# Patient Record
Sex: Male | Born: 1945 | Race: Black or African American | Hispanic: No | Marital: Married | Smoking: Former smoker
Health system: Southern US, Community
[De-identification: ages and names within clinical notes are randomized; demographics above are authoritative.]

## PROBLEM LIST (undated history)

## (undated) DIAGNOSIS — M2550 Pain in unspecified joint: Secondary | ICD-10-CM

## (undated) DIAGNOSIS — M549 Dorsalgia, unspecified: Secondary | ICD-10-CM

## (undated) DIAGNOSIS — E119 Type 2 diabetes mellitus without complications: Secondary | ICD-10-CM

## (undated) DIAGNOSIS — E785 Hyperlipidemia, unspecified: Secondary | ICD-10-CM

## (undated) DIAGNOSIS — J45909 Unspecified asthma, uncomplicated: Secondary | ICD-10-CM

## (undated) DIAGNOSIS — T7840XA Allergy, unspecified, initial encounter: Secondary | ICD-10-CM

## (undated) DIAGNOSIS — Z91018 Allergy to other foods: Secondary | ICD-10-CM

## (undated) DIAGNOSIS — R7303 Prediabetes: Secondary | ICD-10-CM

## (undated) DIAGNOSIS — Z87442 Personal history of urinary calculi: Secondary | ICD-10-CM

## (undated) DIAGNOSIS — M1990 Unspecified osteoarthritis, unspecified site: Secondary | ICD-10-CM

## (undated) DIAGNOSIS — G4730 Sleep apnea, unspecified: Secondary | ICD-10-CM

## (undated) DIAGNOSIS — I739 Peripheral vascular disease, unspecified: Secondary | ICD-10-CM

## (undated) DIAGNOSIS — I10 Essential (primary) hypertension: Secondary | ICD-10-CM

## (undated) DIAGNOSIS — R9439 Abnormal result of other cardiovascular function study: Secondary | ICD-10-CM

## (undated) HISTORY — DX: Unspecified asthma, uncomplicated: J45.909

## (undated) HISTORY — DX: Hyperlipidemia, unspecified: E78.5

## (undated) HISTORY — DX: Unspecified osteoarthritis, unspecified site: M19.90

## (undated) HISTORY — DX: Essential (primary) hypertension: I10

## (undated) HISTORY — DX: Allergy to other foods: Z91.018

## (undated) HISTORY — DX: Sleep apnea, unspecified: G47.30

## (undated) HISTORY — DX: Dorsalgia, unspecified: M54.9

## (undated) HISTORY — DX: Peripheral vascular disease, unspecified: I73.9

## (undated) HISTORY — DX: Pain in unspecified joint: M25.50

## (undated) HISTORY — DX: Prediabetes: R73.03

## (undated) HISTORY — DX: Allergy, unspecified, initial encounter: T78.40XA

---
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
## 2004-06-12 ENCOUNTER — Ambulatory Visit (HOSPITAL_COMMUNITY): Admission: RE | Admit: 2004-06-12 | Discharge: 2004-06-12 | Payer: Self-pay | Admitting: Internal Medicine

## 2004-11-30 ENCOUNTER — Ambulatory Visit: Payer: Self-pay | Admitting: Internal Medicine

## 2004-12-14 ENCOUNTER — Encounter (INDEPENDENT_AMBULATORY_CARE_PROVIDER_SITE_OTHER): Payer: Self-pay | Admitting: *Deleted

## 2004-12-14 ENCOUNTER — Ambulatory Visit: Payer: Self-pay | Admitting: Internal Medicine

## 2009-05-13 ENCOUNTER — Ambulatory Visit (HOSPITAL_COMMUNITY): Admission: RE | Admit: 2009-05-13 | Discharge: 2009-05-13 | Payer: Self-pay | Admitting: Internal Medicine

## 2009-05-18 ENCOUNTER — Ambulatory Visit (HOSPITAL_COMMUNITY): Admission: RE | Admit: 2009-05-18 | Discharge: 2009-05-18 | Payer: Self-pay | Admitting: Internal Medicine

## 2009-12-01 ENCOUNTER — Encounter (INDEPENDENT_AMBULATORY_CARE_PROVIDER_SITE_OTHER): Payer: Self-pay | Admitting: *Deleted

## 2010-03-23 ENCOUNTER — Ambulatory Visit: Payer: Self-pay | Admitting: Internal Medicine

## 2010-03-23 ENCOUNTER — Observation Stay (HOSPITAL_COMMUNITY): Admission: EM | Admit: 2010-03-23 | Discharge: 2010-03-25 | Payer: Self-pay | Admitting: Emergency Medicine

## 2010-03-24 ENCOUNTER — Encounter (INDEPENDENT_AMBULATORY_CARE_PROVIDER_SITE_OTHER): Payer: Self-pay | Admitting: Internal Medicine

## 2010-03-24 ENCOUNTER — Encounter (INDEPENDENT_AMBULATORY_CARE_PROVIDER_SITE_OTHER): Payer: Self-pay | Admitting: Emergency Medicine

## 2010-07-25 NOTE — Letter (Signed)
Summary: Colonoscopy Letter  [facility]  [address] [place], [place] [address]   Phone: [phone]  Fax: [phone]      December 01, 2009 MRN: [id number]   David Brandt [address] [place], [place]  [address]   Dear [name],   According to your medical record, it is time for you to schedule a Colonoscopy. The American Cancer Society recommends this procedure as a method to detect early colon cancer. Patients with a family history of colon cancer, or a personal history of colon polyps or inflammatory bowel disease are at increased risk.  This letter has beeen generated based on the recommendations made at the time of your procedure. If you feel that in your particular situation this may no longer apply, please contact our office.  Please call our office at [phone] to schedule this appointment or to update your records at your earliest convenience.  Thank you for cooperating with [name] to provide you with the very best care possible.   Sincerely,   Iva Boop, M.D.  [facility] Gastroenterology Division [phone]

## 2010-09-07 LAB — COMPREHENSIVE METABOLIC PANEL
ALT: 20 U/L (ref 0–53)
AST: 22 U/L (ref 0–37)
Albumin: 3.4 g/dL — ABNORMAL LOW (ref 3.5–5.2)
Alkaline Phosphatase: 82 U/L (ref 39–117)
BUN: 11 mg/dL (ref 6–23)
CO2: 23 mEq/L (ref 19–32)
Calcium: 8.7 mg/dL (ref 8.4–10.5)
Chloride: 105 mEq/L (ref 96–112)
Creatinine, Ser: 1.17 mg/dL (ref 0.4–1.5)
Glucose, Bld: 92 mg/dL (ref 70–99)
Potassium: 3.2 mEq/L — ABNORMAL LOW (ref 3.5–5.1)
Sodium: 135 mEq/L (ref 135–145)
Total Bilirubin: 0.5 mg/dL (ref 0.3–1.2)
Total Protein: 6.3 g/dL (ref 6.0–8.3)

## 2010-09-07 LAB — LIPASE, BLOOD: Lipase: 36 U/L (ref 11–59)

## 2010-09-07 LAB — URINALYSIS, ROUTINE W REFLEX MICROSCOPIC
Bilirubin Urine: NEGATIVE
Glucose, UA: NEGATIVE mg/dL
Ketones, ur: NEGATIVE mg/dL
Nitrite: NEGATIVE
Protein, ur: NEGATIVE mg/dL
Specific Gravity, Urine: 1.008 (ref 1.005–1.030)
Urobilinogen, UA: 1 mg/dL (ref 0.0–1.0)
pH: 6.5 (ref 5.0–8.0)

## 2010-09-07 LAB — DIFFERENTIAL
Basophils Absolute: 0 10*3/uL (ref 0.0–0.1)
Basophils Relative: 0 % (ref 0–1)
Eosinophils Absolute: 0.3 10*3/uL (ref 0.0–0.7)
Eosinophils Relative: 2 % (ref 0–5)
Lymphocytes Relative: 20 % (ref 12–46)
Lymphs Abs: 2.5 10*3/uL (ref 0.7–4.0)
Monocytes Absolute: 0.9 10*3/uL (ref 0.1–1.0)
Monocytes Relative: 7 % (ref 3–12)
Neutro Abs: 9.2 10*3/uL — ABNORMAL HIGH (ref 1.7–7.7)
Neutrophils Relative %: 71 % (ref 43–77)

## 2010-09-07 LAB — CBC
HCT: 47.9 % (ref 39.0–52.0)
HCT: 48.9 % (ref 39.0–52.0)
Hemoglobin: 16.1 g/dL (ref 13.0–17.0)
Hemoglobin: 16.4 g/dL (ref 13.0–17.0)
MCH: 30.1 pg (ref 26.0–34.0)
MCH: 30.3 pg (ref 26.0–34.0)
MCHC: 33.5 g/dL (ref 30.0–36.0)
MCHC: 33.6 g/dL (ref 30.0–36.0)
MCV: 89.5 fL (ref 78.0–100.0)
MCV: 90.2 fL (ref 78.0–100.0)
Platelets: 201 10*3/uL (ref 150–400)
Platelets: 207 10*3/uL (ref 150–400)
RBC: 5.35 MIL/uL (ref 4.22–5.81)
RBC: 5.42 MIL/uL (ref 4.22–5.81)
RDW: 14.9 % (ref 11.5–15.5)
RDW: 15.2 % (ref 11.5–15.5)
WBC: 12.9 10*3/uL — ABNORMAL HIGH (ref 4.0–10.5)
WBC: 13.8 10*3/uL — ABNORMAL HIGH (ref 4.0–10.5)

## 2010-09-07 LAB — URINE CULTURE
Colony Count: NO GROWTH
Culture  Setup Time: 201109290010
Culture: NO GROWTH

## 2010-09-07 LAB — TSH: TSH: 0.928 u[IU]/mL (ref 0.350–4.500)

## 2010-09-07 LAB — CARDIAC PANEL(CRET KIN+CKTOT+MB+TROPI)
CK, MB: 2.9 ng/mL (ref 0.3–4.0)
CK, MB: 3.8 ng/mL (ref 0.3–4.0)
Relative Index: 2.9 — ABNORMAL HIGH (ref 0.0–2.5)
Relative Index: 3.1 — ABNORMAL HIGH (ref 0.0–2.5)
Total CK: 100 U/L (ref 7–232)
Total CK: 122 U/L (ref 7–232)
Troponin I: 0.02 ng/mL (ref 0.00–0.06)
Troponin I: 0.03 ng/mL (ref 0.00–0.06)

## 2010-09-07 LAB — BASIC METABOLIC PANEL
BUN: 8 mg/dL (ref 6–23)
CO2: 24 mEq/L (ref 19–32)
Calcium: 8.5 mg/dL (ref 8.4–10.5)
Chloride: 109 mEq/L (ref 96–112)
Creatinine, Ser: 0.97 mg/dL (ref 0.4–1.5)
GFR calc Af Amer: >60 mL/min (ref >60)
GFR calc non Af Amer: >60 mL/min (ref >60)
Glucose, Bld: 90 mg/dL (ref 70–99)
Potassium: 3.9 mEq/L (ref 3.5–5.1)
Sodium: 139 mEq/L (ref 135–145)

## 2010-09-07 LAB — URINE MICROSCOPIC-ADD ON

## 2010-09-07 LAB — CK TOTAL AND CKMB (NOT AT ARMC)
CK, MB: 3.7 ng/mL (ref 0.3–4.0)
Relative Index: 2.5 (ref 0.0–2.5)
Total CK: 147 U/L (ref 7–232)

## 2010-09-07 LAB — POCT CARDIAC MARKERS
CKMB, poc: <1 ng/mL — ABNORMAL LOW (ref 1.0–8.0)
Myoglobin, poc: 98.1 ng/mL (ref 12–200)
Troponin i, poc: <0.05 ng/mL (ref 0.00–0.09)

## 2010-09-07 LAB — TROPONIN I: Troponin I: 0.01 ng/mL (ref 0.00–0.06)

## 2010-09-07 LAB — ETHANOL: Alcohol, Ethyl (B): 52 mg/dL — ABNORMAL HIGH (ref 0–10)

## 2010-09-07 LAB — MAGNESIUM: Magnesium: 2.1 mg/dL (ref 1.5–2.5)

## 2010-09-07 LAB — GLUCOSE, CAPILLARY: Glucose-Capillary: 96 mg/dL (ref 70–99)

## 2010-09-07 LAB — T4, FREE: Free T4: 0.95 ng/dL (ref 0.80–1.80)

## 2010-09-08 ENCOUNTER — Ambulatory Visit (HOSPITAL_COMMUNITY): Payer: 59

## 2010-09-08 ENCOUNTER — Ambulatory Visit (HOSPITAL_COMMUNITY)
Admission: RE | Admit: 2010-09-08 | Discharge: 2010-09-08 | Disposition: A | Discharge status: Home / Self Care | Payer: 59 | Source: Ambulatory Visit | Attending: Family Medicine | Admitting: Family Medicine

## 2010-09-08 ENCOUNTER — Other Ambulatory Visit: Payer: Self-pay | Admitting: Family Medicine

## 2010-09-08 ENCOUNTER — Inpatient Hospital Stay (INDEPENDENT_AMBULATORY_CARE_PROVIDER_SITE_OTHER)
Admission: RE | Admit: 2010-09-08 | Discharge: 2010-09-08 | Disposition: A | Discharge status: Home / Self Care | Payer: 59 | Source: Ambulatory Visit | Attending: Family Medicine | Admitting: Family Medicine

## 2010-09-08 DIAGNOSIS — R05 Cough: Secondary | ICD-10-CM | POA: Insufficient documentation

## 2010-09-08 DIAGNOSIS — R059 Cough, unspecified: Secondary | ICD-10-CM | POA: Insufficient documentation

## 2010-09-08 DIAGNOSIS — R0602 Shortness of breath: Secondary | ICD-10-CM | POA: Insufficient documentation

## 2010-09-08 DIAGNOSIS — J40 Bronchitis, not specified as acute or chronic: Secondary | ICD-10-CM

## 2010-09-10 ENCOUNTER — Inpatient Hospital Stay (INDEPENDENT_AMBULATORY_CARE_PROVIDER_SITE_OTHER)
Admission: RE | Admit: 2010-09-10 | Discharge: 2010-09-10 | Disposition: A | Discharge status: Home / Self Care | Payer: 59 | Source: Ambulatory Visit | Attending: Family Medicine | Admitting: Family Medicine

## 2010-09-10 ENCOUNTER — Ambulatory Visit (INDEPENDENT_AMBULATORY_CARE_PROVIDER_SITE_OTHER): Payer: 59

## 2010-09-10 DIAGNOSIS — J45909 Unspecified asthma, uncomplicated: Secondary | ICD-10-CM

## 2010-10-09 ENCOUNTER — Other Ambulatory Visit: Payer: Self-pay | Admitting: Internal Medicine

## 2010-10-09 ENCOUNTER — Ambulatory Visit (HOSPITAL_COMMUNITY)
Admission: RE | Admit: 2010-10-09 | Discharge: 2010-10-09 | Disposition: A | Discharge status: Home / Self Care | Payer: 59 | Source: Ambulatory Visit | Attending: Internal Medicine | Admitting: Internal Medicine

## 2010-10-09 DIAGNOSIS — J189 Pneumonia, unspecified organism: Secondary | ICD-10-CM | POA: Insufficient documentation

## 2010-10-09 DIAGNOSIS — J438 Other emphysema: Secondary | ICD-10-CM | POA: Insufficient documentation

## 2010-10-09 DIAGNOSIS — F17200 Nicotine dependence, unspecified, uncomplicated: Secondary | ICD-10-CM | POA: Insufficient documentation

## 2010-10-09 DIAGNOSIS — I10 Essential (primary) hypertension: Secondary | ICD-10-CM | POA: Insufficient documentation

## 2010-10-11 ENCOUNTER — Ambulatory Visit (HOSPITAL_BASED_OUTPATIENT_CLINIC_OR_DEPARTMENT_OTHER): Payer: 59 | Attending: Internal Medicine

## 2010-10-11 DIAGNOSIS — G4733 Obstructive sleep apnea (adult) (pediatric): Secondary | ICD-10-CM | POA: Insufficient documentation

## 2010-10-14 DIAGNOSIS — G4733 Obstructive sleep apnea (adult) (pediatric): Secondary | ICD-10-CM

## 2010-10-14 NOTE — Procedures (Signed)
NAME:  David Brandt, David Brandt NO.:  [id number]  MEDICAL RECORD NO.:  [id number]          PATIENT TYPE:  OUT  LOCATION:  SLEEP CENTER                 FACILITY:  [facility]  PHYSICIAN:  Isom Kochan D. Maple Hudson, MD, FCCP, FACPDATE OF BIRTH:  07-01-1947  DATE OF STUDY:  10/11/2010                           NOCTURNAL POLYSOMNOGRAM  REFERRING PHYSICIAN:  PRESTON S CLARK  INDICATION FOR STUDY:  Hypersomnia with sleep apnea.  EPWORTH SLEEPINESS SCORE:  3/24, BMI 34.6.  Weight 241 pounds, height 70 inches.  Neck 17 inches.  MEDICATIONS:  Home medications are charted and reviewed.  SLEEP ARCHITECTURE:  Split study protocol.  During the diagnostic phase, total sleep time 127 minutes with sleep efficiency 85.5%.  Stage I was 8.7%, stage II 82.7%, stage III absent, REM 8.7% of total sleep time. Sleep latency 5 minutes, REM latency 74 minutes, awake after sleep onset 16.5 minutes, arousal index 68.5.  Bedtime medication:  None.  RESPIRATORY DATA:  Split study protocol.  Apnea/hypopnea index (AHI) 115.7 per hour.  A total of 245 events was scored including 232 obstructive apneas, 12 mixed apneas, one hypopnea.  Events were not positional.  REM AHI 76.4.  CPAP was then titrated to 15 mL CWP, AHI 0.6 per hour.  He wore a medium ResMed Mirage Quattro full-face mask with heated humidifier.  OXYGEN DATA:  Before CPAP, snoring was moderate with oxygen desaturation to a nadir of 72% on room air.  With CPAP titration snoring was prevented and mean oxygen saturation held 92.5% on room air.  CARDIAC DATA:  Sinus rhythm.  MOVEMENT/PARASOMNIA:  No limb movement noted during the diagnostic phase.  During the titration phase, there were frequent limb jerks but very few associated with sleep disturbance.  Bathroom times one.  IMPRESSION/RECOMMENDATIONS: 1. Severe obstructive sleep apnea/hypopnea syndrome, AHI of 115.7 per     hour.  Non positional events.  Moderate snoring with oxygen     desaturation  to a nadir of 72% on room air. 2. Successful CPAP titration to 15 mL CWP, AHI 0.6 per hour.  He wore     a medium ResMed Mirage Quattro full-face mask with heated     humidifier.  Snoring was prevented and oxygenation normalized.     Hadley Soileau D. Maple Hudson, MD, [facility], FACP Diplomate, Biomedical engineer of Sleep Medicine Electronically Signed    CDY/MEDQ  D:  10/14/2010 09:21:45  T:  10/14/2010 19:21:33  Job:  [id number]

## 2010-11-16 ENCOUNTER — Encounter: Payer: Self-pay | Admitting: Pulmonary Disease

## 2010-11-28 ENCOUNTER — Encounter: Payer: Self-pay | Admitting: Pulmonary Disease

## 2010-11-28 ENCOUNTER — Ambulatory Visit (INDEPENDENT_AMBULATORY_CARE_PROVIDER_SITE_OTHER): Payer: 59 | Admitting: Pulmonary Disease

## 2010-11-28 VITALS — BP 140/80 | HR 83 | Temp 98.1°F | Ht 70.0 in | Wt 247.6 lb

## 2010-11-28 DIAGNOSIS — G4733 Obstructive sleep apnea (adult) (pediatric): Secondary | ICD-10-CM

## 2010-11-28 NOTE — Patient Instructions (Signed)
Will start on cpap and increase pressure over next 5 weeks. Please call me if having tolerance issues. Work on weight loss followup with me in 5 weeks

## 2010-11-28 NOTE — Progress Notes (Signed)
  Subjective:    Patient ID: David Brandt, male    DOB: 08-13-1945, 65 y.o.   MRN: [id number]  HPI The pt is a 65y/o male who I have been asked to see for management of osa.  He recently underwent npsg which revealed severe osa, with AHI 116/hr and optimal cpap pressure of 15cm.  His history is significant for: -noted to have loud snoring and abnormal breathing pattern during sleep -frequent awakenings and nonrestorative sleep -significant daytime sleepiness with periods of inactivity (falls asleep drinking coffee, with tv/movies) -50 pound weight gain last 2 yrs.   Sleep Questionnaire: What time do you typically go to bed?( Between what hours) 11 to 11:30pm How long does it take you to fall asleep? 20 mins How many times during the night do you wake up? What time do you get out of bed to start your day? 0800 Do you drive or operate heavy machinery in your occupation? No How much has your weight changed (up or down) over the past two years? (In pounds) 50 lb (22.68 kg) Have you ever had a sleep study before? Yes If yes, location of study? [facility] If yes, date of study? 09/2010 Do you currently use CPAP? No Do you wear oxygen at any time? No    Review of Systems  Constitutional: Negative for fever and unexpected weight change.  HENT: Positive for congestion. Negative for ear pain, nosebleeds, sore throat, rhinorrhea, sneezing, trouble swallowing, dental problem, postnasal drip and sinus pressure.   Eyes: Negative for redness and itching.  Respiratory: Positive for shortness of breath. Negative for cough, chest tightness and wheezing.   Cardiovascular: Negative for palpitations and leg swelling.  Gastrointestinal: Negative for nausea and vomiting.  Genitourinary: Negative for dysuria.  Musculoskeletal: Negative for joint swelling.  Skin: Negative for rash.  Neurological: Negative for headaches.  Hematological: Does not bruise/bleed easily.  Psychiatric/Behavioral: Negative for dysphoric mood.  The patient is not nervous/anxious.        Objective:   Physical Exam Constitutional: obese male, no acute distress  HENT:  Nares with deviated septum to left with near obstruction, enlarged turbinates.   Oropharynx without exudate, palate and uvula are very large posteriorly  Eyes:  Perrla, eomi, no scleral icterus  Neck:  No JVD, no TMG  Cardiovascular:  Normal rate, regular rhythm, no rubs or gallops.  No murmurs        Intact distal pulses  Pulmonary :  Normal breath sounds, no stridor or respiratory distress   No rales, rhonchi, or wheezing  Abdominal:  Soft, nondistended, bowel sounds present.  No tenderness noted.   Musculoskeletal:  No lower extremity edema noted.  Lymph Nodes:  No cervical lymphadenopathy noted  Skin:  No cyanosis noted  Neurologic:  Alert, but does appear mildly sleepy, moves all 4 extremities without obvious deficit.         Assessment & Plan:

## 2010-11-28 NOTE — Assessment & Plan Note (Addendum)
The pt has very severe osa by his recent sleep study, but had good control on appropriate cpap.  He is clearly symptomatic at night and during the day, with significant impact on QOL.  I have had a long discussion with the pt about sleep apnea, including its impact on QOL and CV health.  I have recommended treatment with cpap while working on weight loss, and he is agreeable.

## 2010-12-04 ENCOUNTER — Encounter: Payer: Self-pay | Admitting: Internal Medicine

## 2010-12-08 ENCOUNTER — Encounter: Payer: Self-pay | Admitting: Pulmonary Disease

## 2011-01-02 ENCOUNTER — Encounter: Payer: Self-pay | Admitting: Pulmonary Disease

## 2011-01-02 ENCOUNTER — Ambulatory Visit (INDEPENDENT_AMBULATORY_CARE_PROVIDER_SITE_OTHER): Payer: 59 | Admitting: Pulmonary Disease

## 2011-01-02 VITALS — BP 144/86 | HR 93 | Temp 98.2°F | Ht 70.0 in | Wt 249.4 lb

## 2011-01-02 DIAGNOSIS — G4733 Obstructive sleep apnea (adult) (pediatric): Secondary | ICD-10-CM

## 2011-01-02 NOTE — Assessment & Plan Note (Signed)
The pt is doing well on cpap, and we now need to get him to his therapeutic pressure.  He is having no issue with mask fit or pressure tolerance.  Will send an order to his dme to increase his cpap to 15cm.  I have encouraged him to work aggressively on weight loss, and will see him back in 6mos.

## 2011-01-02 NOTE — Patient Instructions (Signed)
Will have your pressure increased to 15. Work on weight loss followup with me in 6mos, but call if having issues.

## 2011-01-02 NOTE — Progress Notes (Signed)
  Subjective:    Patient ID: David Brandt, male    DOB: 05-20-1946, 65 y.o.   MRN: [id number]  HPI The pt comes in today for f/u of his known very severe osa.  He has been started on cpap, and is wearing compliantly.  He denies any issues with mask fit or pressure.  He is sleeping much better, and feels refreshed the next am.  He is not dozing during the day with inactivity.  He is pleased with progress so far.     Review of Systems  Constitutional: Negative for fever and unexpected weight change.  HENT: Positive for congestion. Negative for ear pain, nosebleeds, sore throat, rhinorrhea, sneezing, trouble swallowing, dental problem, postnasal drip and sinus pressure.   Eyes: Negative for redness and itching.  Respiratory: Positive for cough, chest tightness, shortness of breath and wheezing.   Cardiovascular: Negative for palpitations and leg swelling.  Gastrointestinal: Negative for nausea and vomiting.  Genitourinary: Negative for dysuria.  Musculoskeletal: Negative for joint swelling.  Skin: Negative for rash.  Neurological: Negative for headaches.  Hematological: Does not bruise/bleed easily.  Psychiatric/Behavioral: Negative for dysphoric mood. The patient is not nervous/anxious.        Objective:   Physical Exam Obese male in nad No skin breakdown or pressure necrosis from cpap mask LE with mild edema, no cyanosis  Alert, oriented, moves all 4        Assessment & Plan:

## 2011-07-05 ENCOUNTER — Encounter: Payer: Self-pay | Admitting: Pulmonary Disease

## 2011-07-05 ENCOUNTER — Ambulatory Visit (INDEPENDENT_AMBULATORY_CARE_PROVIDER_SITE_OTHER): Payer: 59 | Admitting: Pulmonary Disease

## 2011-07-05 VITALS — BP 122/76 | HR 87 | Temp 98.1°F | Ht 70.0 in | Wt 261.0 lb

## 2011-07-05 DIAGNOSIS — G4733 Obstructive sleep apnea (adult) (pediatric): Secondary | ICD-10-CM

## 2011-07-05 NOTE — Assessment & Plan Note (Signed)
The patient is doing very well on CPAP at his optimal pressure.  He is sleeping well and has excellent daytime alertness.  I have encouraged him to work aggressively on weight loss, and to keep up with his mask and supplies over the next one year.

## 2011-07-05 NOTE — Progress Notes (Signed)
  Subjective:    Patient ID: David Brandt, male    DOB: Jul 30, 1945, 66 y.o.   MRN: [id number]  HPI Patient comes in today for followup of his obstructive sleep apnea.  He is wearing CPAP compliantly, and feels that he sleeps well with excellent daytime alertness.  His CPAP machine has been set to its optimal pressure, and he is having no issues with his mask.  He is also working on weight loss with a daily exercise program.   Review of Systems  Constitutional: Negative for fever and unexpected weight change.  HENT: Positive for congestion and rhinorrhea. Negative for ear pain, nosebleeds, sore throat, sneezing, trouble swallowing, dental problem, postnasal drip and sinus pressure.   Eyes: Negative for redness and itching.  Respiratory: Positive for chest tightness and wheezing. Negative for cough and shortness of breath.   Cardiovascular: Negative for palpitations and leg swelling.  Gastrointestinal: Negative for nausea and vomiting.  Genitourinary: Negative for dysuria.  Musculoskeletal: Negative for joint swelling.  Skin: Negative for rash.  Neurological: Negative for headaches.  Hematological: Does not bruise/bleed easily.  Psychiatric/Behavioral: Negative for dysphoric mood. The patient is not nervous/anxious.        Objective:   Physical Exam Overweight male in no acute distress No skin breakdown or pressure necrosis from the CPAP mask Lower extremities with mild edema, no cyanosis noted Alert and oriented, moves all 4 extremities.       Assessment & Plan:

## 2011-07-05 NOTE — Patient Instructions (Signed)
Continue on cpap and your exercise program. Keep up with mask changes and supplies followup with me in 1 year if doing well.

## 2011-07-16 ENCOUNTER — Encounter: Payer: Self-pay | Admitting: Internal Medicine

## 2011-07-16 ENCOUNTER — Telehealth: Payer: Self-pay | Admitting: Gastroenterology

## 2011-07-16 NOTE — Telephone Encounter (Signed)
Called the patient and left a message on voicemail for the patient to return my call.

## 2011-07-16 NOTE — Telephone Encounter (Signed)
Patient called back and scheduled pre-visit and colonoscopy for 08/14/11 and 08/28/11.

## 2011-08-14 ENCOUNTER — Ambulatory Visit (AMBULATORY_SURGERY_CENTER): Payer: 59 | Admitting: *Deleted

## 2011-08-14 VITALS — Ht 70.0 in | Wt 254.9 lb

## 2011-08-14 DIAGNOSIS — Z1211 Encounter for screening for malignant neoplasm of colon: Secondary | ICD-10-CM

## 2011-08-14 MED ORDER — PEG-KCL-NACL-NASULF-NA ASC-C 100 G PO SOLR: ORAL | Status: DC

## 2011-08-28 ENCOUNTER — Ambulatory Visit (AMBULATORY_SURGERY_CENTER): Payer: 59 | Admitting: Internal Medicine

## 2011-08-28 ENCOUNTER — Encounter: Payer: Self-pay | Admitting: Internal Medicine

## 2011-08-28 VITALS — BP 145/87 | HR 81 | Temp 97.1°F | Resp 16 | Ht 70.0 in | Wt 254.0 lb

## 2011-08-28 DIAGNOSIS — D128 Benign neoplasm of rectum: Secondary | ICD-10-CM

## 2011-08-28 DIAGNOSIS — D129 Benign neoplasm of anus and anal canal: Secondary | ICD-10-CM

## 2011-08-28 DIAGNOSIS — D126 Benign neoplasm of colon, unspecified: Secondary | ICD-10-CM

## 2011-08-28 DIAGNOSIS — Z1211 Encounter for screening for malignant neoplasm of colon: Secondary | ICD-10-CM

## 2011-08-28 DIAGNOSIS — K5730 Diverticulosis of large intestine without perforation or abscess without bleeding: Secondary | ICD-10-CM

## 2011-08-28 MED ORDER — SODIUM CHLORIDE 0.9 % IV SOLN: 500.0000 mL | INTRAVENOUS | Status: DC

## 2011-08-28 NOTE — Patient Instructions (Addendum)

## 2011-08-28 NOTE — Progress Notes (Signed)
Patient did not experience any of the following events: a burn prior to discharge; a fall within the facility; wrong site/side/patient/procedure/implant event; or a hospital transfer or hospital admission upon discharge from the facility. (G8907) Patient did not have preoperative order for IV antibiotic SSI prophylaxis. (G8918)  

## 2011-08-28 NOTE — Op Note (Signed)
[facility] [address] [place], [place]  [address]  COLONOSCOPY PROCEDURE REPORT  PATIENT:  David Brandt, David Brandt  MR#:  [id number] BIRTHDATE:  22-Nov-1945, 65 yrs. old  GENDER:  male ENDOSCOPIST:  Iva Boop, MD, [facility]  PROCEDURE DATE:  08/28/2011 PROCEDURE:  Colonoscopy with biopsy and snare polypectomy ASA CLASS:  Class III INDICATIONS:  Routine Risk Screening elderly father did have colon cancer (age72) MEDICATIONS:   These medications were titrated to patient response per physician's verbal order, Fentanyl 75 mcg IV, Versed 7 mg IV  DESCRIPTION OF PROCEDURE:   After the risks benefits and alternatives of the procedure were thoroughly explained, informed consent was obtained.  Digital rectal exam was performed and revealed no abnormalities and normal prostate.   The LB CF-[id number] endoscope was introduced through the anus and advanced to the cecum, which was identified by both the appendix and ileocecal valve, without limitations.  The quality of the prep was excellent, using MoviPrep.  The instrument was then slowly withdrawn as the colon was fully examined. <<PROCEDUREIMAGES>>  FINDINGS:  There were multiple polyps identified and removed. Nine polyps removed. Cecum - 4 maximum size 6 mm removed with cold snare and biopsy. Ascending 1 diminutive removed cold snare. Transverse two diminutive removed cold snare. Sigmoid 1 diminutive removed cold snare. Rectum 8-10 mm removed hot snare. All sent to pathology.  Moderate diverticulosis was found in the sigmoid colon.  This was otherwise a normal examination of the colon. Retroflexed views in the rectum revealed no abnormalities.    The time to cecum = 6:20 minutes. The scope was then withdrawn in 20:36 minutes from the cecum and the procedure completed. COMPLICATIONS:  None ENDOSCOPIC IMPRESSION: 1) Polyps, multiple removed - Nine total, largest 8-10 mm 2) Moderate diverticulosis in the sigmoid colon 3)  Otherwise normal , excellent prep  RECOMMENDATIONS: No aspirin or NSAID's for 1 week  REPEAT EXAM:  In for Colonoscopy, pending biopsy results. likely 3 years  Iva Boop, MD, [facility]  CC:  Margaretmary Bayley, MD and The Patient  n. eSIGNED:   Iva Boop at 08/28/2011 12:28 PM  [name], [phone]

## 2011-08-29 ENCOUNTER — Telehealth: Payer: Self-pay | Admitting: *Deleted

## 2011-08-29 NOTE — Telephone Encounter (Signed)
  Follow up Call-  Call back number 08/28/2011  Post procedure Call Back phone  # [phone]  Permission to leave phone message Yes     Patient questions:  Do you have a fever, pain , or abdominal swelling? no Pain Score  0 *  Have you tolerated food without any problems? yes  Have you been able to return to your normal activities? yes  Do you have any questions about your discharge instructions: Diet   no Medications  no Follow up visit  no  Do you have questions or concerns about your Care? yes  Actions: * If pain score is 4 or above: No action needed, pain <4.  Pt states that he sees a very scant amount of blood when he wipes. Pt states that he is having no fever, pain, or abdominal swelling. Instructed pt to monitor this closely and if anything changes, especially is he sees a large amount of blood, to let us know immediately. Pt verbalized understanding.

## 2011-09-04 ENCOUNTER — Encounter: Payer: Self-pay | Admitting: Internal Medicine

## 2011-09-04 NOTE — Progress Notes (Signed)
Quick Note:  Multiple adenomas (5+) largest 8-10 mm Routine repeat colonoscopy about 08/2014 - 3 years ______

## 2011-11-13 ENCOUNTER — Other Ambulatory Visit: Payer: Self-pay | Admitting: Dermatology

## 2012-07-04 ENCOUNTER — Encounter: Payer: Self-pay | Admitting: Pulmonary Disease

## 2012-07-04 ENCOUNTER — Ambulatory Visit (INDEPENDENT_AMBULATORY_CARE_PROVIDER_SITE_OTHER): Payer: 59 | Admitting: Pulmonary Disease

## 2012-07-04 VITALS — BP 160/102 | HR 89 | Temp 98.3°F | Ht 70.0 in | Wt 273.4 lb

## 2012-07-04 DIAGNOSIS — G4733 Obstructive sleep apnea (adult) (pediatric): Secondary | ICD-10-CM

## 2012-07-04 NOTE — Assessment & Plan Note (Signed)
The patient seems to be doing well from a sleep apnea standpoint.  He is having no issues with his equipment, and feels that he sleeps well.  He has gained 12 pounds since the last visit, and I have encouraged him to work aggressively on weight loss.  I have also asked him to keep up with his mask changes and supplies.

## 2012-07-04 NOTE — Progress Notes (Signed)
  Subjective:    Patient ID: David Brandt, male    DOB: June 02, 1946, 67 y.o.   MRN: [id number]  HPI The patient comes in today for followup of his known obstructive sleep apnea.  He is wearing CPAP compliantly, and is having no issues with his mask fit or pressure.  He feels that he sleeps well, and is satisfied with his daytime alertness.  Of note, his weight has increased 12 pounds since the last visit.   Review of Systems  Constitutional: Negative for fever and unexpected weight change.  HENT: Positive for congestion and sinus pressure. Negative for ear pain, nosebleeds, sore throat, rhinorrhea, sneezing, trouble swallowing, dental problem and postnasal drip.   Eyes: Negative for redness and itching.  Respiratory: Negative for cough, chest tightness, shortness of breath and wheezing.   Cardiovascular: Negative for palpitations and leg swelling.  Gastrointestinal: Negative for nausea and vomiting.  Genitourinary: Negative for dysuria.  Musculoskeletal: Negative for joint swelling.  Skin: Negative for rash.  Neurological: Negative for headaches.  Hematological: Does not bruise/bleed easily.  Psychiatric/Behavioral: Negative for dysphoric mood. The patient is not nervous/anxious.        Objective:   Physical Exam Obese male in no acute distress Nose with purulent discharge noted No skin breakdown or pressure necrosis from the CPAP mask Lower extremities mild edema, no cyanosis Alert and oriented, moves all 4 extremities.       Assessment & Plan:

## 2012-07-04 NOTE — Patient Instructions (Addendum)
Continue with cpap, and work on weight loss. Keep up with mask changes and supplies. followup with me in one year.

## 2013-07-06 ENCOUNTER — Ambulatory Visit (INDEPENDENT_AMBULATORY_CARE_PROVIDER_SITE_OTHER): Payer: Medicare HMO | Admitting: Pulmonary Disease

## 2013-07-06 ENCOUNTER — Encounter: Payer: Self-pay | Admitting: Pulmonary Disease

## 2013-07-06 VITALS — BP 148/84 | HR 79 | Temp 97.8°F | Ht 70.0 in | Wt 280.6 lb

## 2013-07-06 DIAGNOSIS — G4733 Obstructive sleep apnea (adult) (pediatric): Secondary | ICD-10-CM

## 2013-07-06 NOTE — Progress Notes (Signed)
   Subjective:    Patient ID: David Brandt, male    DOB: 1945/10/27, 68 y.o.   MRN: [id number]  HPI Patient comes in today for followup of his obstructive sleep apnea. He is wearing CPAP compliantly, and has been keeping up with his supplies. He feels that he is sleeping well with the device, and is satisfied with his daytime alertness. Of note, his weight is up 7 pounds since last visit.   Review of Systems  Constitutional: Negative for fever and unexpected weight change.  HENT: Negative for congestion, dental problem, ear pain, nosebleeds, postnasal drip, rhinorrhea, sinus pressure, sneezing, sore throat and trouble swallowing.   Eyes: Negative for redness and itching.  Respiratory: Negative for cough, chest tightness, shortness of breath and wheezing.   Cardiovascular: Negative for palpitations and leg swelling.  Gastrointestinal: Negative for nausea and vomiting.  Genitourinary: Negative for dysuria.  Musculoskeletal: Negative for joint swelling.  Skin: Negative for rash.  Neurological: Negative for headaches.  Hematological: Does not bruise/bleed easily.  Psychiatric/Behavioral: Negative for dysphoric mood. The patient is not nervous/anxious.        Objective:   Physical Exam Morbidly obese male in no acute distress Nose without purulence or discharge noted Neck without lymphadenopathy or thyromegaly No skin breakdown or pressure necrosis from the CPAP mask Lower extremities with edema noted, no cyanosis Alert, does not appear to be sleepy, moves all 4 extremities.       Assessment & Plan:

## 2013-07-06 NOTE — Patient Instructions (Signed)
Continue with cpap, and keep up with mask changes and supplies. Work on weight loss followup with me in one year if doing well.  

## 2013-07-06 NOTE — Assessment & Plan Note (Signed)
The patient is doing well from a CPAP standpoint, and is satisfied with his sleep and daytime alertness. I've asked him to continue with his device, and to keep up with his supplies. I've also encouraged him to work aggressively on weight loss

## 2014-02-08 ENCOUNTER — Encounter: Payer: Self-pay | Admitting: Internal Medicine

## 2014-07-06 ENCOUNTER — Ambulatory Visit (INDEPENDENT_AMBULATORY_CARE_PROVIDER_SITE_OTHER): Payer: Medicare HMO | Admitting: Pulmonary Disease

## 2014-07-06 ENCOUNTER — Encounter: Payer: Self-pay | Admitting: Internal Medicine

## 2014-07-06 ENCOUNTER — Encounter: Payer: Self-pay | Admitting: Pulmonary Disease

## 2014-07-06 VITALS — BP 134/76 | HR 78 | Temp 97.6°F | Ht 70.0 in | Wt 291.6 lb

## 2014-07-06 DIAGNOSIS — G4733 Obstructive sleep apnea (adult) (pediatric): Secondary | ICD-10-CM

## 2014-07-06 NOTE — Assessment & Plan Note (Signed)
The patient is wearing C Pap compliantly, and feels that it helps his sleep and his daytime alertness. His only complaint is that he has some issues first thing in the morning when he arises where he feels short of breath and has cough with mucus production. I have told him this is related to his ongoing smoking, and that he needs to quit. He also has fairly high pressure requirements, and its possible that he may have some underlying obstructive lung disease that may result in some air trapping toward the end of the night. I will change his pressure over to the automatic setting so that we lower his overall mean pressure.

## 2014-07-06 NOTE — Progress Notes (Signed)
   Subjective:    Patient ID: David Brandt, male    DOB: [birth year], 69 y.o.   MRN: [id number]  HPI The patient comes in today for follow-up of his very severe obstructive sleep apnea. He is wearing C Pap compliantly, and is having no issues with his mask fit. He is having some pressure issues first thing in the morning when he arises, and this is related to congestion and cough. The patient is a long-time smoker, and I've told him this is the cause of his early morning mucus production. He is also on a higher pressure level, and he may be having some air trapping related to underlying obstructive disease.   Review of Systems  Constitutional: Negative for fever and unexpected weight change.  HENT: Negative for congestion, dental problem, ear pain, nosebleeds, postnasal drip, rhinorrhea, sinus pressure, sneezing, sore throat and trouble swallowing.   Eyes: Negative for redness and itching.  Respiratory: Positive for shortness of breath. Negative for cough, chest tightness and wheezing.   Cardiovascular: Negative for palpitations and leg swelling.  Gastrointestinal: Negative for nausea and vomiting.  Genitourinary: Negative for dysuria.  Musculoskeletal: Negative for joint swelling.  Skin: Negative for rash.  Neurological: Negative for headaches.  Hematological: Does not bruise/bleed easily.  Psychiatric/Behavioral: Negative for dysphoric mood. The patient is not nervous/anxious.        Objective:   Physical Exam Morbidly obese male in no acute distress Nose without purulence or discharge noted No skin breakdown or pressure necrosis from the C Pap mask Neck without lymphadenopathy or thyromegaly. Lower extremities with edema noted, no cyanosis Alert and oriented, does not appear to be sleepy, moves all 4 extremities.       Assessment & Plan:

## 2014-07-06 NOTE — Patient Instructions (Signed)
Continue on cpap, and will have your machine put on the automatic setting.   Work on [employer] loss You need to quit smoking.  This will decrease your mucus production.  followup with me again in one year.

## 2014-08-11 DIAGNOSIS — M150 Primary generalized (osteo)arthritis: Secondary | ICD-10-CM | POA: Diagnosis not present

## 2014-08-11 DIAGNOSIS — E780 Pure hypercholesterolemia: Secondary | ICD-10-CM | POA: Diagnosis not present

## 2014-08-11 DIAGNOSIS — E291 Testicular hypofunction: Secondary | ICD-10-CM | POA: Diagnosis not present

## 2014-08-11 DIAGNOSIS — I10 Essential (primary) hypertension: Secondary | ICD-10-CM | POA: Diagnosis not present

## 2014-08-30 DIAGNOSIS — J40 Bronchitis, not specified as acute or chronic: Secondary | ICD-10-CM | POA: Diagnosis not present

## 2014-08-30 DIAGNOSIS — M150 Primary generalized (osteo)arthritis: Secondary | ICD-10-CM | POA: Diagnosis not present

## 2014-08-30 DIAGNOSIS — I10 Essential (primary) hypertension: Secondary | ICD-10-CM | POA: Diagnosis not present

## 2014-08-30 DIAGNOSIS — E780 Pure hypercholesterolemia: Secondary | ICD-10-CM | POA: Diagnosis not present

## 2014-08-31 ENCOUNTER — Ambulatory Visit (AMBULATORY_SURGERY_CENTER): Payer: Self-pay | Admitting: *Deleted

## 2014-08-31 VITALS — Ht 70.0 in | Wt 283.2 lb

## 2014-08-31 DIAGNOSIS — Z86010 Personal history of colonic polyps: Secondary | ICD-10-CM

## 2014-08-31 NOTE — Progress Notes (Signed)
Denies allergies to eggs or soy products. Denies complications with sedation or anesthesia. Denies O2 use. Denies use of diet or weight loss medications.  Emmi instructions given for colonoscopy, patient does not have access to a computer.

## 2014-09-14 ENCOUNTER — Ambulatory Visit (AMBULATORY_SURGERY_CENTER): Payer: Medicare PPO | Admitting: Internal Medicine

## 2014-09-14 ENCOUNTER — Encounter: Payer: Self-pay | Admitting: Internal Medicine

## 2014-09-14 VITALS — BP 125/72 | HR 78 | Temp 98.1°F | Resp 17 | Ht 70.0 in | Wt 283.0 lb

## 2014-09-14 DIAGNOSIS — D124 Benign neoplasm of descending colon: Secondary | ICD-10-CM

## 2014-09-14 DIAGNOSIS — K635 Polyp of colon: Secondary | ICD-10-CM

## 2014-09-14 DIAGNOSIS — D120 Benign neoplasm of cecum: Secondary | ICD-10-CM

## 2014-09-14 DIAGNOSIS — Z860101 Personal history of adenomatous and serrated colon polyps: Secondary | ICD-10-CM | POA: Insufficient documentation

## 2014-09-14 DIAGNOSIS — D122 Benign neoplasm of ascending colon: Secondary | ICD-10-CM | POA: Diagnosis not present

## 2014-09-14 DIAGNOSIS — Z86010 Personal history of colonic polyps: Secondary | ICD-10-CM | POA: Diagnosis not present

## 2014-09-14 DIAGNOSIS — D125 Benign neoplasm of sigmoid colon: Secondary | ICD-10-CM

## 2014-09-14 MED ORDER — SODIUM CHLORIDE 0.9 % IV SOLN: 500.0000 mL | INTRAVENOUS | Status: DC

## 2014-09-14 NOTE — Patient Instructions (Addendum)
I found and removed 6 small polyps - all look benign (not cancer). I will let you know pathology results and when to have another routine colonoscopy by mail.  You also have a condition called diverticulosis - common and not usually a problem. Please read the handout provided.  I appreciate the opportunity to care for you. Gatha Mayer, MD, FACG  YOU HAD AN ENDOSCOPIC PROCEDURE TODAY AT [facility]:   Refer to the procedure report that was given to you for any specific questions about what was found during the examination.  If the procedure report does not answer your questions, please call your gastroenterologist to clarify.  If you requested that your care partner not be given the details of your procedure findings, then the procedure report has been included in a sealed envelope for you to review at your convenience later.  YOU SHOULD EXPECT: Some feelings of bloating in the abdomen. Passage of more gas than usual.  Walking can help get rid of the air that was put into your GI tract during the procedure and reduce the bloating. If you had a lower endoscopy (such as a colonoscopy or flexible sigmoidoscopy) you may notice spotting of blood in your stool or on the toilet paper. If you underwent a bowel prep for your procedure, you may not have a normal bowel movement for a few days.  Please Note:  You might notice some irritation and congestion in your nose or some drainage.  This is from the oxygen used during your procedure.  There is no need for concern and it should clear up in a day or so.  SYMPTOMS TO REPORT IMMEDIATELY:   Following lower endoscopy (colonoscopy or flexible sigmoidoscopy):  Excessive amounts of blood in the stool  Significant tenderness or worsening of abdominal pains  Swelling of the abdomen that is new, acute  Fever of 100F or higher  For urgent or emergent issues, a gastroenterologist can be reached at any hour by calling [phone]  [phone].   DIET: Your first meal following the procedure should be a small meal and then it is ok to progress to your normal diet. Heavy or fried foods are harder to digest and may make you feel nauseous or bloated.  Likewise, meals heavy in dairy and vegetables can increase bloating.  Drink plenty of fluids but you should avoid alcoholic beverages for 24 hours.  ACTIVITY:  You should plan to take it easy for the rest of today and you should NOT DRIVE or use heavy machinery until tomorrow (because of the sedation medicines used during the test).    FOLLOW UP: Our staff will call the number listed on your records the next business day following your procedure to check on you and address any questions or concerns that you may have regarding the information given to you following your procedure. If we do not reach you, we will leave a message.  However, if you are feeling well and you are not experiencing any problems, there is no need to return our call.  We will assume that you have returned to your regular daily activities without incident.  If any biopsies were taken you will be contacted by phone or by letter within the next 1-3 weeks.  Please call us at [phone] if you have not heard about the biopsies in 3 weeks.    SIGNATURES/CONFIDENTIALITY: You and/or your care partner have signed paperwork which will be entered into your electronic medical record.  These signatures attest to the fact that that the information above on your After Visit Summary has been reviewed and is understood.  Full responsibility of the confidentiality of this discharge information lies with you and/or your care-partner.  Polyps, diverticulosis-handouts given

## 2014-09-14 NOTE — Progress Notes (Signed)
Called to room to assist during endoscopic procedure.  Patient ID and intended procedure confirmed with present staff. Received instructions for my participation in the procedure from the performing physician.  

## 2014-09-14 NOTE — Op Note (Signed)
[facility]  [address] [place], [id number]   COLONOSCOPY PROCEDURE REPORT  PATIENT: David Brandt, David Brandt  MR#: [id number] BIRTHDATE: 08-30-1945 , 82  yrs. old GENDER: male ENDOSCOPIST: Gatha Mayer, MD, [facility] PROCEDURE DATE:  09/14/2014 PROCEDURE:   Colonoscopy, surveillance , Colonoscopy with biopsy, and Colonoscopy with snare polypectomy First Screening Colonoscopy - Avg.  risk and is 50 yrs.  old or older - No.  Prior Negative Screening - Now for repeat screening. N/A  History of Adenoma - Now for follow-up colonoscopy & has been > or = to 3 yrs.  Yes hx of adenoma.  Has been 3 or more years since last colonoscopy. ASA CLASS:   Class III INDICATIONS:Surveillance due to prior colonic neoplasia and PH Colon Adenoma. MEDICATIONS: Propofol 600 mg IV and Monitored anesthesia care  DESCRIPTION OF PROCEDURE:   After the risks benefits and alternatives of the procedure were thoroughly explained, informed consent was obtained.  The digital rectal exam revealed no abnormalities of the rectum, revealed no prostatic nodules, and revealed the prostate was not enlarged.   The LB [id number] endoscope was introduced through the anus and advanced to the cecum, which was identified by both the appendix and ileocecal valve. No adverse events experienced.   The quality of the prep was good.  (MiraLax was used)  The instrument was then slowly withdrawn as the colon was fully examined.   COLON FINDINGS: Six sessile polyps ranging from 2 to 35mm in size were found in the ascending colon, at the cecum, in the sigmoid colon, and descending colon.  Polypectomies were performed with a cold snare and with cold forceps.  The resection was complete, the polyp tissue was completely retrieved and sent to histology. There was moderate diverticulosis noted in the sigmoid colon.   The examination was otherwise normal.   Right colon retroflexion included.  Retroflexed views revealed  no abnormalities. The time to cecum = 8.4 Withdrawal time = 30.0   The scope was withdrawn and the procedure completed. COMPLICATIONS: There were no immediate complications.  ENDOSCOPIC IMPRESSION: 1.   Six sessile polyps ranging from 2 to 42mm in size were found in the ascending colon, at the cecum, in the sigmoid colon, and descending colon; polypectomies were performed with a cold snare and with cold forceps 2.   Moderate diverticulosis was noted in the sigmoid colon 3.   The examination was otherwise normal in man with multiple adenomas 2013  RECOMMENDATIONS: Timing of repeat colonoscopy will be determined by pathology findings.  eSigned:  Gatha Mayer, MD, [facility] 09/14/2014 8:54 AM   cc: The Patient and Jeanann Lewandowsky, MD   PATIENT NAME:  David Brandt, David Brandt MR#: [id number]

## 2014-09-14 NOTE — Progress Notes (Signed)
Patient awakening,vss,report to rn 

## 2014-09-15 ENCOUNTER — Telehealth: Payer: Self-pay

## 2014-09-15 DIAGNOSIS — M150 Primary generalized (osteo)arthritis: Secondary | ICD-10-CM | POA: Diagnosis not present

## 2014-09-15 DIAGNOSIS — E291 Testicular hypofunction: Secondary | ICD-10-CM | POA: Diagnosis not present

## 2014-09-15 DIAGNOSIS — E780 Pure hypercholesterolemia: Secondary | ICD-10-CM | POA: Diagnosis not present

## 2014-09-15 DIAGNOSIS — I10 Essential (primary) hypertension: Secondary | ICD-10-CM | POA: Diagnosis not present

## 2014-09-15 NOTE — Telephone Encounter (Signed)
  Follow up Call-  Call back number 09/14/2014  Post procedure Call Back phone  # [phone]  Permission to leave phone message Yes     Patient questions:  Do you have a fever, pain , or abdominal swelling? No. Pain Score  0 *  Have you tolerated food without any problems? Yes.    Have you been able to return to your normal activities? Yes.    Do you have any questions about your discharge instructions: Diet   No. Medications  No. Follow up visit  No.  Do you have questions or concerns about your Care? No.  Actions: * If pain score is 4 or above: No action needed, pain <4.

## 2014-09-21 ENCOUNTER — Encounter: Payer: Self-pay | Admitting: Internal Medicine

## 2014-09-21 NOTE — Progress Notes (Signed)
Quick Note:  1 adenoma and 2-3 hyperplastic polyps max polyp size 6 mm (not the adenoma)  Repeat colonoscopy 3 years - hx adenomas 2013 and now ______

## 2014-12-09 DIAGNOSIS — I10 Essential (primary) hypertension: Secondary | ICD-10-CM | POA: Diagnosis not present

## 2014-12-09 DIAGNOSIS — M150 Primary generalized (osteo)arthritis: Secondary | ICD-10-CM | POA: Diagnosis not present

## 2014-12-09 DIAGNOSIS — E780 Pure hypercholesterolemia: Secondary | ICD-10-CM | POA: Diagnosis not present

## 2014-12-09 DIAGNOSIS — E291 Testicular hypofunction: Secondary | ICD-10-CM | POA: Diagnosis not present

## 2015-01-12 DIAGNOSIS — M5136 Other intervertebral disc degeneration, lumbar region: Secondary | ICD-10-CM | POA: Diagnosis not present

## 2015-03-11 DIAGNOSIS — G4733 Obstructive sleep apnea (adult) (pediatric): Secondary | ICD-10-CM | POA: Diagnosis not present

## 2015-03-18 DIAGNOSIS — G4733 Obstructive sleep apnea (adult) (pediatric): Secondary | ICD-10-CM | POA: Diagnosis not present

## 2015-03-30 ENCOUNTER — Other Ambulatory Visit: Payer: Self-pay | Admitting: Internal Medicine

## 2015-03-30 DIAGNOSIS — Z125 Encounter for screening for malignant neoplasm of prostate: Secondary | ICD-10-CM | POA: Diagnosis not present

## 2015-03-30 DIAGNOSIS — E559 Vitamin D deficiency, unspecified: Secondary | ICD-10-CM | POA: Diagnosis not present

## 2015-03-30 DIAGNOSIS — I10 Essential (primary) hypertension: Secondary | ICD-10-CM | POA: Diagnosis not present

## 2015-03-30 DIAGNOSIS — M109 Gout, unspecified: Secondary | ICD-10-CM | POA: Diagnosis not present

## 2015-03-30 DIAGNOSIS — F17200 Nicotine dependence, unspecified, uncomplicated: Secondary | ICD-10-CM

## 2015-03-30 DIAGNOSIS — E291 Testicular hypofunction: Secondary | ICD-10-CM | POA: Diagnosis not present

## 2015-03-30 DIAGNOSIS — Z23 Encounter for immunization: Secondary | ICD-10-CM | POA: Diagnosis not present

## 2015-03-30 DIAGNOSIS — E780 Pure hypercholesterolemia: Secondary | ICD-10-CM | POA: Diagnosis not present

## 2015-04-05 ENCOUNTER — Encounter (HOSPITAL_COMMUNITY): Payer: Self-pay

## 2015-04-05 ENCOUNTER — Ambulatory Visit (HOSPITAL_COMMUNITY)
Admission: RE | Admit: 2015-04-05 | Discharge: 2015-04-05 | Disposition: A | Discharge status: Home / Self Care | Payer: Medicare PPO | Source: Ambulatory Visit | Attending: Internal Medicine | Admitting: Internal Medicine

## 2015-04-05 DIAGNOSIS — F17200 Nicotine dependence, unspecified, uncomplicated: Secondary | ICD-10-CM

## 2015-04-05 DIAGNOSIS — F17210 Nicotine dependence, cigarettes, uncomplicated: Secondary | ICD-10-CM | POA: Insufficient documentation

## 2015-04-05 DIAGNOSIS — J432 Centrilobular emphysema: Secondary | ICD-10-CM | POA: Diagnosis not present

## 2015-04-05 DIAGNOSIS — Z87890 Personal history of sex reassignment: Secondary | ICD-10-CM | POA: Diagnosis not present

## 2015-04-05 DIAGNOSIS — I2510 Atherosclerotic heart disease of native coronary artery without angina pectoris: Secondary | ICD-10-CM | POA: Insufficient documentation

## 2015-04-05 DIAGNOSIS — Z122 Encounter for screening for malignant neoplasm of respiratory organs: Secondary | ICD-10-CM | POA: Insufficient documentation

## 2015-04-11 DIAGNOSIS — M1711 Unilateral primary osteoarthritis, right knee: Secondary | ICD-10-CM | POA: Diagnosis not present

## 2015-04-13 DIAGNOSIS — E291 Testicular hypofunction: Secondary | ICD-10-CM | POA: Diagnosis not present

## 2015-04-13 DIAGNOSIS — I10 Essential (primary) hypertension: Secondary | ICD-10-CM | POA: Diagnosis not present

## 2015-04-13 DIAGNOSIS — Z23 Encounter for immunization: Secondary | ICD-10-CM | POA: Diagnosis not present

## 2015-04-13 DIAGNOSIS — E781 Pure hyperglyceridemia: Secondary | ICD-10-CM | POA: Diagnosis not present

## 2015-05-31 DIAGNOSIS — M1711 Unilateral primary osteoarthritis, right knee: Secondary | ICD-10-CM | POA: Diagnosis not present

## 2015-06-09 DIAGNOSIS — M1711 Unilateral primary osteoarthritis, right knee: Secondary | ICD-10-CM | POA: Diagnosis not present

## 2015-06-16 DIAGNOSIS — M1711 Unilateral primary osteoarthritis, right knee: Secondary | ICD-10-CM | POA: Diagnosis not present

## 2015-07-07 ENCOUNTER — Ambulatory Visit: Payer: Medicare HMO | Admitting: Pulmonary Disease

## 2015-07-15 ENCOUNTER — Encounter: Payer: Self-pay | Admitting: Pulmonary Disease

## 2015-07-15 ENCOUNTER — Ambulatory Visit (INDEPENDENT_AMBULATORY_CARE_PROVIDER_SITE_OTHER): Payer: Medicare PPO | Admitting: Pulmonary Disease

## 2015-07-15 VITALS — BP 126/78 | HR 83 | Ht 70.0 in | Wt 292.2 lb

## 2015-07-15 DIAGNOSIS — G4733 Obstructive sleep apnea (adult) (pediatric): Secondary | ICD-10-CM | POA: Diagnosis not present

## 2015-07-15 DIAGNOSIS — I10 Essential (primary) hypertension: Secondary | ICD-10-CM | POA: Insufficient documentation

## 2015-07-15 NOTE — Assessment & Plan Note (Signed)
Controlled, FU with PCP

## 2015-07-15 NOTE — Patient Instructions (Signed)
CPAP is working well Work on [employer] loss CPPA supplies will be renewed x  68yr

## 2015-07-15 NOTE — Addendum Note (Signed)
Addended by: Wynn Banker H on: 07/15/2015 11:38 AM   Modules accepted: Orders

## 2015-07-15 NOTE — Progress Notes (Addendum)
   Subjective:    Patient ID: David Brandt, male    DOB: 08/17/1945, 70 y.o.   MRN: [id number]  HPI  Chief Complaint  Patient presents with  . Follow-up    Former [place] pt. Pt states that he wears his CPAP nightly for about 6 hours nightly. Pt denies daytime somnolence.    He is very compliant with his CPAP and cannot sleep without it His weight is unchanged-he has been unable to lose weight Mask is okay, he is tolerating the pressure fine, denies dryness He is on auto CPAP settings  Download shows good usage greater than 6 hours  Significant tests/ events  NPSG 2012:  AHI 116/hr, cpap titrated to 15cm  Review of Systems neg for any significant sore throat, dysphagia, itching, sneezing, nasal congestion or excess/ purulent secretions, fever, chills, sweats, unintended wt loss, pleuritic or exertional cp, hempoptysis, orthopnea pnd or change in chronic leg swelling. Also denies presyncope, palpitations, heartburn, abdominal pain, nausea, vomiting, diarrhea or change in bowel or urinary habits, dysuria,hematuria, rash, arthralgias, visual complaints, headache, numbness weakness or ataxia.     Objective:   Physical Exam   Gen. Pleasant, obese, in no distress ENT - no lesions, no post nasal drip Neck: No JVD, no thyromegaly, no carotid bruits Lungs: no use of accessory muscles, no dullness to percussion, decreased without rales or rhonchi  Cardiovascular: Rhythm regular, heart sounds  normal, no murmurs or gallops, no peripheral edema Musculoskeletal: No deformities, no cyanosis or clubbing , no tremors       Assessment & Plan:

## 2015-07-15 NOTE — Assessment & Plan Note (Signed)
CPAP is working well Work on [employer] loss CPPA supplies will be renewed x  59yr  Weight loss encouraged, compliance with goal of at least 4-6 hrs every night is the expectation. Advised against medications with sedative side effects Cautioned against driving when sleepy - understanding that sleepiness will vary on a day to day basis

## 2015-07-21 ENCOUNTER — Encounter: Payer: Self-pay | Admitting: Pulmonary Disease

## 2015-08-10 DIAGNOSIS — E781 Pure hyperglyceridemia: Secondary | ICD-10-CM | POA: Diagnosis not present

## 2015-08-10 DIAGNOSIS — Z125 Encounter for screening for malignant neoplasm of prostate: Secondary | ICD-10-CM | POA: Diagnosis not present

## 2015-08-10 DIAGNOSIS — R739 Hyperglycemia, unspecified: Secondary | ICD-10-CM | POA: Diagnosis not present

## 2015-08-10 DIAGNOSIS — E7800 Pure hypercholesterolemia, unspecified: Secondary | ICD-10-CM | POA: Diagnosis not present

## 2015-08-10 DIAGNOSIS — M2550 Pain in unspecified joint: Secondary | ICD-10-CM | POA: Diagnosis not present

## 2015-08-10 DIAGNOSIS — I10 Essential (primary) hypertension: Secondary | ICD-10-CM | POA: Diagnosis not present

## 2015-08-11 DIAGNOSIS — M1711 Unilateral primary osteoarthritis, right knee: Secondary | ICD-10-CM | POA: Diagnosis not present

## 2015-09-11 ENCOUNTER — Encounter (HOSPITAL_COMMUNITY): Payer: Self-pay | Admitting: Emergency Medicine

## 2015-09-11 ENCOUNTER — Emergency Department (INDEPENDENT_AMBULATORY_CARE_PROVIDER_SITE_OTHER): Payer: Medicare PPO

## 2015-09-11 ENCOUNTER — Emergency Department (HOSPITAL_COMMUNITY)
Admission: EM | Admit: 2015-09-11 | Discharge: 2015-09-11 | Disposition: A | Discharge status: Home / Self Care | Payer: Medicare PPO | Source: Home / Self Care | Attending: Emergency Medicine | Admitting: Emergency Medicine

## 2015-09-11 DIAGNOSIS — M25561 Pain in right knee: Secondary | ICD-10-CM | POA: Diagnosis not present

## 2015-09-11 DIAGNOSIS — S8991XA Unspecified injury of right lower leg, initial encounter: Secondary | ICD-10-CM | POA: Diagnosis not present

## 2015-09-11 MED ORDER — METHYLPREDNISOLONE ACETATE 40 MG/ML IJ SUSP
INTRAMUSCULAR | Status: AC
  Filled 2015-09-11: qty 1

## 2015-09-11 MED ORDER — LIDOCAINE HCL 2 % IJ SOLN
INTRAMUSCULAR | Status: AC
  Filled 2015-09-11: qty 20

## 2015-09-11 MED ORDER — IBUPROFEN 600 MG PO TABS: 600.0000 mg | ORAL_TABLET | Freq: Four times a day (QID) | ORAL | Status: DC | PRN

## 2015-09-11 NOTE — Discharge Instructions (Signed)
I suspect you tore the meniscus in your knee. We did a cortisone injection today. Please wear the brace when you are up moving around for the next several days. Apply ice to the knee at least 3 times a day. Take ibuprofen every 6 hours as needed for pain. If things are not improving in the next week, please follow-up with [facility].

## 2015-09-11 NOTE — ED Notes (Signed)
C/o right knee pain onset yest... Reports he twisted knee while getting out of the shower Pain increases w/activity... Slow gait... Daughter at bedside A&O x4... No acute distress.

## 2015-09-11 NOTE — ED Provider Notes (Signed)
CSN: [id number]     Arrival date & time 09/11/15  1306 History   First MD Initiated Contact with Patient 09/11/15 1319     Chief Complaint  Patient presents with  . Knee Pain   (Consider location/radiation/quality/duration/timing/severity/associated sxs/prior Treatment) HPI He is a 70 year old man here with his daughter for evaluation of right knee pain. He states he twisted it getting out of the shower yesterday and has had pain since then. Pain is mostly in the medial aspect of his knee. He is not think there has been much swelling. He tried taking 800 mg of ibuprofen last night with minimal improvement. He did get some relief this morning after icing his knee. He is able to walk on the leg, but with a limp and pain. 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 He has seen a physician at [facility] in the past.  Past Medical History  Diagnosis Date  . Hypertension   . Hyperlipidemia   . Allergic rhinitis   . Sleep apnea     cpap  . Gout    Past Surgical History  Procedure Laterality Date  . Mass abdomen  1955  . Colonoscopy     Family History  Problem Relation Age of Onset  . Heart disease Mother   . Stroke Mother   . Colon cancer Father 6  . Stomach cancer Neg Hx   . Esophageal cancer Neg Hx   . Rectal cancer Neg Hx    Social History  Substance Use Topics  . Smoking status: Former Smoker -- 1.00 packs/day for 47 years    Types: Cigarettes    Quit date: 08/23/2014  . Smokeless tobacco: Never Used  . Alcohol Use: 2.4 oz/week    4 Cans of beer per week    Review of Systems As in history of present illness Allergies  Sulfa antibiotics  Home Medications   Prior to Admission medications   Medication Sig Start Date End Date Taking? Authorizing Provider  amLODipine (NORVASC) 10 MG tablet Take 1 tablet by mouth daily. 07/06/15  Yes Historical Provider, MD  aspirin 81 MG tablet Take 81 mg by mouth daily.     Yes Historical Provider, MD  atorvastatin  (LIPITOR) 40 MG tablet Take 40 mg by mouth every evening. 06/11/15  Yes Historical Provider, MD  allopurinol (ZYLOPRIM) 300 MG tablet Take 150 mg by mouth daily. 05/21/15   Historical Provider, MD  felodipine (PLENDIL) 10 MG 24 hr tablet Take 10 mg by mouth daily.      Historical Provider, MD  fluticasone (FLOVENT HFA) 220 MCG/ACT inhaler Inhale 1 puff into the lungs 2 (two) times daily.      Historical Provider, MD  ibuprofen (ADVIL,MOTRIN) 600 MG tablet Take 1 tablet (600 mg total) by mouth every 6 (six) hours as needed for moderate pain. 09/11/15   Melony Overly, MD  Omega-3 Fatty Acids (FISH OIL) 1200 MG CAPS Take 3 capsules by mouth daily.    Historical Provider, MD  pravastatin (PRAVACHOL) 40 MG tablet Take 40 mg by mouth daily.      Historical Provider, MD  Testosterone (ANDROGEL TD) as directed.      Historical Provider, MD  traMADol (ULTRAM) 50 MG tablet Take 50 mg by mouth every 8 (eight) hours as needed. 06/16/15   Historical Provider, MD  valsartan-hydrochlorothiazide (DIOVAN-HCT) 320-12.5 MG per tablet Take 1 tablet by mouth daily.      Historical Provider, MD   Meds Ordered and Administered  this Visit  Medications - No data to display  BP 158/88 mmHg  Pulse 84  Temp(Src) 98.6 F (37 C) (Oral)  Resp 22  SpO2 96% No data found.   Physical Exam  Constitutional: He is oriented to person, place, and time. He appears well-developed and well-nourished. No distress.  Cardiovascular: Normal rate.   Pulmonary/Chest: Effort normal.  Musculoskeletal:  Right knee: No erythema. Minimal swelling along the medial aspect of the knee. No appreciable joint effusion. No patellar or patellar tendon tenderness. No medial or lateral joint line tenderness. No joint laxity. He does have pain with McMurray's.  Neurological: He is alert and oriented to person, place, and time.    ED Course  Injection of joint Date/Time: 09/11/2015 3:04 PM Performed by: Melony Overly Authorized by: Melony Overly Consent: Verbal consent obtained. Risks and benefits: risks, benefits and alternatives were discussed Consent given by: patient Patient understanding: patient states understanding of the procedure being performed Patient identity confirmed: verbally with patient Time out: Immediately prior to procedure a "time out" was called to verify the correct patient, procedure, equipment, support staff and site/side marked as required. Comments: Right knee was cleaned with alcohol. Cold spray was used for anesthetic. 40 mg Depo-Medrol with 4 mL of 2% lidocaine was injected into the right knee. Patient tolerated procedure well with no immediate complication.   (including critical care time)  Labs Review Labs Reviewed - No data to display  Imaging Review Dg Knee Complete 4 Views Right  09/11/2015  CLINICAL DATA:  Recent twisting injury to the knee with pain, initial encounter EXAM: RIGHT KNEE - COMPLETE 4+ VIEW COMPARISON:  06/12/2004 FINDINGS: Mild degenerative changes are noted in all 3 joint compartments but most marked in the medial joint space. No acute fracture or dislocation is seen. No definitive joint effusion is noted. IMPRESSION: Degenerative change without acute abnormality. Electronically Signed   By: Inez Catalina M.D.   On: 09/11/2015 14:48     MDM   1. Right knee pain    I suspect a meniscal tear given mechanism of injury and pain with McMurray's. Cortisone injection done. Knee sleeve, ice, ibuprofen. If not improving, follow up with [facility].    Melony Overly, MD 09/11/15 [phone]

## 2015-10-14 DIAGNOSIS — G4733 Obstructive sleep apnea (adult) (pediatric): Secondary | ICD-10-CM | POA: Diagnosis not present

## 2015-11-02 DIAGNOSIS — I10 Essential (primary) hypertension: Secondary | ICD-10-CM | POA: Diagnosis not present

## 2015-11-02 DIAGNOSIS — E7800 Pure hypercholesterolemia, unspecified: Secondary | ICD-10-CM | POA: Diagnosis not present

## 2015-11-02 DIAGNOSIS — Q245 Malformation of coronary vessels: Secondary | ICD-10-CM | POA: Diagnosis not present

## 2015-11-02 DIAGNOSIS — N529 Male erectile dysfunction, unspecified: Secondary | ICD-10-CM | POA: Diagnosis not present

## 2015-11-16 DIAGNOSIS — M170 Bilateral primary osteoarthritis of knee: Secondary | ICD-10-CM | POA: Diagnosis not present

## 2015-11-16 DIAGNOSIS — M25562 Pain in left knee: Secondary | ICD-10-CM | POA: Diagnosis not present

## 2015-11-16 DIAGNOSIS — R262 Difficulty in walking, not elsewhere classified: Secondary | ICD-10-CM | POA: Diagnosis not present

## 2015-11-16 DIAGNOSIS — M25561 Pain in right knee: Secondary | ICD-10-CM | POA: Diagnosis not present

## 2015-11-23 DIAGNOSIS — M1712 Unilateral primary osteoarthritis, left knee: Secondary | ICD-10-CM | POA: Diagnosis not present

## 2015-11-23 DIAGNOSIS — R062 Wheezing: Secondary | ICD-10-CM | POA: Diagnosis not present

## 2015-11-23 DIAGNOSIS — J209 Acute bronchitis, unspecified: Secondary | ICD-10-CM | POA: Diagnosis not present

## 2015-11-23 DIAGNOSIS — M25562 Pain in left knee: Secondary | ICD-10-CM | POA: Diagnosis not present

## 2015-11-30 DIAGNOSIS — M25561 Pain in right knee: Secondary | ICD-10-CM | POA: Diagnosis not present

## 2015-11-30 DIAGNOSIS — M1711 Unilateral primary osteoarthritis, right knee: Secondary | ICD-10-CM | POA: Diagnosis not present

## 2015-12-07 DIAGNOSIS — M25561 Pain in right knee: Secondary | ICD-10-CM | POA: Diagnosis not present

## 2015-12-07 DIAGNOSIS — R2689 Other abnormalities of gait and mobility: Secondary | ICD-10-CM | POA: Diagnosis not present

## 2015-12-07 DIAGNOSIS — M170 Bilateral primary osteoarthritis of knee: Secondary | ICD-10-CM | POA: Diagnosis not present

## 2015-12-07 DIAGNOSIS — M25562 Pain in left knee: Secondary | ICD-10-CM | POA: Diagnosis not present

## 2015-12-26 DIAGNOSIS — N509 Disorder of male genital organs, unspecified: Secondary | ICD-10-CM | POA: Diagnosis not present

## 2015-12-28 ENCOUNTER — Other Ambulatory Visit: Payer: Self-pay | Admitting: Internal Medicine

## 2015-12-28 DIAGNOSIS — E291 Testicular hypofunction: Secondary | ICD-10-CM | POA: Diagnosis not present

## 2015-12-28 DIAGNOSIS — L03115 Cellulitis of right lower limb: Secondary | ICD-10-CM | POA: Diagnosis not present

## 2015-12-28 DIAGNOSIS — I10 Essential (primary) hypertension: Secondary | ICD-10-CM | POA: Diagnosis not present

## 2015-12-28 DIAGNOSIS — E7800 Pure hypercholesterolemia, unspecified: Secondary | ICD-10-CM | POA: Diagnosis not present

## 2015-12-28 DIAGNOSIS — L97519 Non-pressure chronic ulcer of other part of right foot with unspecified severity: Secondary | ICD-10-CM

## 2016-01-02 ENCOUNTER — Ambulatory Visit (HOSPITAL_COMMUNITY)
Admission: RE | Admit: 2016-01-02 | Discharge: 2016-01-02 | Disposition: A | Discharge status: Home / Self Care | Payer: Medicare PPO | Source: Ambulatory Visit | Attending: Internal Medicine | Admitting: Internal Medicine

## 2016-01-02 DIAGNOSIS — L03031 Cellulitis of right toe: Secondary | ICD-10-CM | POA: Diagnosis not present

## 2016-01-02 DIAGNOSIS — L97511 Non-pressure chronic ulcer of other part of right foot limited to breakdown of skin: Secondary | ICD-10-CM | POA: Diagnosis not present

## 2016-01-02 DIAGNOSIS — L97519 Non-pressure chronic ulcer of other part of right foot with unspecified severity: Secondary | ICD-10-CM

## 2016-01-02 DIAGNOSIS — L03115 Cellulitis of right lower limb: Secondary | ICD-10-CM | POA: Diagnosis not present

## 2016-01-02 DIAGNOSIS — M7989 Other specified soft tissue disorders: Secondary | ICD-10-CM | POA: Diagnosis not present

## 2016-01-09 DIAGNOSIS — E291 Testicular hypofunction: Secondary | ICD-10-CM | POA: Diagnosis not present

## 2016-01-09 DIAGNOSIS — L03115 Cellulitis of right lower limb: Secondary | ICD-10-CM | POA: Diagnosis not present

## 2016-01-09 DIAGNOSIS — I10 Essential (primary) hypertension: Secondary | ICD-10-CM | POA: Diagnosis not present

## 2016-01-09 DIAGNOSIS — E7800 Pure hypercholesterolemia, unspecified: Secondary | ICD-10-CM | POA: Diagnosis not present

## 2016-01-10 DIAGNOSIS — L02215 Cutaneous abscess of perineum: Secondary | ICD-10-CM | POA: Diagnosis not present

## 2016-02-06 ENCOUNTER — Other Ambulatory Visit: Payer: Self-pay | Admitting: General Surgery

## 2016-02-06 DIAGNOSIS — L732 Hidradenitis suppurativa: Secondary | ICD-10-CM | POA: Diagnosis not present

## 2016-02-07 DIAGNOSIS — I10 Essential (primary) hypertension: Secondary | ICD-10-CM | POA: Diagnosis not present

## 2016-02-07 DIAGNOSIS — M2550 Pain in unspecified joint: Secondary | ICD-10-CM | POA: Diagnosis not present

## 2016-02-07 DIAGNOSIS — E781 Pure hyperglyceridemia: Secondary | ICD-10-CM | POA: Diagnosis not present

## 2016-02-07 DIAGNOSIS — L309 Dermatitis, unspecified: Secondary | ICD-10-CM | POA: Diagnosis not present

## 2016-02-07 DIAGNOSIS — E7800 Pure hypercholesterolemia, unspecified: Secondary | ICD-10-CM | POA: Diagnosis not present

## 2016-02-07 DIAGNOSIS — R7302 Impaired glucose tolerance (oral): Secondary | ICD-10-CM | POA: Diagnosis not present

## 2016-03-12 DIAGNOSIS — Z23 Encounter for immunization: Secondary | ICD-10-CM | POA: Diagnosis not present

## 2016-03-12 DIAGNOSIS — E7800 Pure hypercholesterolemia, unspecified: Secondary | ICD-10-CM | POA: Diagnosis not present

## 2016-03-12 DIAGNOSIS — I10 Essential (primary) hypertension: Secondary | ICD-10-CM | POA: Diagnosis not present

## 2016-03-12 DIAGNOSIS — E291 Testicular hypofunction: Secondary | ICD-10-CM | POA: Diagnosis not present

## 2016-03-26 ENCOUNTER — Other Ambulatory Visit: Payer: Self-pay

## 2016-03-26 ENCOUNTER — Telehealth: Payer: Self-pay

## 2016-03-26 DIAGNOSIS — R0602 Shortness of breath: Secondary | ICD-10-CM

## 2016-03-26 DIAGNOSIS — E291 Testicular hypofunction: Secondary | ICD-10-CM | POA: Diagnosis not present

## 2016-03-26 DIAGNOSIS — R079 Chest pain, unspecified: Secondary | ICD-10-CM | POA: Diagnosis not present

## 2016-03-26 DIAGNOSIS — E119 Type 2 diabetes mellitus without complications: Secondary | ICD-10-CM | POA: Diagnosis not present

## 2016-03-26 DIAGNOSIS — I10 Essential (primary) hypertension: Secondary | ICD-10-CM | POA: Diagnosis not present

## 2016-03-26 NOTE — Telephone Encounter (Signed)
**Note De-Identified Charbel Los Obfuscation** Per Dr Irish Lack (due to DOD call) this pt needs to have a Myoveiw due to EKG changes and SOB. I have advised the pt and went over all Myoview instructions with the pt over the phone.  Myoveiw ordered and a message has been sent to [facility] to call pt and arrange date and time of Myoview.

## 2016-03-28 ENCOUNTER — Ambulatory Visit (HOSPITAL_BASED_OUTPATIENT_CLINIC_OR_DEPARTMENT_OTHER): Payer: Medicare PPO

## 2016-03-28 DIAGNOSIS — Z882 Allergy status to sulfonamides status: Secondary | ICD-10-CM | POA: Diagnosis not present

## 2016-03-28 DIAGNOSIS — I080 Rheumatic disorders of both mitral and aortic valves: Secondary | ICD-10-CM | POA: Diagnosis not present

## 2016-03-28 DIAGNOSIS — Z720 Tobacco use: Secondary | ICD-10-CM | POA: Diagnosis not present

## 2016-03-28 DIAGNOSIS — I2510 Atherosclerotic heart disease of native coronary artery without angina pectoris: Secondary | ICD-10-CM | POA: Diagnosis not present

## 2016-03-28 DIAGNOSIS — J9811 Atelectasis: Secondary | ICD-10-CM | POA: Diagnosis not present

## 2016-03-28 DIAGNOSIS — R0602 Shortness of breath: Secondary | ICD-10-CM

## 2016-03-28 DIAGNOSIS — R9431 Abnormal electrocardiogram [ECG] [EKG]: Secondary | ICD-10-CM | POA: Insufficient documentation

## 2016-03-28 DIAGNOSIS — Z4682 Encounter for fitting and adjustment of non-vascular catheter: Secondary | ICD-10-CM | POA: Diagnosis not present

## 2016-03-28 DIAGNOSIS — I10 Essential (primary) hypertension: Secondary | ICD-10-CM | POA: Diagnosis present

## 2016-03-28 DIAGNOSIS — Z951 Presence of aortocoronary bypass graft: Secondary | ICD-10-CM | POA: Diagnosis not present

## 2016-03-28 DIAGNOSIS — Z7982 Long term (current) use of aspirin: Secondary | ICD-10-CM | POA: Diagnosis not present

## 2016-03-28 DIAGNOSIS — G4733 Obstructive sleep apnea (adult) (pediatric): Secondary | ICD-10-CM | POA: Diagnosis present

## 2016-03-28 DIAGNOSIS — I700 Atherosclerosis of aorta: Secondary | ICD-10-CM | POA: Diagnosis present

## 2016-03-28 DIAGNOSIS — E119 Type 2 diabetes mellitus without complications: Secondary | ICD-10-CM | POA: Diagnosis present

## 2016-03-28 DIAGNOSIS — E785 Hyperlipidemia, unspecified: Secondary | ICD-10-CM | POA: Diagnosis present

## 2016-03-28 DIAGNOSIS — F1920 Other psychoactive substance dependence, uncomplicated: Secondary | ICD-10-CM | POA: Diagnosis not present

## 2016-03-28 DIAGNOSIS — J90 Pleural effusion, not elsewhere classified: Secondary | ICD-10-CM | POA: Diagnosis not present

## 2016-03-28 DIAGNOSIS — M109 Gout, unspecified: Secondary | ICD-10-CM | POA: Diagnosis present

## 2016-03-28 DIAGNOSIS — R079 Chest pain, unspecified: Secondary | ICD-10-CM

## 2016-03-28 DIAGNOSIS — R9439 Abnormal result of other cardiovascular function study: Secondary | ICD-10-CM | POA: Diagnosis not present

## 2016-03-28 DIAGNOSIS — I200 Unstable angina: Secondary | ICD-10-CM | POA: Diagnosis not present

## 2016-03-28 DIAGNOSIS — R918 Other nonspecific abnormal finding of lung field: Secondary | ICD-10-CM | POA: Diagnosis not present

## 2016-03-28 DIAGNOSIS — Z7951 Long term (current) use of inhaled steroids: Secondary | ICD-10-CM | POA: Diagnosis not present

## 2016-03-28 DIAGNOSIS — Z23 Encounter for immunization: Secondary | ICD-10-CM | POA: Diagnosis not present

## 2016-03-28 DIAGNOSIS — I2119 ST elevation (STEMI) myocardial infarction involving other coronary artery of inferior wall: Secondary | ICD-10-CM | POA: Diagnosis present

## 2016-03-28 DIAGNOSIS — Z7984 Long term (current) use of oral hypoglycemic drugs: Secondary | ICD-10-CM | POA: Diagnosis not present

## 2016-03-28 DIAGNOSIS — D62 Acute posthemorrhagic anemia: Secondary | ICD-10-CM | POA: Diagnosis not present

## 2016-03-28 DIAGNOSIS — E8770 Fluid overload, unspecified: Secondary | ICD-10-CM | POA: Diagnosis not present

## 2016-03-28 DIAGNOSIS — Z79899 Other long term (current) drug therapy: Secondary | ICD-10-CM | POA: Diagnosis not present

## 2016-03-28 DIAGNOSIS — I4891 Unspecified atrial fibrillation: Secondary | ICD-10-CM | POA: Diagnosis not present

## 2016-03-28 DIAGNOSIS — Z6841 Body Mass Index (BMI) 40.0 and over, adult: Secondary | ICD-10-CM | POA: Diagnosis not present

## 2016-03-28 DIAGNOSIS — J449 Chronic obstructive pulmonary disease, unspecified: Secondary | ICD-10-CM | POA: Diagnosis present

## 2016-03-28 DIAGNOSIS — Z01810 Encounter for preprocedural cardiovascular examination: Secondary | ICD-10-CM | POA: Diagnosis not present

## 2016-03-28 DIAGNOSIS — I25110 Atherosclerotic heart disease of native coronary artery with unstable angina pectoris: Secondary | ICD-10-CM | POA: Diagnosis present

## 2016-03-28 DIAGNOSIS — Z87891 Personal history of nicotine dependence: Secondary | ICD-10-CM | POA: Diagnosis not present

## 2016-03-28 MED ORDER — TECHNETIUM TC 99M TETROFOSMIN IV KIT
33.0000 | PACK | Freq: Once | INTRAVENOUS | Status: AC | PRN
  Administered 2016-03-28: 33 via INTRAVENOUS
  Filled 2016-03-28: qty 33

## 2016-03-29 ENCOUNTER — Encounter (HOSPITAL_COMMUNITY): Payer: Self-pay | Admitting: Cardiology

## 2016-03-29 ENCOUNTER — Inpatient Hospital Stay (HOSPITAL_COMMUNITY)
Admission: AD | Admit: 2016-03-29 | Discharge: 2016-04-11 | DRG: 233 | Disposition: A | Discharge status: Home / Self Care | Payer: Medicare PPO | Source: Ambulatory Visit | Attending: Cardiothoracic Surgery | Admitting: Cardiothoracic Surgery

## 2016-03-29 ENCOUNTER — Ambulatory Visit (HOSPITAL_COMMUNITY): Payer: Medicare PPO

## 2016-03-29 ENCOUNTER — Other Ambulatory Visit: Payer: Self-pay

## 2016-03-29 ENCOUNTER — Inpatient Hospital Stay (HOSPITAL_COMMUNITY): Payer: Medicare PPO

## 2016-03-29 ENCOUNTER — Encounter (HOSPITAL_COMMUNITY)
Admission: AD | Disposition: A | Discharge status: Home / Self Care | Payer: Self-pay | Source: Ambulatory Visit | Attending: Cardiothoracic Surgery

## 2016-03-29 DIAGNOSIS — E785 Hyperlipidemia, unspecified: Secondary | ICD-10-CM | POA: Diagnosis present

## 2016-03-29 DIAGNOSIS — Z6841 Body Mass Index (BMI) 40.0 and over, adult: Secondary | ICD-10-CM

## 2016-03-29 DIAGNOSIS — Z7984 Long term (current) use of oral hypoglycemic drugs: Secondary | ICD-10-CM | POA: Diagnosis not present

## 2016-03-29 DIAGNOSIS — I200 Unstable angina: Secondary | ICD-10-CM | POA: Diagnosis not present

## 2016-03-29 DIAGNOSIS — J449 Chronic obstructive pulmonary disease, unspecified: Secondary | ICD-10-CM

## 2016-03-29 DIAGNOSIS — Z7982 Long term (current) use of aspirin: Secondary | ICD-10-CM | POA: Diagnosis not present

## 2016-03-29 DIAGNOSIS — R079 Chest pain, unspecified: Secondary | ICD-10-CM

## 2016-03-29 DIAGNOSIS — Z7951 Long term (current) use of inhaled steroids: Secondary | ICD-10-CM | POA: Diagnosis not present

## 2016-03-29 DIAGNOSIS — Z87891 Personal history of nicotine dependence: Secondary | ICD-10-CM | POA: Diagnosis not present

## 2016-03-29 DIAGNOSIS — Z23 Encounter for immunization: Secondary | ICD-10-CM

## 2016-03-29 DIAGNOSIS — I25110 Atherosclerotic heart disease of native coronary artery with unstable angina pectoris: Principal | ICD-10-CM

## 2016-03-29 DIAGNOSIS — E119 Type 2 diabetes mellitus without complications: Secondary | ICD-10-CM | POA: Diagnosis present

## 2016-03-29 DIAGNOSIS — Z09 Encounter for follow-up examination after completed treatment for conditions other than malignant neoplasm: Secondary | ICD-10-CM

## 2016-03-29 DIAGNOSIS — Z79899 Other long term (current) drug therapy: Secondary | ICD-10-CM

## 2016-03-29 DIAGNOSIS — I4891 Unspecified atrial fibrillation: Secondary | ICD-10-CM | POA: Diagnosis not present

## 2016-03-29 DIAGNOSIS — Z419 Encounter for procedure for purposes other than remedying health state, unspecified: Secondary | ICD-10-CM

## 2016-03-29 DIAGNOSIS — I700 Atherosclerosis of aorta: Secondary | ICD-10-CM | POA: Diagnosis present

## 2016-03-29 DIAGNOSIS — Z01810 Encounter for preprocedural cardiovascular examination: Secondary | ICD-10-CM | POA: Diagnosis not present

## 2016-03-29 DIAGNOSIS — Z882 Allergy status to sulfonamides status: Secondary | ICD-10-CM

## 2016-03-29 DIAGNOSIS — I10 Essential (primary) hypertension: Secondary | ICD-10-CM | POA: Diagnosis present

## 2016-03-29 DIAGNOSIS — E8770 Fluid overload, unspecified: Secondary | ICD-10-CM | POA: Diagnosis not present

## 2016-03-29 DIAGNOSIS — I2119 ST elevation (STEMI) myocardial infarction involving other coronary artery of inferior wall: Secondary | ICD-10-CM | POA: Diagnosis present

## 2016-03-29 DIAGNOSIS — I2089 Other forms of angina pectoris: Secondary | ICD-10-CM

## 2016-03-29 DIAGNOSIS — R9439 Abnormal result of other cardiovascular function study: Secondary | ICD-10-CM

## 2016-03-29 DIAGNOSIS — Z951 Presence of aortocoronary bypass graft: Secondary | ICD-10-CM

## 2016-03-29 DIAGNOSIS — M109 Gout, unspecified: Secondary | ICD-10-CM | POA: Diagnosis present

## 2016-03-29 DIAGNOSIS — G4733 Obstructive sleep apnea (adult) (pediatric): Secondary | ICD-10-CM | POA: Diagnosis present

## 2016-03-29 DIAGNOSIS — D62 Acute posthemorrhagic anemia: Secondary | ICD-10-CM | POA: Diagnosis not present

## 2016-03-29 DIAGNOSIS — Z720 Tobacco use: Secondary | ICD-10-CM

## 2016-03-29 DIAGNOSIS — M6281 Muscle weakness (generalized): Secondary | ICD-10-CM

## 2016-03-29 DIAGNOSIS — Z9689 Presence of other specified functional implants: Secondary | ICD-10-CM

## 2016-03-29 DIAGNOSIS — I2510 Atherosclerotic heart disease of native coronary artery without angina pectoris: Secondary | ICD-10-CM

## 2016-03-29 DIAGNOSIS — E1169 Type 2 diabetes mellitus with other specified complication: Secondary | ICD-10-CM

## 2016-03-29 DIAGNOSIS — I208 Other forms of angina pectoris: Secondary | ICD-10-CM | POA: Diagnosis present

## 2016-03-29 HISTORY — PX: CARDIAC CATHETERIZATION: SHX172

## 2016-03-29 HISTORY — DX: Type 2 diabetes mellitus without complications: E11.9

## 2016-03-29 HISTORY — DX: Abnormal result of other cardiovascular function study: R94.39

## 2016-03-29 LAB — CBC WITH DIFFERENTIAL/PLATELET
Basophils Absolute: 0 10*3/uL (ref 0.0–0.1)
Basophils Relative: 0 %
EOS PCT: 3 %
Eosinophils Absolute: 0.4 10*3/uL (ref 0.0–0.7)
HEMATOCRIT: 50 % (ref 39.0–52.0)
HEMOGLOBIN: 16.1 g/dL (ref 13.0–17.0)
LYMPHS ABS: 3.3 10*3/uL (ref 0.7–4.0)
LYMPHS PCT: 23 %
MCH: 29.1 pg (ref 26.0–34.0)
MCHC: 32.2 g/dL (ref 30.0–36.0)
MCV: 90.3 fL (ref 78.0–100.0)
MONOS PCT: 7 %
Monocytes Absolute: 1 10*3/uL (ref 0.1–1.0)
NEUTROS ABS: 9.6 10*3/uL — AB (ref 1.7–7.7)
Neutrophils Relative %: 67 %
Platelets: 264 10*3/uL (ref 150–400)
RBC: 5.54 MIL/uL (ref 4.22–5.81)
RDW: 14.7 % (ref 11.5–15.5)
WBC: 14.3 10*3/uL — ABNORMAL HIGH (ref 4.0–10.5)

## 2016-03-29 LAB — POCT I-STAT 3, VENOUS BLOOD GAS (G3P V)
BICARBONATE: 25.6 mmol/L (ref 20.0–28.0)
O2 SAT: 58 %
PH VEN: 7.353 (ref 7.250–7.430)
TCO2: 27 mmol/L (ref 0–100)
pCO2, Ven: 46.1 mmHg (ref 44.0–60.0)
pO2, Ven: 32 mmHg (ref 32.0–45.0)

## 2016-03-29 LAB — POCT I-STAT 3, ART BLOOD GAS (G3+)
Bicarbonate: 25.4 mmol/L (ref 20.0–28.0)
O2 SAT: 93 %
PCO2 ART: 43 mmHg (ref 32.0–48.0)
TCO2: 27 mmol/L (ref 0–100)
pH, Arterial: 7.379 (ref 7.350–7.450)
pO2, Arterial: 69 mmHg — ABNORMAL LOW (ref 83.0–108.0)

## 2016-03-29 LAB — CREATININE, SERUM: Creatinine, Ser: 1 mg/dL (ref 0.61–1.24)

## 2016-03-29 LAB — BASIC METABOLIC PANEL
ANION GAP: 8 (ref 5–15)
BUN: 13 mg/dL (ref 6–20)
CHLORIDE: 103 mmol/L (ref 101–111)
CO2: 27 mmol/L (ref 22–32)
Calcium: 9.5 mg/dL (ref 8.9–10.3)
Creatinine, Ser: 0.97 mg/dL (ref 0.61–1.24)
GFR calc non Af Amer: >60 mL/min (ref >60)
GLUCOSE: 96 mg/dL (ref 65–99)
POTASSIUM: 3.7 mmol/L (ref 3.5–5.1)
Sodium: 138 mmol/L (ref 135–145)

## 2016-03-29 LAB — URINE MICROSCOPIC-ADD ON

## 2016-03-29 LAB — URINALYSIS, ROUTINE W REFLEX MICROSCOPIC
BILIRUBIN URINE: NEGATIVE
GLUCOSE, UA: NEGATIVE mg/dL
KETONES UR: NEGATIVE mg/dL
NITRITE: NEGATIVE
PH: 6.5 (ref 5.0–8.0)
Protein, ur: 30 mg/dL — AB

## 2016-03-29 LAB — POCT ACTIVATED CLOTTING TIME
ACTIVATED CLOTTING TIME: 219 s
Activated Clotting Time: 312 seconds

## 2016-03-29 LAB — MYOCARDIAL PERFUSION IMAGING
CHL CUP NUCLEAR SRS: 22
CHL CUP NUCLEAR SSS: 25
LV dias vol: 128 mL (ref 62–150)
LV sys vol: 66 mL
NUC STRESS TID: 1.19
Peak HR: 88 {beats}/min
RATE: 0.42
Rest HR: 79 {beats}/min
SDS: 3

## 2016-03-29 LAB — CBC
HEMATOCRIT: 50.3 % (ref 39.0–52.0)
Hemoglobin: 16.1 g/dL (ref 13.0–17.0)
MCH: 29.3 pg (ref 26.0–34.0)
MCHC: 32 g/dL (ref 30.0–36.0)
MCV: 91.5 fL (ref 78.0–100.0)
Platelets: 234 10*3/uL (ref 150–400)
RBC: 5.5 MIL/uL (ref 4.22–5.81)
RDW: 14.7 % (ref 11.5–15.5)
WBC: 17.2 10*3/uL — AB (ref 4.0–10.5)

## 2016-03-29 LAB — GLUCOSE, CAPILLARY: GLUCOSE-CAPILLARY: 163 mg/dL — AB (ref 65–99)

## 2016-03-29 LAB — APTT: aPTT: 31 s (ref 24–36)

## 2016-03-29 LAB — PROTIME-INR
INR: 1.04
Prothrombin Time: 13.6 seconds (ref 11.4–15.2)

## 2016-03-29 SURGERY — RIGHT/LEFT HEART CATH AND CORONARY ANGIOGRAPHY
Anesthesia: LOCAL

## 2016-03-29 MED ORDER — MIDAZOLAM HCL 2 MG/2ML IJ SOLN
INTRAMUSCULAR | Status: DC | PRN
Start: 2016-03-29 — End: 2016-03-29
  Administered 2016-03-29: 1 mg via INTRAVENOUS

## 2016-03-29 MED ORDER — HEPARIN (PORCINE) IN NACL 2-0.9 UNIT/ML-% IJ SOLN
INTRAMUSCULAR | Status: AC
  Filled 2016-03-29: qty 1000

## 2016-03-29 MED ORDER — VERAPAMIL HCL 2.5 MG/ML IV SOLN
INTRAVENOUS | Status: AC
  Filled 2016-03-29: qty 2

## 2016-03-29 MED ORDER — METOPROLOL TARTRATE 25 MG PO TABS
25.0000 mg | ORAL_TABLET | Freq: Two times a day (BID) | ORAL | Status: DC
  Administered 2016-03-29 – 2016-04-01 (×7): 25 mg via ORAL
  Filled 2016-03-29 (×7): qty 1

## 2016-03-29 MED ORDER — OMEGA-3-ACID ETHYL ESTERS 1 G PO CAPS
1.0000 g | ORAL_CAPSULE | Freq: Every day | ORAL | Status: DC
  Administered 2016-03-30 – 2016-04-01 (×3): 1 g via ORAL
  Filled 2016-03-29 (×3): qty 1

## 2016-03-29 MED ORDER — SODIUM CHLORIDE 0.9 % IV SOLN: 250.0000 mL | INTRAVENOUS | Status: DC | PRN

## 2016-03-29 MED ORDER — HEPARIN SODIUM (PORCINE) 5000 UNIT/ML IJ SOLN: 5000.0000 [IU] | Freq: Three times a day (TID) | INTRAMUSCULAR | Status: DC

## 2016-03-29 MED ORDER — ATORVASTATIN CALCIUM 40 MG PO TABS: 40.0000 mg | ORAL_TABLET | Freq: Every evening | ORAL | Status: DC

## 2016-03-29 MED ORDER — SODIUM CHLORIDE 0.9% FLUSH: 3.0000 mL | INTRAVENOUS | Status: DC | PRN

## 2016-03-29 MED ORDER — ALPRAZOLAM 0.25 MG PO TABS: 0.2500 mg | ORAL_TABLET | Freq: Two times a day (BID) | ORAL | Status: DC | PRN

## 2016-03-29 MED ORDER — IOPAMIDOL (ISOVUE-370) INJECTION 76%
INTRAVENOUS | Status: AC
  Filled 2016-03-29: qty 100

## 2016-03-29 MED ORDER — LIDOCAINE HCL (PF) 1 % IJ SOLN
INTRAMUSCULAR | Status: AC
  Filled 2016-03-29: qty 30

## 2016-03-29 MED ORDER — ASPIRIN 81 MG PO CHEW
324.0000 mg | CHEWABLE_TABLET | ORAL | Status: AC
  Administered 2016-03-29: 324 mg via ORAL
  Filled 2016-03-29: qty 4

## 2016-03-29 MED ORDER — MIDAZOLAM HCL 2 MG/2ML IJ SOLN
INTRAMUSCULAR | Status: AC
  Filled 2016-03-29: qty 2

## 2016-03-29 MED ORDER — TECHNETIUM TC 99M TETROFOSMIN IV KIT
32.4000 | PACK | Freq: Once | INTRAVENOUS | Status: AC | PRN
  Administered 2016-03-29: 32 via INTRAVENOUS
  Filled 2016-03-29: qty 32

## 2016-03-29 MED ORDER — HEPARIN (PORCINE) IN NACL 2-0.9 UNIT/ML-% IJ SOLN
INTRAMUSCULAR | Status: DC | PRN
  Administered 2016-03-29: 1000 mL

## 2016-03-29 MED ORDER — VERAPAMIL HCL 2.5 MG/ML IV SOLN
INTRAVENOUS | Status: DC | PRN
  Administered 2016-03-29: 10 mL via INTRA_ARTERIAL

## 2016-03-29 MED ORDER — SODIUM CHLORIDE 0.9 % WEIGHT BASED INFUSION: 1.0000 mL/kg/h | INTRAVENOUS | Status: DC

## 2016-03-29 MED ORDER — ASPIRIN EC 81 MG PO TBEC: 81.0000 mg | DELAYED_RELEASE_TABLET | Freq: Every day | ORAL | Status: DC

## 2016-03-29 MED ORDER — LIDOCAINE HCL (PF) 1 % IJ SOLN
INTRAMUSCULAR | Status: DC | PRN
  Administered 2016-03-29: 2 mL via INTRADERMAL
  Administered 2016-03-29 (×2): 1 mL via INTRADERMAL

## 2016-03-29 MED ORDER — ADENOSINE (DIAGNOSTIC) 140MCG/KG/MIN
INTRAVENOUS | Status: DC | PRN
  Administered 2016-03-29: 140 ug/kg/min via INTRAVENOUS

## 2016-03-29 MED ORDER — FENTANYL CITRATE (PF) 100 MCG/2ML IJ SOLN
INTRAMUSCULAR | Status: DC | PRN
  Administered 2016-03-29 (×2): 50 ug via INTRAVENOUS

## 2016-03-29 MED ORDER — ASPIRIN 81 MG PO CHEW: 81.0000 mg | CHEWABLE_TABLET | ORAL | Status: DC

## 2016-03-29 MED ORDER — SODIUM CHLORIDE 0.9 % WEIGHT BASED INFUSION
3.0000 mL/kg/h | INTRAVENOUS | Status: DC
  Administered 2016-03-29: 3 mL/kg/h via INTRAVENOUS

## 2016-03-29 MED ORDER — SODIUM CHLORIDE 0.9% FLUSH
3.0000 mL | Freq: Two times a day (BID) | INTRAVENOUS | Status: DC
  Administered 2016-03-29: 3 mL via INTRAVENOUS

## 2016-03-29 MED ORDER — INSULIN ASPART 100 UNIT/ML ~~LOC~~ SOLN
0.0000 [IU] | Freq: Three times a day (TID) | SUBCUTANEOUS | Status: DC
  Administered 2016-03-31: 1 [IU] via SUBCUTANEOUS
  Administered 2016-03-31: 2 [IU] via SUBCUTANEOUS
  Administered 2016-04-01: 1 [IU] via SUBCUTANEOUS

## 2016-03-29 MED ORDER — INSULIN ASPART 100 UNIT/ML ~~LOC~~ SOLN: 0.0000 [IU] | Freq: Every day | SUBCUTANEOUS | Status: DC

## 2016-03-29 MED ORDER — REGADENOSON 0.4 MG/5ML IV SOLN
0.4000 mg | Freq: Once | INTRAVENOUS | Status: AC
  Administered 2016-03-29: 0.4 mg via INTRAVENOUS

## 2016-03-29 MED ORDER — ASPIRIN EC 81 MG PO TBEC
81.0000 mg | DELAYED_RELEASE_TABLET | Freq: Every day | ORAL | Status: DC
  Administered 2016-03-30 – 2016-04-01 (×3): 81 mg via ORAL
  Filled 2016-03-29 (×3): qty 1

## 2016-03-29 MED ORDER — SODIUM CHLORIDE 0.9% FLUSH
3.0000 mL | Freq: Two times a day (BID) | INTRAVENOUS | Status: DC
Start: 2016-03-29 — End: 2016-04-02
  Administered 2016-03-29 – 2016-04-01 (×3): 3 mL via INTRAVENOUS

## 2016-03-29 MED ORDER — SODIUM CHLORIDE 0.9% FLUSH
3.0000 mL | Freq: Two times a day (BID) | INTRAVENOUS | Status: DC
  Administered 2016-03-30: 3 mL via INTRAVENOUS

## 2016-03-29 MED ORDER — ASPIRIN 300 MG RE SUPP
300.0000 mg | RECTAL | Status: AC
  Filled 2016-03-29: qty 1

## 2016-03-29 MED ORDER — ZOLPIDEM TARTRATE 5 MG PO TABS: 5.0000 mg | ORAL_TABLET | Freq: Every evening | ORAL | Status: DC | PRN

## 2016-03-29 MED ORDER — IOPAMIDOL (ISOVUE-370) INJECTION 76%
INTRAVENOUS | Status: AC
  Filled 2016-03-29: qty 50

## 2016-03-29 MED ORDER — TRAMADOL HCL 50 MG PO TABS: 50.0000 mg | ORAL_TABLET | Freq: Three times a day (TID) | ORAL | Status: DC | PRN

## 2016-03-29 MED ORDER — HEPARIN SODIUM (PORCINE) 1000 UNIT/ML IJ SOLN
INTRAMUSCULAR | Status: DC | PRN
  Administered 2016-03-29 (×2): 5000 [IU] via INTRAVENOUS
  Administered 2016-03-29: 3000 [IU] via INTRAVENOUS

## 2016-03-29 MED ORDER — SODIUM CHLORIDE 0.9 % IV SOLN: INTRAVENOUS | Status: AC

## 2016-03-29 MED ORDER — ALLOPURINOL 300 MG PO TABS
150.0000 mg | ORAL_TABLET | Freq: Every day | ORAL | Status: DC
  Administered 2016-03-30 – 2016-04-11 (×12): 150 mg via ORAL
  Filled 2016-03-29 (×12): qty 1

## 2016-03-29 MED ORDER — HEPARIN SODIUM (PORCINE) 1000 UNIT/ML IJ SOLN
INTRAMUSCULAR | Status: AC
  Filled 2016-03-29: qty 1

## 2016-03-29 MED ORDER — AMLODIPINE BESYLATE 10 MG PO TABS
10.0000 mg | ORAL_TABLET | Freq: Every day | ORAL | Status: DC
  Administered 2016-03-30 – 2016-04-01 (×3): 10 mg via ORAL
  Filled 2016-03-29 (×3): qty 1

## 2016-03-29 MED ORDER — FENTANYL CITRATE (PF) 100 MCG/2ML IJ SOLN
INTRAMUSCULAR | Status: AC
  Filled 2016-03-29: qty 2

## 2016-03-29 MED ORDER — ADENOSINE 12 MG/4ML IV SOLN
INTRAVENOUS | Status: AC
  Filled 2016-03-29: qty 16

## 2016-03-29 MED ORDER — ATORVASTATIN CALCIUM 80 MG PO TABS
80.0000 mg | ORAL_TABLET | Freq: Every evening | ORAL | Status: DC
  Administered 2016-03-29 – 2016-04-10 (×13): 80 mg via ORAL
  Filled 2016-03-29 (×13): qty 1

## 2016-03-29 SURGICAL SUPPLY — 22 items
CATH 5FR JL3.5 JR4 ANG PIG MP (CATHETERS) ×1 IMPLANT
CATH BALLN WEDGE 5F 110CM (CATHETERS) ×1 IMPLANT
CATH MICROCATH NAVVUS (MICROCATHETER) IMPLANT
CATH VISTA GUIDE 6FR XBLAD3.5 (CATHETERS) ×1 IMPLANT
COVER PRB 48X5XTLSCP FOLD TPE (BAG) IMPLANT
COVER PROBE 5X48 (BAG) ×2
DEVICE RAD COMP TR BAND LRG (VASCULAR PRODUCTS) ×2 IMPLANT
GLIDESHEATH SLEND SS 6F .021 (SHEATH) ×1 IMPLANT
GUIDEWIRE .025 260CM (WIRE) ×1 IMPLANT
GUIDEWIRE PRESSURE COMET II (WIRE) IMPLANT
KIT ESSENTIALS PG (KITS) ×1 IMPLANT
KIT HEART LEFT (KITS) ×2 IMPLANT
MICROCATHETER NAVVUS (MICROCATHETER) ×2
PACK CARDIAC CATHETERIZATION (CUSTOM PROCEDURE TRAY) ×2 IMPLANT
SHEATH FAST CATH BRACH 5F 5CM (SHEATH) ×1 IMPLANT
SYR MEDRAD MARK V 150ML (SYRINGE) ×2 IMPLANT
TRANSDUCER W/STOPCOCK (MISCELLANEOUS) ×3 IMPLANT
TUBING CIL FLEX 10 FLL-RA (TUBING) ×2 IMPLANT
WIRE EMERALD 3MM-J .025X260CM (WIRE) ×1 IMPLANT
WIRE HI TORQ VERSACORE-J 145CM (WIRE) ×1 IMPLANT
WIRE RUNTHROUGH .014X180CM (WIRE) ×1 IMPLANT
WIRE SAFE-T 1.5MM-J .035X260CM (WIRE) ×1 IMPLANT

## 2016-03-29 NOTE — Interval H&P Note (Signed)
History and Physical Interval Note:  03/29/2016 4:58 PM  David Brandt  has presented today for cardiac catheterization, with the diagnosis of chest pain and abnormal stress test.  The various methods of treatment have been discussed with the patient and family. After consideration of risks, benefits and other options for treatment, the patient has consented to  Procedure(s): Right/Left Heart Cath and Coronary Angiography (N/A) as a surgical intervention .  [name] history has been reviewed, patient examined, no change in status, stable for surgery.  I have reviewed the patient's chart and labs.  Questions were answered to the patient's satisfaction.    Cath Lab Visit (complete for each Cath Lab visit)  Clinical Evaluation Leading to the Procedure:   ACS: Yes.    Non-ACS:    Anginal Classification: CCS III  Anti-ischemic medical therapy: Minimal Therapy (1 class of medications)  Non-Invasive Test Results: Intermediate-risk stress test findings: cardiac mortality 1-3%/year  Prior CABG: No previous CABG  Joane Postel

## 2016-03-29 NOTE — Progress Notes (Signed)
Pt arrived to tele bed without complaints, though walking up the hill he had DOE.  Currently no chest pain or SOB.    The patient understands that risks included but are not limited to stroke (1 in 1000), death (1 in 107), kidney failure [usually temporary] (1 in 500), bleeding (1 in 200), allergic reaction [possibly serious] (1 in 200).   Pt will have his wife come to hospital while he has procedure.   Pt new diabetic will add sliding scale.

## 2016-03-29 NOTE — Progress Notes (Deleted)
Primary Physician:Clark Primary Cardiologist:  New   HPI:  Pt is a 70 yo who presents for lexiscan myoview today  Positive for inferior defect  Pt has no known Hx of CAD  Followed by Naomie Dean  Notes that recently has had increased SOB and chest tightness.  Pt recnelty at beach  Walking back to house got heaviness onchest  Like something sitting  Easaed off with rest He has had other spells of this  Not always associated with acitvity  This morning had some  Tightness Seen by P clark recently  Set up for myoview  EKG had changed Scan today with inferior defect large that did not improve with rest imaging  LVEF 48%  Note review of CT scan from 2016 pt had evid of atheroscleroisis onf CT    Hx tob  Still recently smoking a few cigs Just dx with DM  Did not take metformin today           Past Medical History:  Diagnosis Date  . Allergic rhinitis   . Gout   . Hyperlipidemia   . Hypertension   . Sleep apnea    cpap     (Not in a hospital admission)     Infusions:   Allergies  Allergen Reactions  . Sulfa Antibiotics Hives    Social History   Social History  . Marital status: Married    Spouse name: Hassan Rowan  . Number of children: Y  . Years of education: N/A   Occupational History  . [employer]    Social History Main Topics  . Smoking status: Former Smoker    Packs/day: 1.00    Years: 47.00    Types: Cigarettes    Quit date: 08/23/2014  . Smokeless tobacco: Never Used  . Alcohol use 2.4 oz/week    4 Cans of beer per week  . Drug use:     Frequency: 7.0 times per week    Types: Marijuana     Comment: marijuana  . Sexual activity: Not on file   Other Topics Concern  . Not on file   Social History Narrative  . No narrative on file    Family History  Problem Relation Age of Onset  . Heart disease Mother   . Stroke Mother   . Colon cancer Father 37  . Stomach cancer Neg Hx   . Esophageal cancer Neg Hx   . Rectal cancer Neg Hx     REVIEW OF  SYSTEMS:  All systems reviewed  Negative to the above problem except as noted above.    PHYSICAL EXAM: There were no vitals filed for this visit.  @IOBRIEF @  General: Morbidly obese 70 yo . No respiratory difficulty HEENT: normal Neck: supple. no JVD. Carotids 2+ bilat; no bruits. No lymphadenopathy or thryomegaly appreciated. Cor: PMI nondisplaced. Regular rate & rhythm. No rubs, gallops or murmurs. Lungs: Decreased airflow with mild wheezing   Abdomen: soft, nontender, nondistended. No hepatosplenomegaly. No bruits or masses. Good bowel sounds. Extremities: no cyanosis, clubbing, rash, edema Neuro: alert & oriented x 3, cranial nerves grossly intact. moves all 4 extremities w/o difficulty. Affect pleasant.  ECG:  SR   T wave inversion inferiorly   ASSESSMENT: 70 yo with abnormal myovue today  CT showed CAD in 2016  COncerning is rest defect and symptoms of heaviness  ON exam he does have some wheezing (?due to COPD, ? Volume)  WOuld recomm R and L heart cat h to define  Described to pt  He agrees  WOuld plan for admt today  Possible cath later or in AM  COunselled on tob  Will need to have glu followed  Hold metformin  Folllow BP

## 2016-03-29 NOTE — H&P (Signed)
Primary Physician:  Carlis Abbott  Primary Cardiologist:  new   HPI: Pt is a 70 yo with no prior hisotry of CAD  Followed by Naomie Dean  Recently has had SOB and chest prssure with exertion  [place]  Had to stop and catch breath  Had tightness  Went away  Since then has had chest pressure at other times  This morning woke with it   Seen by Naomie Dean  Set up for [facility]  Had today  Images with large fixed inferior defect  LVEF 48%  Pt has history of tobacco use  Smokes occasionally  Recently d/x with DM          Past Medical History:  Diagnosis Date  . Abnormal cardiovascular stress test 03/29/2016  . Allergic rhinitis   . Diabetes ([place]) 03/29/2016  . Gout   . Hyperlipidemia   . Hypertension   . Sleep apnea    cpap    Medications Prior to Admission  Medication Sig Dispense Refill  . allopurinol (ZYLOPRIM) 300 MG tablet Take 150 mg by mouth daily.  3  . amLODipine (NORVASC) 10 MG tablet Take 1 tablet by mouth daily.    Marland Kitchen aspirin 81 MG tablet Take 81 mg by mouth daily.      Marland Kitchen atorvastatin (LIPITOR) 40 MG tablet Take 40 mg by mouth every evening.  3  . felodipine (PLENDIL) 10 MG 24 hr tablet Take 10 mg by mouth daily.      . fluticasone (FLOVENT HFA) 220 MCG/ACT inhaler Inhale 1 puff into the lungs 2 (two) times daily.      Marland Kitchen ibuprofen (ADVIL,MOTRIN) 600 MG tablet Take 1 tablet (600 mg total) by mouth every 6 (six) hours as needed for moderate pain. 30 tablet 0  . Omega-3 Fatty Acids (FISH OIL) 1200 MG CAPS Take 3 capsules by mouth daily.    . pravastatin (PRAVACHOL) 40 MG tablet Take 40 mg by mouth daily.      . Testosterone (ANDROGEL TD) as directed.      . traMADol (ULTRAM) 50 MG tablet Take 50 mg by mouth every 8 (eight) hours as needed.  0  . valsartan-hydrochlorothiazide (DIOVAN-HCT) 320-12.5 MG per tablet Take 1 tablet by mouth daily.         Marland Kitchen allopurinol  150 mg Oral Daily  . amLODipine  10 mg Oral Daily  . aspirin  324 mg Oral NOW   Or  . aspirin  300  mg Rectal NOW  . [START ON 03/30/2016] aspirin  81 mg Oral Pre-Cath  . [START ON 03/30/2016] aspirin EC  81 mg Oral Daily  . atorvastatin  40 mg Oral QPM  . heparin  5,000 Units Subcutaneous Q8H  . insulin aspart  0-5 Units Subcutaneous QHS  . insulin aspart  0-9 Units Subcutaneous TID WC  . metoprolol tartrate  25 mg Oral BID  . omega-3 acid ethyl esters  1 g Oral Daily  . sodium chloride flush  3 mL Intravenous Q12H  . sodium chloride flush  3 mL Intravenous Q12H    Infusions: . [START ON 03/30/2016] sodium chloride 3 mL/kg/hr (03/29/16 1603)   Followed by  . [START ON 03/30/2016] sodium chloride      Allergies  Allergen Reactions  . Sulfa Antibiotics Hives    Social History   Social History  . Marital status: Married    Spouse name: Hassan Rowan  . Number of children: Y  . Years of education: N/A  Occupational History  . [employer]    Social History Main Topics  . Smoking status: Former Smoker    Packs/day: 1.00    Years: 47.00    Types: Cigarettes    Quit date: 08/23/2014  . Smokeless tobacco: Never Used  . Alcohol use 2.4 oz/week    4 Cans of beer per week  . Drug use:     Frequency: 7.0 times per week    Types: Marijuana     Comment: marijuana  . Sexual activity: Not on file   Other Topics Concern  . Not on file   Social History Narrative  . No narrative on file    Family History  Problem Relation Age of Onset  . Colon cancer Father 63  . Heart disease Mother   . Stroke Mother   . Stomach cancer Neg Hx   . Esophageal cancer Neg Hx   . Rectal cancer Neg Hx     REVIEW OF SYSTEMS:  All systems reviewed  Negative to the above problem except as noted above.    PHYSICAL EXAM: Vitals:   03/29/16 1356  BP: 131/71  Pulse: 78  Temp: 98.6 F (37 C)    No intake or output data in the 24 hours ending 03/29/16 1633  General:  Morbidly obese 70 yo in NAD  No respiratory difficulty HEENT: normal Neck: supple. no JVD. Carotids 2+ bilat; no bruits. No  lymphadenopathy or thryomegaly appreciated. Cor: PMI nondisplaced. Regular rate & rhythm. No rubs, gallops or murmurs. Lungs: decreased airflow with wheezes   Abdomen: soft, nontender, nondistended. No hepatosplenomegaly. No bruits or masses. Good bowel sounds. Extremities: no cyanosis, clubbing, rash, edema Neuro: alert & oriented x 3, cranial nerves grossly intact. moves all 4 extremities w/o difficulty. Affect pleasant.  ECG:  SR   T wave inversion II, III AVF  Results for orders placed or performed during the hospital encounter of 03/29/16 (from the past 24 hour(s))  Basic metabolic panel     Status: None   Collection Time: 03/29/16  2:42 PM  Result Value Ref Range   Sodium 138 135 - 145 mmol/L   Potassium 3.7 3.5 - 5.1 mmol/L   Chloride 103 101 - 111 mmol/L   CO2 27 22 - 32 mmol/L   Glucose, Bld 96 65 - 99 mg/dL   BUN 13 6 - 20 mg/dL   Creatinine, Ser 0.97 0.61 - 1.24 mg/dL   Calcium 9.5 8.9 - 10.3 mg/dL   GFR calc non Af Amer >60 >60 mL/min   GFR calc Af Amer >60 >60 mL/min   Anion gap 8 5 - 15  CBC WITH DIFFERENTIAL     Status: Abnormal   Collection Time: 03/29/16  2:42 PM  Result Value Ref Range   WBC 14.3 (H) 4.0 - 10.5 K/uL   RBC 5.54 4.22 - 5.81 MIL/uL   Hemoglobin 16.1 13.0 - 17.0 g/dL   HCT 50.0 39.0 - 52.0 %   MCV 90.3 78.0 - 100.0 fL   MCH 29.1 26.0 - 34.0 pg   MCHC 32.2 30.0 - 36.0 g/dL   RDW 14.7 11.5 - 15.5 %   Platelets 264 150 - 400 K/uL   Neutrophils Relative % 67 %   Lymphocytes Relative 23 %   Monocytes Relative 7 %   Eosinophils Relative 3 %   Basophils Relative 0 %   Neutro Abs 9.6 (H) 1.7 - 7.7 K/uL   Lymphs Abs 3.3 0.7 - 4.0 K/uL   Monocytes Absolute  1.0 0.1 - 1.0 K/uL   Eosinophils Absolute 0.4 0.0 - 0.7 K/uL   Basophils Absolute 0.0 0.0 - 0.1 K/uL   WBC Morphology ATYPICAL LYMPHOCYTES   Protime-INR     Status: None   Collection Time: 03/29/16  2:42 PM  Result Value Ref Range   Prothrombin Time 13.6 11.4 - 15.2 seconds   INR 1.04   APTT      Status: None   Collection Time: 03/29/16  2:42 PM  Result Value Ref Range   aPTT 31 24 - 36 seconds   Dg Chest Port 1 View  Result Date: 03/29/2016 CLINICAL DATA:  Wrist angina, abnormal cardiovascular stress test, history of diabetes, former smoker EXAM: PORTABLE CHEST 1 VIEW COMPARISON:  Chest x-ray of October 09, 2010 and chest CT scan of April 05, 2015 FINDINGS: The lungs are well-expanded. The interstitial markings are coarse bilaterally. There is no alveolar infiltrate or pleural effusion. The pulmonary vascularity is not engorged. The heart is normal in size. The mediastinum is normal in width. The observed bony thorax is unremarkable. IMPRESSION: Mild hyperinflation consistent with COPD and the patient's smoking history. No pneumonia, CHF, nor other acute cardiopulmonary abnormality. Electronically Signed   By: David  Martinique M.D.   On: 03/29/2016 15:35     ASSESSMENT: 70 yo with positive myvoiew  As noted above  CT scan last year showed coronary atherosclerosis  If have reviewed with pty    ON exam some wheezing  ? COPD vs fluid  recomm R and L heart cath to define  Given progreessive history and symptoms in early am would admit and do today or tomorrow.  Did nto take metformin yet    Counselled on tobacco

## 2016-03-29 NOTE — Progress Notes (Signed)
Patient's urine noted to have blood in it. Reports no pain and a new issue since after his cath this afternoon. On call cardiologist paged. Order for ua, urine culture, and repeat cbc at midnight received. Will continue to monitor.

## 2016-03-30 ENCOUNTER — Inpatient Hospital Stay (HOSPITAL_COMMUNITY): Payer: Medicare PPO

## 2016-03-30 ENCOUNTER — Other Ambulatory Visit: Payer: Self-pay | Admitting: *Deleted

## 2016-03-30 ENCOUNTER — Encounter (HOSPITAL_COMMUNITY): Payer: Self-pay | Admitting: Internal Medicine

## 2016-03-30 DIAGNOSIS — I200 Unstable angina: Secondary | ICD-10-CM

## 2016-03-30 DIAGNOSIS — I25110 Atherosclerotic heart disease of native coronary artery with unstable angina pectoris: Secondary | ICD-10-CM

## 2016-03-30 DIAGNOSIS — I2510 Atherosclerotic heart disease of native coronary artery without angina pectoris: Secondary | ICD-10-CM

## 2016-03-30 DIAGNOSIS — J449 Chronic obstructive pulmonary disease, unspecified: Secondary | ICD-10-CM

## 2016-03-30 LAB — BASIC METABOLIC PANEL
Anion gap: 8 (ref 5–15)
BUN: 12 mg/dL (ref 6–20)
CHLORIDE: 105 mmol/L (ref 101–111)
CO2: 25 mmol/L (ref 22–32)
Calcium: 9.1 mg/dL (ref 8.9–10.3)
Creatinine, Ser: 0.97 mg/dL (ref 0.61–1.24)
Glucose, Bld: 107 mg/dL — ABNORMAL HIGH (ref 65–99)
POTASSIUM: 3.7 mmol/L (ref 3.5–5.1)
SODIUM: 138 mmol/L (ref 135–145)

## 2016-03-30 LAB — PULMONARY FUNCTION TEST
DL/VA % pred: 110 %
DL/VA: 4.96 ml/min/mmHg/L
DLCO cor % pred: 81 %
DLCO cor: 24.09 ml/min/mmHg
DLCO unc % pred: 83 %
DLCO unc: 24.68 ml/min/mmHg
FEF 25-75 Post: 1.01 L/sec
FEF 25-75 Pre: 0.71 L/sec
FEF2575-%Change-Post: 41 %
FEF2575-%Pred-Post: 42 %
FEF2575-%Pred-Pre: 30 %
FEV1-%Change-Post: 6 %
FEV1-%Pred-Post: 58 %
FEV1-%Pred-Pre: 54 %
FEV1-Post: 1.57 L
FEV1-Pre: 1.47 L
FEV1FVC-%Change-Post: 2 %
FEV1FVC-%Pred-Pre: 87 %
FEV6-%Change-Post: 3 %
FEV6-%Pred-Post: 65 %
FEV6-%Pred-Pre: 63 %
FEV6-Post: 2.22 L
FEV6-Pre: 2.15 L
FEV6FVC-%Change-Post: 0 %
FEV6FVC-%Pred-Post: 103 %
FEV6FVC-%Pred-Pre: 103 %
FVC-%Change-Post: 3 %
FVC-%Pred-Post: 63 %
FVC-%Pred-Pre: 61 %
FVC-Post: 2.26 L
FVC-Pre: 2.18 L
Post FEV1/FVC ratio: 69 %
Post FEV6/FVC ratio: 98 %
Pre FEV1/FVC ratio: 67 %
Pre FEV6/FVC Ratio: 98 %
RV % pred: 160 %
RV: 3.7 L
TLC % pred: 95 %
TLC: 6.34 L

## 2016-03-30 LAB — CBC
HEMATOCRIT: 47.7 % (ref 39.0–52.0)
HEMATOCRIT: 48.2 % (ref 39.0–52.0)
HEMOGLOBIN: 15.4 g/dL (ref 13.0–17.0)
HEMOGLOBIN: 15.5 g/dL (ref 13.0–17.0)
MCH: 29.4 pg (ref 26.0–34.0)
MCH: 29.3 pg (ref 26.0–34.0)
MCHC: 32.3 g/dL (ref 30.0–36.0)
MCHC: 32.2 g/dL (ref 30.0–36.0)
MCV: 91 fL (ref 78.0–100.0)
MCV: 91.1 fL (ref 78.0–100.0)
Platelets: 239 10*3/uL (ref 150–400)
Platelets: 246 10*3/uL (ref 150–400)
RBC: 5.24 MIL/uL (ref 4.22–5.81)
RBC: 5.29 MIL/uL (ref 4.22–5.81)
RDW: 14.8 % (ref 11.5–15.5)
RDW: 15 % (ref 11.5–15.5)
WBC: 13.4 10*3/uL — AB (ref 4.0–10.5)
WBC: 12.8 10*3/uL — AB (ref 4.0–10.5)

## 2016-03-30 LAB — HEPARIN LEVEL (UNFRACTIONATED)
HEPARIN UNFRACTIONATED: 0.76 [IU]/mL — AB (ref 0.30–0.70)
Heparin Unfractionated: 0.31 IU/mL (ref 0.30–0.70)

## 2016-03-30 LAB — LIPID PANEL
Cholesterol: 126 mg/dL (ref 0–200)
HDL: 31 mg/dL — ABNORMAL LOW (ref >40)
LDL Cholesterol: 36 mg/dL (ref 0–99)
Total CHOL/HDL Ratio: 4.1 RATIO
Triglycerides: 296 mg/dL — ABNORMAL HIGH (ref <150)
VLDL: 59 mg/dL — ABNORMAL HIGH (ref 0–40)

## 2016-03-30 LAB — SURGICAL PCR SCREEN
MRSA, PCR: NEGATIVE
STAPHYLOCOCCUS AUREUS: NEGATIVE

## 2016-03-30 LAB — GLUCOSE, CAPILLARY
GLUCOSE-CAPILLARY: 93 mg/dL (ref 65–99)
Glucose-Capillary: 96 mg/dL (ref 65–99)

## 2016-03-30 LAB — ECHOCARDIOGRAM COMPLETE
HEIGHTINCHES: 70 in
WEIGHTICAEL: 4579.2 [oz_av]

## 2016-03-30 MED ORDER — FUROSEMIDE 10 MG/ML IJ SOLN
20.0000 mg | Freq: Every day | INTRAMUSCULAR | Status: DC
  Administered 2016-03-30 – 2016-04-01 (×3): 20 mg via INTRAVENOUS
  Filled 2016-03-30 (×3): qty 2

## 2016-03-30 MED ORDER — IPRATROPIUM-ALBUTEROL 0.5-2.5 (3) MG/3ML IN SOLN: 3.0000 mL | RESPIRATORY_TRACT | Status: DC

## 2016-03-30 MED ORDER — LIVING WELL WITH DIABETES BOOK
Freq: Once | Status: AC
  Administered 2016-03-30: 15:00:00
  Filled 2016-03-30: qty 1

## 2016-03-30 MED ORDER — HEPARIN (PORCINE) IN NACL 100-0.45 UNIT/ML-% IJ SOLN
1150.0000 [IU]/h | INTRAMUSCULAR | Status: DC
  Administered 2016-03-30: 1500 [IU]/h via INTRAVENOUS
  Administered 2016-03-30: 1600 [IU]/h via INTRAVENOUS
  Administered 2016-03-31: 1300 [IU]/h via INTRAVENOUS
  Administered 2016-04-01: 1150 [IU]/h via INTRAVENOUS
  Filled 2016-03-30 (×4): qty 250

## 2016-03-30 MED ORDER — BUDESONIDE 0.5 MG/2ML IN SUSP
0.5000 mg | Freq: Two times a day (BID) | RESPIRATORY_TRACT | Status: DC
  Administered 2016-03-30 – 2016-04-11 (×24): 0.5 mg via RESPIRATORY_TRACT
  Filled 2016-03-30 (×25): qty 2

## 2016-03-30 MED ORDER — LIVING WELL WITH DIABETES BOOK
Freq: Once | Status: DC
  Filled 2016-03-30: qty 1

## 2016-03-30 MED ORDER — ALBUTEROL SULFATE (2.5 MG/3ML) 0.083% IN NEBU
2.5000 mg | INHALATION_SOLUTION | Freq: Once | RESPIRATORY_TRACT | Status: AC
Start: 2016-03-30 — End: 2016-03-30
  Administered 2016-03-30: 2.5 mg via RESPIRATORY_TRACT

## 2016-03-30 NOTE — Consult Note (Signed)
[facility] CM Primary Care Navigator  03/30/2016  Shahzain Un 08/10/1945 [id number]  Met with patient at the bedside to identify possible discharge needs.  Patient verbalized having increased shortness of breath when walking and chest tightness that led to this admission. He endorses  Dr. Preston [facility] at [facility] [place] as the primary care provider.    Patient shared using [facility] [address] to obtain medications without difficulty and manages his own medications weekly using "pill box" system.   Patient had been driving prior to admission but wife will provide transportation to doctors' appointments once he gets home per patient.  Patient had been independent with self care. At present, wife (Brenda) is his primary caregiver as stated although wife currently works.   Patient voiced understanding to call primary care provider's office for a post discharge follow-up appointment within a week or sooner if needs arise. Patient letter provided as a reminder.  Patient's nurse notified that he verbally agreed for referral to inpatient diabetes coordinator since he was newly diagnosed with diabetes.  For additional questions please contact:   A. , BSN, RN-BC [facility] PRIMARY CARE Navigator Cell: [phone] 

## 2016-03-30 NOTE — Progress Notes (Signed)
ANTICOAGULATION CONSULT NOTE - Initial Consult  Pharmacy Consult for Heparin Indication: Severe 3V CAD  Allergies  Allergen Reactions  . Sulfa Antibiotics Hives    Patient Measurements: Height: 5\' 10"  (177.8 cm) Weight: 286 lb 3.2 oz (129.8 kg) IBW/kg (Calculated) : 73 Heparin Dosing Weight: 103 kg  Vital Signs: Temp: 98.3 F (36.8 C) (10/06 0025) Temp Source: Axillary (10/06 0025) BP: 119/68 (10/06 0025) Pulse Rate: 68 (10/06 0025)  Labs:  Recent Labs  03/29/16 1442 03/29/16 2030 03/30/16 0156  HGB 16.1 16.1 15.4  HCT 50.0 50.3 47.7  PLT 264 234 239  APTT 31  --   --   LABPROT 13.6  --   --   INR 1.04  --   --   CREATININE 0.97 1.00 0.97    Estimated Creatinine Clearance: 98.7 mL/min (by C-G formula based on SCr of 0.97 mg/dL).   Medical History: Past Medical History:  Diagnosis Date  . Abnormal cardiovascular stress test 03/29/2016  . Allergic rhinitis   . Diabetes ([place]) 03/29/2016  . Gout   . Hyperlipidemia   . Hypertension   . Sleep apnea    cpap    Medications:  Awaiting med rec  Assessment: 70 y.o. M presents s/p positive myoview. Pt went to cath today which showed 3V CAD - CVTS consult pending. To begin heparin 4 hr post TR band deflation. TR band defalted ~0030 per RN. Pt with some blood in urine last night, Hgb remains stable. Dr. Raiford Simmonds ok with starting heparin as planned.  Goal of Therapy:  Heparin level 0.3-0.7 units/ml Monitor platelets by anticoagulation protocol: Yes   Plan:  Heparin gtt at 1500 units/hr Will f/u heparin level in 6 hours Daily heparin level and CBC F/u CVTS consult  Sherlon Handing, PharmD, BCPS Clinical pharmacist, pager [phone] 03/30/2016,4:22 AM

## 2016-03-30 NOTE — Progress Notes (Signed)
Pt set up on Cpap 9.0 CMH20 full face mask tolerating well.  Patient states feels like his at home advised if he has any issues to have RN call RT.

## 2016-03-30 NOTE — Plan of Care (Signed)
Problem: Phase I Progression Outcomes Goal: Vascular site scale level 0 - I Vascular Site Scale Level 0: No bruising/bleeding/hematoma Level I (Mild): Bruising/Ecchymosis, minimal bleeding/ooozing, palpable hematoma < 3 cm Level II (Moderate): Bleeding not affecting hemodynamic parameters, pseudoaneurysm, palpable hematoma > 3 cm Level III  (Severe) Bleeding which affects hemodynamic parameters or retroperitoneal hemorrhage   Outcome: Completed/Met Date Met: 03/30/16 Patient has a left and right radial site that remains clean, dry, intact with no s/s of complications and circulation, temp, sensation, color remain within normal limits.

## 2016-03-30 NOTE — Plan of Care (Signed)
Problem: Phase I Progression Outcomes Goal: Initial discharge plan identified Outcome: Progressing Patient will stay and be cleared by pulmonologist and PCP and have his testing completed so that he can have Open Heart Surgery tentatively for Monday.

## 2016-03-30 NOTE — Progress Notes (Addendum)
[facility] for Heparin Indication: Severe 3V CAD  Allergies  Allergen Reactions  . Sulfa Antibiotics Hives    Patient Measurements: Height: 5\' 10"  (177.8 cm) Weight: 286 lb 3.2 oz (129.8 kg) IBW/kg (Calculated) : 73 Heparin Dosing Weight: 103 kg  Vital Signs: Temp: 98.3 F (36.8 C) (10/06 1200) Temp Source: Oral (10/06 1200) BP: 151/91 (10/06 1200) Pulse Rate: 65 (10/06 1200)  Labs:  Recent Labs  03/29/16 1442 03/29/16 2030 03/30/16 0156 03/30/16 0406 03/30/16 1107  HGB 16.1 16.1 15.4 15.5  --   HCT 50.0 50.3 47.7 48.2  --   PLT 264 234 239 246  --   APTT 31  --   --   --   --   LABPROT 13.6  --   --   --   --   INR 1.04  --   --   --   --   HEPARINUNFRC  --   --   --   --  0.31  CREATININE 0.97 1.00 0.97  --   --     Estimated Creatinine Clearance: 98.7 mL/min (by C-G formula based on SCr of 0.97 mg/dL).  Assessment: 70 y.o. M presents s/p positive myoview. Pt went to cath 10/5 which showed 3V CAD - CVTS consult pending. To begin heparin 4 hr post TR band deflation. TR band defalted ~0030 per RN. Pt with some blood in urine last night, Hgb remains stable. Dr. Raiford Simmonds ok with starting heparin as planned. Heparin level therapeutic at 0.31 on rate of 1500 units/hr.  CBC stable.  No more reports of hematuria.  Goal of Therapy:  Heparin level 0.3-0.7 units/ml Monitor platelets by anticoagulation protocol: Yes   Plan:  Increase Heparin gtt to 1600 units/hr Will f/u heparin level in 6 hours Daily heparin level and CBC F/u CVTS consult  Eudelia Bunch, Pharm.D. [id number] 03/30/2016 1:37 PM

## 2016-03-30 NOTE — Consult Note (Signed)
[address]       [place] [address]             [id number]        [facility] Medical Record [id number] Date of Birth: [birth year]  Referring: Nelva Bush, MD Primary Care: [name], MD  Chief Complaint:   Chest pressure with exertion; multivessel coronary artery disease   History of Present Illness:     This is a 70 year old African American male with no prior history of coronary artery disease. Recently, he  has had chest pressure with exertion and shortness of breath. He was recently  the beach. He had chest tightness and had to stop to catch his breath. Chest tightness did go away;however, since then,he has had chest pressure, most recently the morning of admission on 03/29/2016. Patient notes episodes of orthopnea, nocturnal dyspnea. For past 3 months he has had to use inhalers because of wheezing .  He had a nuclear stress test which showed LVEF 45-54% and a large inferior and inferior lateral wall MI from the apex to the base with no ischemia. He then underwent a cardiac catheterization by Dr. Saunders Revel. Results showed LVEF 45-50%, severe 3 vessel coronary artery disease (please see official results below), and inferior akinesis.  I was consulted today  consulted for the consideration of coronary artery bypass grafting surgery. Currently, the patient is in NO acute distress and he denies chest pain.    Current Activity/ Functional Status: Patient is independent with mobility/ambulation, transfers, ADL's, IADL's.   Zubrod Score: At the time of surgery this patient's most appropriate activity status/level should be described as: []     0    Normal activity, no symptoms [x]     1    Restricted in physical strenuous activity but ambulatory, able to do out light work []     2    Ambulatory and capable of self care, unable to do work activities, up and about  more than 50%  Of the time                            []     3    Only limited self  care, in bed greater than 50% of waking hours []     4    Completely disabled, no self care, confined to bed or chair []     5    Moribund  Past Medical History:  Diagnosis Date  . Abnormal cardiovascular stress test 03/29/2016  . Allergic rhinitis   . Diabetes ([place]) 03/29/2016  . Gout   . Hyperlipidemia   . Hypertension   . Sleep apnea    cpap    Past Surgical History:  Procedure Laterality Date  . CARDIAC CATHETERIZATION N/A 03/29/2016   Procedure: Right/Left Heart Cath and Coronary Angiography;  Surgeon: Nelva Bush, MD;  Location: [facility];  Service: Cardiovascular;  Laterality: N/A;  . CARDIAC CATHETERIZATION N/A 03/29/2016   Procedure: Intravascular Pressure Wire/FFR Study;  Surgeon: Nelva Bush, MD;  Location: [facility];  Service: Cardiovascular;  Laterality: N/A;  . COLONOSCOPY    . Mass on left side of the abdomen-removed at age 89  [place] History  . Marital status: Married    Spouse name: Hassan Rowan  . Number of children: Y  . Years of education: N/A   Occupational History  . [employer]  Social History Main Topics  . Smoking status: Former Smoker    Packs/day: 1.00    Years: 47.00    Types: Cigarettes    Quit date: 08/23/2014  . Smokeless tobacco: Never Used  . Alcohol use 2.4 oz/week    4 Cans of beer per week  . Drug use:     Frequency: 7.0 times per week    Types: Marijuana     Comment: marijuana  . Sexual activity: Not on file    Allergies  Allergen Reactions  . Sulfa Antibiotics Hives    Current Facility-Administered Medications  Medication Dose Route Frequency Provider Last Rate Last Dose  . 0.9 %  sodium chloride infusion  250 mL Intravenous PRN Isaiah Serge, NP      . 0.9 %  sodium chloride infusion  250 mL Intravenous PRN Nelva Bush, MD      . allopurinol (ZYLOPRIM) tablet 150 mg  150 mg Oral Daily Isaiah Serge, NP   150 mg at 03/30/16 0855  . ALPRAZolam Duanne Moron) tablet 0.25 mg  0.25 mg  Oral BID PRN Isaiah Serge, NP      . amLODipine (NORVASC) tablet 10 mg  10 mg Oral Daily Isaiah Serge, NP   10 mg at 03/30/16 0857  . aspirin EC tablet 81 mg  81 mg Oral Daily Isaiah Serge, NP   81 mg at 03/30/16 0854  . atorvastatin (LIPITOR) tablet 80 mg  80 mg Oral QPM Nelva Bush, MD   80 mg at 03/30/16 0857  . budesonide (PULMICORT) nebulizer solution 0.5 mg  0.5 mg Nebulization BID Marat Fudim, MD   0.5 mg at 03/30/16 0333  . heparin ADULT infusion 100 units/mL (25000 units/240mL sodium chloride 0.45%)  1,500 Units/hr Intravenous Continuous Franky Macho, RPH 15 mL/hr at 03/30/16 0535 1,500 Units/hr at 03/30/16 0535  . insulin aspart (novoLOG) injection 0-5 Units  0-5 Units Subcutaneous QHS Isaiah Serge, NP      . insulin aspart (novoLOG) injection 0-9 Units  0-9 Units Subcutaneous TID WC Isaiah Serge, NP      . metoprolol tartrate (LOPRESSOR) tablet 25 mg  25 mg Oral BID Isaiah Serge, NP   25 mg at 03/30/16 0856  . omega-3 acid ethyl esters (LOVAZA) capsule 1 g  1 g Oral Daily Isaiah Serge, NP   1 g at 03/30/16 0856  . sodium chloride flush (NS) 0.9 % injection 3 mL  3 mL Intravenous Q12H Isaiah Serge, NP   3 mL at 03/29/16 2200  . sodium chloride flush (NS) 0.9 % injection 3 mL  3 mL Intravenous PRN Isaiah Serge, NP      . sodium chloride flush (NS) 0.9 % injection 3 mL  3 mL Intravenous Q12H Christopher End, MD      . sodium chloride flush (NS) 0.9 % injection 3 mL  3 mL Intravenous PRN Nelva Bush, MD      . traMADol Veatrice Bourbon) tablet 50 mg  50 mg Oral Q8H PRN Isaiah Serge, NP      . zolpidem (AMBIEN) tablet 5 mg  5 mg Oral QHS PRN Isaiah Serge, NP        Prescriptions Prior to Admission  Medication Sig Dispense Refill Last Dose  . allopurinol (ZYLOPRIM) 300 MG tablet Take 150 mg by mouth daily.  3 03/29/2016 at Unknown time  . amLODipine (NORVASC) 10 MG tablet Take 10 mg by mouth daily.  03/29/2016 at Unknown time  . ANDROGEL PUMP 20.25 MG/ACT (1.62%) GEL  Apply 2 each topically daily.   Past Week at Unknown time  . aspirin 81 MG tablet Take 81 mg by mouth daily.     03/29/2016 at Unknown time  . atorvastatin (LIPITOR) 40 MG tablet Take 40 mg by mouth every evening.  3 Past Week at Unknown time  . Cholecalciferol (VITAMIN D3) 2000 units TABS Take 2,000 Units by mouth daily.   03/29/2016 at Unknown time  . felodipine (PLENDIL) 10 MG 24 hr tablet Take 10 mg by mouth daily.     03/29/2016 at Unknown time  . fluticasone (FLOVENT HFA) 220 MCG/ACT inhaler Inhale 2 puffs into the lungs daily as needed (for shortness of breath).    Past Week at Unknown time  . hydrocortisone cream 1 % Apply 1 application topically as needed for itching.   Past Week at Unknown time  . OVER THE COUNTER MEDICATION Take 1 capsule by mouth 2 (two) times daily. ProbioticXL   03/29/2016 at Unknown time  . OVER THE COUNTER MEDICATION Take 2 capsules by mouth 2 (two) times daily. OmegaXL   03/29/2016 at Unknown time  . SitaGLIPtin-MetFORMIN HCl (JANUMET XR) 50-1000 MG TB24 Take 1 tablet by mouth every evening.   Past Week at Unknown time  . Tetrahydrozoline HCl (VISINE OP) Place 1 drop into both eyes daily.   Past Week at Unknown time  . valsartan-hydrochlorothiazide (DIOVAN-HCT) 320-12.5 MG per tablet Take 1 tablet by mouth daily.     03/29/2016 at Unknown time  . ibuprofen (ADVIL,MOTRIN) 600 MG tablet Take 1 tablet (600 mg total) by mouth every 6 (six) hours as needed for moderate pain. (Patient not taking: Reported on 03/30/2016) 30 tablet 0 Completed Course at Unknown time    Family History  Problem Relation Age of Onset  . Colon cancer Father 66  . Heart disease Mother   . Stroke Mother   . Stomach cancer Neg Hx   . Esophageal cancer Neg Hx   . Rectal cancer Neg Hx    Review of Systems:     Cardiac Review of Systems: Y or N             Exertional SOB  [ Y ]  Orthopnea Aqua.Slicker  ]   Pedal Edema [  N ]    Palpitations Aqua.Slicker  ] Syncope  Aqua.Slicker  ]   Presyncope [  N ]  General Review of  Systems: [Y] = yes [  ]=no Constitional:  fatigue [ Y ]; nausea [  N]; night sweats [ N ]; fever [N  ]; or chills [ N ]                                                                Eye : blurred vision [ N ]; diplopia [ N  ]; Amaurosis fugax[ N ]; Resp: cough [  N];  wheezing[ N ];  hemoptysis[ N ];  GI:  vomiting[ N ];  dysphagia[ N ]; melena[ N ];  hematochezia Aqua.Slicker  ]; heartburn[  ];    hematuria[ N ];                  Skin: rash, swelling[ N ];, hair loss[  N ];  Heme/Lymph: bruising[  N];  bleeding[ N ];  anemia[N  ];  Neuro: TIA[ N ];    stroke[ N ]; difficulty walking[N  ];  Psych:depression[  N]; anxiety[N  ];  Endocrine: diabetes[Y  ];  thyroid dysfunction[ N ];    Physical Exam: BP (!) 151/91 (BP Location: Left Leg)   Pulse 65   Temp 98.3 F (36.8 C) (Oral)   Resp (!) 29   Ht 5\' 10"  (1.778 m)   Wt 286 lb 3.2 oz (129.8 kg)   SpO2 97%   BMI 41.07 kg/m    General appearance: alert, cooperative and no distress Head: Normocephalic, without obvious abnormality, atraumatic Neck: no carotid bruit and supple, symmetrical, trachea midline Resp: active wheezing at time of exam Cardio: regular rate and rhythm, S1, S2 normal, no murmur, click, rub or gallop GI: Soft, obese, non tender, well healed vertical scar left abdomen, probable umbilical hernia Extremities: No cyanosis, clubbing, or edema. Palpable DP and PT bilaterally Neurologic: Grossly normal  Diagnostic Studies & Laboratory data:     Nuclear stress EF: 48%.  The left ventricular ejection fraction is mildly decreased (45-54%).   Large inferior and inferior lateral wall MI from apex to base with no ischemia EF 48% with mid and basal inferior wall akinesis   Intravascular Pressure Wire/FFR Study by Dr. Saunders Revel on 03/29/2016:  Right/Left Heart Cath and Coronary Angiography  Conclusion   Conclusions: 1. Severe 3 vessel coronary artery disease, as detailed below, including 50% distal LMCA/ostial LAD stenoses (FFR 0.70),  80% D1 lesion, 80% ostial LCx stenosis, and diffusely diseased RCA with 99% mid RCA stenosis with TIMI-1 flow and competitive filling of the PDA and rPL branches by left-to-right collaterals. 2. Mildly decreased LV systolic function with inferior akinesis (LVEF 45-50%) 3. Upper normal to mildly elevated left heart filling pressures. 4. Reduced Fick cardiac output.  Recommendations: 1. Transfer to 3W-stepdown for monitoring in the setting of unstable angina with severe three-vessel CAD.  Initiate heparin infusion 4 hours after TR band deflated. 2. Cardiac surgery consultation in AM to evaluate for CABG. 3. Consider viability study to further assess the inferior wall prior to revascularization. 4. Transthoracic echocardiogram to evaluate for significant valvular disease. 5. Continue ASA and metoprolol; will increase atorvastatin to 80 mg daily.     Recent Radiology Findings:   Dg Chest Port 1 View  Result Date: 03/29/2016 CLINICAL DATA:  Wrist angina, abnormal cardiovascular stress test, history of diabetes, former smoker EXAM: PORTABLE CHEST 1 VIEW COMPARISON:  Chest x-ray of October 09, 2010 and chest CT scan of April 05, 2015 FINDINGS: The lungs are well-expanded. The interstitial markings are coarse bilaterally. There is no alveolar infiltrate or pleural effusion. The pulmonary vascularity is not engorged. The heart is normal in size. The mediastinum is normal in width. The observed bony thorax is unremarkable. IMPRESSION: Mild hyperinflation consistent with COPD and the patient's smoking history. No pneumonia, CHF, nor other acute cardiopulmonary abnormality. Electronically Signed   By: David  Martinique M.D.   On: 03/29/2016 15:35     I have independently reviewed the above radiologic studies.  Recent Lab Findings: Lab Results  Component Value Date   WBC 12.8 (H) 03/30/2016   HGB 15.5 03/30/2016   HCT 48.2 03/30/2016   PLT 246 03/30/2016   GLUCOSE 107 (H) 03/30/2016   CHOL 126  03/30/2016   TRIG 296 (H) 03/30/2016   HDL 31 (L) 03/30/2016   LDLCALC 36 03/30/2016   ALT 20  03/23/2010   AST 22 03/23/2010   NA 138 03/30/2016   K 3.7 03/30/2016   CL 105 03/30/2016   CREATININE 0.97 03/30/2016   BUN 12 03/30/2016   CO2 25 03/30/2016   TSH 0.928 03/23/2010   INR 1.04 03/29/2016   PFT's done  Interpretation: The FVC, FEV1, FEV1/FVC ratio and FEF25-75% are reduced indicating airway obstruction. The FVC is reduced relative to the SVC indicating air trapping. The increased airway resistance and decreased specific conductance indicate a central airway disease. The SVC is reduced, but the TLC is within normal limits. Following administration of bronchodilators, there is no significant response. The diffusing capacity is normal. Conclusions: Moderately severe airway obstruction is present. A clinical trial of bronchodilators may be beneficial in view of the airway obstruction. The absence of overinflation suggests a restrictive process such as pleural or chest wall disease. Pulmonary Function Diagnosis: Moderately severe Obstructive Airways Disease Restriction -Possible     Assessment / Plan:   1. Severe three vessel coronary artery disease-on Heparin drip. Consider CABG Monday if respiratory status is stable  2. Diabetes (HCC)-on Insulin. Await HGA1C- nes dx 3 weeks ago 3. Hyperlipidemia-continue Lipitor, Lovaza 4. Hypertension-continue Lopressor 25 mg bid and Norvasc 10 mg daily 5. OSA-CPAP 6. Moderate severe COPD with active wheezing on exam at time of consult   I  spent 40 minutes counseling the patient face to face and 50% or more the  time was spent in counseling and coordination of care. The total time spent in the appointment was 60 minutes.  [name] MD      [address] [place] [address] Office [phone]   [place]

## 2016-03-30 NOTE — Progress Notes (Signed)
    Subjective:  Feels ok. Some shortness of breath. No chest pain.   Objective:  Vital Signs in the last 24 hours: Temp:  [97.9 F (36.6 C)-98.6 F (37 C)] 98.2 F (36.8 C) (10/06 1610) Pulse Rate:  [62-83] 62 (10/06 1610) Resp:  [13-29] 25 (10/06 1610) BP: (119-173)/(62-104) 139/82 (10/06 1610) SpO2:  [94 %-99 %] 94 % (10/06 1610)  Intake/Output from previous day: 10/05 0701 - 10/06 0700 In: 246.3 [P.O.:240; I.V.:6.3] Out: 1050 [Urine:1050]  Physical Exam: Pt is alert and oriented, obese male in NAD HEENT: normal Neck: JVP - normal Lungs: wheezing bilaterally CV: RRR without murmur or gallop, distant heart sounds Abd: soft, NT, Positive BS, no hepatomegaly Ext: no C/C/E, right radial site clear Skin: warm/dry no rash   Lab Results:  Recent Labs  03/30/16 0156 03/30/16 0406  WBC 13.4* 12.8*  HGB 15.4 15.5  PLT 239 246    Recent Labs  03/29/16 1442 03/29/16 2030 03/30/16 0156  NA 138  --  138  K 3.7  --  3.7  CL 103  --  105  CO2 27  --  25  GLUCOSE 96  --  107*  BUN 13  --  12  CREATININE 0.97 1.00 0.97   No results for input(s): TROPONINI in the last 72 hours.  Invalid input(s): CK, MB  Cardiac Studies: 2D Echo: Study Conclusions  - Left ventricle: The cavity size was normal. Wall thickness was   increased in a pattern of moderate LVH. Systolic function was   normal. The estimated ejection fraction was in the range of 55%   to 60%. Left ventricular diastolic function parameters were   normal. - Left atrium: The atrium was mildly dilated. - Atrial septum: No defect or patent foramen ovale was identified. - Pulmonary arteries: PA peak pressure: 34 mm Hg (S).  Tele: Sinus rhythm  Assessment/Plan:  1. Unstable angina with severe left main and multivessel CAD 2. Obesity 3. COPD with active wheezing 4. HTN 5. Hyperlipidemia  Pt seen by Dr Servando Snare, plans tentative for CABG Monday as long as respiratory status is stable. Pulmonary  consultation made. Will continue IV heparin. Add lasix 20 mg IV daily.  Sherren Mocha, M.D. 03/30/2016, 6:45 PM

## 2016-03-30 NOTE — Consult Note (Signed)
Pulmonary Consult Note  Patient name: David Brandt Medical record number: [id number] Date of birth: 17-Mar-1946 Age: 70 y.o. Gender: male PCP: [name], MD  Date: 03/30/2016 Reason for Consult: Wheezing, Pre-operative evaluation given hx of ?COPD and OSA  Referring Physician: [name], MD  HPI:  David Brandt is a 70 y/o man with a hx of tobacco abuse as well as obesity and OSA, treated with CPAP. He does not have a diagnosis of COPD, but does take PRN albuterol and reports frequent wheezing which resolves with a albuterol. He denies cough productive of sputum. He has not had spirometry. He was admitted for UA and found to have left main disease and is planning on getting a CABG in the next few days. Pulmonary was consulted to best manage his respiratory symptoms.  Past Medical History:  Diagnosis Date  . Abnormal cardiovascular stress test 03/29/2016  . Allergic rhinitis   . Diabetes ([place]) 03/29/2016  . Gout   . Hyperlipidemia   . Hypertension   . Sleep apnea    cpap    Past Surgical History:  Procedure Laterality Date  . CARDIAC CATHETERIZATION N/A 03/29/2016   Procedure: Right/Left Heart Cath and Coronary Angiography;  Surgeon: Nelva Bush, MD;  Location: [facility];  Service: Cardiovascular;  Laterality: N/A;  . CARDIAC CATHETERIZATION N/A 03/29/2016   Procedure: Intravascular Pressure Wire/FFR Study;  Surgeon: Nelva Bush, MD;  Location: [facility];  Service: Cardiovascular;  Laterality: N/A;  . COLONOSCOPY    . mass abdomen  1955    Family History  Problem Relation Age of Onset  . Colon cancer Father 50  . Heart disease Mother   . Stroke Mother   . Stomach cancer Neg Hx   . Esophageal cancer Neg Hx   . Rectal cancer Neg Hx     Social History:  reports that he quit smoking about 19 months ago. His smoking use included Cigarettes. He has a 47.00 pack-year smoking history. He has never used smokeless tobacco. He reports that he drinks  about 2.4 oz of alcohol per week . He reports that he uses drugs, including Marijuana, about 7 times per week.  Allergies:  Allergies  Allergen Reactions  . Sulfa Antibiotics Hives    Medications: I have reviewed the patient's current medications.  Pertinent items are noted in HPI.  Temp:  [98 F (36.7 C)-98.6 F (37 C)] 98.5 F (36.9 C) (10/06 2100) Pulse Rate:  [62-82] 82 (10/06 2235) Resp:  [17-29] 22 (10/06 2100) BP: (119-156)/(68-91) 156/75 (10/06 2235) SpO2:  [94 %-98 %] 94 % (10/06 2100)  Intake/Output Summary (Last 24 hours) at 03/30/16 2328 Last data filed at 03/30/16 2200  Gross per 24 hour  Intake           190.25 ml  Output             1400 ml  Net         -1209.75 ml   Physical exam  Gen: Middle aged man in NAD HEENT: CPAP mask in place, MMM Pulm: End expiratory wheezes Card: Normal S1/S2 Abd: Obese, but soft Extrem: Mild edema   LAB RESULTS BMET    Component Value Date/Time   NA 138 03/30/2016 0156   K 3.7 03/30/2016 0156   CL 105 03/30/2016 0156   CO2 25 03/30/2016 0156   GLUCOSE 107 (H) 03/30/2016 0156   BUN 12 03/30/2016 0156   CREATININE 0.97 03/30/2016 0156   CALCIUM 9.1 03/30/2016 0156  GFRNONAA >60 03/30/2016 0156   GFRAA >60 03/30/2016 0156   CBC    Component Value Date/Time   WBC 12.8 (H) 03/30/2016 0406   RBC 5.29 03/30/2016 0406   HGB 15.5 03/30/2016 0406   HCT 48.2 03/30/2016 0406   PLT 246 03/30/2016 0406   MCV 91.1 03/30/2016 0406   MCH 29.3 03/30/2016 0406   MCHC 32.2 03/30/2016 0406   RDW 15.0 03/30/2016 0406   LYMPHSABS 3.3 03/29/2016 1442   MONOABS 1.0 03/29/2016 1442   EOSABS 0.4 03/29/2016 1442   BASOSABS 0.0 03/29/2016 1442   ABG    Component Value Date/Time   PHART 7.379 03/29/2016 1804   HCO3 25.6 03/29/2016 1805   TCO2 27 03/29/2016 1805   O2SAT 58.0 03/29/2016 1805   Radiology CXR: Interstitial markings  Assessment and plan:  Pre-operative risk management: David Brandt is a moderate risk for  pulmonary complications from a high-risk surgery. However, given his UA and left main disease, would not delay surgery to better workup or optimize lung function. I have ordered scheduled albuterol and ipratropium nebulized treatments to best optimize his bronchodilation. He does not have symptoms to suggest an exacerbation of obstructive lung disease, so I would not recommend treatment with systemic steroids or antibiotics at this time. Inhaled long-acting LABAs/LAMAs or ICSs are controller medications, and may be indicated for long-term management, but are less likely to benefit him pre-operatively. Periopative use of albuterol (or levalbuterol to avoid tachycardia) is advised, as well as extubation to BiPAP, and aggressive post-operative toilet.  Luz Brazen, MD Luz Brazen, MD Pulmonary & Critical Care Medicine March 30, 2016, 11:42 PM

## 2016-03-30 NOTE — Progress Notes (Signed)
  RD consulted for nutrition education regarding new dx of diabetes 3 weeks ago.   No results found for: HGBA1C  RD provided "Carbohydrate Counting for People with Diabetes" handout from the Academy of Nutrition and Dietetics. Discussed different food groups and their effects on blood sugar, emphasizing carbohydrate-containing foods. Provided list of carbohydrates and recommended serving sizes of common foods.  Discussed importance of controlled and consistent carbohydrate intake throughout the day. Provided examples of ways to balance meals/snacks and encouraged intake of high-fiber, whole grain complex carbohydrates. Teach back method used.  Expect good compliance. Recommend OP education after d/c.  Body mass index is 41.07 kg/m. Pt meets criteria for class 3, extreme/morbid obesity based on current BMI.  Current diet order is heart healthy / CHO-modified, patient is consuming approximately 100% of meals at this time. Labs and medications reviewed. No further nutrition interventions warranted at this time. RD contact information provided. If additional nutrition issues arise, please re-consult RD.  Molli Barrows, RD, LDN, [place] Pager [phone] After Hours Pager [phone]

## 2016-03-30 NOTE — Progress Notes (Signed)
Inpatient Diabetes Program Recommendations  AACE/[name]: New Consensus Statement on Inpatient Glycemic Control (2015)  Target Ranges:  Prepandial:   less than 140 mg/dL      Peak postprandial:   less than 180 mg/dL (1-2 hours)      Critically ill patients:  140 - 180 mg/dL   Results for David Brandt, David Brandt (MRN [id number]) as of 03/30/2016 14:46  Ref. Range 03/29/2016 14:42 03/30/2016 01:56  Glucose Latest Ref Range: 65 - 99 mg/dL 96 107 (H)    Admit with: CP  History: DM (diagnosed 3 weeks ago)  Home DM Meds: Janumet 50/1000 QPM  Current Insulin Orders: Novolog Sensitive Correction Scale/ SSI (0-9 units) TID AC + HS     -Spoke with pt about new diagnosis.  Discussed A1C results with patient and his wife and explained what an A1C is, basic pathophysiology of DM Type 2, basic home care, basic diabetes diet nutrition principles, importance of checking CBGs and maintaining good CBG control to prevent long-term and short-term complications.  Also reviewed blood sugar goals and A1c goals for home.  Also explained to pt what Janumet is and how it works, how to take, Social research officer, government.  -RNs to provide ongoing basic DM education at bedside with this patient.  Have ordered educational booklet and DM videos as appropriate.  Have also placed RD consult for DM diet education for this patient.  -Patient has CBG meter at home and has been checking CBGs QAM at home.  Recommended to pt to check 2 hours after a meal a few times a week to also check postprandial glucose levels.  Explained to pt that AM CBGs should be 80-130 mg/dl and 2 hour after meal CBGs should be <180 mg/dl.  -Patient and wife very appreciative on all information.     --Will follow patient during hospitalization--  Wyn Quaker RN, MSN, CDE Diabetes Coordinator Inpatient Glycemic Control Team Team Pager: [phone] (8a-5p)

## 2016-03-30 NOTE — Progress Notes (Signed)
*  PRELIMINARY RESULTS* Echocardiogram 2D Echocardiogram has been performed.  [name] 03/30/2016, 3:35 PM

## 2016-03-30 NOTE — Plan of Care (Signed)
Problem: Phase I Progression Outcomes Goal: Distal pulses equal to baseline Outcome: Completed/Met Date Met: 03/30/16 Pulses, circulation, temp, sensation and color remain within normal limits and patient denies pain to the sites.

## 2016-03-30 NOTE — Plan of Care (Signed)
Problem: Phase I Progression Outcomes Goal: Pain controlled with appropriate interventions Outcome: Completed/Met Date Met: 03/30/16 Patient denies pain at this time and both radial sites with dressings remain clean, dry and intact with no s/s of complications, VSS at this time.

## 2016-03-30 NOTE — Plan of Care (Signed)
Problem: Phase I Progression Outcomes Goal: Hemodynamically stable Outcome: Completed/Met Date Met: 03/30/16 VSS and site remains clean, dry and intact with no s/s of complications.

## 2016-03-30 NOTE — Progress Notes (Signed)
ANTICOAGULATION CONSULT NOTE - Follow Up Consult  Pharmacy Consult for heparin Indication: severe 3V CAD  Allergies  Allergen Reactions  . Sulfa Antibiotics Hives    Patient Measurements: Height: 5\' 10"  (177.8 cm) Weight: 286 lb 3.2 oz (129.8 kg) IBW/kg (Calculated) : 73 Heparin Dosing Weight: 103 kg  Vital Signs: Temp: 98.5 F (36.9 C) (10/06 2100) Temp Source: Oral (10/06 2100) BP: 156/75 (10/06 2100) Pulse Rate: 75 (10/06 2100)  Labs:  Recent Labs  03/29/16 1442 03/29/16 2030 03/30/16 0156 03/30/16 0406 03/30/16 1107 03/30/16 2135  HGB 16.1 16.1 15.4 15.5  --   --   HCT 50.0 50.3 47.7 48.2  --   --   PLT 264 234 239 246  --   --   APTT 31  --   --   --   --   --   LABPROT 13.6  --   --   --   --   --   INR 1.04  --   --   --   --   --   HEPARINUNFRC  --   --   --   --  0.31 0.76*  CREATININE 0.97 1.00 0.97  --   --   --     Estimated Creatinine Clearance: 98.7 mL/min (by C-G formula based on SCr of 0.97 mg/dL).   Medications:  Scheduled:  . allopurinol  150 mg Oral Daily  . amLODipine  10 mg Oral Daily  . aspirin EC  81 mg Oral Daily  . atorvastatin  80 mg Oral QPM  . budesonide (PULMICORT) nebulizer solution  0.5 mg Nebulization BID  . furosemide  20 mg Intravenous Daily  . insulin aspart  0-5 Units Subcutaneous QHS  . insulin aspart  0-9 Units Subcutaneous TID WC  . metoprolol tartrate  25 mg Oral BID  . omega-3 acid ethyl esters  1 g Oral Daily  . sodium chloride flush  3 mL Intravenous Q12H  . sodium chloride flush  3 mL Intravenous Q12H   Infusions:  . 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  Heparin level is 0.76.  Goal of Therapy:  Heparin level 0.3-0.7 units/ml Monitor platelets by anticoagulation protocol: Yes   Plan:  - reduce heparin to 1450 units/hr - 6hr heparin level  Lus Kriegel, Tsz-Yin 03/30/2016,10:13 PM

## 2016-03-31 ENCOUNTER — Inpatient Hospital Stay (HOSPITAL_COMMUNITY): Payer: Medicare PPO

## 2016-03-31 DIAGNOSIS — I25110 Atherosclerotic heart disease of native coronary artery with unstable angina pectoris: Secondary | ICD-10-CM

## 2016-03-31 DIAGNOSIS — J449 Chronic obstructive pulmonary disease, unspecified: Secondary | ICD-10-CM

## 2016-03-31 DIAGNOSIS — G4733 Obstructive sleep apnea (adult) (pediatric): Secondary | ICD-10-CM

## 2016-03-31 DIAGNOSIS — Z01810 Encounter for preprocedural cardiovascular examination: Secondary | ICD-10-CM

## 2016-03-31 LAB — VAS US DOPPLER PRE CABG
LEFT ECA DIAS: -15 cm/s
LEFT VERTEBRAL DIAS: 11 cm/s
Left CCA dist dias: -20 cm/s
Left CCA dist sys: -73 cm/s
Left CCA prox dias: 22 cm/s
Left CCA prox sys: 106 cm/s
Left ICA dist dias: -15 cm/s
Left ICA dist sys: -79 cm/s
Left ICA prox dias: -22 cm/s
Left ICA prox sys: -90 cm/s
RIGHT ECA DIAS: 9 cm/s
RIGHT VERTEBRAL DIAS: 10 cm/s
Right CCA prox dias: 10 cm/s
Right CCA prox sys: 62 cm/s
Right cca dist sys: -56 cm/s

## 2016-03-31 LAB — CBC
HCT: 48.2 % (ref 39.0–52.0)
HEMOGLOBIN: 15.5 g/dL (ref 13.0–17.0)
MCH: 29.4 pg (ref 26.0–34.0)
MCHC: 32.2 g/dL (ref 30.0–36.0)
MCV: 91.3 fL (ref 78.0–100.0)
Platelets: 230 10*3/uL (ref 150–400)
RBC: 5.28 MIL/uL (ref 4.22–5.81)
RDW: 15 % (ref 11.5–15.5)
WBC: 13.3 10*3/uL — ABNORMAL HIGH (ref 4.0–10.5)

## 2016-03-31 LAB — BASIC METABOLIC PANEL
Anion gap: 9 (ref 5–15)
BUN: 16 mg/dL (ref 6–20)
CHLORIDE: 104 mmol/L (ref 101–111)
CO2: 25 mmol/L (ref 22–32)
CREATININE: 1.09 mg/dL (ref 0.61–1.24)
Calcium: 9.1 mg/dL (ref 8.9–10.3)
Glucose, Bld: 105 mg/dL — ABNORMAL HIGH (ref 65–99)
POTASSIUM: 3.7 mmol/L (ref 3.5–5.1)
SODIUM: 138 mmol/L (ref 135–145)

## 2016-03-31 LAB — GLUCOSE, CAPILLARY
GLUCOSE-CAPILLARY: 155 mg/dL — AB (ref 65–99)
GLUCOSE-CAPILLARY: 109 mg/dL — AB (ref 65–99)
Glucose-Capillary: 124 mg/dL — ABNORMAL HIGH (ref 65–99)
Glucose-Capillary: 125 mg/dL — ABNORMAL HIGH (ref 65–99)

## 2016-03-31 LAB — URINE CULTURE

## 2016-03-31 LAB — HEPARIN LEVEL (UNFRACTIONATED)
HEPARIN UNFRACTIONATED: 0.74 [IU]/mL — AB (ref 0.30–0.70)
HEPARIN UNFRACTIONATED: 0.78 [IU]/mL — AB (ref 0.30–0.70)
HEPARIN UNFRACTIONATED: 0.58 [IU]/mL (ref 0.30–0.70)

## 2016-03-31 MED ORDER — IPRATROPIUM-ALBUTEROL 0.5-2.5 (3) MG/3ML IN SOLN
3.0000 mL | RESPIRATORY_TRACT | Status: DC
Start: 2016-03-31 — End: 2016-04-02
  Administered 2016-03-31 – 2016-04-02 (×12): 3 mL via RESPIRATORY_TRACT
  Filled 2016-03-31 (×13): qty 3

## 2016-03-31 MED ORDER — IPRATROPIUM-ALBUTEROL 0.5-2.5 (3) MG/3ML IN SOLN
3.0000 mL | RESPIRATORY_TRACT | Status: DC
Start: 2016-03-31 — End: 2016-03-31

## 2016-03-31 NOTE — Plan of Care (Signed)
Problem: Safety: Goal: Ability to remain free from injury will improve Outcome: Completed/Met Date Met: 03/31/16 Patient is able to use call light and does use it to call for assistance and was informed and is okay with the bed alarm being on at night to keep him safe from falling, he verbalized understanding and is okay with it.

## 2016-03-31 NOTE — Consult Note (Addendum)
Pulmonary Consult Note  Patient name: David Brandt Medical record number: [id number] Date of birth: 05-20-1946 Age: 70 y.o. Gender: male PCP: [name], MD  Date: 03/31/2016 Reason for Consult: Wheezing, Pre-operative evaluation given hx of ?COPD and OSA  Referring Physician: [name], MD  breief  David Brandt is a 70 y/o man with a hx of tobacco abuse as well as obesity and OSA, treated with CPAP. He does not have a diagnosis of COPD, but does take PRN albuterol and reports frequent wheezing which resolves with a albuterol. He denies cough productive of sputum. He has not had spirometry. He was admitted for UA and found to have left main disease and is planning on getting a CABG in the next few days. Pulmonary was consulted to best manage his respiratory symptoms.    SUBJECTIVE/OVERNIGHT/INTERVAL HX 03/31/16 - wheezing and d yspnea better with nebs. Wife says he is very compliant with incentive spirometry   Temp:  [97.9 F (36.6 C)-98.5 F (36.9 C)] 98.1 F (36.7 C) (10/07 1115) Pulse Rate:  [62-82] 62 (10/07 0430) Resp:  [22-26] 26 (10/07 0430) BP: (131-156)/(72-82) 136/82 (10/07 1030) SpO2:  [93 %-95 %] 95 % (10/07 1143)  Intake/Output Summary (Last 24 hours) at 03/31/16 1450 Last data filed at 03/31/16 1103  Gross per 24 hour  Intake            541.5 ml  Output              850 ml  Net           -308.5 ml   Physical exam  Gen: Middle aged man in NAD HEENT: CPAP mask in place, MMM Pulm: WHEEZING HAS RESOLVED Card: Normal S1/S2 Abd: Obese, but soft Extrem: Mild edema   PULMONARY  Recent Labs Lab 03/29/16 1804 03/29/16 1805  PHART 7.379  --   PCO2ART 43.0  --   PO2ART 69.0*  --   HCO3 25.4 25.6  TCO2 27 27  O2SAT 93.0 58.0    CBC  Recent Labs Lab 03/30/16 0156 03/30/16 0406 03/31/16 0339  HGB 15.4 15.5 15.5  HCT 47.7 48.2 48.2  WBC 13.4* 12.8* 13.3*  PLT 239 246 230    COAGULATION  Recent Labs Lab 03/29/16 1442  INR 1.04     CARDIAC  No results for input(s): TROPONINI in the last 168 hours. No results for input(s): PROBNP in the last 168 hours.   CHEMISTRY  Recent Labs Lab 03/29/16 1442 03/29/16 2030 03/30/16 0156 03/31/16 0339  NA 138  --  138 138  K 3.7  --  3.7 3.7  CL 103  --  105 104  CO2 27  --  25 25  GLUCOSE 96  --  107* 105*  BUN 13  --  12 16  CREATININE 0.97 1.00 0.97 1.09  CALCIUM 9.5  --  9.1 9.1   Estimated Creatinine Clearance: 87.8 mL/min (by C-G formula based on SCr of 1.09 mg/dL).   LIVER  Recent Labs Lab 03/29/16 1442  INR 1.04     INFECTIOUS No results for input(s): LATICACIDVEN, PROCALCITON in the last 168 hours.   ENDOCRINE CBG (last 3)   Recent Labs  03/30/16 2228 03/31/16 0729 03/31/16 1114  GLUCAP 96 155* 124*         IMAGING x48h  - image(s) personally visualized  -   highlighted in bold Dg Chest Port 1 View  Result Date: 03/29/2016 CLINICAL DATA:  Wrist angina, abnormal cardiovascular stress test, history  of diabetes, former smoker EXAM: PORTABLE CHEST 1 VIEW COMPARISON:  Chest x-ray of October 09, 2010 and chest CT scan of April 05, 2015 FINDINGS: The lungs are well-expanded. The interstitial markings are coarse bilaterally. There is no alveolar infiltrate or pleural effusion. The pulmonary vascularity is not engorged. The heart is normal in size. The mediastinum is normal in width. The observed bony thorax is unremarkable. IMPRESSION: Mild hyperinflation consistent with COPD and the patient's smoking history. No pneumonia, CHF, nor other acute cardiopulmonary abnormality. Electronically Signed   By: David  Martinique M.D.   On: 03/29/2016 15:35       Pre-operative risk assessment [place] Postperative Pulmonary Risk Score for vent dependence at d3-6 Comment Score  Type of surgery - abd ao aneurysm (27), thoracic (21), neurosurgery / upper abdominal / vascular (21), neck (11) cabg 21  Emergency Surgery - (11) no   ALbumin < 3 or poor  nutritional state - (9) 3.11 in 2011 0  BUN > 30 -  (8) 16 0  Partial or completely dependent functional status - (7) functional 7  COPD -  (6) Gold stage 2 03/30/16 6  Age - 18 to 59 (4), > 70  (6) Age 28 4  TOTAL  38  Risk Stratifcation scores  - < 10, 11-19, 20-27, 28-40, >40  Mod-risk     CANET Postperative Pulmonary Risk Score  - any pulm complication Comment Score  Age - <50 (0), 50-80 (3), >80 (16) Age 104 3  Preoperative pulse ox - >96 (0), 91-95 (8), <90 (24) 95% rA 8  Respiratory infection in last month - Yes (17) no 0  Preoperative anemia - < 10gm% - Yes (11) 15.5 0  Surgical incision - Upper abdominal (15), Thoracic (24) thoracic 24  Duration of surgery - <2h (0), 2-3h (16), >3h (23) 2-3h 16  Emergency Surgery - Yes (8) no 0  TOTAL  51  Risk Stratification - Low (<26), Intermediate (26-44), High (>45)  high    POSTOP PNEUMONIA RISK -Gupta el at  4.5% risk for pneumonia post-op   OSA Mod COPD PREOP RESP EXAM Based on above  -Fairly high risk for any kind of pulmonary complication (CANET) I./e atelectasis, mild hypoxemia, pulm infiltarte - Moderate-High risk for prolonged vent dependence past several high days  -And some 4.5% risk for post op pneumonia - Clinical gestault: he can go through CABG but will need post op /Post extubation bipap and likely to suffere hypoxemia, atlecatciss. But he clearly needs CABG. So risk has to accepted. Risk is not prohibitive  PLAN Aggressive pulmonary toilet with o2, bronchodilatation, and incentive spirometry and early ambulation Nocturnal bipap/cpap pos op  Wife and he updated   PCCM will sign off but avail prn  Dr. Brand Males, M.D., [facility].C.P Pulmonary and Critical Care Medicine Staff Physician [place] [facility] Pager: [phone], If no answer or between  15:00h - 7:00h: call [phone]  03/31/2016 3:20 PM

## 2016-03-31 NOTE — Progress Notes (Signed)
[phone]   Perform pre op education on cardiac surgery with pt and pt's wife. Discussed what to expect after and during surgery. Discussed wound care, lifting and activity restrictions, bed mobility, sit to stands without the use of UE. Discussed importance of IS use and getting plenty of rest. Pt and wife voiced understanding.    David Brandt [employer]

## 2016-03-31 NOTE — Progress Notes (Signed)
ANTICOAGULATION CONSULT NOTE - Follow Up Consult  Pharmacy Consult for heparin Indication: severe 3V CAD  Allergies  Allergen Reactions  . Sulfa Antibiotics Hives    Patient Measurements: Height: 5\' 10"  (177.8 cm) Weight: 286 lb 3.2 oz (129.8 kg) IBW/kg (Calculated) : 73 Heparin Dosing Weight: 103 kg  Vital Signs: Temp: 98 F (36.7 C) (10/07 0430) Temp Source: Axillary (10/07 0430) BP: 131/72 (10/07 0430) Pulse Rate: 62 (10/07 0430)  Labs:  Recent Labs  03/29/16 1442 03/29/16 2030 03/30/16 0156 03/30/16 0406 03/30/16 1107 03/30/16 2135 03/31/16 0339  HGB 16.1 16.1 15.4 15.5  --   --  15.5  HCT 50.0 50.3 47.7 48.2  --   --  48.2  PLT 264 234 239 246  --   --  230  APTT 31  --   --   --   --   --   --   LABPROT 13.6  --   --   --   --   --   --   INR 1.04  --   --   --   --   --   --   HEPARINUNFRC  --   --   --   --  0.31 0.76* 0.74*  CREATININE 0.97 1.00 0.97  --   --   --  1.09    Estimated Creatinine Clearance: 87.8 mL/min (by C-G formula based on SCr of 1.09 mg/dL).   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 Heparin level 0.74 on 1450 units/hr. CBC stable. No bleeding noted.  Goal of Therapy:  Heparin level 0.3-0.7 units/ml Monitor platelets by anticoagulation protocol: Yes   Plan:  - Reduce heparin to 1300 units/hr - F/u 6hr heparin level  Sherlon Handing, PharmD, BCPS Clinical pharmacist, pager [phone] 03/31/2016,5:41 AM

## 2016-03-31 NOTE — Progress Notes (Signed)
Patient ID: Deontae Boxberger, male   DOB: January 11, 1946, 70 y.o.   MRN: [id number]      [address]       [place], [id number]             [phone]                 2 Days Post-Op Procedure(s) (LRB): Right/Left Heart Cath and Coronary Angiography (N/A) Intravascular Pressure Wire/FFR Study (N/A)  LOS: 2 days   Subjective: Laying flat in bed without SOB, notes improvement with inhalers last night and today  Objective: Vital signs in last 24 hours: Patient Vitals for the past 24 hrs:  BP Temp Temp src Pulse Resp SpO2  03/31/16 1143 - - - - - 95 %  03/31/16 1115 - 98.1 F (36.7 C) Oral - - -  03/31/16 1030 136/82 - - - - -  03/31/16 0809 - 98.2 F (36.8 C) Axillary - - -  03/31/16 0800 - - - - - 95 %  03/31/16 0430 131/72 98 F (36.7 C) Axillary 62 (!) 26 93 %  03/31/16 0030 (!) 141/78 97.9 F (36.6 C) Axillary 70 (!) 23 94 %  03/30/16 2235 (!) 156/75 - - 82 - -  03/30/16 2100 (!) 156/75 98.5 F (36.9 C) Oral 75 (!) 22 94 %  03/30/16 1610 139/82 98.2 F (36.8 C) Oral 62 (!) 25 94 %    Filed Weights   03/29/16 1356  Weight: 286 lb 3.2 oz (129.8 kg)    Hemodynamic parameters for last 24 hours:    Intake/Output from previous day: 10/06 0701 - 10/07 0700 In: 301.5 [P.O.:120; I.V.:181.5] Out: 1150 [Urine:1150] Intake/Output this shift: Total I/O In: 240 [P.O.:240] Out: 300 [Urine:300]  Scheduled Meds: . allopurinol  150 mg Oral Daily  . amLODipine  10 mg Oral Daily  . aspirin EC  81 mg Oral Daily  . atorvastatin  80 mg Oral QPM  . budesonide (PULMICORT) nebulizer solution  0.5 mg Nebulization BID  . furosemide  20 mg Intravenous Daily  . insulin aspart  0-5 Units Subcutaneous QHS  . insulin aspart  0-9 Units Subcutaneous TID WC  . ipratropium-albuterol  3 mL Nebulization Q4H WA  . metoprolol tartrate  25 mg Oral BID  . omega-3 acid ethyl esters  1 g Oral Daily  . sodium chloride flush  3 mL Intravenous Q12H  . sodium chloride flush  3 mL  Intravenous Q12H   Continuous Infusions: . heparin 1,100 Units/hr (03/31/16 1324)   PRN Meds:.sodium chloride, sodium chloride, ALPRAZolam, sodium chloride flush, sodium chloride flush, traMADol, zolpidem  General appearance: alert and cooperative Neurologic: intact Heart: regular rate and rhythm, S1, S2 normal, no murmur, click, rub or gallop Lungs: not wheezing now as was last night Abdomen: soft, non-tender; bowel sounds normal; no masses,  no organomegaly Extremities: extremities normal, atraumatic, no cyanosis or edema and Homans sign is negative, no sign of DVT  Lab Results: CBC: Recent Labs  03/30/16 0406 03/31/16 0339  WBC 12.8* 13.3*  HGB 15.5 15.5  HCT 48.2 48.2  PLT 246 230   BMET:  Recent Labs  03/30/16 0156 03/31/16 0339  NA 138 138  K 3.7 3.7  CL 105 104  CO2 25 25  GLUCOSE 107* 105*  BUN 12 16  CREATININE 0.97 1.09  CALCIUM 9.1 9.1    PT/INR:  Recent Labs  03/29/16 1442  LABPROT 13.6  INR 1.04     Radiology Dg Chest  Port 1 View  Result Date: 03/29/2016 CLINICAL DATA:  Wrist angina, abnormal cardiovascular stress test, history of diabetes, former smoker EXAM: PORTABLE CHEST 1 VIEW COMPARISON:  Chest x-ray of October 09, 2010 and chest CT scan of April 05, 2015 FINDINGS: The lungs are well-expanded. The interstitial markings are coarse bilaterally. There is no alveolar infiltrate or pleural effusion. The pulmonary vascularity is not engorged. The heart is normal in size. The mediastinum is normal in width. The observed bony thorax is unremarkable. IMPRESSION: Mild hyperinflation consistent with COPD and the patient's smoking history. No pneumonia, CHF, nor other acute cardiopulmonary abnormality. Electronically Signed   By: David  Martinique M.D.   On: 03/29/2016 15:35     Assessment/Plan: S/P Procedure(s) (LRB): Right/Left Heart Cath and Coronary Angiography (N/A) Intravascular Pressure Wire/FFR Study (N/A) Mobilize Considering CABG Monday Risks  and options dicussed with patient and wife today  [name] MD 03/31/2016 2:34 PM

## 2016-03-31 NOTE — Progress Notes (Addendum)
Pre-op Cardiac Surgery  Carotid Findings:  Findings suggest 1-39% internal carotid artery stenosis bilaterally. Vertebral arteries are patent with antegrade flow.  Upper Extremity Right Left  Brachial Pressures 140-Triphasic 123-Triphasic  Radial Waveforms Monophasic Triphasic  Ulnar Waveforms Triphasic Triphasic  Palmar Arch (Allen's Test) Signal is unaffected with radial compression, obliterates with ulnar compression. Signal decreases <50% with radial compression, decreases >50% with ulnar compression.   Lower  Extremity Right Left  Dorsalis Pedis 141-Triphasic 137-Triphasic  Anterior Tibial    Posterior Tibial 92-Dampened monophasic 146-Triphasic  Ankle/Brachial Indices 1.01 1.04    Findings:   Bilateral dorsalis pedis and left posterior tibial arteries exhibit ABIs that are within normal limits at rest. The right posterior tibial artery ABI is suggestive of moderate arterial insufficiency at rest.  03/31/2016 9:17 AM Maudry Mayhew, BS, RVT, RDCS, RDMS

## 2016-03-31 NOTE — Progress Notes (Signed)
ANTICOAGULATION CONSULT NOTE - Follow Up Consult  Pharmacy Consult for heparin Indication: severe 3V CAD  Allergies  Allergen Reactions  . Sulfa Antibiotics Hives    Patient Measurements: Height: 5\' 10"  (177.8 cm) Weight: 286 lb 3.2 oz (129.8 kg) IBW/kg (Calculated) : 73 Heparin Dosing Weight: 103 kg  Vital Signs: Temp: 98.1 F (36.7 C) (10/07 1115) Temp Source: Oral (10/07 1115) BP: 136/82 (10/07 1030) Pulse Rate: 62 (10/07 0430)  Labs:  Recent Labs  03/29/16 1442 03/29/16 2030 03/30/16 0156 03/30/16 0406  03/30/16 2135 03/31/16 0339 03/31/16 1136  HGB 16.1 16.1 15.4 15.5  --   --  15.5  --   HCT 50.0 50.3 47.7 48.2  --   --  48.2  --   PLT 264 234 239 246  --   --  230  --   APTT 31  --   --   --   --   --   --   --   LABPROT 13.6  --   --   --   --   --   --   --   INR 1.04  --   --   --   --   --   --   --   HEPARINUNFRC  --   --   --   --   < > 0.76* 0.74* 0.78*  CREATININE 0.97 1.00 0.97  --   --   --  1.09  --   < > = values in this interval not displayed.  Estimated Creatinine Clearance: 87.8 mL/min (by C-G formula based on SCr of 1.09 mg/dL).   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 Heparin level 0.78, remains supratherapeutic after rate reduction this morning. on 1450 units/hr. CBC stable. No bleeding noted. Level was drawn from opposite arm.   Goal of Therapy:  Heparin level 0.3-0.7 units/ml Monitor platelets by anticoagulation protocol: Yes   Plan:  - Reduce heparin to 1100 units/hr - F/u 6hr heparin level @ 2000 - Daily heparin level and CBC  Maryanna Shape, PharmD, BCPS  Clinical Pharmacist  Pager: [phone]   03/31/2016,1:18 PM

## 2016-03-31 NOTE — Plan of Care (Signed)
Problem: Phase I Progression Outcomes Goal: Other Phase I Outcomes/Goals Outcome: Progressing VSS, site remains C,D,I with no s/s of complications, preparing patient for Open Heart surgery tentatively for Monday.

## 2016-03-31 NOTE — Progress Notes (Signed)
Subjective:  Shortness of breath is better overnight.  Awaiting bypass grafting for left main and three-vessel disease on Monday.  Pulmonary consultation noted.  No ischemic chest pain overnight.  Objective:  Vital Signs in the last 24 hours: BP 131/72 (BP Location: Left Leg)   Pulse 62   Temp 98 F (36.7 C) (Axillary)   Resp (!) 26   Ht 5\' 10"  (1.778 m)   Wt 129.8 kg (286 lb 3.2 oz)   SpO2 95%   BMI 41.07 kg/m   Physical Exam: Pleasant obese African-American male in no acute distress Lungs:  No wheezing today.  Cardiac:  Regular rhythm, normal S1 and S2, no S3 Abdomen:  Soft, nontender, no masses Extremities:  No edema present, both radial sites clean and dry  Intake/Output from previous day: 10/06 0701 - 10/07 0700 In: 301.5 [P.O.:120; I.V.:181.5] Out: 1150 [Urine:1150]  Weight Filed Weights   03/29/16 1356  Weight: 129.8 kg (286 lb 3.2 oz)    Lab Results: Basic Metabolic Panel:  Recent Labs  03/30/16 0156 03/31/16 0339  NA 138 138  K 3.7 3.7  CL 105 104  CO2 25 25  GLUCOSE 107* 105*  BUN 12 16  CREATININE 0.97 1.09   CBC:  Recent Labs  03/29/16 1442  03/30/16 0406 03/31/16 0339  WBC 14.3*  < > 12.8* 13.3*  NEUTROABS 9.6*  --   --   --   HGB 16.1  < > 15.5 15.5  HCT 50.0  < > 48.2 48.2  MCV 90.3  < > 91.1 91.3  PLT 264  < > 246 230  < > = values in this interval not displayed.  Telemetry: Sinus rhythm  Assessment/Plan:  1.  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  COPD with wheezing 3.  Hypertension controlled 4.  Anticoagulation with heparin with no complications  Recommendations:  Pulmonary consultation noted.  Continue respiratory treatments continue intravenous heparin and plan surgery on Monday.  Questions about surgery answered.     Kerry Hough  MD [facility]  03/31/2016, 8:06 AM

## 2016-03-31 NOTE — Progress Notes (Signed)
ANTICOAGULATION CONSULT NOTE - Follow Up Consult  Pharmacy Consult for Heparin Indication: severe 3V CAD  Allergies  Allergen Reactions  . Sulfa Antibiotics Hives    Patient Measurements: Height: 5\' 10"  (177.8 cm) Weight: 286 lb 3.2 oz (129.8 kg) IBW/kg (Calculated) : 73 Heparin Dosing Weight: 103kg  Vital Signs: Temp: 98.8 F (37.1 C) (10/07 2100) Temp Source: Oral (10/07 2100) BP: 136/82 (10/07 1030)  Labs:  Recent Labs  03/29/16 1442 03/29/16 2030 03/30/16 0156 03/30/16 0406  03/31/16 0339 03/31/16 1136 03/31/16 2035  HGB 16.1 16.1 15.4 15.5  --  15.5  --   --   HCT 50.0 50.3 47.7 48.2  --  48.2  --   --   PLT 264 234 239 246  --  230  --   --   APTT 31  --   --   --   --   --   --   --   LABPROT 13.6  --   --   --   --   --   --   --   INR 1.04  --   --   --   --   --   --   --   HEPARINUNFRC  --   --   --   --   < > 0.74* 0.78* 0.58  CREATININE 0.97 1.00 0.97  --   --  1.09  --   --   < > = values in this interval not displayed.  Estimated Creatinine Clearance: 87.8 mL/min (by C-G formula based on SCr of 1.09 mg/dL).   Medications:  Heparin @ 1100 units/hr  Assessment: 70yom continues on heparin for severe 3V CAD. Heparin level is now therapeutic at 0.58 after rate decrease earlier today.  Goal of Therapy:  Heparin level 0.3-0.7 units/ml Monitor platelets by anticoagulation protocol: Yes   Plan:  1) Continue heparin at 1100 units/hr 2) Daily heparin level and CBC  Deboraha Sprang 03/31/2016,9:23 PM

## 2016-03-31 NOTE — Progress Notes (Signed)
Report received in patient's room via Rey RN using [employer], reviewed orders, labs, VS, meds and patient's general condition, assumed care of patient.

## 2016-03-31 NOTE — Plan of Care (Signed)
Problem: Phase I Progression Outcomes Goal: Post Cath/PCI return to appropriate Path Outcome: Progressing Patient will remain in the hospital over the weekend for evaluation and more testing in preparation for Open Heart Surgery scheduled tentatively for Monday, will continue to monitor.

## 2016-04-01 LAB — PROTIME-INR
INR: 1.04
PROTHROMBIN TIME: 13.6 s (ref 11.4–15.2)

## 2016-04-01 LAB — TYPE AND SCREEN
ABO/RH(D): B POS
Antibody Screen: NEGATIVE

## 2016-04-01 LAB — COMPREHENSIVE METABOLIC PANEL
ALT: 28 U/L (ref 17–63)
AST: 29 U/L (ref 15–41)
Albumin: 3.4 g/dL — ABNORMAL LOW (ref 3.5–5.0)
Alkaline Phosphatase: 89 U/L (ref 38–126)
Anion gap: 11 (ref 5–15)
BUN: 18 mg/dL (ref 6–20)
CO2: 23 mmol/L (ref 22–32)
Calcium: 9 mg/dL (ref 8.9–10.3)
Chloride: 102 mmol/L (ref 101–111)
Creatinine, Ser: 1.08 mg/dL (ref 0.61–1.24)
GFR calc Af Amer: >60 mL/min (ref >60)
GFR calc non Af Amer: >60 mL/min (ref >60)
Glucose, Bld: 108 mg/dL — ABNORMAL HIGH (ref 65–99)
Potassium: 3.8 mmol/L (ref 3.5–5.1)
Sodium: 136 mmol/L (ref 135–145)
Total Bilirubin: 0.6 mg/dL (ref 0.3–1.2)
Total Protein: 6.3 g/dL — ABNORMAL LOW (ref 6.5–8.1)

## 2016-04-01 LAB — CBC
HEMATOCRIT: 47.8 % (ref 39.0–52.0)
HEMOGLOBIN: 15.2 g/dL (ref 13.0–17.0)
MCH: 29.1 pg (ref 26.0–34.0)
MCHC: 31.8 g/dL (ref 30.0–36.0)
MCV: 91.6 fL (ref 78.0–100.0)
Platelets: 243 10*3/uL (ref 150–400)
RBC: 5.22 MIL/uL (ref 4.22–5.81)
RDW: 15.1 % (ref 11.5–15.5)
WBC: 12.7 10*3/uL — AB (ref 4.0–10.5)

## 2016-04-01 LAB — HEMOGLOBIN A1C
Hgb A1c MFr Bld: 6.3 % — ABNORMAL HIGH (ref 4.8–5.6)
Mean Plasma Glucose: 134 mg/dL

## 2016-04-01 LAB — GLUCOSE, CAPILLARY
GLUCOSE-CAPILLARY: 97 mg/dL (ref 65–99)
Glucose-Capillary: 115 mg/dL — ABNORMAL HIGH (ref 65–99)
Glucose-Capillary: 149 mg/dL — ABNORMAL HIGH (ref 65–99)
Glucose-Capillary: 139 mg/dL — ABNORMAL HIGH (ref 65–99)

## 2016-04-01 LAB — ABO/RH: ABO/RH(D): B POS

## 2016-04-01 LAB — HEPARIN LEVEL (UNFRACTIONATED): Heparin Unfractionated: 0.34 IU/mL (ref 0.30–0.70)

## 2016-04-01 MED ORDER — NITROGLYCERIN IN D5W 200-5 MCG/ML-% IV SOLN
2.0000 ug/min | INTRAVENOUS | Status: DC
  Administered 2016-04-02: 5 ug/min via INTRAVENOUS
  Filled 2016-04-01: qty 250

## 2016-04-01 MED ORDER — SODIUM CHLORIDE 0.9 % IV SOLN
INTRAVENOUS | Status: DC
  Administered 2016-04-02: 1 [IU]/h via INTRAVENOUS
  Filled 2016-04-01: qty 2.5

## 2016-04-01 MED ORDER — CHLORHEXIDINE GLUCONATE CLOTH 2 % EX PADS
6.0000 | MEDICATED_PAD | Freq: Once | CUTANEOUS | Status: AC
  Administered 2016-04-02: 6 via TOPICAL

## 2016-04-01 MED ORDER — DEXMEDETOMIDINE HCL IN NACL 400 MCG/100ML IV SOLN
0.1000 ug/kg/h | INTRAVENOUS | Status: DC
  Administered 2016-04-02: .2 ug/kg/h via INTRAVENOUS
  Filled 2016-04-01: qty 100

## 2016-04-01 MED ORDER — EPINEPHRINE HCL 1 MG/ML IJ SOLN
0.0000 ug/min | INTRAMUSCULAR | Status: DC
  Filled 2016-04-01: qty 4

## 2016-04-01 MED ORDER — CHLORHEXIDINE GLUCONATE CLOTH 2 % EX PADS
6.0000 | MEDICATED_PAD | Freq: Once | CUTANEOUS | Status: AC
  Administered 2016-04-01: 6 via TOPICAL

## 2016-04-01 MED ORDER — TEMAZEPAM 15 MG PO CAPS
15.0000 mg | ORAL_CAPSULE | Freq: Once | ORAL | Status: DC | PRN
Start: 2016-04-01 — End: 2016-04-02

## 2016-04-01 MED ORDER — PLASMA-LYTE 148 IV SOLN
INTRAVENOUS | Status: DC
  Filled 2016-04-01: qty 2.5

## 2016-04-01 MED ORDER — POTASSIUM CHLORIDE 2 MEQ/ML IV SOLN
80.0000 meq | INTRAVENOUS | Status: DC
  Filled 2016-04-01: qty 40

## 2016-04-01 MED ORDER — MAGNESIUM SULFATE 50 % IJ SOLN
40.0000 meq | INTRAMUSCULAR | Status: DC
  Filled 2016-04-01: qty 10

## 2016-04-01 MED ORDER — DOPAMINE-DEXTROSE 3.2-5 MG/ML-% IV SOLN
0.0000 ug/kg/min | INTRAVENOUS | Status: DC
  Filled 2016-04-01: qty 250

## 2016-04-01 MED ORDER — CHLORHEXIDINE GLUCONATE 0.12 % MT SOLN
15.0000 mL | Freq: Once | OROMUCOSAL | Status: AC
  Administered 2016-04-02: 15 mL via OROMUCOSAL
  Filled 2016-04-01: qty 15

## 2016-04-01 MED ORDER — SODIUM CHLORIDE 0.9 % IV SOLN
INTRAVENOUS | Status: DC
  Filled 2016-04-01: qty 30

## 2016-04-01 MED ORDER — TRANEXAMIC ACID 1000 MG/10ML IV SOLN
1.5000 mg/kg/h | INTRAVENOUS | Status: DC
  Administered 2016-04-02: 1.5 mg/kg/h via INTRAVENOUS
  Filled 2016-04-01: qty 25

## 2016-04-01 MED ORDER — TRANEXAMIC ACID (OHS) PUMP PRIME SOLUTION
2.0000 mg/kg | INTRAVENOUS | Status: DC
  Filled 2016-04-01: qty 2.61

## 2016-04-01 MED ORDER — METOPROLOL TARTRATE 12.5 MG HALF TABLET
12.5000 mg | ORAL_TABLET | Freq: Once | ORAL | Status: AC
  Administered 2016-04-02: 12.5 mg via ORAL
  Filled 2016-04-01: qty 1

## 2016-04-01 MED ORDER — BISACODYL 5 MG PO TBEC: 5.0000 mg | DELAYED_RELEASE_TABLET | Freq: Once | ORAL | Status: DC

## 2016-04-01 MED ORDER — PHENYLEPHRINE HCL 10 MG/ML IJ SOLN
30.0000 ug/min | INTRAVENOUS | Status: DC
  Filled 2016-04-01: qty 2

## 2016-04-01 MED ORDER — MAGNESIUM HYDROXIDE 400 MG/5ML PO SUSP
30.0000 mL | Freq: Every day | ORAL | Status: DC | PRN
  Administered 2016-04-01: 30 mL via ORAL
  Filled 2016-04-01: qty 30

## 2016-04-01 MED ORDER — TRANEXAMIC ACID (OHS) BOLUS VIA INFUSION
15.0000 mg/kg | INTRAVENOUS | Status: DC
  Filled 2016-04-01: qty 1956

## 2016-04-01 MED ORDER — DEXTROSE 5 % IV SOLN
750.0000 mg | INTRAVENOUS | Status: DC
  Filled 2016-04-01: qty 750

## 2016-04-01 MED ORDER — DEXTROSE 5 % IV SOLN
1.5000 g | INTRAVENOUS | Status: DC
  Filled 2016-04-01: qty 1.5

## 2016-04-01 MED ORDER — VANCOMYCIN HCL 10 G IV SOLR
1500.0000 mg | INTRAVENOUS | Status: DC
  Administered 2016-04-02: 1500 mg via INTRAVENOUS
  Filled 2016-04-01: qty 1500

## 2016-04-01 NOTE — Progress Notes (Signed)
Updated report received via Costella Hatcher RN in patient's room using SBAR format, assumed care of patient.

## 2016-04-01 NOTE — Progress Notes (Signed)
Subjective:   No ischemic chest pain overnight.  Breathing is improved.    Objective:  Vital Signs in the last 24 hours: BP 132/76 (BP Location: Left Leg)   Pulse 76   Temp 97.7 F (36.5 C) (Axillary)   Resp 15   Ht 5\' 10"  (1.778 m)   Wt 130.4 kg (287 lb 8 oz)   SpO2 97%   BMI 41.25 kg/m   Physical Exam: Pleasant obese African-American male in no acute distress Lungs:  Soft bilateral wheezing noted today Cardiac:  Regular rhythm, normal S1 and S2, no S3 Abdomen:  Soft, nontender, no masses Extremities:  No edema present, both radial sites clean and dry  Intake/Output from previous day: 10/07 0701 - 10/08 0700 In: 1103 [P.O.:960; I.V.:143] Out: 1400 [Urine:1400]  Weight Filed Weights   03/29/16 1356 04/01/16 0440  Weight: 129.8 kg (286 lb 3.2 oz) 130.4 kg (287 lb 8 oz)    Lab Results: Basic Metabolic Panel:  Recent Labs  03/31/16 0339 04/01/16 0502  NA 138 136  K 3.7 3.8  CL 104 102  CO2 25 23  GLUCOSE 105* 108*  BUN 16 18  CREATININE 1.09 1.08   CBC:  Recent Labs  03/29/16 1442  03/31/16 0339 04/01/16 0502  WBC 14.3*  < > 13.3* 12.7*  NEUTROABS 9.6*  --   --   --   HGB 16.1  < > 15.5 15.2  HCT 50.0  < > 48.2 47.8  MCV 90.3  < > 91.3 91.6  PLT 264  < > 230 243  < > = values in this interval not displayed.  Telemetry: Sinus rhythm  Assessment/Plan:  1.  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  COPD with wheezing but better 3.  Hypertension controlled 4.  Anticoagulation with heparin with no complications  Recommendations:  No concerns about surgery noted.  Questions answered.  Continue anticoagulation.  Continue pulmonary treatment.      Kerry Hough  MD [facility]  04/01/2016, 8:32 AM

## 2016-04-01 NOTE — Progress Notes (Signed)
ANTICOAGULATION CONSULT NOTE - Follow Up Consult  Pharmacy Consult for Heparin Indication: severe 3V CAD  Allergies  Allergen Reactions  . Sulfa Antibiotics Hives    Patient Measurements: Height: 5\' 10"  (177.8 cm) Weight: 287 lb 8 oz (130.4 kg) IBW/kg (Calculated) : 73 Heparin Dosing Weight: 103kg  Vital Signs: Temp: 97.7 F (36.5 C) (10/08 0737) Temp Source: Axillary (10/08 0737) BP: 132/76 (10/08 0737) Pulse Rate: 76 (10/08 0737)  Labs:  Recent Labs  03/29/16 1442  03/30/16 0156 03/30/16 0406  03/31/16 0339 03/31/16 1136 03/31/16 2035 04/01/16 0502  HGB 16.1  < > 15.4 15.5  --  15.5  --   --  15.2  HCT 50.0  < > 47.7 48.2  --  48.2  --   --  47.8  PLT 264  < > 239 246  --  230  --   --  243  APTT 31  --   --   --   --   --   --   --   --   LABPROT 13.6  --   --   --   --   --   --   --  13.6  INR 1.04  --   --   --   --   --   --   --  1.04  HEPARINUNFRC  --   --   --   --   < > 0.74* 0.78* 0.58 0.34  CREATININE 0.97  < > 0.97  --   --  1.09  --   --  1.08  < > = values in this interval not displayed.  Estimated Creatinine Clearance: 88.9 mL/min (by C-G formula based on SCr of 1.08 mg/dL).   Medications:  Heparin @ 1100 units/hr  Assessment: 70yom continues on heparin for severe 3V CAD, for CABG tomorrow AM. Heparin level is 0.34, low-end therapeutic. CBC stable No bleeding noted per chart.  Goal of Therapy:  Heparin level 0.3-0.7 units/ml Monitor platelets by anticoagulation protocol: Yes   Plan:  1) Increase heparin rate slightly to 1150 units/hr 2) Daily heparin level and CBC  Maryanna Shape, PharmD, BCPS  Clinical Pharmacist  Pager: [phone]   04/01/2016,8:53 AM

## 2016-04-01 NOTE — Anesthesia Preprocedure Evaluation (Addendum)
Anesthesia Evaluation  Patient identified by MRN, date of birth, ID band Patient awake    Reviewed: Allergy & Precautions, NPO status , Patient's Chart, lab work & pertinent test results  Airway Mallampati: II       Dental  (+) Edentulous Upper, Edentulous Lower   Pulmonary sleep apnea and Continuous Positive Airway Pressure Ventilation , COPD, former smoker,  High risk for prolonged post op ventilation need, assessed by pulmonology preop    + decreased breath sounds+ wheezing      Cardiovascular hypertension, + angina  Rhythm:Regular Rate:Normal     Neuro/Psych    GI/Hepatic   Endo/Other  diabetesMorbid obesity  Renal/GU      Musculoskeletal   Abdominal   Peds  Hematology   Anesthesia Other Findings   Reproductive/Obstetrics                         Anesthesia Physical Anesthesia Plan  ASA: IV  Anesthesia Plan: General   Post-op Pain Management:    Induction: Intravenous  Airway Management Planned: Oral ETT  Additional Equipment: PA Cath, Ultrasound Guidance Line Placement, CVP, Arterial line and TEE  Intra-op Plan:   Post-operative Plan: Post-operative intubation/ventilation  Informed Consent: I have reviewed the patients History and Physical, chart, labs and discussed the procedure including the risks, benefits and alternatives for the proposed anesthesia with the patient or authorized representative who has indicated his/her understanding and acceptance.     Plan Discussed with: Anesthesiologist, CRNA and Surgeon  Anesthesia Plan Comments: (Plan for right radial A line given vascular dopplers)      Anesthesia Quick Evaluation

## 2016-04-01 NOTE — Progress Notes (Signed)
Patient ID: David Brandt, male   DOB: 10/30/1945, 70 y.o.   MRN: [id number]      [address]       [place] [address]             [phone]                 3 Days Post-Op Procedure(s) (LRB): Right/Left Heart Cath and Coronary Angiography (N/A) Intravascular Pressure Wire/FFR Study (N/A)  LOS: 3 days   Subjective: Feels better , notes breathing is better  Objective: Vital signs in last 24 hours: Patient Vitals for the past 24 hrs:  BP Temp Temp src Pulse Resp SpO2 Weight  04/01/16 1137 - - - - - 97 % -  04/01/16 1125 123/63 98.9 F (37.2 C) Oral 78 19 93 % -  04/01/16 0924 - - - 72 - - -  04/01/16 0923 114/67 - - - - - -  04/01/16 0807 - - - - - 97 % -  04/01/16 0737 132/76 97.7 F (36.5 C) Axillary 76 15 97 % -  04/01/16 0515 128/67 - - - (!) 8 92 % -  04/01/16 0440 - 97.8 F (36.6 C) Axillary - - - 287 lb 8 oz (130.4 kg)  04/01/16 0432 - - - - - 95 % -  04/01/16 0015 136/68 97.6 F (36.4 C) Axillary - (!) 24 93 % -  04/01/16 0005 - - - 74 (!) 22 96 % -  03/31/16 2211 (!) 153/84 - - 72 - - -  03/31/16 2100 - 98.8 F (37.1 C) Oral - - - -  03/31/16 2055 (!) 153/84 - - - (!) 22 93 % -  03/31/16 1900 - - - - - 96 % -  03/31/16 1619 - - - - - 95 % -  03/31/16 1616 - 98.8 F (37.1 C) Oral - - - -    Filed Weights   03/29/16 1356 04/01/16 0440  Weight: 286 lb 3.2 oz (129.8 kg) 287 lb 8 oz (130.4 kg)    Hemodynamic parameters for last 24 hours:    Intake/Output from previous day: 10/07 0701 - 10/08 0700 In: 1103 [P.O.:960; I.V.:143] Out: 1400 [Urine:1400] Intake/Output this shift: Total I/O In: -  Out: 300 [Urine:300]  Scheduled Meds: . allopurinol  150 mg Oral Daily  . amLODipine  10 mg Oral Daily  . aspirin EC  81 mg Oral Daily  . atorvastatin  80 mg Oral QPM  . budesonide (PULMICORT) nebulizer solution  0.5 mg Nebulization BID  . furosemide  20 mg Intravenous Daily  . insulin aspart  0-5 Units Subcutaneous QHS  . insulin aspart   0-9 Units Subcutaneous TID WC  . ipratropium-albuterol  3 mL Nebulization Q4H WA  . metoprolol tartrate  25 mg Oral BID  . omega-3 acid ethyl esters  1 g Oral Daily  . sodium chloride flush  3 mL Intravenous Q12H  . sodium chloride flush  3 mL Intravenous Q12H   Continuous Infusions: . heparin 1,150 Units/hr (04/01/16 0927)   PRN Meds:.sodium chloride, sodium chloride, ALPRAZolam, magnesium hydroxide, sodium chloride flush, sodium chloride flush, traMADol, zolpidem  General appearance: alert and cooperative Neurologic: intact Heart: regular rate and rhythm, S1, S2 normal, no murmur, click, rub or gallop Lungs: diminished breath sounds bibasilar Abdomen: soft, non-tender; bowel sounds normal; no masses,  no organomegaly Extremities: extremities normal, atraumatic, no cyanosis or edema and Homans sign is negative, no sign of DVT Not wheezing ,  just had resp treatment  Lab Results: CBC: Recent Labs  03/31/16 0339 04/01/16 0502  WBC 13.3* 12.7*  HGB 15.5 15.2  HCT 48.2 47.8  PLT 230 243   BMET:  Recent Labs  03/31/16 0339 04/01/16 0502  NA 138 136  K 3.7 3.8  CL 104 102  CO2 25 23  GLUCOSE 105* 108*  BUN 16 18  CREATININE 1.09 1.08  CALCIUM 9.1 9.0    PT/INR:  Recent Labs  04/01/16 0502  LABPROT 13.6  INR 1.04     Radiology No results found.   Assessment/Plan: S/P Procedure(s) (LRB): Right/Left Heart Cath and Coronary Angiography (N/A) Intravascular Pressure Wire/FFR Study (N/A)    Plan CABG tomorrow. Risks and options and options dicussed, also has seen pulmonary for preop evlauation The goals risks and alternatives of the planned surgical procedure CABG  have been discussed with the patient in detail. The risks of the procedure including death, infection, stroke, myocardial infarction, bleeding, blood transfusion have all been discussed specifically.  I have quoted [name] a 4 % of perioperative mortality and a complication rate as high as 40  %. [name] questions have been answered.[name] is willing  to proceed with the planned procedure.   [name] MD 04/01/2016 12:07 PM

## 2016-04-02 ENCOUNTER — Encounter (HOSPITAL_COMMUNITY)
Admission: AD | Disposition: A | Discharge status: Home / Self Care | Payer: Self-pay | Source: Ambulatory Visit | Attending: Cardiothoracic Surgery

## 2016-04-02 ENCOUNTER — Inpatient Hospital Stay (HOSPITAL_COMMUNITY): Payer: Medicare PPO

## 2016-04-02 ENCOUNTER — Other Ambulatory Visit: Payer: Self-pay

## 2016-04-02 ENCOUNTER — Inpatient Hospital Stay (HOSPITAL_COMMUNITY): Payer: Medicare PPO | Admitting: Anesthesiology

## 2016-04-02 ENCOUNTER — Encounter (HOSPITAL_COMMUNITY): Payer: Self-pay | Admitting: Anesthesiology

## 2016-04-02 DIAGNOSIS — Z951 Presence of aortocoronary bypass graft: Secondary | ICD-10-CM

## 2016-04-02 HISTORY — PX: CARDIAC CATHETERIZATION: SHX172

## 2016-04-02 HISTORY — PX: CORONARY ARTERY BYPASS GRAFT: SHX141

## 2016-04-02 HISTORY — PX: LYMPH NODE BIOPSY: SHX201

## 2016-04-02 HISTORY — PX: TEE WITHOUT CARDIOVERSION: SHX5443

## 2016-04-02 LAB — POCT I-STAT 3, ART BLOOD GAS (G3+)
ACID-BASE DEFICIT: 4 mmol/L — AB (ref 0.0–2.0)
ACID-BASE DEFICIT: 5 mmol/L — AB (ref 0.0–2.0)
ACID-BASE DEFICIT: 2 mmol/L (ref 0.0–2.0)
Acid-base deficit: 1 mmol/L (ref 0.0–2.0)
Acid-base deficit: 8 mmol/L — ABNORMAL HIGH (ref 0.0–2.0)
Acid-base deficit: 3 mmol/L — ABNORMAL HIGH (ref 0.0–2.0)
Acid-base deficit: 6 mmol/L — ABNORMAL HIGH (ref 0.0–2.0)
Acid-base deficit: 3 mmol/L — ABNORMAL HIGH (ref 0.0–2.0)
BICARBONATE: 25.7 mmol/L (ref 20.0–28.0)
BICARBONATE: 23.5 mmol/L (ref 20.0–28.0)
BICARBONATE: 23.7 mmol/L (ref 20.0–28.0)
BICARBONATE: 23.4 mmol/L (ref 20.0–28.0)
BICARBONATE: 21.6 mmol/L (ref 20.0–28.0)
Bicarbonate: 23.7 mmol/L (ref 20.0–28.0)
Bicarbonate: 20.2 mmol/L (ref 20.0–28.0)
Bicarbonate: 20.1 mmol/L (ref 20.0–28.0)
O2 SAT: 100 %
O2 SAT: 100 %
O2 SAT: 90 %
O2 SAT: 89 %
O2 SAT: 92 %
O2 Saturation: 88 %
O2 Saturation: 98 %
O2 Saturation: 94 %
PCO2 ART: 52.3 mmHg — AB (ref 32.0–48.0)
PCO2 ART: 50.4 mmHg — AB (ref 32.0–48.0)
PCO2 ART: 47.3 mmHg (ref 32.0–48.0)
PCO2 ART: 38.6 mmHg (ref 32.0–48.0)
PCO2 ART: 36.6 mmHg (ref 32.0–48.0)
PH ART: 7.191 — AB (ref 7.350–7.450)
PH ART: 7.204 — AB (ref 7.350–7.450)
PH ART: 7.3 — AB (ref 7.350–7.450)
PH ART: 7.38 (ref 7.350–7.450)
PO2 ART: 199 mmHg — AB (ref 83.0–108.0)
PO2 ART: 108 mmHg (ref 83.0–108.0)
PO2 ART: 70 mmHg — AB (ref 83.0–108.0)
PO2 ART: 66 mmHg — AB (ref 83.0–108.0)
Patient temperature: 36.5
Patient temperature: 36.5
Patient temperature: 37.2
TCO2: 27 mmol/L (ref 0–100)
TCO2: 25 mmol/L (ref 0–100)
TCO2: 22 mmol/L (ref 0–100)
TCO2: 25 mmol/L (ref 0–100)
TCO2: 25 mmol/L (ref 0–100)
TCO2: 25 mmol/L (ref 0–100)
TCO2: 21 mmol/L (ref 0–100)
TCO2: 23 mmol/L (ref 0–100)
pCO2 arterial: 52.8 mmHg — ABNORMAL HIGH (ref 32.0–48.0)
pCO2 arterial: 60.4 mmHg — ABNORMAL HIGH (ref 32.0–48.0)
pCO2 arterial: 40.5 mmHg (ref 32.0–48.0)
pH, Arterial: 7.296 — ABNORMAL LOW (ref 7.350–7.450)
pH, Arterial: 7.264 — ABNORMAL LOW (ref 7.350–7.450)
pH, Arterial: 7.368 (ref 7.350–7.450)
pH, Arterial: 7.323 — ABNORMAL LOW (ref 7.350–7.450)
pO2, Arterial: 286 mmHg — ABNORMAL HIGH (ref 83.0–108.0)
pO2, Arterial: 65 mmHg — ABNORMAL LOW (ref 83.0–108.0)
pO2, Arterial: 68 mmHg — ABNORMAL LOW (ref 83.0–108.0)
pO2, Arterial: 58 mmHg — ABNORMAL LOW (ref 83.0–108.0)

## 2016-04-02 LAB — POCT I-STAT 4, (NA,K, GLUC, HGB,HCT)
GLUCOSE: 153 mg/dL — AB (ref 65–99)
HCT: 33 % — ABNORMAL LOW (ref 39.0–52.0)
HEMOGLOBIN: 11.2 g/dL — AB (ref 13.0–17.0)
POTASSIUM: 4.2 mmol/L (ref 3.5–5.1)
Sodium: 141 mmol/L (ref 135–145)

## 2016-04-02 LAB — BASIC METABOLIC PANEL
ANION GAP: 10 (ref 5–15)
BUN: 16 mg/dL (ref 6–20)
CHLORIDE: 103 mmol/L (ref 101–111)
CO2: 24 mmol/L (ref 22–32)
Calcium: 9 mg/dL (ref 8.9–10.3)
Creatinine, Ser: 1.13 mg/dL (ref 0.61–1.24)
GFR calc non Af Amer: >60 mL/min (ref >60)
GLUCOSE: 132 mg/dL — AB (ref 65–99)
Potassium: 3.9 mmol/L (ref 3.5–5.1)
Sodium: 137 mmol/L (ref 135–145)

## 2016-04-02 LAB — POCT I-STAT, CHEM 8
BUN: 18 mg/dL (ref 6–20)
BUN: 17 mg/dL (ref 6–20)
BUN: 15 mg/dL (ref 6–20)
BUN: 15 mg/dL (ref 6–20)
BUN: 16 mg/dL (ref 6–20)
BUN: 17 mg/dL (ref 6–20)
BUN: 17 mg/dL (ref 6–20)
BUN: 15 mg/dL (ref 6–20)
CALCIUM ION: 1.02 mmol/L — AB (ref 1.15–1.40)
CALCIUM ION: 1.06 mmol/L — AB (ref 1.15–1.40)
CHLORIDE: 103 mmol/L (ref 101–111)
CHLORIDE: 102 mmol/L (ref 101–111)
CHLORIDE: 99 mmol/L — AB (ref 101–111)
CHLORIDE: 100 mmol/L — AB (ref 101–111)
CHLORIDE: 101 mmol/L (ref 101–111)
CHLORIDE: 103 mmol/L (ref 101–111)
CHLORIDE: 104 mmol/L (ref 101–111)
CREATININE: 0.8 mg/dL (ref 0.61–1.24)
CREATININE: 0.8 mg/dL (ref 0.61–1.24)
CREATININE: 0.8 mg/dL (ref 0.61–1.24)
CREATININE: 1 mg/dL (ref 0.61–1.24)
CREATININE: 1 mg/dL (ref 0.61–1.24)
CREATININE: 0.9 mg/dL (ref 0.61–1.24)
Calcium, Ion: 1.2 mmol/L (ref 1.15–1.40)
Calcium, Ion: 1.15 mmol/L (ref 1.15–1.40)
Calcium, Ion: 1.05 mmol/L — ABNORMAL LOW (ref 1.15–1.40)
Calcium, Ion: 1.05 mmol/L — ABNORMAL LOW (ref 1.15–1.40)
Calcium, Ion: 1.25 mmol/L (ref 1.15–1.40)
Calcium, Ion: 1.15 mmol/L (ref 1.15–1.40)
Chloride: 99 mmol/L — ABNORMAL LOW (ref 101–111)
Creatinine, Ser: 0.9 mg/dL (ref 0.61–1.24)
Creatinine, Ser: 1 mg/dL (ref 0.61–1.24)
GLUCOSE: 140 mg/dL — AB (ref 65–99)
GLUCOSE: 125 mg/dL — AB (ref 65–99)
GLUCOSE: 218 mg/dL — AB (ref 65–99)
GLUCOSE: 244 mg/dL — AB (ref 65–99)
GLUCOSE: 191 mg/dL — AB (ref 65–99)
Glucose, Bld: 131 mg/dL — ABNORMAL HIGH (ref 65–99)
Glucose, Bld: 131 mg/dL — ABNORMAL HIGH (ref 65–99)
Glucose, Bld: 192 mg/dL — ABNORMAL HIGH (ref 65–99)
HCT: 34 % — ABNORMAL LOW (ref 39.0–52.0)
HCT: 35 % — ABNORMAL LOW (ref 39.0–52.0)
HCT: 38 % — ABNORMAL LOW (ref 39.0–52.0)
HCT: 33 % — ABNORMAL LOW (ref 39.0–52.0)
HEMATOCRIT: 44 % (ref 39.0–52.0)
HEMATOCRIT: 44 % (ref 39.0–52.0)
HEMATOCRIT: 37 % — AB (ref 39.0–52.0)
HEMATOCRIT: 33 % — AB (ref 39.0–52.0)
Hemoglobin: 15 g/dL (ref 13.0–17.0)
Hemoglobin: 15 g/dL (ref 13.0–17.0)
Hemoglobin: 11.6 g/dL — ABNORMAL LOW (ref 13.0–17.0)
Hemoglobin: 11.9 g/dL — ABNORMAL LOW (ref 13.0–17.0)
Hemoglobin: 12.6 g/dL — ABNORMAL LOW (ref 13.0–17.0)
Hemoglobin: 12.9 g/dL — ABNORMAL LOW (ref 13.0–17.0)
Hemoglobin: 11.2 g/dL — ABNORMAL LOW (ref 13.0–17.0)
Hemoglobin: 11.2 g/dL — ABNORMAL LOW (ref 13.0–17.0)
POTASSIUM: 4.2 mmol/L (ref 3.5–5.1)
POTASSIUM: 4.8 mmol/L (ref 3.5–5.1)
POTASSIUM: 4.1 mmol/L (ref 3.5–5.1)
POTASSIUM: 4.6 mmol/L (ref 3.5–5.1)
POTASSIUM: 4.6 mmol/L (ref 3.5–5.1)
POTASSIUM: 4.7 mmol/L (ref 3.5–5.1)
Potassium: 4.2 mmol/L (ref 3.5–5.1)
Potassium: 3.9 mmol/L (ref 3.5–5.1)
SODIUM: 135 mmol/L (ref 135–145)
Sodium: 140 mmol/L (ref 135–145)
Sodium: 139 mmol/L (ref 135–145)
Sodium: 138 mmol/L (ref 135–145)
Sodium: 138 mmol/L (ref 135–145)
Sodium: 135 mmol/L (ref 135–145)
Sodium: 138 mmol/L (ref 135–145)
Sodium: 138 mmol/L (ref 135–145)
TCO2: 29 mmol/L (ref 0–100)
TCO2: 29 mmol/L (ref 0–100)
TCO2: 27 mmol/L (ref 0–100)
TCO2: 28 mmol/L (ref 0–100)
TCO2: 25 mmol/L (ref 0–100)
TCO2: 25 mmol/L (ref 0–100)
TCO2: 26 mmol/L (ref 0–100)
TCO2: 21 mmol/L (ref 0–100)

## 2016-04-02 LAB — GLUCOSE, CAPILLARY
GLUCOSE-CAPILLARY: 100 mg/dL — AB (ref 65–99)
GLUCOSE-CAPILLARY: 167 mg/dL — AB (ref 65–99)
GLUCOSE-CAPILLARY: 173 mg/dL — AB (ref 65–99)
Glucose-Capillary: 136 mg/dL — ABNORMAL HIGH (ref 65–99)
Glucose-Capillary: 130 mg/dL — ABNORMAL HIGH (ref 65–99)
Glucose-Capillary: 137 mg/dL — ABNORMAL HIGH (ref 65–99)
Glucose-Capillary: 131 mg/dL — ABNORMAL HIGH (ref 65–99)
Glucose-Capillary: 194 mg/dL — ABNORMAL HIGH (ref 65–99)
Glucose-Capillary: 153 mg/dL — ABNORMAL HIGH (ref 65–99)

## 2016-04-02 LAB — CBC
HCT: 36.3 % — ABNORMAL LOW (ref 39.0–52.0)
HEMATOCRIT: 48 % (ref 39.0–52.0)
HEMATOCRIT: 34.6 % — AB (ref 39.0–52.0)
Hemoglobin: 15.3 g/dL (ref 13.0–17.0)
Hemoglobin: 10.7 g/dL — ABNORMAL LOW (ref 13.0–17.0)
Hemoglobin: 11.3 g/dL — ABNORMAL LOW (ref 13.0–17.0)
MCH: 29.2 pg (ref 26.0–34.0)
MCH: 28.6 pg (ref 26.0–34.0)
MCH: 28.4 pg (ref 26.0–34.0)
MCHC: 31.9 g/dL (ref 30.0–36.0)
MCHC: 30.9 g/dL (ref 30.0–36.0)
MCHC: 31.1 g/dL (ref 30.0–36.0)
MCV: 91.6 fL (ref 78.0–100.0)
MCV: 92.5 fL (ref 78.0–100.0)
MCV: 91.2 fL (ref 78.0–100.0)
PLATELETS: 148 10*3/uL — AB (ref 150–400)
Platelets: 228 10*3/uL (ref 150–400)
Platelets: 162 10*3/uL (ref 150–400)
RBC: 5.24 MIL/uL (ref 4.22–5.81)
RBC: 3.74 MIL/uL — ABNORMAL LOW (ref 4.22–5.81)
RBC: 3.98 MIL/uL — ABNORMAL LOW (ref 4.22–5.81)
RDW: 15.2 % (ref 11.5–15.5)
RDW: 15 % (ref 11.5–15.5)
RDW: 14.9 % (ref 11.5–15.5)
WBC: 13.5 10*3/uL — AB (ref 4.0–10.5)
WBC: 25.3 10*3/uL — AB (ref 4.0–10.5)
WBC: 18.3 10*3/uL — ABNORMAL HIGH (ref 4.0–10.5)

## 2016-04-02 LAB — HEMOGLOBIN AND HEMATOCRIT, BLOOD
HCT: 38.8 % — ABNORMAL LOW (ref 39.0–52.0)
Hemoglobin: 12.3 g/dL — ABNORMAL LOW (ref 13.0–17.0)

## 2016-04-02 LAB — PROTIME-INR
INR: 1.51
Prothrombin Time: 18.3 seconds — ABNORMAL HIGH (ref 11.4–15.2)

## 2016-04-02 LAB — CREATININE, SERUM
Creatinine, Ser: 1.02 mg/dL (ref 0.61–1.24)
GFR calc Af Amer: >60 mL/min (ref >60)
GFR calc non Af Amer: >60 mL/min (ref >60)

## 2016-04-02 LAB — ECHO TEE
PISA EROA: 0.08 cm2
VTI: 169 cm

## 2016-04-02 LAB — MAGNESIUM: Magnesium: 2.7 mg/dL — ABNORMAL HIGH (ref 1.7–2.4)

## 2016-04-02 LAB — HEPARIN LEVEL (UNFRACTIONATED): Heparin Unfractionated: 0.5 IU/mL (ref 0.30–0.70)

## 2016-04-02 LAB — PLATELET COUNT: Platelets: 189 10*3/uL (ref 150–400)

## 2016-04-02 LAB — APTT: APTT: 32 s (ref 24–36)

## 2016-04-02 SURGERY — CORONARY ARTERY BYPASS GRAFTING (CABG)
Anesthesia: General | Site: Groin

## 2016-04-02 MED ORDER — POTASSIUM CHLORIDE 10 MEQ/50ML IV SOLN: 10.0000 meq | INTRAVENOUS | Status: AC

## 2016-04-02 MED ORDER — CHLORHEXIDINE GLUCONATE 0.12% ORAL RINSE (MEDLINE KIT)
15.0000 mL | Freq: Two times a day (BID) | OROMUCOSAL | Status: DC
  Administered 2016-04-02 – 2016-04-04 (×4): 15 mL via OROMUCOSAL

## 2016-04-02 MED ORDER — CALCIUM CHLORIDE 10 % IV SOLN
INTRAVENOUS | Status: DC | PRN
  Administered 2016-04-02: 200 mg via INTRAVENOUS
  Administered 2016-04-02: 100 mg via INTRAVENOUS
  Administered 2016-04-02: 200 mg via INTRAVENOUS
  Administered 2016-04-02: 500 mg via INTRAVENOUS

## 2016-04-02 MED ORDER — MILRINONE LACTATE IN DEXTROSE 20-5 MG/100ML-% IV SOLN
0.1000 ug/kg/min | INTRAVENOUS | Status: DC
  Administered 2016-04-02 (×2): 0.1 ug/kg/min via INTRAVENOUS
  Filled 2016-04-02 (×2): qty 100

## 2016-04-02 MED ORDER — ONDANSETRON HCL 4 MG/2ML IJ SOLN: 4.0000 mg | Freq: Four times a day (QID) | INTRAMUSCULAR | Status: DC | PRN

## 2016-04-02 MED ORDER — OXYCODONE HCL 5 MG PO TABS
5.0000 mg | ORAL_TABLET | ORAL | Status: DC | PRN
  Administered 2016-04-03 – 2016-04-05 (×7): 10 mg via ORAL
  Administered 2016-04-05: 5 mg via ORAL
  Administered 2016-04-06 (×2): 10 mg via ORAL
  Administered 2016-04-06 – 2016-04-07 (×4): 5 mg via ORAL
  Administered 2016-04-08: 10 mg via ORAL
  Filled 2016-04-02: qty 1
  Filled 2016-04-02 (×2): qty 2
  Filled 2016-04-02 (×2): qty 1
  Filled 2016-04-02: qty 2
  Filled 2016-04-02: qty 1
  Filled 2016-04-02 (×5): qty 2
  Filled 2016-04-02: qty 1
  Filled 2016-04-02 (×2): qty 2

## 2016-04-02 MED ORDER — SODIUM CHLORIDE 0.9 % IJ SOLN
OROMUCOSAL | Status: DC | PRN
  Administered 2016-04-02: 12 mL via TOPICAL

## 2016-04-02 MED ORDER — ROCURONIUM BROMIDE 10 MG/ML (PF) SYRINGE
PREFILLED_SYRINGE | INTRAVENOUS | Status: AC
Start: 2016-04-02 — End: 2016-04-02
  Filled 2016-04-02: qty 20

## 2016-04-02 MED ORDER — ARTIFICIAL TEARS OP OINT
TOPICAL_OINTMENT | OPHTHALMIC | Status: AC
  Filled 2016-04-02: qty 3.5

## 2016-04-02 MED ORDER — PROTAMINE SULFATE 10 MG/ML IV SOLN
INTRAVENOUS | Status: DC | PRN
  Administered 2016-04-02: 300 mg via INTRAVENOUS

## 2016-04-02 MED ORDER — ASPIRIN EC 325 MG PO TBEC
325.0000 mg | DELAYED_RELEASE_TABLET | Freq: Every day | ORAL | Status: DC
  Administered 2016-04-03 – 2016-04-11 (×8): 325 mg via ORAL
  Filled 2016-04-02 (×8): qty 1

## 2016-04-02 MED ORDER — MIDAZOLAM HCL 2 MG/2ML IJ SOLN
INTRAMUSCULAR | Status: AC
  Filled 2016-04-02: qty 2

## 2016-04-02 MED ORDER — FENTANYL CITRATE (PF) 250 MCG/5ML IJ SOLN
INTRAMUSCULAR | Status: AC
  Filled 2016-04-02: qty 20

## 2016-04-02 MED ORDER — NOREPINEPHRINE BITARTRATE 1 MG/ML IV SOLN
INTRAVENOUS | Status: DC | PRN
  Administered 2016-04-02: 2 mL via INTRAVENOUS
  Administered 2016-04-02 (×2): 1 mL via INTRAVENOUS

## 2016-04-02 MED ORDER — DEXMEDETOMIDINE HCL IN NACL 200 MCG/50ML IV SOLN
0.0000 ug/kg/h | INTRAVENOUS | Status: DC
  Administered 2016-04-02 – 2016-04-03 (×2): 0.3 ug/kg/h via INTRAVENOUS
  Filled 2016-04-02 (×4): qty 50

## 2016-04-02 MED ORDER — MILRINONE LACTATE IN DEXTROSE 20-5 MG/100ML-% IV SOLN
0.1250 ug/kg/min | INTRAVENOUS | Status: AC
Start: 2016-04-02 — End: 2016-04-02
  Administered 2016-04-02: .25 ug/kg/min via INTRAVENOUS
  Filled 2016-04-02: qty 100

## 2016-04-02 MED ORDER — ROCURONIUM BROMIDE 10 MG/ML (PF) SYRINGE
PREFILLED_SYRINGE | INTRAVENOUS | Status: DC | PRN
  Administered 2016-04-02 (×2): 50 mg via INTRAVENOUS
  Administered 2016-04-02: 100 mg via INTRAVENOUS
  Administered 2016-04-02 (×2): 50 mg via INTRAVENOUS

## 2016-04-02 MED ORDER — TRAMADOL HCL 50 MG PO TABS
50.0000 mg | ORAL_TABLET | ORAL | Status: DC | PRN
  Administered 2016-04-04 – 2016-04-10 (×8): 100 mg via ORAL
  Filled 2016-04-02 (×8): qty 2

## 2016-04-02 MED ORDER — SODIUM CHLORIDE 0.9 % IV SOLN
250.0000 mL | INTRAVENOUS | Status: DC
  Administered 2016-04-03: 250 mL via INTRAVENOUS

## 2016-04-02 MED ORDER — LIDOCAINE 2% (20 MG/ML) 5 ML SYRINGE
INTRAMUSCULAR | Status: AC
  Filled 2016-04-02: qty 5

## 2016-04-02 MED ORDER — SODIUM CHLORIDE 0.9 % IV SOLN
INTRAVENOUS | Status: DC | PRN
  Administered 2016-04-02: 07:00:00 via INTRAVENOUS

## 2016-04-02 MED ORDER — PHENYLEPHRINE HCL 10 MG/ML IJ SOLN
INTRAVENOUS | Status: DC | PRN
  Administered 2016-04-02: 10 ug/min via INTRAVENOUS

## 2016-04-02 MED ORDER — PROPOFOL 10 MG/ML IV BOLUS
INTRAVENOUS | Status: AC
  Filled 2016-04-02: qty 20

## 2016-04-02 MED ORDER — ALBUMIN HUMAN 5 % IV SOLN
INTRAVENOUS | Status: DC | PRN
  Administered 2016-04-02 (×4): via INTRAVENOUS

## 2016-04-02 MED ORDER — FENTANYL CITRATE (PF) 250 MCG/5ML IJ SOLN
INTRAMUSCULAR | Status: DC | PRN
  Administered 2016-04-02: 50 ug via INTRAVENOUS
  Administered 2016-04-02: 100 ug via INTRAVENOUS
  Administered 2016-04-02: 50 ug via INTRAVENOUS
  Administered 2016-04-02: 100 ug via INTRAVENOUS
  Administered 2016-04-02: 500 ug via INTRAVENOUS
  Administered 2016-04-02: 100 ug via INTRAVENOUS
  Administered 2016-04-02: 50 ug via INTRAVENOUS

## 2016-04-02 MED ORDER — PAPAVERINE HCL 30 MG/ML IJ SOLN
INTRAMUSCULAR | Status: DC | PRN
  Administered 2016-04-02: 500 mL

## 2016-04-02 MED ORDER — DEXTROSE 5 % IV SOLN
1.5000 g | Freq: Two times a day (BID) | INTRAVENOUS | Status: AC
  Administered 2016-04-02 – 2016-04-04 (×4): 1.5 g via INTRAVENOUS
  Filled 2016-04-02 (×6): qty 1.5

## 2016-04-02 MED ORDER — DOPAMINE-DEXTROSE 3.2-5 MG/ML-% IV SOLN
3.0000 ug/kg/min | INTRAVENOUS | Status: DC
  Administered 2016-04-03 (×2): 3 ug/kg/min via INTRAVENOUS
  Filled 2016-04-02: qty 250

## 2016-04-02 MED ORDER — PROPOFOL 10 MG/ML IV BOLUS
INTRAVENOUS | Status: DC | PRN
  Administered 2016-04-02: 50 mg via INTRAVENOUS

## 2016-04-02 MED ORDER — PHENYLEPHRINE HCL 10 MG/ML IJ SOLN
INTRAMUSCULAR | Status: AC
  Filled 2016-04-02: qty 3

## 2016-04-02 MED ORDER — EPHEDRINE 5 MG/ML INJ
INTRAVENOUS | Status: AC
  Filled 2016-04-02: qty 10

## 2016-04-02 MED ORDER — MAGNESIUM SULFATE 4 GM/100ML IV SOLN
4.0000 g | Freq: Once | INTRAVENOUS | Status: AC
  Administered 2016-04-02: 4 g via INTRAVENOUS
  Filled 2016-04-02: qty 100

## 2016-04-02 MED ORDER — METOPROLOL TARTRATE 5 MG/5ML IV SOLN
2.5000 mg | INTRAVENOUS | Status: DC | PRN
  Administered 2016-04-05: 5 mg via INTRAVENOUS
  Administered 2016-04-06: 2.5 mg via INTRAVENOUS
  Filled 2016-04-02 (×2): qty 5

## 2016-04-02 MED ORDER — PHENYLEPHRINE 40 MCG/ML (10ML) SYRINGE FOR IV PUSH (FOR BLOOD PRESSURE SUPPORT)
PREFILLED_SYRINGE | INTRAVENOUS | Status: AC
  Filled 2016-04-02: qty 10

## 2016-04-02 MED ORDER — 0.9 % SODIUM CHLORIDE (POUR BTL) OPTIME
TOPICAL | Status: DC | PRN
  Administered 2016-04-02: 5000 mL

## 2016-04-02 MED ORDER — CHLORHEXIDINE GLUCONATE 0.12 % MT SOLN
15.0000 mL | OROMUCOSAL | Status: AC
  Administered 2016-04-02: 15 mL via OROMUCOSAL

## 2016-04-02 MED ORDER — LEVALBUTEROL HCL 0.63 MG/3ML IN NEBU
0.6300 mg | INHALATION_SOLUTION | Freq: Four times a day (QID) | RESPIRATORY_TRACT | Status: DC
  Administered 2016-04-02 – 2016-04-04 (×8): 0.63 mg via RESPIRATORY_TRACT
  Filled 2016-04-02 (×8): qty 3

## 2016-04-02 MED ORDER — LEVALBUTEROL TARTRATE 45 MCG/ACT IN AERO
2.0000 | INHALATION_SPRAY | RESPIRATORY_TRACT | Status: AC
  Administered 2016-04-02: 3 via RESPIRATORY_TRACT
  Administered 2016-04-02: 2 via RESPIRATORY_TRACT
  Filled 2016-04-02: qty 15

## 2016-04-02 MED ORDER — ACETAMINOPHEN 160 MG/5ML PO SOLN: 650.0000 mg | Freq: Once | ORAL | Status: AC

## 2016-04-02 MED ORDER — SODIUM CHLORIDE 0.9 % IV SOLN
INTRAVENOUS | Status: DC
  Administered 2016-04-02: 22:00:00 via INTRAVENOUS

## 2016-04-02 MED ORDER — NOREPINEPHRINE BITARTRATE 1 MG/ML IV SOLN
0.0000 ug/min | INTRAVENOUS | Status: AC
  Administered 2016-04-02: 2 ug/min via INTRAVENOUS
  Filled 2016-04-02: qty 4

## 2016-04-02 MED ORDER — SODIUM CHLORIDE 0.45 % IV SOLN
INTRAVENOUS | Status: DC | PRN
  Administered 2016-04-02: 15:00:00 via INTRAVENOUS

## 2016-04-02 MED ORDER — BISACODYL 10 MG RE SUPP: 10.0000 mg | Freq: Every day | RECTAL | Status: DC

## 2016-04-02 MED ORDER — MIDAZOLAM HCL 5 MG/5ML IJ SOLN
INTRAMUSCULAR | Status: DC | PRN
  Administered 2016-04-02: 1 mg via INTRAVENOUS
  Administered 2016-04-02 (×2): 2 mg via INTRAVENOUS
  Administered 2016-04-02: 1 mg via INTRAVENOUS
  Administered 2016-04-02 (×2): 2 mg via INTRAVENOUS

## 2016-04-02 MED ORDER — NITROGLYCERIN IN D5W 200-5 MCG/ML-% IV SOLN: 0.0000 ug/min | INTRAVENOUS | Status: DC

## 2016-04-02 MED ORDER — PHENYLEPHRINE HCL 10 MG/ML IJ SOLN
0.0000 ug/min | INTRAVENOUS | Status: DC
  Filled 2016-04-02: qty 2

## 2016-04-02 MED ORDER — ASPIRIN 81 MG PO CHEW
324.0000 mg | CHEWABLE_TABLET | Freq: Every day | ORAL | Status: DC
  Administered 2016-04-07: 324 mg
  Filled 2016-04-02: qty 4

## 2016-04-02 MED ORDER — MORPHINE SULFATE (PF) 2 MG/ML IV SOLN
2.0000 mg | INTRAVENOUS | Status: DC | PRN
  Administered 2016-04-03: 2 mg via INTRAVENOUS
  Administered 2016-04-03 (×2): 4 mg via INTRAVENOUS
  Administered 2016-04-04 (×4): 2 mg via INTRAVENOUS
  Filled 2016-04-02 (×2): qty 1
  Filled 2016-04-02: qty 2
  Filled 2016-04-02: qty 1
  Filled 2016-04-02 (×2): qty 2

## 2016-04-02 MED ORDER — PROTAMINE SULFATE 10 MG/ML IV SOLN
INTRAVENOUS | Status: AC
  Filled 2016-04-02: qty 50

## 2016-04-02 MED ORDER — HEMOSTATIC AGENTS (NO CHARGE) OPTIME
TOPICAL | Status: DC | PRN
  Administered 2016-04-02: 1 via TOPICAL

## 2016-04-02 MED ORDER — LACTATED RINGERS IV SOLN
INTRAVENOUS | Status: DC | PRN
  Administered 2016-04-02 (×2): via INTRAVENOUS

## 2016-04-02 MED ORDER — SUCCINYLCHOLINE CHLORIDE 200 MG/10ML IV SOSY
PREFILLED_SYRINGE | INTRAVENOUS | Status: AC
  Filled 2016-04-02: qty 10

## 2016-04-02 MED ORDER — FENTANYL CITRATE (PF) 250 MCG/5ML IJ SOLN
INTRAMUSCULAR | Status: AC
  Filled 2016-04-02: qty 5

## 2016-04-02 MED ORDER — SODIUM CHLORIDE 0.9 % IJ SOLN
INTRAMUSCULAR | Status: AC
  Filled 2016-04-02: qty 20

## 2016-04-02 MED ORDER — METOPROLOL TARTRATE 25 MG/10 ML ORAL SUSPENSION: 12.5000 mg | Freq: Two times a day (BID) | ORAL | Status: DC

## 2016-04-02 MED ORDER — PANTOPRAZOLE SODIUM 40 MG PO TBEC
40.0000 mg | DELAYED_RELEASE_TABLET | Freq: Every day | ORAL | Status: DC
  Administered 2016-04-04 – 2016-04-11 (×8): 40 mg via ORAL
  Filled 2016-04-02 (×9): qty 1

## 2016-04-02 MED ORDER — DOPAMINE-DEXTROSE 3.2-5 MG/ML-% IV SOLN
INTRAVENOUS | Status: DC | PRN
  Administered 2016-04-02: 3 ug/kg/min via INTRAVENOUS

## 2016-04-02 MED ORDER — HEPARIN SODIUM (PORCINE) 1000 UNIT/ML IJ SOLN
INTRAMUSCULAR | Status: DC | PRN
  Administered 2016-04-02: 34000 [IU] via INTRAVENOUS

## 2016-04-02 MED ORDER — SODIUM CHLORIDE 0.9 % IV SOLN
INTRAVENOUS | Status: DC
  Administered 2016-04-02: 18:00:00 via INTRAVENOUS
  Administered 2016-04-03: 6 [IU]/h via INTRAVENOUS
  Filled 2016-04-02 (×2): qty 2.5

## 2016-04-02 MED ORDER — ACETAMINOPHEN 160 MG/5ML PO SOLN
1000.0000 mg | Freq: Four times a day (QID) | ORAL | Status: AC
  Administered 2016-04-02 – 2016-04-03 (×2): 1000 mg
  Filled 2016-04-02 (×2): qty 40.6

## 2016-04-02 MED ORDER — LACTATED RINGERS IV SOLN: INTRAVENOUS | Status: DC

## 2016-04-02 MED ORDER — SUCCINYLCHOLINE 20MG/ML (10ML) SYRINGE FOR MEDFUSION PUMP - OPTIME
INTRAMUSCULAR | Status: DC | PRN
  Administered 2016-04-02: 140 mg via INTRAVENOUS

## 2016-04-02 MED ORDER — METOPROLOL TARTRATE 12.5 MG HALF TABLET
12.5000 mg | ORAL_TABLET | Freq: Two times a day (BID) | ORAL | Status: DC
  Administered 2016-04-03 – 2016-04-11 (×16): 12.5 mg via ORAL
  Filled 2016-04-02 (×16): qty 1

## 2016-04-02 MED ORDER — DOCUSATE SODIUM 100 MG PO CAPS
200.0000 mg | ORAL_CAPSULE | Freq: Every day | ORAL | Status: DC
  Administered 2016-04-03 – 2016-04-11 (×7): 200 mg via ORAL
  Filled 2016-04-02 (×7): qty 2

## 2016-04-02 MED ORDER — ORAL CARE MOUTH RINSE
15.0000 mL | Freq: Four times a day (QID) | OROMUCOSAL | Status: DC
  Administered 2016-04-02 – 2016-04-04 (×6): 15 mL via OROMUCOSAL

## 2016-04-02 MED ORDER — SODIUM CHLORIDE 0.9% FLUSH
3.0000 mL | Freq: Two times a day (BID) | INTRAVENOUS | Status: DC
  Administered 2016-04-03 – 2016-04-10 (×11): 3 mL via INTRAVENOUS

## 2016-04-02 MED ORDER — MORPHINE SULFATE (PF) 2 MG/ML IV SOLN
1.0000 mg | INTRAVENOUS | Status: AC | PRN
  Administered 2016-04-02 – 2016-04-03 (×2): 2 mg via INTRAVENOUS
  Filled 2016-04-02: qty 2
  Filled 2016-04-02: qty 1

## 2016-04-02 MED ORDER — INSULIN REGULAR BOLUS VIA INFUSION
0.0000 [IU] | Freq: Three times a day (TID) | INTRAVENOUS | Status: DC
  Administered 2016-04-03: 5 [IU] via INTRAVENOUS
  Filled 2016-04-02: qty 10

## 2016-04-02 MED ORDER — BISACODYL 5 MG PO TBEC
10.0000 mg | DELAYED_RELEASE_TABLET | Freq: Every day | ORAL | Status: DC
  Administered 2016-04-03 – 2016-04-09 (×7): 10 mg via ORAL
  Filled 2016-04-02 (×7): qty 2

## 2016-04-02 MED ORDER — FAMOTIDINE IN NACL 20-0.9 MG/50ML-% IV SOLN
20.0000 mg | Freq: Two times a day (BID) | INTRAVENOUS | Status: AC
  Administered 2016-04-02 – 2016-04-03 (×2): 20 mg via INTRAVENOUS
  Filled 2016-04-02: qty 50

## 2016-04-02 MED ORDER — HEPARIN SODIUM (PORCINE) 1000 UNIT/ML IJ SOLN
INTRAMUSCULAR | Status: AC
  Filled 2016-04-02: qty 1

## 2016-04-02 MED ORDER — SODIUM CHLORIDE 0.9 % IJ SOLN
INTRAMUSCULAR | Status: AC
  Filled 2016-04-02: qty 10

## 2016-04-02 MED ORDER — ALBUMIN HUMAN 5 % IV SOLN: 250.0000 mL | INTRAVENOUS | Status: AC | PRN

## 2016-04-02 MED ORDER — LACTATED RINGERS IV SOLN: 500.0000 mL | Freq: Once | INTRAVENOUS | Status: DC | PRN

## 2016-04-02 MED ORDER — MIDAZOLAM HCL 10 MG/2ML IJ SOLN
INTRAMUSCULAR | Status: AC
  Filled 2016-04-02: qty 2

## 2016-04-02 MED ORDER — DEXTROSE 5 % IV SOLN
INTRAVENOUS | Status: DC | PRN
  Administered 2016-04-02: 1.5 g via INTRAVENOUS
  Administered 2016-04-02: .75 g via INTRAVENOUS

## 2016-04-02 MED ORDER — VANCOMYCIN HCL IN DEXTROSE 1-5 GM/200ML-% IV SOLN
1000.0000 mg | Freq: Once | INTRAVENOUS | Status: AC
  Administered 2016-04-02: 1000 mg via INTRAVENOUS
  Filled 2016-04-02: qty 200

## 2016-04-02 MED ORDER — ACETAMINOPHEN 650 MG RE SUPP
650.0000 mg | Freq: Once | RECTAL | Status: AC
  Administered 2016-04-02: 650 mg via RECTAL

## 2016-04-02 MED ORDER — DEXTROSE 5 % IV SOLN: 0.0000 ug/min | INTRAVENOUS | Status: DC

## 2016-04-02 MED ORDER — DEXTROSE 5 % IV SOLN
0.0000 ug/min | INTRAVENOUS | Status: DC
  Administered 2016-04-02: 10 ug/min via INTRAVENOUS
  Filled 2016-04-02: qty 4

## 2016-04-02 MED ORDER — SODIUM CHLORIDE 0.9% FLUSH: 3.0000 mL | INTRAVENOUS | Status: DC | PRN

## 2016-04-02 MED ORDER — MIDAZOLAM HCL 2 MG/2ML IJ SOLN
2.0000 mg | INTRAMUSCULAR | Status: DC | PRN
  Administered 2016-04-02: 2 mg via INTRAVENOUS
  Filled 2016-04-02: qty 2

## 2016-04-02 MED ORDER — NOREPINEPHRINE BITARTRATE 1 MG/ML IV SOLN
0.0000 ug/min | INTRAVENOUS | Status: DC
  Administered 2016-04-02: 12 ug/min via INTRAVENOUS
  Filled 2016-04-02 (×2): qty 16

## 2016-04-02 MED ORDER — ACETAMINOPHEN 500 MG PO TABS
1000.0000 mg | ORAL_TABLET | Freq: Four times a day (QID) | ORAL | Status: AC
  Administered 2016-04-03 – 2016-04-07 (×17): 1000 mg via ORAL
  Filled 2016-04-02 (×13): qty 2

## 2016-04-02 MED ORDER — PROTAMINE SULFATE 10 MG/ML IV SOLN
INTRAVENOUS | Status: AC
  Filled 2016-04-02: qty 5

## 2016-04-02 MED ORDER — LEVALBUTEROL HCL 1.25 MG/0.5ML IN NEBU
1.2500 mg | INHALATION_SOLUTION | RESPIRATORY_TRACT | Status: DC
  Filled 2016-04-02 (×2): qty 0.5

## 2016-04-02 MED ORDER — LACTATED RINGERS IV SOLN
INTRAVENOUS | Status: DC
  Administered 2016-04-05: 11:00:00 via INTRAVENOUS

## 2016-04-02 MED ORDER — ROCURONIUM BROMIDE 10 MG/ML (PF) SYRINGE
PREFILLED_SYRINGE | INTRAVENOUS | Status: AC
  Filled 2016-04-02: qty 10

## 2016-04-02 SURGICAL SUPPLY — 77 items
ADH SKN CLS APL DERMABOND .7 (GAUZE/BANDAGES/DRESSINGS) ×4
ARTERIAL PRESSURE LINE (MISCELLANEOUS) ×2 IMPLANT
BAG DECANTER FOR FLEXI CONT (MISCELLANEOUS) ×5 IMPLANT
BANDAGE ACE 4X5 VEL STRL LF (GAUZE/BANDAGES/DRESSINGS) ×5 IMPLANT
BANDAGE ACE 6X5 VEL STRL LF (GAUZE/BANDAGES/DRESSINGS) ×5 IMPLANT
BIOPATCH RED 1 DISK 7.0 (GAUZE/BANDAGES/DRESSINGS) ×1 IMPLANT
BLADE STERNUM SYSTEM 6 (BLADE) ×5 IMPLANT
BNDG GAUZE ELAST 4 BULKY (GAUZE/BANDAGES/DRESSINGS) ×5 IMPLANT
CANISTER SUCTION 2500CC (MISCELLANEOUS) ×5 IMPLANT
CATH CPB KIT GERHARDT (MISCELLANEOUS) ×5 IMPLANT
CATH THORACIC 28FR (CATHETERS) ×5 IMPLANT
CRADLE DONUT ADULT HEAD (MISCELLANEOUS) ×5 IMPLANT
DERMABOND ADVANCED (GAUZE/BANDAGES/DRESSINGS) ×1
DERMABOND ADVANCED .7 DNX12 (GAUZE/BANDAGES/DRESSINGS) IMPLANT
DRAIN CHANNEL 28F RND 3/8 FF (WOUND CARE) ×5 IMPLANT
DRAPE CARDIOVASCULAR INCISE (DRAPES) ×5
DRAPE SLUSH/WARMER DISC (DRAPES) ×5 IMPLANT
DRAPE SRG 135X102X78XABS (DRAPES) ×4 IMPLANT
DRSG AQUACEL AG ADV 3.5X14 (GAUZE/BANDAGES/DRESSINGS) ×5 IMPLANT
DRSG TEGADERM 4X4.75 (GAUZE/BANDAGES/DRESSINGS) ×1 IMPLANT
ELECT BLADE 4.0 EZ CLEAN MEGAD (MISCELLANEOUS) ×5
ELECT REM PT RETURN 9FT ADLT (ELECTROSURGICAL) ×10
ELECTRODE BLDE 4.0 EZ CLN MEGD (MISCELLANEOUS) ×4 IMPLANT
ELECTRODE REM PT RTRN 9FT ADLT (ELECTROSURGICAL) ×8 IMPLANT
FELT TEFLON 1X6 (MISCELLANEOUS) ×10 IMPLANT
GAUZE SPONGE 4X4 12PLY STRL (GAUZE/BANDAGES/DRESSINGS) ×10 IMPLANT
GLOVE BIO SURGEON STRL SZ 6.5 (GLOVE) ×17 IMPLANT
GLOVE BIO SURGEON STRL SZ7 (GLOVE) ×1 IMPLANT
GLOVE BIO SURGEON STRL SZ7.5 (GLOVE) ×3 IMPLANT
GLOVE BIOGEL PI IND STRL 6 (GLOVE) IMPLANT
GLOVE BIOGEL PI INDICATOR 6 (GLOVE) ×2
GOWN STRL REUS W/ TWL LRG LVL3 (GOWN DISPOSABLE) ×16 IMPLANT
GOWN STRL REUS W/TWL LRG LVL3 (GOWN DISPOSABLE) ×25
HEMOSTAT POWDER SURGIFOAM 1G (HEMOSTASIS) ×15 IMPLANT
HEMOSTAT SURGICEL 2X14 (HEMOSTASIS) ×5 IMPLANT
KIT BASIN OR (CUSTOM PROCEDURE TRAY) ×5 IMPLANT
KIT CATH SUCT 8FR (CATHETERS) ×5 IMPLANT
KIT ROOM TURNOVER OR (KITS) ×5 IMPLANT
KIT SUCTION CATH 14FR (SUCTIONS) ×10 IMPLANT
KIT VASOVIEW HEMOPRO VH 3000 (KITS) ×5 IMPLANT
LEAD PACING MYOCARDI (MISCELLANEOUS) ×5 IMPLANT
MARKER GRAFT CORONARY BYPASS (MISCELLANEOUS) ×15 IMPLANT
NS IRRIG 1000ML POUR BTL (IV SOLUTION) ×25 IMPLANT
PACK OPEN HEART (CUSTOM PROCEDURE TRAY) ×5 IMPLANT
PAD ARMBOARD 7.5X6 YLW CONV (MISCELLANEOUS) ×10 IMPLANT
PAD ELECT DEFIB RADIOL ZOLL (MISCELLANEOUS) ×5 IMPLANT
PENCIL BUTTON HOLSTER BLD 10FT (ELECTRODE) ×5 IMPLANT
PUNCH AORTIC ROTATE  4.5MM 8IN (MISCELLANEOUS) ×1 IMPLANT
SET CARDIOPLEGIA MPS 5001102 (MISCELLANEOUS) ×1 IMPLANT
SPONGE GAUZE 4X4 12PLY STER LF (GAUZE/BANDAGES/DRESSINGS) ×2 IMPLANT
SPONGE LAP 18X18 X RAY DECT (DISPOSABLE) ×2 IMPLANT
STOPCOCK 4 WAY LG BORE MALE ST (IV SETS) ×2 IMPLANT
SURGIFLO W/THROMBIN 8M KIT (HEMOSTASIS) ×1 IMPLANT
SUT BONE WAX W31G (SUTURE) ×5 IMPLANT
SUT PROLENE 3 0 SH1 36 (SUTURE) ×6 IMPLANT
SUT PROLENE 4 0 TF (SUTURE) ×10 IMPLANT
SUT PROLENE 6 0 CC (SUTURE) ×13 IMPLANT
SUT PROLENE 7 0 BV1 MDA (SUTURE) ×6 IMPLANT
SUT PROLENE 8 0 BV175 6 (SUTURE) ×2 IMPLANT
SUT SILK 2 0 SH CR/8 (SUTURE) ×1 IMPLANT
SUT STEEL 6MS V (SUTURE) ×5 IMPLANT
SUT STEEL SZ 6 DBL 3X14 BALL (SUTURE) ×5 IMPLANT
SUT VIC AB 1 CT1 27 (SUTURE) ×5
SUT VIC AB 1 CT1 27XBRD ANTBC (SUTURE) IMPLANT
SUT VIC AB 1 CTX 18 (SUTURE) ×10 IMPLANT
SUT VIC AB 3-0 X1 27 (SUTURE) ×1 IMPLANT
SUTURE E-PAK OPEN HEART (SUTURE) ×5 IMPLANT
SYSTEM SAHARA CHEST DRAIN ATS (WOUND CARE) ×5 IMPLANT
TAPE CLOTH SURG 4X10 WHT LF (GAUZE/BANDAGES/DRESSINGS) ×2 IMPLANT
TAPE PAPER 3X10 WHT MICROPORE (GAUZE/BANDAGES/DRESSINGS) ×1 IMPLANT
TOWEL OR 17X24 6PK STRL BLUE (TOWEL DISPOSABLE) ×10 IMPLANT
TOWEL OR 17X26 10 PK STRL BLUE (TOWEL DISPOSABLE) ×10 IMPLANT
TRAY CATH LUMEN 1 20CM STRL (SET/KITS/TRAYS/PACK) ×1 IMPLANT
TRAY FOLEY IC TEMP SENS 16FR (CATHETERS) ×5 IMPLANT
TUBING INSUFFLATION (TUBING) ×5 IMPLANT
UNDERPAD 30X30 (UNDERPADS AND DIAPERS) ×5 IMPLANT
WATER STERILE IRR 1000ML POUR (IV SOLUTION) ×10 IMPLANT

## 2016-04-02 NOTE — Progress Notes (Addendum)
ABG on arrival: 7.20/50.4/68/20.2 Dr. Servando Snare called, new orders to change vent settings. RT made aware. Will repeat ABG in 30 min.  1630: Repeat ABG obtained: 7.30/47.3/108/23.7 Dr. Servando Snare made aware. No new orders. Will cont to monitor.  Per Dr. Servando Snare do not titrate dopamine or milrinone, ok to remove left femoral arterial line.

## 2016-04-02 NOTE — Progress Notes (Signed)
[facility] BRIEF SICU PROGRESS NOTE  Day of Surgery  S/P Procedure(s) (LRB): CORONARY ARTERY BYPASS GRAFTING (CABG) X 5 UTILIZING LEFT INTERNAL MAMMARY ARTERY AND ENDOSCOPICALLY HARVESTED GREATER SAPHENOUS VEIN ; Mammary to LAD, Sequencial 1st to 2nd Diagonal, Vein to OM, Vein to Distal RCA. (N/A) TRANSESOPHAGEAL ECHOCARDIOGRAM (TEE) (N/A) FEMORAL ARTERIAL LINE INSERTION (Left) INCIDENTAL LEFT MAMMARY LYMPH NODE BIOPSY (Left)   Sedated on vent AAI paced w/ stable hemodynamics on milrinone, levophed and dopamine drips O2 sats 98-100% Chest tube output low UOP excellent Labs okay  Plan: Continue routine early postop  Rexene Alberts, MD 04/02/2016 6:50 PM

## 2016-04-02 NOTE — Brief Op Note (Signed)
03/29/2016 - 04/02/2016  12:50 PM      [address]       ,[place] [address]             [phone]     03/29/2016 - 04/02/2016  12:50 PM  PATIENT:  David Brandt  70 y.o. male  PRE-OPERATIVE DIAGNOSIS:  CAD  POST-OPERATIVE DIAGNOSIS:  * No post-op diagnosis entered *  PROCEDURE:  Procedure(s): CORONARY ARTERY BYPASS GRAFTING (CABG) X 4 UTILIZING LEFT INTERNAL MAMMARY ARTERY AND ENDOSCOPICALLY HARVESTED GREATER SAPHENOUS VEIN(RIGHT THIGH EVH) TRANSESOPHAGEAL ECHOCARDIOGRAM (TEE) LEFT FEMORAL ARTERIAL LINE INSERTION  LIMA-LAD SEQ SVG-DIAG1-DIAG2 SVG-OM SVG-RCA  SURGEON:  Surgeon(s): [name], MD  PHYSICIAN ASSISTANT: Medard Decuir PA-C  ANESTHESIA:   general  PATIENT CONDITION:  ICU - intubated and hemodynamically stable.  PRE-OPERATIVE WEIGHT: [id number] kg  COMPLICATIONS: NO KNOWN

## 2016-04-02 NOTE — Progress Notes (Signed)
RT attempted wean on patient. Per ABG patients pO2 was low at 58, and patient got -15 on NIF. Patient switched back to rest mode and we will re-attempt at a later time.

## 2016-04-02 NOTE — Anesthesia Postprocedure Evaluation (Signed)
Anesthesia Post Note  Patient: David Brandt  Procedure(s) Performed: Procedure(s) (LRB): CORONARY ARTERY BYPASS GRAFTING (CABG) X 5 UTILIZING LEFT INTERNAL MAMMARY ARTERY AND ENDOSCOPICALLY HARVESTED GREATER SAPHENOUS VEIN ; Mammary to LAD, Sequencial 1st to 2nd Diagonal, Vein to OM, Vein to Distal RCA. (N/A) TRANSESOPHAGEAL ECHOCARDIOGRAM (TEE) (N/A) FEMORAL ARTERIAL LINE INSERTION (Left) INCIDENTAL LEFT MAMMARY LYMPH NODE BIOPSY (Left)  Patient location during evaluation: SICU Anesthesia Type: General Level of consciousness: sedated Pain management: pain level controlled Vital Signs Assessment: post-procedure vital signs reviewed and stable Respiratory status: patient remains intubated per anesthesia plan Cardiovascular status: stable Anesthetic complications: no    Last Vitals:  Vitals:   04/02/16 1600 04/02/16 1615  BP: 118/63   Pulse: 89 89  Resp: (!) 26 (!) 21  Temp: (!) 35.8 C (!) 35.7 C    Last Pain:  Vitals:   04/02/16 1511  TempSrc: Core (Comment)  PainSc:                  David Brandt

## 2016-04-02 NOTE — Anesthesia Procedure Notes (Addendum)
Central Venous Catheter Insertion Performed by: anesthesiologist 04/02/2016 6:45 AM Patient location: OR. Preanesthetic checklist: patient identified, IV checked, risks and benefits discussed, surgical consent, monitors and equipment checked, pre-op evaluation, timeout performed and anesthesia consent Position: Trendelenburg Lidocaine 1% used for infiltration Landmarks identified and Seldinger technique used Catheter size: 7.5 Fr Central line was placed.Sheath introducer Swan type and PA catheter depth:thermodilutionProcedure performed using ultrasound guided technique. Attempts: 2 Following insertion, line sutured and dressing applied. Post procedure assessment: blood return through all ports, free fluid flow and no air. Patient tolerated the procedure well with no immediate complications.

## 2016-04-02 NOTE — Progress Notes (Signed)
  Echocardiogram Echocardiogram Transesophageal has been performed.  David Brandt M 04/02/2016, 8:13 AM

## 2016-04-02 NOTE — Progress Notes (Signed)
Dr. Roxy Manns notified about second wean attempt. All parameters met except Vital .4. Verbal orders to put patient back on full support and prepare for wean in morning.

## 2016-04-02 NOTE — Transfer of Care (Signed)
Immediate Anesthesia Transfer of Care Note  Patient: David Brandt  Procedure(s) Performed: Procedure(s): CORONARY ARTERY BYPASS GRAFTING (CABG) X 5 UTILIZING LEFT INTERNAL MAMMARY ARTERY AND ENDOSCOPICALLY HARVESTED GREATER SAPHENOUS VEIN ; Mammary to LAD, Sequencial 1st to 2nd Diagonal, Vein to OM, Vein to Distal RCA. (N/A) TRANSESOPHAGEAL ECHOCARDIOGRAM (TEE) (N/A) FEMORAL ARTERIAL LINE INSERTION (Left) INCIDENTAL LEFT MAMMARY LYMPH NODE BIOPSY (Left)  Patient Location: SICU  Anesthesia Type:General  Level of Consciousness: sedated and Patient remains intubated per anesthesia plan  Airway & Oxygen Therapy: Patient remains intubated per anesthesia plan and Patient placed on Ventilator (see vital sign flow sheet for setting)  Post-op Assessment: Report given to RN and Post -op Vital signs reviewed and stable  Post vital signs: Reviewed and stable  Last Vitals:  Vitals:   04/02/16 0500 04/02/16 0510  BP:  124/60  Pulse:    Resp:  16  Temp: 36.6 C     Last Pain:  Vitals:   04/02/16 0500  TempSrc: Oral  PainSc:       Patients Stated Pain Goal: 0 ([id number])  Complications: No apparent anesthesia complications

## 2016-04-02 NOTE — Progress Notes (Signed)
Wean attempted x2. Patient failed parameters. RN contacted MD. Patient switched back to full support per MD.

## 2016-04-02 NOTE — Progress Notes (Signed)
Vent changes made per verbal order by Dr. Servando Snare based on ABG results.

## 2016-04-02 NOTE — Anesthesia Procedure Notes (Signed)
Procedures

## 2016-04-02 NOTE — Anesthesia Procedure Notes (Signed)
Procedure Name: Intubation Date/Time: 04/02/2016 7:47 AM Performed by: Kyung Rudd Pre-anesthesia Checklist: Patient identified, Emergency Drugs available, Suction available and Patient being monitored Patient Re-evaluated:Patient Re-evaluated prior to inductionOxygen Delivery Method: Circle system utilized Preoxygenation: Pre-oxygenation with 100% oxygen Intubation Type: IV induction Ventilation: Mask ventilation without difficulty, Two handed mask ventilation required and Oral airway inserted - appropriate to patient size Laryngoscope Size: Sabra Heck and 2 Grade View: Grade II Tube type: Oral Tube size: 8.0 mm Number of attempts: 1 Airway Equipment and Method: Stylet Placement Confirmation: ETT inserted through vocal cords under direct vision,  positive ETCO2 and breath sounds checked- equal and bilateral Secured at: 22 cm Tube secured with: Tape Dental Injury: Teeth and Oropharynx as per pre-operative assessment  Comments: AOI per [place], SRNA. Recommends using Glidescope for future intubations due to enlarged tongue.

## 2016-04-03 ENCOUNTER — Encounter: Payer: Self-pay | Admitting: *Deleted

## 2016-04-03 ENCOUNTER — Encounter (HOSPITAL_COMMUNITY): Payer: Self-pay | Admitting: Cardiothoracic Surgery

## 2016-04-03 ENCOUNTER — Inpatient Hospital Stay (HOSPITAL_COMMUNITY): Payer: Medicare PPO

## 2016-04-03 DIAGNOSIS — R9439 Abnormal result of other cardiovascular function study: Secondary | ICD-10-CM

## 2016-04-03 LAB — POCT I-STAT, CHEM 8
BUN: 12 mg/dL (ref 6–20)
CHLORIDE: 103 mmol/L (ref 101–111)
CREATININE: 0.9 mg/dL (ref 0.61–1.24)
Calcium, Ion: 1.17 mmol/L (ref 1.15–1.40)
GLUCOSE: 114 mg/dL — AB (ref 65–99)
HCT: 32 % — ABNORMAL LOW (ref 39.0–52.0)
Hemoglobin: 10.9 g/dL — ABNORMAL LOW (ref 13.0–17.0)
POTASSIUM: 3.9 mmol/L (ref 3.5–5.1)
Sodium: 139 mmol/L (ref 135–145)
TCO2: 25 mmol/L (ref 0–100)

## 2016-04-03 LAB — GLUCOSE, CAPILLARY
GLUCOSE-CAPILLARY: 116 mg/dL — AB (ref 65–99)
GLUCOSE-CAPILLARY: 123 mg/dL — AB (ref 65–99)
GLUCOSE-CAPILLARY: 123 mg/dL — AB (ref 65–99)
GLUCOSE-CAPILLARY: 123 mg/dL — AB (ref 65–99)
GLUCOSE-CAPILLARY: 125 mg/dL — AB (ref 65–99)
GLUCOSE-CAPILLARY: 127 mg/dL — AB (ref 65–99)
GLUCOSE-CAPILLARY: 138 mg/dL — AB (ref 65–99)
GLUCOSE-CAPILLARY: 142 mg/dL — AB (ref 65–99)
GLUCOSE-CAPILLARY: 146 mg/dL — AB (ref 65–99)
Glucose-Capillary: 106 mg/dL — ABNORMAL HIGH (ref 65–99)
Glucose-Capillary: 107 mg/dL — ABNORMAL HIGH (ref 65–99)
Glucose-Capillary: 113 mg/dL — ABNORMAL HIGH (ref 65–99)
Glucose-Capillary: 117 mg/dL — ABNORMAL HIGH (ref 65–99)
Glucose-Capillary: 122 mg/dL — ABNORMAL HIGH (ref 65–99)
Glucose-Capillary: 123 mg/dL — ABNORMAL HIGH (ref 65–99)
Glucose-Capillary: 126 mg/dL — ABNORMAL HIGH (ref 65–99)
Glucose-Capillary: 128 mg/dL — ABNORMAL HIGH (ref 65–99)
Glucose-Capillary: 138 mg/dL — ABNORMAL HIGH (ref 65–99)
Glucose-Capillary: 138 mg/dL — ABNORMAL HIGH (ref 65–99)
Glucose-Capillary: 140 mg/dL — ABNORMAL HIGH (ref 65–99)
Glucose-Capillary: 158 mg/dL — ABNORMAL HIGH (ref 65–99)
Glucose-Capillary: 95 mg/dL (ref 65–99)

## 2016-04-03 LAB — POCT I-STAT 3, ART BLOOD GAS (G3+)
ACID-BASE EXCESS: 2 mmol/L (ref 0.0–2.0)
BICARBONATE: 23.4 mmol/L (ref 20.0–28.0)
BICARBONATE: 24.1 mmol/L (ref 20.0–28.0)
Bicarbonate: 25.3 mmol/L (ref 20.0–28.0)
O2 SAT: 94 %
O2 SAT: 93 %
O2 Saturation: 90 %
PCO2 ART: 37.4 mmHg (ref 32.0–48.0)
PCO2 ART: 35.3 mmHg (ref 32.0–48.0)
PH ART: 7.442 (ref 7.350–7.450)
PH ART: 7.442 (ref 7.350–7.450)
PO2 ART: 66 mmHg — AB (ref 83.0–108.0)
PO2 ART: 58 mmHg — AB (ref 83.0–108.0)
TCO2: 26 mmol/L (ref 0–100)
TCO2: 24 mmol/L (ref 0–100)
TCO2: 25 mmol/L (ref 0–100)
pCO2 arterial: 35.5 mmHg (ref 32.0–48.0)
pH, Arterial: 7.432 (ref 7.350–7.450)
pO2, Arterial: 72 mmHg — ABNORMAL LOW (ref 83.0–108.0)

## 2016-04-03 LAB — CBC
HCT: 35 % — ABNORMAL LOW (ref 39.0–52.0)
HCT: 31.9 % — ABNORMAL LOW (ref 39.0–52.0)
HEMOGLOBIN: 10.3 g/dL — AB (ref 13.0–17.0)
Hemoglobin: 10.9 g/dL — ABNORMAL LOW (ref 13.0–17.0)
MCH: 28.4 pg (ref 26.0–34.0)
MCH: 28.7 pg (ref 26.0–34.0)
MCHC: 31.1 g/dL (ref 30.0–36.0)
MCHC: 32.3 g/dL (ref 30.0–36.0)
MCV: 91.1 fL (ref 78.0–100.0)
MCV: 88.9 fL (ref 78.0–100.0)
PLATELETS: 165 10*3/uL (ref 150–400)
Platelets: 157 10*3/uL (ref 150–400)
RBC: 3.84 MIL/uL — AB (ref 4.22–5.81)
RBC: 3.59 MIL/uL — AB (ref 4.22–5.81)
RDW: 15.1 % (ref 11.5–15.5)
RDW: 15.3 % (ref 11.5–15.5)
WBC: 19.3 10*3/uL — AB (ref 4.0–10.5)
WBC: 18.5 10*3/uL — ABNORMAL HIGH (ref 4.0–10.5)

## 2016-04-03 LAB — BASIC METABOLIC PANEL
ANION GAP: 9 (ref 5–15)
BUN: 13 mg/dL (ref 6–20)
CALCIUM: 7.9 mg/dL — AB (ref 8.9–10.3)
CO2: 21 mmol/L — ABNORMAL LOW (ref 22–32)
Chloride: 107 mmol/L (ref 101–111)
Creatinine, Ser: 1.08 mg/dL (ref 0.61–1.24)
GFR calc Af Amer: >60 mL/min (ref >60)
Glucose, Bld: 124 mg/dL — ABNORMAL HIGH (ref 65–99)
POTASSIUM: 4.6 mmol/L (ref 3.5–5.1)
SODIUM: 137 mmol/L (ref 135–145)

## 2016-04-03 LAB — CREATININE, SERUM
CREATININE: 0.9 mg/dL (ref 0.61–1.24)
GFR calc Af Amer: >60 mL/min (ref >60)

## 2016-04-03 LAB — CARBOXYHEMOGLOBIN
Carboxyhemoglobin: 1.5 % (ref 0.5–1.5)
Methemoglobin: 0.6 % (ref 0.0–1.5)
O2 Saturation: 63.7 %
Total hemoglobin: 11 g/dL — ABNORMAL LOW (ref 12.0–16.0)

## 2016-04-03 LAB — MAGNESIUM
MAGNESIUM: 2.5 mg/dL — AB (ref 1.7–2.4)
MAGNESIUM: 2.3 mg/dL (ref 1.7–2.4)

## 2016-04-03 MED ORDER — INSULIN ASPART 100 UNIT/ML ~~LOC~~ SOLN
0.0000 [IU] | SUBCUTANEOUS | Status: DC
  Administered 2016-04-04 – 2016-04-06 (×11): 2 [IU] via SUBCUTANEOUS

## 2016-04-03 MED ORDER — FUROSEMIDE 10 MG/ML IJ SOLN
40.0000 mg | Freq: Once | INTRAMUSCULAR | Status: AC
  Administered 2016-04-03: 40 mg via INTRAVENOUS
  Filled 2016-04-03: qty 4

## 2016-04-03 MED ORDER — INSULIN DETEMIR 100 UNIT/ML ~~LOC~~ SOLN: 25.0000 [IU] | Freq: Every day | SUBCUTANEOUS | Status: DC

## 2016-04-03 MED ORDER — INSULIN DETEMIR 100 UNIT/ML ~~LOC~~ SOLN
25.0000 [IU] | Freq: Every day | SUBCUTANEOUS | Status: AC
  Administered 2016-04-03: 25 [IU] via SUBCUTANEOUS
  Filled 2016-04-03: qty 0.25

## 2016-04-03 MED ORDER — INSULIN DETEMIR 100 UNIT/ML ~~LOC~~ SOLN
25.0000 [IU] | Freq: Every day | SUBCUTANEOUS | Status: DC
  Administered 2016-04-04 – 2016-04-07 (×4): 25 [IU] via SUBCUTANEOUS
  Filled 2016-04-03 (×5): qty 0.25

## 2016-04-03 MED ORDER — ENOXAPARIN SODIUM 30 MG/0.3ML ~~LOC~~ SOLN
30.0000 mg | Freq: Every day | SUBCUTANEOUS | Status: DC
  Administered 2016-04-03: 30 mg via SUBCUTANEOUS
  Filled 2016-04-03: qty 0.3

## 2016-04-03 MED ORDER — LEVALBUTEROL HCL 0.63 MG/3ML IN NEBU
0.6300 mg | INHALATION_SOLUTION | RESPIRATORY_TRACT | Status: DC | PRN
  Administered 2016-04-03 – 2016-04-04 (×3): 0.63 mg via RESPIRATORY_TRACT
  Filled 2016-04-03 (×3): qty 3

## 2016-04-03 MED FILL — Heparin Sodium (Porcine) Inj 1000 Unit/ML: INTRAMUSCULAR | Qty: 10 | Status: AC

## 2016-04-03 MED FILL — Potassium Chloride Inj 2 mEq/ML: INTRAVENOUS | Qty: 40 | Status: AC

## 2016-04-03 MED FILL — Magnesium Sulfate Inj 50%: INTRAMUSCULAR | Qty: 10 | Status: AC

## 2016-04-03 MED FILL — Lidocaine HCl IV Inj 20 MG/ML: INTRAVENOUS | Qty: 5 | Status: AC

## 2016-04-03 MED FILL — Sodium Chloride IV Soln 0.9%: INTRAVENOUS | Qty: 2000 | Status: AC

## 2016-04-03 MED FILL — Electrolyte-R (PH 7.4) Solution: INTRAVENOUS | Qty: 5000 | Status: AC

## 2016-04-03 MED FILL — Mannitol IV Soln 20%: INTRAVENOUS | Qty: 500 | Status: AC

## 2016-04-03 MED FILL — Sodium Bicarbonate IV Soln 8.4%: INTRAVENOUS | Qty: 50 | Status: AC

## 2016-04-03 MED FILL — Heparin Sodium (Porcine) Inj 1000 Unit/ML: INTRAMUSCULAR | Qty: 30 | Status: AC

## 2016-04-03 NOTE — Progress Notes (Signed)
CT surgery p.m. Rounds  Patient extubated earlier today, maintaining good O2 sats ration Cardiac index greater than 3 on low-dose milrinone, norepinephrine weaned off Excellent diuresis P.m. Labs-potassium 3.9 hemoglobin 10.3 bicarbonate 25

## 2016-04-03 NOTE — Care Management Important Message (Signed)
Important Message  Patient Details  Name: David Brandt MRN: [id number] Date of Birth: 1945-12-05   Medicare Important Message Given:  Yes    Taleshia Luff Abena 04/03/2016, 9:02 AM

## 2016-04-03 NOTE — Progress Notes (Signed)
Patient ID: David Brandt, male   DOB: 04-20-1946, 70 y.o.   MRN: [id number] T[facility] DAILY ICU PROGRESS NOTE                   [address]            [place] [address]          [phone]   1 Day Post-Op Procedure(s) (LRB): CORONARY ARTERY BYPASS GRAFTING (CABG) X 5 UTILIZING LEFT INTERNAL MAMMARY ARTERY AND ENDOSCOPICALLY HARVESTED GREATER SAPHENOUS VEIN ; Mammary to LAD, Sequencial 1st to 2nd Diagonal, Vein to OM, Vein to Distal RCA. (N/A) TRANSESOPHAGEAL ECHOCARDIOGRAM (TEE) (N/A) FEMORAL ARTERIAL LINE INSERTION (Left) INCIDENTAL LEFT MAMMARY LYMPH NODE BIOPSY (Left)  Total Length of Stay:  LOS: 5 days   Subjective: Extubated this am, awake and alert neuro intact  Objective: Vital signs in last 24 hours: Temp:  [96.3 F (35.7 C)-100.8 F (38.2 C)] 99.7 F (37.6 C) (10/10 0900) Pulse Rate:  [88-98] 94 (10/10 0900) Cardiac Rhythm: Atrial paced (10/10 0800) Resp:  [12-29] 25 (10/10 0900) BP: (99-141)/(51-68) 131/59 (10/10 0900) SpO2:  [92 %-100 %] 95 % (10/10 0900) Arterial Line BP: (88-126)/(52-73) 111/58 (10/09 1945) FiO2 (%):  [40 %-80 %] 40 % (10/10 0800) Weight:  [307 lb 5.1 oz (139.4 kg)] 307 lb 5.1 oz (139.4 kg) (10/10 0500)  Filed Weights   04/01/16 0440 04/02/16 0510 04/03/16 0500  Weight: 287 lb 8 oz (130.4 kg) 286 lb 11.2 oz (130 kg) (!) 307 lb 5.1 oz (139.4 kg)    Weight change: 20 lb 9.9 oz (9.354 kg)   Hemodynamic parameters for last 24 hours: PAP: (26-46)/(13-31) 31/19 CO:  [6.4 L/min-9.2 L/min] 7.5 L/min CI:  [2.6 L/min/m2-3.8 L/min/m2] 3.1 L/min/m2  Intake/Output from previous day: 10/09 0701 - 10/10 0700 In: 6548.9 [I.V.:4448.9; Blood:540; NG/GT:60; IV Piggyback:1500] Out: 3976 [BHALP:3790; Blood:1450; Chest Tube:406]  Intake/Output this shift: Total I/O In: 207.7 [I.V.:177.7; NG/GT:30] Out: 1225 [Urine:1085; Chest Tube:140]  Current Meds: Scheduled Meds: . acetaminophen  1,000 mg Oral Q6H   Or  . acetaminophen  (TYLENOL) oral liquid 160 mg/5 mL  1,000 mg Per Tube Q6H  . allopurinol  150 mg Oral Daily  . aspirin EC  325 mg Oral Daily   Or  . aspirin  324 mg Per Tube Daily  . atorvastatin  80 mg Oral QPM  . bisacodyl  10 mg Oral Daily   Or  . bisacodyl  10 mg Rectal Daily  . budesonide (PULMICORT) nebulizer solution  0.5 mg Nebulization BID  . cefUROXime (ZINACEF)  IV  1.5 g Intravenous Q12H  . chlorhexidine gluconate (MEDLINE KIT)  15 mL Mouth Rinse BID  . docusate sodium  200 mg Oral Daily  . insulin regular  0-10 Units Intravenous TID WC  . levalbuterol  0.63 mg Nebulization Q6H  . mouth rinse  15 mL Mouth Rinse QID  . metoprolol tartrate  12.5 mg Oral BID   Or  . metoprolol tartrate  12.5 mg Per Tube BID  . [START ON 04/04/2016] pantoprazole  40 mg Oral Daily  . sodium chloride flush  3 mL Intravenous Q12H   Continuous Infusions: . sodium chloride 10 mL/hr at 04/03/16 1000  . sodium chloride 250 mL (04/03/16 0515)  . sodium chloride Stopped (04/03/16 0800)  . dexmedetomidine Stopped (04/03/16 0615)  . DOPamine 3 mcg/kg/min (04/03/16 1000)  . insulin (NOVOLIN-R) infusion 9.4 Units/hr (04/03/16 1100)  . lactated ringers 10 mL/hr at 04/03/16 1000  .  lactated ringers Stopped (04/02/16 1515)  . milrinone 0.1 mcg/kg/min (04/03/16 1000)  . nitroGLYCERIN Stopped (04/02/16 1507)  . norepinephrine (LEVOPHED) Adult infusion 10 mcg/min (04/03/16 1100)  . phenylephrine (NEO-SYNEPHRINE) Adult infusion Stopped (04/02/16 1507)   PRN Meds:.sodium chloride, albumin human, lactated ringers, metoprolol, midazolam, morphine injection, ondansetron (ZOFRAN) IV, oxyCODONE, sodium chloride flush, traMADol  General appearance: alert and cooperative Neurologic: intact Heart: regular rate and rhythm, S1, S2 normal, no murmur, click, rub or gallop Lungs: diminished breath sounds bibasilar Abdomen: soft, non-tender; bowel sounds normal; no masses,  no organomegaly Extremities: extremities normal, atraumatic,  no cyanosis or edema and Homans sign is negative, no sign of DVT Wound: sternum stable  Lab Results: CBC: Recent Labs  04/02/16 2107 04/02/16 2115 04/03/16 0400  WBC 18.3*  --  19.3*  HGB 11.3* 11.2* 10.9*  HCT 36.3* 33.0* 35.0*  PLT 162  --  165   BMET:  Recent Labs  04/02/16 0506  04/02/16 2115 04/03/16 0400  NA 137  < > 138 137  K 3.9  < > 4.7 4.6  CL 103  < > 104 107  CO2 24  --   --  21*  GLUCOSE 132*  < > 191* 124*  BUN 16  < > 15 13  CREATININE 1.13  < > 0.90 1.08  CALCIUM 9.0  --   --  7.9*  < > = values in this interval not displayed.  CMET: Lab Results  Component Value Date   WBC 19.3 (H) 04/03/2016   HGB 10.9 (L) 04/03/2016   HCT 35.0 (L) 04/03/2016   PLT 165 04/03/2016   GLUCOSE 124 (H) 04/03/2016   CHOL 126 03/30/2016   TRIG 296 (H) 03/30/2016   HDL 31 (L) 03/30/2016   LDLCALC 36 03/30/2016   ALT 28 04/01/2016   AST 29 04/01/2016   NA 137 04/03/2016   K 4.6 04/03/2016   CL 107 04/03/2016   CREATININE 1.08 04/03/2016   BUN 13 04/03/2016   CO2 21 (L) 04/03/2016   TSH 0.928 03/23/2010   INR 1.51 04/02/2016   HGBA1C 6.3 (H) 03/31/2016    PT/INR:  Recent Labs  04/02/16 1528  LABPROT 18.3*  INR 1.51   Radiology: Dg Chest Port 1 View  Result Date: 04/03/2016 CLINICAL DATA:  Status post CABG yesterday. EXAM: PORTABLE CHEST 1 VIEW COMPARISON:  Portable chest x-ray of April 02, 2016 FINDINGS: The lungs are well-expanded. The pulmonary interstitial markings have decreased. The pulmonary vascularity is less engorged. There is no pneumothorax. The cardiac silhouette is mildly enlarged though stable. There is small left pleural effusion. A tiny right pleural effusion is suspected. The endotracheal tube tip lies 4.6 cm above the carina. The esophagogastric tube tip projects below the inferior margin of the image. The Swan-Ganz catheter tip projects over the proximal right main pulmonary artery. The mediastinal drain and left chest tube are in stable  position. IMPRESSION: Decreased pulmonary interstitial edema. Stable left lower lobe atelectasis and small left pleural effusion. Probable trace right pleural effusion. No pneumothorax. The support tubes are in stable position. Electronically Signed   By: David  Martinique M.D.   On: 04/03/2016 08:12   Dg Chest Port 1 View  Result Date: 04/02/2016 CLINICAL DATA:  Status post CABG EXAM: PORTABLE CHEST 1 VIEW COMPARISON:  03/29/2016 FINDINGS: Endotracheal tube with the tip 5.2 cm above the carina. Nasogastric tube coursing below the diaphragm. Swan-Ganz catheter with the tip projecting over the right ventricular outflow tract. Left-sided chest tube directed  towards the apex. Small left pleural effusion. Bilateral diffuse interstitial thickening. No pneumothorax. Stable cardiomegaly. Interval CABG. IMPRESSION: 1. Endotracheal tube with the tip 5.2 cm above the carina. 2. Nasogastric tube coursing below the diaphragm. 3. Swan-Ganz catheter with the tip projecting over the right ventricular outflow tract. 4. Mild pulmonary vascular congestion. 5. Left-sided chest tube without a pneumothorax. Small left pleural effusion. Electronically Signed   By: Kathreen Devoid   On: 04/02/2016 16:09     Assessment/Plan: S/P Procedure(s) (LRB): CORONARY ARTERY BYPASS GRAFTING (CABG) X 5 UTILIZING LEFT INTERNAL MAMMARY ARTERY AND ENDOSCOPICALLY HARVESTED GREATER SAPHENOUS VEIN ; Mammary to LAD, Sequencial 1st to 2nd Diagonal, Vein to OM, Vein to Distal RCA. (N/A) TRANSESOPHAGEAL ECHOCARDIOGRAM (TEE) (N/A) FEMORAL ARTERIAL LINE INSERTION (Left) INCIDENTAL LEFT MAMMARY LYMPH NODE BIOPSY (Left) Mobilize Diuresis Diabetes control Continue foley due to strict I&O, patient in ICU and urinary output monitoring See progression orders Expected Acute  Blood - loss Anemia Will need aggressive resp treatment postop  renal function stable   [name] 04/03/2016 11:52 AM

## 2016-04-03 NOTE — Telephone Encounter (Signed)
This encounter was created in error - please disregard.

## 2016-04-03 NOTE — Procedures (Signed)
Extubation Procedure Note  Patient Details:   Name: David Brandt DOB: [birth year] MRN: [id number]   Airway Documentation:     Evaluation  O2 sats: stable throughout Complications: No apparent complications Patient did tolerate procedure well. Bilateral Breath Sounds: Expiratory wheezes   Yes  Patient tolerated rapid wean. VC 0.9 L and NIF -32. Positive for cuff leak. Patient extubated to a 6 Lpm nasal cannula. No signs of dyspnea or stridor. Patient instructed on the Incentive Spirometer achieving 622mL, three times. RN at bedside. Patient resting comfortably.   Myrtie Neither 04/03/2016, 8:39 AM

## 2016-04-03 NOTE — Progress Notes (Signed)
Pulmonary Consult Note  Patient name: David Brandt Medical record number: [id number] Date of birth: [birth year] Age: 70 y.o. Gender: male PCP: [name], MD  Date: 04/03/2016 Reason for Consult: Wheezing, Pre-operative evaluation given hx of ?COPD and OSA  Referring Physician: [name], MD  breief  David Brandt is a 70 y/o man with a hx of tobacco abuse as well as obesity and OSA, treated with CPAP. He does not have a diagnosis of COPD, but does take PRN albuterol and reports frequent wheezing which resolves with a albuterol. He denies cough productive of sputum. He was admitted for UA and found to have left main disease s/p CABG. Pulmonary was consulted to best manage his respiratory symptoms.  SUBJECTIVE/OVERNIGHT/INTERVAL HX S/p CABG, extubated today AM without issues. Feels well with no complaints   Temp:  [96.3 F (35.7 C)-100.8 F (38.2 C)] 99.7 F (37.6 C) (10/10 0900) Pulse Rate:  [88-98] 94 (10/10 0900) Resp:  [12-29] 25 (10/10 0900) BP: (99-141)/(51-68) 131/59 (10/10 0900) SpO2:  [92 %-100 %] 95 % (10/10 0900) Arterial Line BP: (88-126)/(52-73) 111/58 (10/09 1945) FiO2 (%):  [40 %-80 %] 40 % (10/10 0800) Weight:  [307 lb 5.1 oz (139.4 kg)] 307 lb 5.1 oz (139.4 kg) (10/10 0500)  Intake/Output Summary (Last 24 hours) at 04/03/16 1024 Last data filed at 04/03/16 1000  Gross per 24 hour  Intake          6756.59 ml  Output             4753 ml  Net          2003.59 ml   Physical exam  Gen: Middle aged man in no distress HEENT: On nasal cannula.  Moist mucus membranes Pulm: Clear, no wheeze Card: Normal S1, S2, RRR Abd: Soft, + BS Extremities: Trace edema  PULMONARY  Recent Labs Lab 04/02/16 1634 04/02/16 1828 04/02/16 2019 04/02/16 2115 04/02/16 2255 04/03/16 0509  PHART 7.300* 7.368 7.323*  --  7.380 7.442  PCO2ART 47.3 40.5 38.6  --  36.6 37.4  PO2ART 108.0 70.0* 58.0*  --  66.0* 72.0*  HCO3 23.7 23.4 20.1  --  21.6 25.3  TCO2 25 25  21 21 23 26   O2SAT 98.0 94.0 89.0  --  92.0 94.0    CBC  Recent Labs Lab 04/02/16 1528 04/02/16 2107 04/02/16 2115 04/03/16 0400  HGB 10.7* 11.3* 11.2* 10.9*  HCT 34.6* 36.3* 33.0* 35.0*  WBC 25.3* 18.3*  --  19.3*  PLT 148* 162  --  165    COAGULATION  Recent Labs Lab 03/29/16 1442 04/01/16 0502 04/02/16 1528  INR 1.04 1.04 1.51    CARDIAC  No results for input(s): TROPONINI in the last 168 hours. No results for input(s): PROBNP in the last 168 hours.   CHEMISTRY  Recent Labs Lab 03/30/16 0156 03/31/16 0339 04/01/16 0502 04/02/16 0506  04/02/16 1159 04/02/16 1240 04/02/16 1408 04/02/16 1526 04/02/16 2107 04/02/16 2115 04/03/16 0400  NA 138 138 136 137  < > 135 135 138 141  --  138 137  K 3.7 3.7 3.8 3.9  < > 4.6 4.6 3.9 4.2  --  4.7 4.6  CL 105 104 102 103  < > 100* 101 103  --   --  104 107  CO2 25 25 23 24   --   --   --   --   --   --   --  21*  GLUCOSE 107* 105* 108* 132*  < >  218* 244* 192* 153*  --  191* 124*  BUN 12 16 18 16   < > 16 17 17   --   --  15 13  CREATININE 0.97 1.09 1.08 1.13  < > 1.00 1.00 1.00  --  1.02 0.90 1.08  CALCIUM 9.1 9.1 9.0 9.0  --   --   --   --   --   --   --  7.9*  MG  --   --   --   --   --   --   --   --   --  2.7*  --  2.5*  < > = values in this interval not displayed. Estimated Creatinine Clearance: 92.2 mL/min (by C-G formula based on SCr of 1.08 mg/dL).   LIVER  Recent Labs Lab 03/29/16 1442 04/01/16 0502 04/02/16 1528  AST  --  29  --   ALT  --  28  --   ALKPHOS  --  89  --   BILITOT  --  0.6  --   PROT  --  6.3*  --   ALBUMIN  --  3.4*  --   INR 1.04 1.04 1.51     INFECTIOUS No results for input(s): LATICACIDVEN, PROCALCITON in the last 168 hours.   ENDOCRINE CBG (last 3)   Recent Labs  04/03/16 0758 04/03/16 0858 04/03/16 1003  GLUCAP 126* 140* 138*   IMAGING x48h  - image(s) personally visualized  -   highlighted in bold Dg Chest Port 1 View  Result Date: 04/03/2016 CLINICAL DATA:   Status post CABG yesterday. EXAM: PORTABLE CHEST 1 VIEW COMPARISON:  Portable chest x-ray of April 02, 2016 FINDINGS: The lungs are well-expanded. The pulmonary interstitial markings have decreased. The pulmonary vascularity is less engorged. There is no pneumothorax. The cardiac silhouette is mildly enlarged though stable. There is small left pleural effusion. A tiny right pleural effusion is suspected. The endotracheal tube tip lies 4.6 cm above the carina. The esophagogastric tube tip projects below the inferior margin of the image. The Swan-Ganz catheter tip projects over the proximal right main pulmonary artery. The mediastinal drain and left chest tube are in stable position. IMPRESSION: Decreased pulmonary interstitial edema. Stable left lower lobe atelectasis and small left pleural effusion. Probable trace right pleural effusion. No pneumothorax. The support tubes are in stable position. Electronically Signed   By: David  Martinique M.D.   On: 04/03/2016 08:12   Dg Chest Port 1 View  Result Date: 04/02/2016 CLINICAL DATA:  Status post CABG EXAM: PORTABLE CHEST 1 VIEW COMPARISON:  03/29/2016 FINDINGS: Endotracheal tube with the tip 5.2 cm above the carina. Nasogastric tube coursing below the diaphragm. Swan-Ganz catheter with the tip projecting over the right ventricular outflow tract. Left-sided chest tube directed towards the apex. Small left pleural effusion. Bilateral diffuse interstitial thickening. No pneumothorax. Stable cardiomegaly. Interval CABG. IMPRESSION: 1. Endotracheal tube with the tip 5.2 cm above the carina. 2. Nasogastric tube coursing below the diaphragm. 3. Swan-Ganz catheter with the tip projecting over the right ventricular outflow tract. 4. Mild pulmonary vascular congestion. 5. Left-sided chest tube without a pneumothorax. Small left pleural effusion. 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 No wheezing on exam Post extubation ABG reviewed- 7.42/37/72/94% He does not need Bipap now. We will continue his CPAP at  home setting at night and during naps Continue nebs PRN.  Marshell Garfinkel MD [facility] Pager [phone] If no answer or after 3pm call: [phone] 04/03/2016, 10:32 AM

## 2016-04-04 ENCOUNTER — Inpatient Hospital Stay (HOSPITAL_COMMUNITY): Payer: Medicare PPO

## 2016-04-04 LAB — GLUCOSE, CAPILLARY
GLUCOSE-CAPILLARY: 115 mg/dL — AB (ref 65–99)
GLUCOSE-CAPILLARY: 121 mg/dL — AB (ref 65–99)
GLUCOSE-CAPILLARY: 129 mg/dL — AB (ref 65–99)
Glucose-Capillary: 135 mg/dL — ABNORMAL HIGH (ref 65–99)
Glucose-Capillary: 139 mg/dL — ABNORMAL HIGH (ref 65–99)
Glucose-Capillary: 143 mg/dL — ABNORMAL HIGH (ref 65–99)

## 2016-04-04 LAB — CBC
HCT: 29.9 % — ABNORMAL LOW (ref 39.0–52.0)
Hemoglobin: 9.6 g/dL — ABNORMAL LOW (ref 13.0–17.0)
MCH: 28.7 pg (ref 26.0–34.0)
MCHC: 32.1 g/dL (ref 30.0–36.0)
MCV: 89.5 fL (ref 78.0–100.0)
Platelets: 134 10*3/uL — ABNORMAL LOW (ref 150–400)
RBC: 3.34 MIL/uL — ABNORMAL LOW (ref 4.22–5.81)
RDW: 15.8 % — ABNORMAL HIGH (ref 11.5–15.5)
WBC: 18.2 10*3/uL — ABNORMAL HIGH (ref 4.0–10.5)

## 2016-04-04 LAB — BASIC METABOLIC PANEL
Anion gap: 7 (ref 5–15)
BUN: 13 mg/dL (ref 6–20)
CO2: 26 mmol/L (ref 22–32)
Calcium: 8 mg/dL — ABNORMAL LOW (ref 8.9–10.3)
Chloride: 104 mmol/L (ref 101–111)
Creatinine, Ser: 0.97 mg/dL (ref 0.61–1.24)
GFR calc Af Amer: >60 mL/min (ref >60)
GFR calc non Af Amer: >60 mL/min (ref >60)
Glucose, Bld: 135 mg/dL — ABNORMAL HIGH (ref 65–99)
Potassium: 4.1 mmol/L (ref 3.5–5.1)
Sodium: 137 mmol/L (ref 135–145)

## 2016-04-04 MED ORDER — ENOXAPARIN SODIUM 40 MG/0.4ML ~~LOC~~ SOLN
40.0000 mg | Freq: Every day | SUBCUTANEOUS | Status: DC
  Administered 2016-04-04 – 2016-04-10 (×7): 40 mg via SUBCUTANEOUS
  Filled 2016-04-04 (×6): qty 0.4

## 2016-04-04 MED ORDER — FUROSEMIDE 10 MG/ML IJ SOLN
40.0000 mg | Freq: Once | INTRAMUSCULAR | Status: AC
  Administered 2016-04-04: 40 mg via INTRAVENOUS
  Filled 2016-04-04: qty 4

## 2016-04-04 MED ORDER — ORAL CARE MOUTH RINSE
15.0000 mL | Freq: Two times a day (BID) | OROMUCOSAL | Status: DC
  Administered 2016-04-04 – 2016-04-11 (×11): 15 mL via OROMUCOSAL

## 2016-04-04 MED ORDER — IPRATROPIUM-ALBUTEROL 0.5-2.5 (3) MG/3ML IN SOLN
3.0000 mL | Freq: Four times a day (QID) | RESPIRATORY_TRACT | Status: DC
  Administered 2016-04-04 – 2016-04-05 (×4): 3 mL via RESPIRATORY_TRACT
  Filled 2016-04-04 (×4): qty 3

## 2016-04-04 NOTE — Progress Notes (Signed)
Pulmonary Consult Note  Patient name: David Brandt Medical record number: [id number] Date of birth: 04-06-1946 Age: 70 y.o. Gender: male PCP: [name], MD  Date: 04/04/2016 Reason for Consult: Wheezing, Pre-operative evaluation given hx of ?COPD and OSA  Referring Physician: [name], MD  breief  David Brandt is a 70 y/o man with a hx of tobacco abuse as well as obesity and OSA, treated with CPAP. He does not have a diagnosis of COPD, but does take PRN albuterol and reports frequent wheezing which resolves with a albuterol. He denies cough productive of sputum. He was admitted for UA and found to have left main disease s/p CABG. Pulmonary was consulted to best manage his respiratory symptoms.  SUBJECTIVE/OVERNIGHT/INTERVAL HX Nurse reports wheezing today am that resolved with nebs.  Temp:  [98.1 F (36.7 C)-99.7 F (37.6 C)] 98.7 F (37.1 C) (10/11 0754) Pulse Rate:  [89-109] 99 (10/11 0900) Resp:  [18-35] 22 (10/11 0900) BP: (84-136)/(46-69) 84/46 (10/11 0900) SpO2:  [86 %-96 %] 92 % (10/11 0900) Arterial Line BP: (106-130)/(49-62) 123/62 (10/11 0600) Weight:  [299 lb 6.2 oz (135.8 kg)] 299 lb 6.2 oz (135.8 kg) (10/11 0600)  Intake/Output Summary (Last 24 hours) at 04/04/16 0946 Last data filed at 04/04/16 0915  Gross per 24 hour  Intake          1081.84 ml  Output             3890 ml  Net         -2808.16 ml   Physical exam  Gen: Middle aged man in no distress HEENT: On nasal cannula.  Moist mucus membranes Pulm: Clear, no wheeze Card: Normal S1, S2, RRR Abd: Soft, + BS Extremities: Trace edema  PULMONARY  Recent Labs Lab 04/02/16 2019  04/02/16 2255 04/03/16 0509 04/03/16 0824 04/03/16 0933 04/03/16 1210 04/03/16 1709  PHART 7.323*  --  7.380 7.442 7.432 7.442  --   --   PCO2ART 38.6  --  36.6 37.4 35.3 35.5  --   --   PO2ART 58.0*  --  66.0* 72.0* 66.0* 58.0*  --   --   HCO3 20.1  --  21.6 25.3 23.4 24.1  --   --   TCO2 21  < > 23 26  24 25   --  25  O2SAT 89.0  --  92.0 94.0 93.0 90.0 63.7  --   < > = values in this interval not displayed.  CBC  Recent Labs Lab 04/03/16 0400 04/03/16 1709 04/03/16 1729 04/04/16 0424  HGB 10.9* 10.9* 10.3* 9.6*  HCT 35.0* 32.0* 31.9* 29.9*  WBC 19.3*  --  18.5* 18.2*  PLT 165  --  157 134*    COAGULATION  Recent Labs Lab 03/29/16 1442 04/01/16 0502 04/02/16 1528  INR 1.04 1.04 1.51    CARDIAC  No results for input(s): TROPONINI in the last 168 hours. No results for input(s): PROBNP in the last 168 hours.   CHEMISTRY  Recent Labs Lab 03/31/16 0339 04/01/16 0502 04/02/16 0506  04/02/16 1408 04/02/16 1526 04/02/16 2107 04/02/16 2115 04/03/16 0400 04/03/16 1709 04/03/16 1729 04/04/16 0424  NA 138 136 137  < > 138 141  --  138 137 139  --  137  K 3.7 3.8 3.9  < > 3.9 4.2  --  4.7 4.6 3.9  --  4.1  CL 104 102 103  < > 103  --   --  104 107 103  --  104  CO2 25 23 24   --   --   --   --   --  21*  --   --  26  GLUCOSE 105* 108* 132*  < > 192* 153*  --  191* 124* 114*  --  135*  BUN 16 18 16   < > 17  --   --  15 13 12   --  13  CREATININE 1.09 1.08 1.13  < > 1.00  --  1.02 0.90 1.08 0.90 0.90 0.97  CALCIUM 9.1 9.0 9.0  --   --   --   --   --  7.9*  --   --  8.0*  MG  --   --   --   --   --   --  2.7*  --  2.5*  --  2.3  --   < > = values in this interval not displayed. Estimated Creatinine Clearance: 101.1 mL/min (by C-G formula based on SCr of 0.97 mg/dL).   LIVER  Recent Labs Lab 03/29/16 1442 04/01/16 0502 04/02/16 1528  AST  --  29  --   ALT  --  28  --   ALKPHOS  --  89  --   BILITOT  --  0.6  --   PROT  --  6.3*  --   ALBUMIN  --  3.4*  --   INR 1.04 1.04 1.51     INFECTIOUS No results for input(s): LATICACIDVEN, PROCALCITON in the last 168 hours.   ENDOCRINE CBG (last 3)   Recent Labs  04/03/16 2345 04/04/16 0355 04/04/16 0751  GLUCAP 146* 135* 139*   IMAGING x48h  - image(s) personally visualized  -   highlighted in bold Dg  Chest Port 1 View  Result Date: 04/04/2016 CLINICAL DATA:  Followup left chest tube EXAM: PORTABLE CHEST 1 VIEW COMPARISON:  04/03/2016 FINDINGS: Swan-Ganz catheter has been removed. Right internal jugular venous access sheath remains in place. Left chest tube remains in place. No pneumothorax. Slightly less pleural density on the left. Continued lower lobe volume loss left worse than right, but with improvement since yesterday. IMPRESSION: No pneumothorax. Less pleural density on the left. Better aeration in the lower lobes. Electronically Signed   By: Nelson Chimes M.D.   On: 04/04/2016 09:14   Dg Chest Port 1 View  Result Date: 04/03/2016 CLINICAL DATA:  Status post CABG yesterday. EXAM: PORTABLE CHEST 1 VIEW COMPARISON:  Portable chest x-ray of April 02, 2016 FINDINGS: The lungs are well-expanded. The pulmonary interstitial markings have decreased. The pulmonary vascularity is less engorged. There is no pneumothorax. The cardiac silhouette is mildly enlarged though stable. There is small left pleural effusion. A tiny right pleural effusion is suspected. The endotracheal tube tip lies 4.6 cm above the carina. The esophagogastric tube tip projects below the inferior margin of the image. The Swan-Ganz catheter tip projects over the proximal right main pulmonary artery. The mediastinal drain and left chest tube are in stable position. IMPRESSION: Decreased pulmonary interstitial edema. Stable left lower lobe atelectasis and small left pleural effusion. Probable trace right pleural effusion. No pneumothorax. The support tubes are in stable position. Electronically Signed   By: David  Martinique M.D.   On: 04/03/2016 08:12   Dg Chest Port 1 View  Result Date: 04/02/2016 CLINICAL DATA:  Status post CABG EXAM: PORTABLE CHEST 1 VIEW COMPARISON:  03/29/2016 FINDINGS: Endotracheal tube with the tip 5.2 cm above the carina. Nasogastric tube coursing below the diaphragm. Swan-Ganz  catheter with the tip projecting  over the right ventricular outflow tract. Left-sided chest tube directed towards the apex. Small left pleural effusion. Bilateral diffuse interstitial thickening. No pneumothorax. Stable cardiomegaly. Interval CABG. IMPRESSION: 1. Endotracheal tube with the tip 5.2 cm above the carina. 2. Nasogastric tube coursing below the diaphragm. 3. Swan-Ganz catheter with the tip projecting over the right ventricular outflow tract. 4. Mild pulmonary vascular congestion. 5. Left-sided chest tube without a pneumothorax. Small left pleural effusion. 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 Start standing duo nebs Aggressive pulmonary toilet. OOB, incentive spirometry  Marshell Garfinkel MD [facility] Pager [phone] If no answer or after 3pm call: [phone] 04/04/2016, 9:46 AM

## 2016-04-04 NOTE — Progress Notes (Signed)
      [address]       [place] [address]             [phone]      Evening Rounds  POD # 2 CABG  Up in chair with family in room  BP 125/63   Pulse 92   Temp 98.6 F (37 C) (Oral)   Resp (!) 26   Ht 5\' 10"  (1.778 m)   Wt 299 lb 6.2 oz (135.8 kg)   SpO2 93%   BMI 42.96 kg/m    Intake/Output Summary (Last 24 hours) at 04/04/16 1806 Last data filed at 04/04/16 1700  Gross per 24 hour  Intake           1038.3 ml  Output             3120 ml  Net          -2081.7 ml   CBG well controlled  Remo Lipps C. Roxan Hockey, MD [facility] [phone]

## 2016-04-04 NOTE — Op Note (Signed)
NAME:  David Brandt, REEL NO.:  [id number]  MEDICAL RECORD NO.:  [id number]  LOCATION:                                 FACILITY:  PHYSICIAN:  [name], MD    DATE OF BIRTH:  1946-05-21  DATE OF PROCEDURE:  04/02/2016 DATE OF DISCHARGE:  03/29/2016                              OPERATIVE REPORT   PREOPERATIVE DIAGNOSIS:  Unstable angina.  POSTOPERATIVE DIAGNOSIS:  Unstable angina.  SURGICAL PROCEDURE:  Coronary artery bypass grafting x5 with the left internal mammary to the left anterior descending coronary artery, intramyocardial position, sequential reversed saphenous vein graft to the first and second diagonal, reversed saphenous vein graft to the obtuse marginal, reversed saphenous vein graft to the distal right coronary artery with stent placement of left femoral arterial line. Right greater saphenous vein harvesting thigh and calf, endoscopically.  SURGEON:  [name], MD.  FIRST ASSISTANT:  John Giovanni, PA-C.  BRIEF HISTORY:  The patient is a 70 year old male with a long history of smoking, who presented with increasing symptoms of shortness of breath with exertion and chest discomfort.  This precipitated cardiac catheterization by Dr. 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  In addition, the patient had circumflex disease of greater than 80% in a large dominant obtuse marginal supplying the majority of the lateral wall.  The distal circumflex branches were very small and high in the AV groove.  The first diagonal was subtotally occluded.  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  After evaluations  by the Pulmonary Service and improving the patient's respiratory status is much as possible, he underwent coronary artery bypass grafting.  Risks and options had been discussed with him in detail and with his wife and he was agreeable with proceeding.  DESCRIPTION OF PROCEDURE:  With Swan-Ganz and arterial line monitors in place, the patient underwent general endotracheal anesthesia without incident.  Skin of the chest and legs was prepped with Betadine and draped in usual sterile manner.  Appropriate time-out was performed and then using the [employer] endo vein harvesting system, the right greater saphenous vein was harvested from the right calf and thigh.  Median sternotomy was performed.  Left internal mammary artery was dissected down as a pedicle graft.  The distal artery was divided and had excellent free flow.  Pericardium was opened.  The patient was systemically heparinized.  Ascending aorta was cannulated.  The right atrium was cannulated.  An aortic root vent cardioplegia needle was introduced into the ascending aorta.  The patient was placed on cardiopulmonary bypass 2.4 L/min/m2.  Sites of anastomosis were inspected and dissected.  As noted, the LAD proper was actually deeply intramyocardial.  The patient had significant intimal thickening and calcified plaque on the right lateral aspect of the ascending aorta. The area of cannulation and at the level of the innominate vein was free  of calcific plaque.  An aortic root vent cardioplegia needle was introduced into the ascending aorta.  The patient was placed on cardiopulmonary bypass 2.4 L/min/m2.  Aortic crossclamp was applied 500 mL cold blood potassium cardioplegia was administered with diastolic arrest of the heart.  Myocardial septal temperature was monitored throughout the crossclamp.  Attention was turned first to the very distal right coronary artery which was opened.  It was a very large vessel at least 3 mm in size.   A 1.5 mm probe was easily passed into the posterior descending and posterior lateral branches.  Using a running 7- 0, distal anastomosis was performed with second reversed saphenous vein graft.  The heart was then elevated and a large circumflex branch was easily identified and partially intramyocardial.  The vessel was opened and admitted a 1.5 mm probe.  Vessel was 2.5-3 mm in size.  Using a running 7-0 Prolene, distal anastomosis was performed.  We then turned our attention to the anterior wall.  A smaller first diagonal was identified.  The second diagonal was also identified.  The LAD proper was intramyocardial.  We then carefully dissected into the septum and identified the LAD, avoiding entering the right ventricle.  With the LAD identified, we then proceeded with a sequential reversed saphenous vein graft to the first diagonal and to the second diagonal, each sewed with a running 8-0 Prolene.  The left anterior descending coronary artery was then bypassed with a left internal mammary.  The LAD was opened and admitted a 1.5 mm probe proximally and distally.  Using a running 8-0 Prolene, anastomosis was completed with the crossclamp still in place. Three punch aortotomies were performed, avoiding the area along the right side of the aorta that was severely calcified and each of the 3 vein grafts were anastomosed to the ascending aorta.  The bulldog was then removed from the mammary artery with prompt rise in myocardial septal temperature.  The heart was allowed to passively fill and de- aired through the proximal anastomoses.  The proximal anastomoses were then completed and aortic crossclamp was removed with total crossclamp time of 126 minutes.  Sites of anastomoses were inspected and were free of bleeding.  The patient was started on low-dose dopamine and milrinone infusion with body temperature rewarmed to 37 degrees.  Atrial and ventricular pacing wires were applied.  The  patient was given bronchodilators and then resumed ventilation and he was then ventilated and weaned from cardiopulmonary bypass.  TEE showed good myocardial function and volume status.  The patient remained critically dilated and ultimately we started Levophed.  With this, the patient remained stable off cardiopulmonary bypass.  He was decannulated in usual fashion. Protamine sulfate was administered with operative field hemostatic.  The pericardium was loosely reapproximated.  The left pleural tube and a Blake mediastinal drain were left in place.  The sternum was closed with #6 stainless steel wire.  Fascia was closed with interrupted 0 Vicryl, running 3-0 Vicryl in subcutaneous tissue, and 4-0 subcuticular stitch in the skin edges.  Dry dressings were applied.  Sponge and needle count was reported as correct at completion of the procedure.  At the completion of the case, TEE showed good left ventricular wall motion. The right ventricle was functioning well with very trace mitral regurgitation.  The patient did not require any blood products during the operative procedure.  Total pump time was 175 minutes.  The patient at the start of the procedure, had a TEE probe placed by Dr.  Marveen Reeks with the findings under separate note but overall ventricular function was preserved.  The patient had trace to mild mitral regurgitation.  No PFOs.     [name], MD   ______________________________ [name], MD    EG/MEDQ  D:  04/03/2016  T:  04/04/2016  Job:  [id number]

## 2016-04-04 NOTE — Progress Notes (Signed)
RT instructed patient and family on the use of flutter valve. Pt able to demonstrate back good effort.

## 2016-04-05 ENCOUNTER — Inpatient Hospital Stay (HOSPITAL_COMMUNITY): Payer: Medicare PPO

## 2016-04-05 LAB — GLUCOSE, CAPILLARY
GLUCOSE-CAPILLARY: 114 mg/dL — AB (ref 65–99)
GLUCOSE-CAPILLARY: 116 mg/dL — AB (ref 65–99)
GLUCOSE-CAPILLARY: 117 mg/dL — AB (ref 65–99)
GLUCOSE-CAPILLARY: 127 mg/dL — AB (ref 65–99)
GLUCOSE-CAPILLARY: 137 mg/dL — AB (ref 65–99)
Glucose-Capillary: 129 mg/dL — ABNORMAL HIGH (ref 65–99)

## 2016-04-05 LAB — CBC
HCT: 30.2 % — ABNORMAL LOW (ref 39.0–52.0)
Hemoglobin: 9.6 g/dL — ABNORMAL LOW (ref 13.0–17.0)
MCH: 28.6 pg (ref 26.0–34.0)
MCHC: 31.8 g/dL (ref 30.0–36.0)
MCV: 89.9 fL (ref 78.0–100.0)
Platelets: 167 10*3/uL (ref 150–400)
RBC: 3.36 MIL/uL — ABNORMAL LOW (ref 4.22–5.81)
RDW: 15.7 % — ABNORMAL HIGH (ref 11.5–15.5)
WBC: 19.7 10*3/uL — ABNORMAL HIGH (ref 4.0–10.5)

## 2016-04-05 LAB — BASIC METABOLIC PANEL
Anion gap: 6 (ref 5–15)
BUN: 17 mg/dL (ref 6–20)
CO2: 28 mmol/L (ref 22–32)
Calcium: 8.4 mg/dL — ABNORMAL LOW (ref 8.9–10.3)
Chloride: 100 mmol/L — ABNORMAL LOW (ref 101–111)
Creatinine, Ser: 0.99 mg/dL (ref 0.61–1.24)
GFR calc Af Amer: >60 mL/min (ref >60)
GFR calc non Af Amer: >60 mL/min (ref >60)
Glucose, Bld: 113 mg/dL — ABNORMAL HIGH (ref 65–99)
Potassium: 3.9 mmol/L (ref 3.5–5.1)
Sodium: 134 mmol/L — ABNORMAL LOW (ref 135–145)

## 2016-04-05 MED ORDER — LEVALBUTEROL HCL 0.63 MG/3ML IN NEBU
0.6300 mg | INHALATION_SOLUTION | RESPIRATORY_TRACT | Status: DC | PRN
  Administered 2016-04-05: 0.63 mg via RESPIRATORY_TRACT
  Filled 2016-04-05: qty 3

## 2016-04-05 MED ORDER — AMIODARONE LOAD VIA INFUSION
150.0000 mg | Freq: Once | INTRAVENOUS | Status: AC
  Administered 2016-04-05: 150 mg via INTRAVENOUS
  Filled 2016-04-05: qty 83.34

## 2016-04-05 MED ORDER — AMIODARONE HCL IN DEXTROSE 360-4.14 MG/200ML-% IV SOLN
INTRAVENOUS | Status: AC
  Filled 2016-04-05: qty 200

## 2016-04-05 MED ORDER — AMIODARONE HCL IN DEXTROSE 360-4.14 MG/200ML-% IV SOLN
30.0000 mg/h | INTRAVENOUS | Status: DC
  Administered 2016-04-05: 07:00:00 via INTRAVENOUS
  Administered 2016-04-05 – 2016-04-06 (×2): 30 mg/h via INTRAVENOUS
  Filled 2016-04-05 (×2): qty 200

## 2016-04-05 MED ORDER — FUROSEMIDE 10 MG/ML IJ SOLN
40.0000 mg | Freq: Once | INTRAMUSCULAR | Status: AC
  Administered 2016-04-05: 40 mg via INTRAVENOUS
  Filled 2016-04-05: qty 4

## 2016-04-05 MED ORDER — IPRATROPIUM BROMIDE 0.02 % IN SOLN
0.5000 mg | RESPIRATORY_TRACT | Status: DC | PRN
  Administered 2016-04-05: 0.5 mg via RESPIRATORY_TRACT
  Filled 2016-04-05: qty 2.5

## 2016-04-05 MED ORDER — AMIODARONE HCL IN DEXTROSE 360-4.14 MG/200ML-% IV SOLN
60.0000 mg/h | INTRAVENOUS | Status: AC
  Administered 2016-04-05 (×2): 60 mg/h via INTRAVENOUS

## 2016-04-05 MED ORDER — IPRATROPIUM-ALBUTEROL 0.5-2.5 (3) MG/3ML IN SOLN: 3.0000 mL | Freq: Four times a day (QID) | RESPIRATORY_TRACT | Status: DC | PRN

## 2016-04-05 NOTE — Progress Notes (Signed)
Pulmonary Consult Note  Patient name: David Brandt Medical record number: [id number] Date of birth: 06/20/1946 Age: 70 y.o. Gender: male PCP: [name], MD  Date: 04/05/2016 Reason for Consult: Wheezing, Pre-operative evaluation given hx of ?COPD and OSA  Referring Physician: [name], MD  breief  David Brandt is a 70 y/o man with a hx of tobacco abuse as well as obesity and OSA, treated with CPAP. He does not have a diagnosis of COPD, but does take PRN albuterol and reports frequent wheezing which resolves with a albuterol. He denies cough productive of sputum. He was admitted for UA and found to have left main disease s/p CABG. Pulmonary was consulted to best manage his respiratory symptoms.  SUBJECTIVE/OVERNIGHT/INTERVAL HX Stable. OOB to chair A fib today AM. Started on amiodarone,  Temp:  [97.6 F (36.4 C)-98.8 F (37.1 C)] 98.5 F (36.9 C) (10/12 0741) Pulse Rate:  [80-135] 87 (10/12 1016) Resp:  [15-31] 22 (10/12 1000) BP: (104-131)/(53-85) 112/53 (10/12 1016) SpO2:  [91 %-99 %] 96 % (10/12 1000) Weight:  [299 lb 9.7 oz (135.9 kg)] 299 lb 9.7 oz (135.9 kg) (10/12 0600)  Intake/Output Summary (Last 24 hours) at 04/05/16 1049 Last data filed at 04/05/16 0400  Gross per 24 hour  Intake            551.4 ml  Output             1685 ml  Net          -1133.6 ml   Physical exam  Gen: Middle aged man in no distress HEENT: On nasal cannula.  Moist mucus membranes Pulm: Clear, no wheeze Card: Normal S1, S2, RRR Abd: Soft, + BS Extremities: Trace edema  PULMONARY  Recent Labs Lab 04/02/16 2019  04/02/16 2255 04/03/16 0509 04/03/16 0824 04/03/16 0933 04/03/16 1210 04/03/16 1709  PHART 7.323*  --  7.380 7.442 7.432 7.442  --   --   PCO2ART 38.6  --  36.6 37.4 35.3 35.5  --   --   PO2ART 58.0*  --  66.0* 72.0* 66.0* 58.0*  --   --   HCO3 20.1  --  21.6 25.3 23.4 24.1  --   --   TCO2 21  < > 23 26 24 25   --  25  O2SAT 89.0  --  92.0 94.0 93.0  90.0 63.7  --   < > = values in this interval not displayed.  CBC  Recent Labs Lab 04/03/16 1729 04/04/16 0424 04/05/16 0417  HGB 10.3* 9.6* 9.6*  HCT 31.9* 29.9* 30.2*  WBC 18.5* 18.2* 19.7*  PLT 157 134* 167    COAGULATION  Recent Labs Lab 03/29/16 1442 04/01/16 0502 04/02/16 1528  INR 1.04 1.04 1.51    CARDIAC  No results for input(s): TROPONINI in the last 168 hours. No results for input(s): PROBNP in the last 168 hours.   CHEMISTRY  Recent Labs Lab 04/01/16 0502 04/02/16 0506  04/02/16 2107 04/02/16 2115 04/03/16 0400 04/03/16 1709 04/03/16 1729 04/04/16 0424 04/05/16 0417  NA 136 137  < >  --  138 137 139  --  137 134*  K 3.8 3.9  < >  --  4.7 4.6 3.9  --  4.1 3.9  CL 102 103  < >  --  104 107 103  --  104 100*  CO2 23 24  --   --   --  21*  --   --  26 28  GLUCOSE  108* 132*  < >  --  191* 124* 114*  --  135* 113*  BUN 18 16  < >  --  15 13 12   --  13 17  CREATININE 1.08 1.13  < > 1.02 0.90 1.08 0.90 0.90 0.97 0.99  CALCIUM 9.0 9.0  --   --   --  7.9*  --   --  8.0* 8.4*  MG  --   --   --  2.7*  --  2.5*  --  2.3  --   --   < > = values in this interval not displayed. Estimated Creatinine Clearance: 99.2 mL/min (by C-G formula based on SCr of 0.99 mg/dL).   LIVER  Recent Labs Lab 03/29/16 1442 04/01/16 0502 04/02/16 1528  AST  --  29  --   ALT  --  28  --   ALKPHOS  --  89  --   BILITOT  --  0.6  --   PROT  --  6.3*  --   ALBUMIN  --  3.4*  --   INR 1.04 1.04 1.51     INFECTIOUS No results for input(s): LATICACIDVEN, PROCALCITON in the last 168 hours.   ENDOCRINE CBG (last 3)   Recent Labs  04/04/16 2357 04/05/16 0344 04/05/16 0738  GLUCAP 115* 117* 129*   IMAGING x48h  - image(s) personally visualized  -   highlighted in bold Dg Chest Port 1 View  Result Date: 04/05/2016 CLINICAL DATA:  Chest tube removed EXAM: PORTABLE CHEST 1 VIEW COMPARISON:  04/04/2016 FINDINGS: Cardiomediastinal silhouette is stable. Small right  pleural effusion. Right basilar atelectasis or infiltrate. No pulmonary edema. Right IJ sheath is unchanged in position. Left chest tube has been removed. Small residual left pleural effusion with left basilar atelectasis or infiltrate. Small left upper lateral pneumothorax. IMPRESSION: Small right pleural effusion. Right basilar atelectasis or infiltrate. No pulmonary edema. Right IJ sheath is unchanged in position. Left chest tube has been removed. Small residual left pleural effusion with left basilar atelectasis or infiltrate. Small left upper lateral pneumothorax. Electronically Signed   By: Lahoma Crocker M.D.   On: 04/05/2016 08:38   Dg Chest Port 1 View  Result Date: 04/04/2016 CLINICAL DATA:  Followup left chest tube EXAM: PORTABLE CHEST 1 VIEW COMPARISON:  04/03/2016 FINDINGS: Swan-Ganz catheter has been removed. Right internal jugular venous access sheath remains in place. Left chest tube remains in place. No pneumothorax. Slightly less pleural density on the left. Continued lower lobe volume loss left worse than right, but with improvement since yesterday. IMPRESSION: No pneumothorax. Less pleural density on the left. Better aeration in the lower lobes. Electronically Signed   By: Nelson Chimes M.D.   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 Continue xopenex, Duo nebs PRN.  Pulmicort bid OOB, incentive spirometry  Marshell Garfinkel MD [facility] Pager [phone] If no answer or after 3pm call: [phone] 04/05/2016, 10:49 AM

## 2016-04-05 NOTE — Discharge Summary (Signed)
Physician Discharge Summary       [address]       [place], [address]             [id number]    Patient ID: David Brandt MRN: [id number] DOB/AGE: May 15, 1946 70 y.o.  Admit date: 03/29/2016 Discharge date: 04/11/2016  Admission Diagnoses: 1. Abnormal cardiovascular stress test 2. Unstable angina ([place]) 3. Multivessel CAD  Active Diagnoses:  1. Essential hypertension 2. Diabetes ([place]) 3.  OSA (obstructive sleep apnea) 4.  COPD, moderate ([place]) 5.  Hyperlipidemia 6.  ABL anemia 7. Post op a fib  Consults: pulmonary/intensive care  Procedure (s):  Nelva Bush, MD (Primary)    Procedures   Intravascular Pressure Wire/FFR Study  Right/Left Heart Cath and Coronary Angiography  Conclusion   Conclusions: 1. Severe 3 vessel coronary artery disease, as detailed below, including 50% distal LMCA/ostial LAD stenoses (FFR 0.70), 80% D1 lesion, 80% ostial LCx stenosis, and diffusely diseased RCA with 99% mid RCA stenosis with TIMI-1 flow and competitive filling of the PDA and rPL branches by left-to-right collaterals. 2. Mildly decreased LV systolic function with inferior akinesis (LVEF 45-50%) 3. Upper normal to mildly elevated left heart filling pressures. 4. Reduced Fick cardiac output.  Recommendations: 1. Transfer to 3W-stepdown for monitoring in the setting of unstable angina with severe three-vessel CAD.  Initiate heparin infusion 4 hours after TR band deflated. 2. Cardiac surgery consultation in AM to evaluate for CABG. 3. Consider viability study to further assess the inferior wall prior to revascularization. 4. Transthoracic echocardiogram to evaluate for significant valvular disease. 5. Continue ASA and metoprolol; will increase atorvastatin to 80 mg daily    Coronary artery bypass grafting x5 with the left internal mammary to the left anterior descending coronary artery, intramyocardial position, sequential reversed saphenous vein graft to  the first and second diagonal, reversed saphenous vein graft to the obtuse marginal, reversed saphenous vein graft to the distal right coronary artery with stent placement of left femoral arterial line. Right greater saphenous vein harvesting thigh and calf, endoscopically by Dr. Servando Snare on 04/02/2016.  History of Presenting Illness: This is a 70 year old African American male with no prior history of coronary artery disease.Recently, he  has had chest pressure with exertion and shortness of breath. He was recently  the beach. He had chest tightness and had to stop to catch his breath. Chest tightness did go away;however, since then,he has had chest pressure, most recently the morning of admission on 03/29/2016. Patient notes episodes of orthopnea, nocturnal dyspnea. For past 3 months he has had to use inhalers because of wheezing .  He had a nuclear stress test which showed LVEF 45-54% and a large inferior and inferior lateral wall MI from the apex to the base with no ischemia. He then underwent a cardiac catheterization by Dr. Saunders Revel. Results showed LVEF 45-50%, severe 3 vessel coronary artery disease (please see official results below), and inferior akinesis.  Dr. Servando Snare was consulted for the consideration of coronary artery bypass grafting surgery. Currently, the patient is in NO acute distress and he denies chest pain.  Dr. Servando Snare discussed the need for coronary artery bypass grafting surgery. Potential risks, benefits, and complications were discussed with the patient and he agreed to proceed with surgery. Pre operative carotid duplex showed no significant internal carotid artery stenosis bilaterally. He underwent a CABG x 5 on 04/02/2016.  Brief Hospital Course:  The patient was extubated the evening of surgery without difficulty. He/she remained afebrile  and hemodynamically stable. He was weaned off of Milrinone and Nor epinephrine drips. Gordy Councilman, a line, chest tubes, and foley were removed  early in the post operative course. Lopressor was started and titrated accordingly. He was volume over loaded and diuresed. He had ABL anemia. He did not require a post op transfusion. His last H and H was 8.7. He went into a fib. He was started on Amiodarone. He was weaned off the insulin drip. [name] HGA1C pre op was 6.3. He is likely pre diabetic and will need further surveillance by his medical doctor of his [facility] after discharge. The patient was felt surgically stable for transfer from the ICU to PCTU for further convalescence on 04/07/2016. He continues to progress with cardiac rehab. He was ambulating on room air. He has been tolerating a diet and has had a bowel movement. Epicardial pacing wires and chest tube sutures will be removed prior to discharge.  He continues to ambulate independently.  He is tolerating a diet and his pain is well controlled.  He is felt medically stable for discharge home today.  Latest Vital Signs: Blood pressure 128/62, pulse 79, temperature 98.8 F (37.1 C), temperature source Oral, resp. rate 20, height 5\' 10"  (1.778 m), weight 286 lb 3.2 oz (129.8 kg), SpO2 94 %.  Physical Exam: General appearance: alert and cooperative Neurologic: intact Heart: regular rate and rhythm, S1, S2 normal, no murmur, click, rub or gallop Lungs: diminished breath sounds bibasilar Abdomen: soft, non-tender; bowel sounds normal; no masses,  no organomegaly Extremities: extremities normal, atraumatic, no cyanosis or edema and Homans sign is negative, no sign of DVT Wound: sternum intact  Discharge Condition: good  Recent laboratory studies:  Lab Results  Component Value Date   WBC 15.7 (H) 04/10/2016   HGB 8.7 (L) 04/10/2016   HCT 28.4 (L) 04/10/2016   MCV 91.6 04/10/2016   PLT 442 (H) 04/10/2016   Lab Results  Component Value Date   NA 136 04/10/2016   K 3.8 04/10/2016   CL 102 04/10/2016   CO2 26 04/10/2016   CREATININE 1.10 04/10/2016   GLUCOSE 109 (H)  04/10/2016    Diagnostic Studies: Dg Chest Port 1 View  Result Date: 04/05/2016 CLINICAL DATA:  Chest tube removed EXAM: PORTABLE CHEST 1 VIEW COMPARISON:  04/04/2016 FINDINGS: Cardiomediastinal silhouette is stable. Small right pleural effusion. Right basilar atelectasis or infiltrate. No pulmonary edema. Right IJ sheath is unchanged in position. Left chest tube has been removed. Small residual left pleural effusion with left basilar atelectasis or infiltrate. Small left upper lateral pneumothorax. IMPRESSION: Small right pleural effusion. Right basilar atelectasis or infiltrate. No pulmonary edema. Right IJ sheath is unchanged in position. Left chest tube has been removed. Small residual left pleural effusion with left basilar atelectasis or infiltrate. Small left upper lateral pneumothorax. Electronically Signed   By: Lahoma Crocker M.D.   On: 04/05/2016 08:38   Discharge Instructions    Ambulatory referral to Nutrition and Diabetic Education    Complete by:  As directed    New diagnosis of DM 3 weeks ago.  PCP: Dr. Jeanann Lewandowsky.  Taking Janumet at home.  Thanks!  Patient: Please call the [facility] after discharge to schedule an appointment for diabetes education if you do not hear from the center before discharge  [phone]      Discharge Medications:    Medication List    STOP taking these medications   amLODipine 10 MG tablet  Commonly known as:  NORVASC   aspirin 81 MG tablet Replaced by:  aspirin 325 MG EC tablet   felodipine 10 MG 24 hr tablet Commonly known as:  PLENDIL   ibuprofen 600 MG tablet Commonly known as:  ADVIL,MOTRIN     TAKE these medications   allopurinol 300 MG tablet Commonly known as:  ZYLOPRIM Take 150 mg by mouth daily.   amiodarone 400 MG tablet Commonly known as:  PACERONE Take 1 tablet (400 mg total) by mouth daily. Start taking on:  04/13/2016   ANDROGEL PUMP 20.25 MG/ACT (1.62%) Gel Generic drug:   Testosterone Apply 2 each topically daily.   aspirin 325 MG EC tablet Take 1 tablet (325 mg total) by mouth daily. Replaces:  aspirin 81 MG tablet   atorvastatin 40 MG tablet Commonly known as:  LIPITOR Take 40 mg by mouth every evening.   fluticasone 220 MCG/ACT inhaler Commonly known as:  FLOVENT HFA Inhale 2 puffs into the lungs daily as needed (for shortness of breath).   furosemide 40 MG tablet Commonly known as:  LASIX Take 1 tablet (40 mg total) by mouth daily. For 7 Days   hydrocortisone cream 1 % Apply 1 application topically as needed for itching.   JANUMET XR 50-1000 MG Tb24 Generic drug:  SitaGLIPtin-MetFORMIN HCl Take 1 tablet by mouth every evening.   metoprolol tartrate 25 MG tablet Commonly known as:  LOPRESSOR Take 0.5 tablets (12.5 mg total) by mouth 2 (two) times daily.   OVER THE COUNTER MEDICATION Take 1 capsule by mouth 2 (two) times daily. ProbioticXL   OVER THE COUNTER MEDICATION Take 2 capsules by mouth 2 (two) times daily. OmegaXL   potassium chloride SA 20 MEQ tablet Commonly known as:  K-DUR,KLOR-CON Take 1 tablet (20 mEq total) by mouth daily. For 7 Days   traMADol 50 MG tablet Commonly known as:  ULTRAM Take 1-2 tablets (50-100 mg total) by mouth every 4 (four) hours as needed for moderate pain.   valsartan-hydrochlorothiazide 320-12.5 MG tablet Commonly known as:  DIOVAN-HCT Take 1 tablet by mouth daily.   VISINE OP Place 1 drop into both eyes daily.   Vitamin D3 2000 units Tabs Take 2,000 Units by mouth daily.        The patient has been discharged on:   1.Beta Blocker:  Yes [  x ]                              No   [   ]                              If No, reason:  2.Ace Inhibitor/ARB: Yes Blue.Reese   ]                                     No  [    ]                                     If No, reason:  3.Statin:   Yes [ x  ]                  No  [   ]  If No, reason:  4.Ecasa:  Yes  [ x  ]                   No   [   ]                  If No, reason:  Follow Up Appointments: Follow-up Information    [name], MD .   Specialty:  Internal Medicine Why:  Call for a follow up appointment regarding further surveillance of HGA1C 6.3 (pre diabetes) Contact information: [address] [place] [address] [phone]        [name], MD Follow up on 05/10/2016.   Specialty:  Cardiothoracic Surgery Why:  PA/LAT CXR to be taken (at [facility] which is in the same building as Dr. Everrett Coombe office) on 05/10/2016 at 11:30 am;Appointment time is at 12:00 pm Contact information: [address] [place] [address] [place], MD Follow up on 04/11/2016.   Specialty:  Cardiology Why:  Appointment is at 8:45 Contact information: [place] [address] [phone]           Signed: Cinda Quest 04/11/2016, 8:55 AM

## 2016-04-05 NOTE — Discharge Instructions (Signed)
Coronary Artery Bypass Grafting, Care After °Refer to this sheet in the next few weeks. These instructions provide you with information on caring for yourself after your procedure. Your health care provider may also give you more specific instructions. Your treatment has been planned according to current medical practices, but problems sometimes occur. Call your health care provider if you have any problems or questions after your procedure. °WHAT TO EXPECT AFTER THE PROCEDURE °Recovery from surgery will be different for everyone. Some people feel well after 3 or 4 weeks, while for others it takes longer. After your procedure, it is typical to have the following: °· Nausea and a lack of appetite.   °· Constipation. °· Weakness and fatigue.   °· Depression or irritability.   °· Pain or discomfort at your incision site. °HOME CARE INSTRUCTIONS °· Take medicines only as directed by your health care provider. Do not stop taking medicines or start any new medicines without first checking with your health care provider. °· Take your pulse as directed by your health care provider. °· Perform deep breathing as directed by your health care provider. If you were given a device called an incentive spirometer, use it to practice deep breathing several times a day. Support your chest with a pillow or your arms when you take deep breaths or cough. °· Keep incision areas clean, dry, and protected. Remove or change any bandages (dressings) only as directed by your health care provider. You may have skin adhesive strips over the incision areas. Do not take the strips off. They will fall off on their own. °· Check incision areas daily for any swelling, redness, or drainage. °· If incisions were made in your legs, do the following: °¨ Avoid crossing your legs.   °¨ Avoid sitting for long periods of time. Change positions every 30 minutes.   °¨ Elevate your legs when you are sitting. °· Wear compression stockings as directed by your  health care provider. These stockings help keep blood clots from forming in your legs. °· Take showers once your health care provider approves. Until then, only take sponge baths. Pat incisions dry. Do not rub incisions with a washcloth or towel. Do not take baths, swim, or use a hot tub until your health care provider approves. °· Eat foods that are high in fiber, such as raw fruits and vegetables, whole grains, beans, and nuts. Meats should be lean cut. Avoid canned, processed, and fried foods. °· Drink enough fluid to keep your urine clear or pale yellow. °· Weigh yourself every day. This helps identify if you are retaining fluid that may make your heart and lungs work harder. °· Rest and limit activity as directed by your health care provider. You may be instructed to: °¨ Stop any activity at once if you have chest pain, shortness of breath, irregular heartbeats, or dizziness. Get help right away if you have any of these symptoms. °¨ Move around frequently for short periods or take short walks as directed by your health care provider. Increase your activities gradually. You may need physical therapy or cardiac rehabilitation to help strengthen your muscles and build your endurance. °¨ Avoid lifting, pushing, or pulling anything heavier than 10 lb (4.5 kg) for at least 6 weeks after surgery. °· Do not drive until your health care provider approves.  °· Ask your health care provider when you may return to work. °· Ask your health care provider when you may resume sexual activity. °· Keep all follow-up visits as directed by your health care   provider. This is important. °SEEK MEDICAL CARE IF: °· You have swelling, redness, increasing pain, or drainage at the site of an incision. °· You have a fever. °· You have swelling in your ankles or legs. °· You have pain in your legs.   °· You gain 2 or more pounds (0.9 kg) a day. °· You are nauseous or vomit. °· You have diarrhea.  °SEEK IMMEDIATE MEDICAL CARE IF: °· You have  chest pain that goes to your jaw or arms. °· You have shortness of breath.   °· You have a fast or irregular heartbeat.   °· You notice a "clicking" in your breastbone (sternum) when you move.   °· You have numbness or weakness in your arms or legs. °· You feel dizzy or light-headed.   °MAKE SURE YOU: °· Understand these instructions. °· Will watch your condition. °· Will get help right away if you are not doing well or get worse. °  °This information is not intended to replace advice given to you by your health care provider. Make sure you discuss any questions you have with your health care provider. °  °Document Released: 12/29/2004 Document Revised: 07/02/2014 Document Reviewed: 11/18/2012 °Elsevier Interactive Patient Education ©2016 [employer] ° °

## 2016-04-05 NOTE — Progress Notes (Signed)
Patient ID: David Brandt, male   DOB: [birth year], 70 y.o.   MRN: [id number]  SICU Evening Rounds  Hemodynamically stable In and out of atrial fib today on IV amio. Now afib with rate 80's Urine output good Up in chair.  Continue present care.

## 2016-04-05 NOTE — Progress Notes (Signed)
Patient ID: David Brandt, male   DOB: [birth year], 70 y.o.   MRN: [id number] TCTS DAILY ICU PROGRESS NOTE                   [place] [place]            [place] [address]          [phone]   3 Days Post-Op Procedure(s) (LRB): CORONARY ARTERY BYPASS GRAFTING (CABG) X 5 UTILIZING LEFT INTERNAL MAMMARY ARTERY AND ENDOSCOPICALLY HARVESTED GREATER SAPHENOUS VEIN ; Mammary to LAD, Sequencial 1st to 2nd Diagonal, Vein to OM, Vein to Distal RCA. (N/A) TRANSESOPHAGEAL ECHOCARDIOGRAM (TEE) (N/A) FEMORAL ARTERIAL LINE INSERTION (Left) INCIDENTAL LEFT MAMMARY LYMPH NODE BIOPSY (Left)  Total Length of Stay:  LOS: 7 days   Subjective: Episode afib this am, started on Cordarone   Objective: Vital signs in last 24 hours: Temp:  [97.6 F (36.4 C)-98.8 F (37.1 C)] 98.5 F (36.9 C) (10/12 0741) Pulse Rate:  [80-135] 124 (10/12 0754) Cardiac Rhythm: Normal sinus rhythm (10/12 0002) Resp:  [15-31] 20 (10/12 0754) BP: (84-131)/(46-85) 114/76 (10/12 0754) SpO2:  [91 %-98 %] 93 % (10/12 0754) Weight:  [299 lb 9.7 oz (135.9 kg)] 299 lb 9.7 oz (135.9 kg) (10/12 0600)  Filed Weights   04/03/16 0500 04/04/16 0600 04/05/16 0600  Weight: (!) 307 lb 5.1 oz (139.4 kg) 299 lb 6.2 oz (135.8 kg) 299 lb 9.7 oz (135.9 kg)    Weight change: 3.5 oz (0.1 kg)   Hemodynamic parameters for last 24 hours:    Intake/Output from previous day: 10/11 0701 - 10/12 0700 In: 851.1 [P.O.:480; I.V.:371.1] Out: 2005 [Urine:1945; Chest Tube:60]  Intake/Output this shift: No intake/output data recorded.  Current Meds: Scheduled Meds: . acetaminophen  1,000 mg Oral Q6H   Or  . acetaminophen (TYLENOL) oral liquid 160 mg/5 mL  1,000 mg Per Tube Q6H  . allopurinol  150 mg Oral Daily  . amiodarone      . aspirin EC  325 mg Oral Daily   Or  . aspirin  324 mg Per Tube Daily  . atorvastatin  80 mg Oral QPM  . bisacodyl  10 mg Oral Daily   Or  . bisacodyl  10 mg Rectal Daily  . budesonide  (PULMICORT) nebulizer solution  0.5 mg Nebulization BID  . docusate sodium  200 mg Oral Daily  . enoxaparin (LOVENOX) injection  40 mg Subcutaneous QHS  . insulin aspart  0-24 Units Subcutaneous Q4H  . insulin detemir  25 Units Subcutaneous Daily  . ipratropium-albuterol  3 mL Nebulization Q6H  . mouth rinse  15 mL Mouth Rinse BID  . metoprolol tartrate  12.5 mg Oral BID   Or  . metoprolol tartrate  12.5 mg Per Tube BID  . pantoprazole  40 mg Oral Daily  . sodium chloride flush  3 mL Intravenous Q12H   Continuous Infusions: . sodium chloride Stopped (04/03/16 1900)  . sodium chloride 250 mL (04/03/16 0515)  . sodium chloride Stopped (04/03/16 0800)  . amiodarone 60 mg/hr (04/05/16 0704)   Followed by  . amiodarone    . DOPamine 3 mcg/kg/min (04/05/16 0400)  . lactated ringers 10 mL/hr at 04/05/16 0400  . lactated ringers Stopped (04/02/16 1515)  . nitroGLYCERIN Stopped (04/02/16 1507)  . norepinephrine (LEVOPHED) Adult infusion Stopped (04/03/16 2000)  . phenylephrine (NEO-SYNEPHRINE) Adult infusion Stopped (04/02/16 1507)   PRN Meds:.sodium chloride, lactated ringers, levalbuterol, metoprolol, morphine injection, ondansetron (ZOFRAN) IV, oxyCODONE, sodium chloride  flush, traMADol  General appearance: alert and cooperative Neurologic: intact Heart: regular rate and rhythm, S1, S2 normal, no murmur, click, rub or gallop Lungs: diminished breath sounds bibasilar Abdomen: soft, non-tender; bowel sounds normal; no masses,  no organomegaly Extremities: extremities normal, atraumatic, no cyanosis or edema and Homans sign is negative, no sign of DVT Wound: sternum intact  Lab Results: CBC: Recent Labs  04/04/16 0424 04/05/16 0417  WBC 18.2* 19.7*  HGB 9.6* 9.6*  HCT 29.9* 30.2*  PLT 134* 167   BMET:  Recent Labs  04/04/16 0424 04/05/16 0417  NA 137 134*  K 4.1 3.9  CL 104 100*  CO2 26 28  GLUCOSE 135* 113*  BUN 13 17  CREATININE 0.97 0.99  CALCIUM 8.0* 8.4*      CMET: Lab Results  Component Value Date   WBC 19.7 (H) 04/05/2016   HGB 9.6 (L) 04/05/2016   HCT 30.2 (L) 04/05/2016   PLT 167 04/05/2016   GLUCOSE 113 (H) 04/05/2016   CHOL 126 03/30/2016   TRIG 296 (H) 03/30/2016   HDL 31 (L) 03/30/2016   LDLCALC 36 03/30/2016   ALT 28 04/01/2016   AST 29 04/01/2016   NA 134 (L) 04/05/2016   K 3.9 04/05/2016   CL 100 (L) 04/05/2016   CREATININE 0.99 04/05/2016   BUN 17 04/05/2016   CO2 28 04/05/2016   TSH 0.928 03/23/2010   INR 1.51 04/02/2016   HGBA1C 6.3 (H) 03/31/2016    PT/INR:  Recent Labs  04/02/16 1528  LABPROT 18.3*  INR 1.51   Radiology: No results found.   Assessment/Plan: S/P Procedure(s) (LRB): CORONARY ARTERY BYPASS GRAFTING (CABG) X 5 UTILIZING LEFT INTERNAL MAMMARY ARTERY AND ENDOSCOPICALLY HARVESTED GREATER SAPHENOUS VEIN ; Mammary to LAD, Sequencial 1st to 2nd Diagonal, Vein to OM, Vein to Distal RCA. (N/A) TRANSESOPHAGEAL ECHOCARDIOGRAM (TEE) (N/A) FEMORAL ARTERIAL LINE INSERTION (Left) INCIDENTAL LEFT MAMMARY LYMPH NODE BIOPSY (Left) Mobilize Diuresis Started afb 6am today, now back and forth - loading with amiodarone      David Brandt 04/05/2016 7:59 AM

## 2016-04-06 ENCOUNTER — Inpatient Hospital Stay (HOSPITAL_COMMUNITY): Payer: Medicare PPO

## 2016-04-06 LAB — CBC
HCT: 28.8 % — ABNORMAL LOW (ref 39.0–52.0)
Hemoglobin: 9.3 g/dL — ABNORMAL LOW (ref 13.0–17.0)
MCH: 28.4 pg (ref 26.0–34.0)
MCHC: 32.3 g/dL (ref 30.0–36.0)
MCV: 87.8 fL (ref 78.0–100.0)
Platelets: 225 10*3/uL (ref 150–400)
RBC: 3.28 MIL/uL — ABNORMAL LOW (ref 4.22–5.81)
RDW: 15.5 % (ref 11.5–15.5)
WBC: 14.2 10*3/uL — ABNORMAL HIGH (ref 4.0–10.5)

## 2016-04-06 LAB — GLUCOSE, CAPILLARY
GLUCOSE-CAPILLARY: 131 mg/dL — AB (ref 65–99)
GLUCOSE-CAPILLARY: 112 mg/dL — AB (ref 65–99)
GLUCOSE-CAPILLARY: 134 mg/dL — AB (ref 65–99)
Glucose-Capillary: 108 mg/dL — ABNORMAL HIGH (ref 65–99)
Glucose-Capillary: 113 mg/dL — ABNORMAL HIGH (ref 65–99)
Glucose-Capillary: 95 mg/dL (ref 65–99)

## 2016-04-06 LAB — TSH: TSH: 3.99 u[IU]/mL (ref 0.350–4.500)

## 2016-04-06 LAB — BASIC METABOLIC PANEL
Anion gap: 9 (ref 5–15)
BUN: 15 mg/dL (ref 6–20)
CO2: 26 mmol/L (ref 22–32)
Calcium: 8.3 mg/dL — ABNORMAL LOW (ref 8.9–10.3)
Chloride: 100 mmol/L — ABNORMAL LOW (ref 101–111)
Creatinine, Ser: 0.91 mg/dL (ref 0.61–1.24)
GFR calc Af Amer: >60 mL/min (ref >60)
GFR calc non Af Amer: >60 mL/min (ref >60)
Glucose, Bld: 125 mg/dL — ABNORMAL HIGH (ref 65–99)
Potassium: 3.5 mmol/L (ref 3.5–5.1)
Sodium: 135 mmol/L (ref 135–145)

## 2016-04-06 MED ORDER — AMIODARONE HCL 200 MG PO TABS: 400.0000 mg | ORAL_TABLET | Freq: Every day | ORAL | Status: DC

## 2016-04-06 MED ORDER — POTASSIUM CHLORIDE CRYS ER 20 MEQ PO TBCR: 20.0000 meq | EXTENDED_RELEASE_TABLET | ORAL | Status: DC | PRN

## 2016-04-06 MED ORDER — AMIODARONE HCL 200 MG PO TABS
400.0000 mg | ORAL_TABLET | Freq: Two times a day (BID) | ORAL | Status: DC
Start: 2016-04-06 — End: 2016-04-06

## 2016-04-06 MED ORDER — AMIODARONE HCL 200 MG PO TABS
400.0000 mg | ORAL_TABLET | Freq: Two times a day (BID) | ORAL | Status: DC
  Administered 2016-04-06 – 2016-04-11 (×11): 400 mg via ORAL
  Filled 2016-04-06 (×11): qty 2

## 2016-04-06 MED ORDER — FUROSEMIDE 10 MG/ML IJ SOLN
40.0000 mg | Freq: Once | INTRAMUSCULAR | Status: AC
  Administered 2016-04-06: 40 mg via INTRAVENOUS
  Filled 2016-04-06: qty 4

## 2016-04-06 MED ORDER — AMIODARONE HCL IN DEXTROSE 360-4.14 MG/200ML-% IV SOLN: 30.0000 mg/h | INTRAVENOUS | Status: DC

## 2016-04-06 MED ORDER — POTASSIUM CHLORIDE 10 MEQ/100ML IV SOLN: 10.0000 meq | INTRAVENOUS | Status: DC

## 2016-04-06 MED ORDER — AMIODARONE HCL IN DEXTROSE 360-4.14 MG/200ML-% IV SOLN: 60.0000 mg/h | INTRAVENOUS | Status: DC

## 2016-04-06 NOTE — Progress Notes (Signed)
Pulmonary Consult Note  Patient name: David Brandt Medical record number: [id number] Date of birth: [birth year] Age: 70 y.o. Gender: male PCP: [name], MD  Date: 04/06/2016 Reason for Consult: Wheezing, Pre-operative evaluation given hx of ?COPD and OSA  Referring Physician: [name], MD  breief  Mr. Enderby is a 70 y/o man with a hx of tobacco abuse as well as obesity and OSA, treated with CPAP. He does not have a diagnosis of COPD, but does take PRN albuterol and reports frequent wheezing which resolves with a albuterol. He denies cough productive of sputum. He was admitted for UA and found to have left main disease s/p CABG. Pulmonary was consulted to best manage his respiratory symptoms.  SUBJECTIVE/OVERNIGHT/INTERVAL HX Stable. OOB to chair A fib today AM. Started on amiodarone,  Temp:  [98.2 F (36.8 C)-98.8 F (37.1 C)] 98.2 F (36.8 C) (10/13 0804) Pulse Rate:  [79-126] 84 (10/13 0800) Resp:  [15-29] 15 (10/13 0800) BP: (85-165)/(53-138) 129/72 (10/13 0800) SpO2:  [91 %-100 %] 99 % (10/13 0800) Weight:  [298 lb 15.1 oz (135.6 kg)] 298 lb 15.1 oz (135.6 kg) (10/13 0600)  Intake/Output Summary (Last 24 hours) at 04/06/16 0917 Last data filed at 04/06/16 0700  Gross per 24 hour  Intake           642.18 ml  Output             1165 ml  Net          -522.82 ml   Physical exam  Gen: Middle aged man in no distress HEENT: On nasal cannula.  Moist mucus membranes Pulm: Clear, no wheeze Card: Normal S1, S2, RRR Abd: Soft, + BS Extremities: Trace edema  PULMONARY  Recent Labs Lab 04/02/16 2019  04/02/16 2255 04/03/16 0509 04/03/16 0824 04/03/16 0933 04/03/16 1210 04/03/16 1709  PHART 7.323*  --  7.380 7.442 7.432 7.442  --   --   PCO2ART 38.6  --  36.6 37.4 35.3 35.5  --   --   PO2ART 58.0*  --  66.0* 72.0* 66.0* 58.0*  --   --   HCO3 20.1  --  21.6 25.3 23.4 24.1  --   --   TCO2 21  < > 23 26 24 25   --  25  O2SAT 89.0  --  92.0 94.0 93.0  90.0 63.7  --   < > = values in this interval not displayed.  CBC  Recent Labs Lab 04/04/16 0424 04/05/16 0417 04/06/16 0724  HGB 9.6* 9.6* 9.3*  HCT 29.9* 30.2* 28.8*  WBC 18.2* 19.7* 14.2*  PLT 134* 167 225    COAGULATION  Recent Labs Lab 04/01/16 0502 04/02/16 1528  INR 1.04 1.51    CARDIAC  No results for input(s): TROPONINI in the last 168 hours. No results for input(s): PROBNP in the last 168 hours.   CHEMISTRY  Recent Labs Lab 04/02/16 0506  04/02/16 2107  04/03/16 0400 04/03/16 1709 04/03/16 1729 04/04/16 0424 04/05/16 0417 04/06/16 0724  NA 137  < >  --   < > 137 139  --  137 134* 135  K 3.9  < >  --   < > 4.6 3.9  --  4.1 3.9 3.5  CL 103  < >  --   < > 107 103  --  104 100* 100*  CO2 24  --   --   --  21*  --   --  26 28 26  GLUCOSE 132*  < >  --   < > 124* 114*  --  135* 113* 125*  BUN 16  < >  --   < > 13 12  --  13 17 15   CREATININE 1.13  < > 1.02  < > 1.08 0.90 0.90 0.97 0.99 0.91  CALCIUM 9.0  --   --   --  7.9*  --   --  8.0* 8.4* 8.3*  MG  --   --  2.7*  --  2.5*  --  2.3  --   --   --   < > = values in this interval not displayed. Estimated Creatinine Clearance: 107.7 mL/min (by C-G formula based on SCr of 0.91 mg/dL).   LIVER  Recent Labs Lab 04/01/16 0502 04/02/16 1528  AST 29  --   ALT 28  --   ALKPHOS 89  --   BILITOT 0.6  --   PROT 6.3*  --   ALBUMIN 3.4*  --   INR 1.04 1.51     INFECTIOUS No results for input(s): LATICACIDVEN, PROCALCITON in the last 168 hours.   ENDOCRINE CBG (last 3)   Recent Labs  04/05/16 2355 04/06/16 0357 04/06/16 0802  GLUCAP 114* 108* 113*   IMAGING x48h  - image(s) personally visualized  -   highlighted in bold Dg Chest Port 1 View  Result Date: 04/06/2016 CLINICAL DATA:  Cardiac surgery EXAM: PORTABLE CHEST 1 VIEW COMPARISON:  Chest radiograph from one day prior. FINDINGS: Right internal jugular central venous catheter terminates in the right internal jugular vein. Sternotomy  wires appear aligned and intact. Stable cardiomediastinal silhouette with mild cardiomegaly. No pneumothorax. Trace right and small left pleural effusions appear decreased bilaterally. Mild pulmonary edema appears improved. Decreased bibasilar atelectasis. IMPRESSION: 1. Mild congestive heart failure, improved . 2. Trace right and small left pleural effusions, decreased bilaterally . 3. Decreased bibasilar atelectasis. Electronically Signed   By: Ilona Sorrel M.D.   On: 04/06/2016 08:15   Dg Chest Port 1 View  Result Date: 04/05/2016 CLINICAL DATA:  Chest tube removed EXAM: PORTABLE CHEST 1 VIEW COMPARISON:  04/04/2016 FINDINGS: Cardiomediastinal silhouette is stable. Small right pleural effusion. Right basilar atelectasis or infiltrate. No pulmonary edema. Right IJ sheath is unchanged in position. Left chest tube has been removed. Small residual left pleural effusion with left basilar atelectasis or infiltrate. Small left upper lateral pneumothorax. IMPRESSION: Small right pleural effusion. Right basilar atelectasis or infiltrate. No pulmonary edema. Right IJ sheath is unchanged in position. Left chest tube has been removed. Small residual left pleural effusion with left basilar atelectasis or infiltrate. Small left upper lateral pneumothorax. Electronically Signed   By: Lahoma Crocker M.D.   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 Continue xopenex, atrovent nebs PRN.  Pulmicort bid OOB, incentive spirometry  PCCM will be available as needed. Please call with any questions.   Marshell Garfinkel MD [facility] Pager [phone] If no answer or after 3pm call: [phone] 04/06/2016, 9:17 AM

## 2016-04-06 NOTE — Progress Notes (Signed)
  Amiodarone Drug - Drug Interaction Consult Note  Recommendations:  Amiodarone is metabolized by the cytochrome P450 system and therefore has the potential to cause many drug interactions. Amiodarone has an average plasma half-life of 50 days (range 20 to 100 days).   There is potential for drug interactions to occur several weeks or months after stopping treatment and the onset of drug interactions may be slow after initiating amiodarone.   [x]  Statins: Increased risk of myopathy. Simvastatin- restrict dose to 20mg  daily. Other statins: counsel patients to report any muscle pain or weakness immediately.  []  Anticoagulants: Amiodarone can increase anticoagulant effect. Consider warfarin dose reduction. Patients should be monitored closely and the dose of anticoagulant altered accordingly, remembering that amiodarone levels take several weeks to stabilize.  []  Antiepileptics: Amiodarone can increase plasma concentration of phenytoin, the dose should be reduced. Note that small changes in phenytoin dose can result in large changes in levels. Monitor patient and counsel on signs of toxicity.  [x]  Beta blockers: increased risk of bradycardia, AV block and myocardial depression. Sotalol - avoid concomitant use.  []   Calcium channel blockers (diltiazem and verapamil): increased risk of bradycardia, AV block and myocardial depression.  []   Cyclosporine: Amiodarone increases levels of cyclosporine. Reduced dose of cyclosporine is recommended.  []  Digoxin dose should be halved when amiodarone is started.  [x]  Diuretics: increased risk of cardiotoxicity if hypokalemia occurs.  []  Oral hypoglycemic agents (glyburide, glipizide, glimepiride): increased risk of hypoglycemia. Patient's glucose levels should be monitored closely when initiating amiodarone therapy.   []  Drugs that prolong the QT interval:  Torsades de pointes risk may be increased with concurrent use - avoid if possible.  Monitor QTc,  also keep magnesium/potassium WNL if concurrent therapy can't be avoided. Marland Kitchen Antibiotics: e.g. fluoroquinolones, erythromycin. . Antiarrhythmics: e.g. quinidine, procainamide, disopyramide, sotalol. . Antipsychotics: e.g. phenothiazines, haloperidol.  . Lithium, tricyclic antidepressants, and methadone.  Shatasha Lambing S. Alford Highland, PharmD, BCPS Clinical Staff Pharmacist Pager [phone] Thank You,  [place], [place]  04/06/2016 11:02 AM

## 2016-04-06 NOTE — Care Management Important Message (Signed)
Important Message  Patient Details  Name: David Brandt MRN: [id number] Date of Birth: 11-Oct-1945   Medicare Important Message Given:  Yes    Nathen May 04/06/2016, 9:57 AM

## 2016-04-06 NOTE — Progress Notes (Signed)
04/06/2016 1100 Verbal order Dr. Servando Snare ok to give oral Amiodarone now and d/c gtt. Orders enacted. Will continue to closely monitor patient.  Havannah Streat, Arville Lime

## 2016-04-06 NOTE — Progress Notes (Addendum)
04/06/2016 7:18 PM Noted on AM labs K 3.5. Ordered oral KCL per protocol since central line d/c.  David Brandt, Arville Lime

## 2016-04-06 NOTE — Progress Notes (Signed)
Patient ID: David Brandt, male   DOB: 11-Nov-1945, 70 y.o.   MRN: [id number] TCTS DAILY ICU PROGRESS NOTE                   [address]            [place] [address]          [phone]   4 Days Post-Op Procedure(s) (LRB): CORONARY ARTERY BYPASS GRAFTING (CABG) X 5 UTILIZING LEFT INTERNAL MAMMARY ARTERY AND ENDOSCOPICALLY HARVESTED GREATER SAPHENOUS VEIN ; Mammary to LAD, Sequencial 1st to 2nd Diagonal, Vein to OM, Vein to Distal RCA. (N/A) TRANSESOPHAGEAL ECHOCARDIOGRAM (TEE) (N/A) FEMORAL ARTERIAL LINE INSERTION (Left) INCIDENTAL LEFT MAMMARY LYMPH NODE BIOPSY (Left)  Total Length of Stay:  LOS: 8 days   Subjective: Still limited ambulation, gets sob  Objective: Vital signs in last 24 hours: Temp:  [98.2 F (36.8 C)-98.8 F (37.1 C)] 98.2 F (36.8 C) (10/13 0804) Pulse Rate:  [79-126] 84 (10/13 0800) Cardiac Rhythm: Normal sinus rhythm (10/13 0800) Resp:  [15-29] 15 (10/13 0800) BP: (85-165)/(60-138) 129/72 (10/13 0800) SpO2:  [91 %-100 %] 99 % (10/13 0800) Weight:  [298 lb 15.1 oz (135.6 kg)] 298 lb 15.1 oz (135.6 kg) (10/13 0600)  Filed Weights   04/04/16 0600 04/05/16 0600 04/06/16 0600  Weight: 299 lb 6.2 oz (135.8 kg) 299 lb 9.7 oz (135.9 kg) 298 lb 15.1 oz (135.6 kg)    Weight change: -10.6 oz (-0.3 kg)   Hemodynamic parameters for last 24 hours:    Intake/Output from previous day: 10/12 0701 - 10/13 0700 In: 881.6 [I.V.:881.6] Out: 1300 [Urine:1300]  Intake/Output this shift: No intake/output data recorded.  Current Meds: Scheduled Meds: . acetaminophen  1,000 mg Oral Q6H   Or  . acetaminophen (TYLENOL) oral liquid 160 mg/5 mL  1,000 mg Per Tube Q6H  . allopurinol  150 mg Oral Daily  . aspirin EC  325 mg Oral Daily   Or  . aspirin  324 mg Per Tube Daily  . atorvastatin  80 mg Oral QPM  . bisacodyl  10 mg Oral Daily   Or  . bisacodyl  10 mg Rectal Daily  . budesonide (PULMICORT) nebulizer solution  0.5 mg Nebulization BID    . docusate sodium  200 mg Oral Daily  . enoxaparin (LOVENOX) injection  40 mg Subcutaneous QHS  . insulin aspart  0-24 Units Subcutaneous Q4H  . insulin detemir  25 Units Subcutaneous Daily  . mouth rinse  15 mL Mouth Rinse BID  . metoprolol tartrate  12.5 mg Oral BID   Or  . metoprolol tartrate  12.5 mg Per Tube BID  . pantoprazole  40 mg Oral Daily  . sodium chloride flush  3 mL Intravenous Q12H   Continuous Infusions: . sodium chloride Stopped (04/03/16 1900)  . sodium chloride 250 mL (04/03/16 0515)  . sodium chloride Stopped (04/03/16 0800)  . amiodarone 30 mg/hr (04/06/16 0744)  . DOPamine Stopped (04/05/16 0600)  . lactated ringers 10 mL/hr at 04/06/16 0700  . lactated ringers 10 mL/hr at 04/05/16 1037  . nitroGLYCERIN Stopped (04/02/16 1507)  . norepinephrine (LEVOPHED) Adult infusion Stopped (04/03/16 2000)  . phenylephrine (NEO-SYNEPHRINE) Adult infusion Stopped (04/02/16 1507)   PRN Meds:.sodium chloride, ipratropium, lactated ringers, levalbuterol, metoprolol, morphine injection, ondansetron (ZOFRAN) IV, oxyCODONE, sodium chloride flush, traMADol  General appearance: alert and cooperative Neurologic: intact Heart: regular rate and rhythm, S1, S2 normal, no murmur, click, rub or gallop Lungs: diminished breath sounds  bibasilar Abdomen: soft, non-tender; bowel sounds normal; no masses,  no organomegaly Extremities: extremities normal, atraumatic, no cyanosis or edema and Homans sign is negative, no sign of DVT Wound: sternum  intact  Lab Results: CBC: Recent Labs  04/05/16 0417 04/06/16 0724  WBC 19.7* 14.2*  HGB 9.6* 9.3*  HCT 30.2* 28.8*  PLT 167 225   BMET:  Recent Labs  04/05/16 0417 04/06/16 0724  NA 134* 135  K 3.9 3.5  CL 100* 100*  CO2 28 26  GLUCOSE 113* 125*  BUN 17 15  CREATININE 0.99 0.91  CALCIUM 8.4* 8.3*    CMET: Lab Results  Component Value Date   WBC 14.2 (H) 04/06/2016   HGB 9.3 (L) 04/06/2016   HCT 28.8 (L) 04/06/2016    PLT 225 04/06/2016   GLUCOSE 125 (H) 04/06/2016   CHOL 126 03/30/2016   TRIG 296 (H) 03/30/2016   HDL 31 (L) 03/30/2016   LDLCALC 36 03/30/2016   ALT 28 04/01/2016   AST 29 04/01/2016   NA 135 04/06/2016   K 3.5 04/06/2016   CL 100 (L) 04/06/2016   CREATININE 0.91 04/06/2016   BUN 15 04/06/2016   CO2 26 04/06/2016   TSH 0.928 03/23/2010   INR 1.51 04/02/2016   HGBA1C 6.3 (H) 03/31/2016    PT/INR: No results for input(s): LABPROT, INR in the last 72 hours. Radiology: Dg Chest Port 1 View  Result Date: 04/06/2016 CLINICAL DATA:  Cardiac surgery EXAM: PORTABLE CHEST 1 VIEW COMPARISON:  Chest radiograph from one day prior. FINDINGS: Right internal jugular central venous catheter terminates in the right internal jugular vein. Sternotomy wires appear aligned and intact. Stable cardiomediastinal silhouette with mild cardiomegaly. No pneumothorax. Trace right and small left pleural effusions appear decreased bilaterally. Mild pulmonary edema appears improved. Decreased bibasilar atelectasis. IMPRESSION: 1. Mild congestive heart failure, improved . 2. Trace right and small left pleural effusions, decreased bilaterally . 3. Decreased bibasilar atelectasis. Electronically Signed   By: Ilona Sorrel M.D.   On: 04/06/2016 08:15     Assessment/Plan: S/P Procedure(s) (LRB): CORONARY ARTERY BYPASS GRAFTING (CABG) X 5 UTILIZING LEFT INTERNAL MAMMARY ARTERY AND ENDOSCOPICALLY HARVESTED GREATER SAPHENOUS VEIN ; Mammary to LAD, Sequencial 1st to 2nd Diagonal, Vein to OM, Vein to Distal RCA. (N/A) TRANSESOPHAGEAL ECHOCARDIOGRAM (TEE) (N/A) FEMORAL ARTERIAL LINE INSERTION (Left) INCIDENTAL LEFT MAMMARY LYMPH NODE BIOPSY (Left) Mobilize Diuresis Diabetes control Continue to work on pulmonary toilet  Holding sinus , convert to po amiodarone   [name] 04/06/2016 10:46 AM

## 2016-04-07 ENCOUNTER — Inpatient Hospital Stay (HOSPITAL_COMMUNITY): Payer: Medicare PPO

## 2016-04-07 LAB — BASIC METABOLIC PANEL
Anion gap: 7 (ref 5–15)
BUN: 14 mg/dL (ref 6–20)
CO2: 29 mmol/L (ref 22–32)
Calcium: 8.6 mg/dL — ABNORMAL LOW (ref 8.9–10.3)
Chloride: 100 mmol/L — ABNORMAL LOW (ref 101–111)
Creatinine, Ser: 1.08 mg/dL (ref 0.61–1.24)
GFR calc Af Amer: >60 mL/min (ref >60)
GFR calc non Af Amer: >60 mL/min (ref >60)
Glucose, Bld: 109 mg/dL — ABNORMAL HIGH (ref 65–99)
Potassium: 4 mmol/L (ref 3.5–5.1)
Sodium: 136 mmol/L (ref 135–145)

## 2016-04-07 LAB — CBC
HCT: 28.2 % — ABNORMAL LOW (ref 39.0–52.0)
Hemoglobin: 9.1 g/dL — ABNORMAL LOW (ref 13.0–17.0)
MCH: 28.4 pg (ref 26.0–34.0)
MCHC: 32.3 g/dL (ref 30.0–36.0)
MCV: 88.1 fL (ref 78.0–100.0)
Platelets: 259 10*3/uL (ref 150–400)
RBC: 3.2 MIL/uL — ABNORMAL LOW (ref 4.22–5.81)
RDW: 15.5 % (ref 11.5–15.5)
WBC: 13.5 10*3/uL — ABNORMAL HIGH (ref 4.0–10.5)

## 2016-04-07 LAB — GLUCOSE, CAPILLARY
GLUCOSE-CAPILLARY: 103 mg/dL — AB (ref 65–99)
GLUCOSE-CAPILLARY: 145 mg/dL — AB (ref 65–99)
Glucose-Capillary: 95 mg/dL (ref 65–99)
Glucose-Capillary: 113 mg/dL — ABNORMAL HIGH (ref 65–99)
Glucose-Capillary: 105 mg/dL — ABNORMAL HIGH (ref 65–99)

## 2016-04-07 LAB — MAGNESIUM: Magnesium: 2.1 mg/dL (ref 1.7–2.4)

## 2016-04-07 MED ORDER — SODIUM CHLORIDE 0.9 % IV SOLN: 250.0000 mL | INTRAVENOUS | Status: DC | PRN

## 2016-04-07 MED ORDER — SORBITOL 70 % SOLN
60.0000 mL | Freq: Every day | Status: DC | PRN
  Filled 2016-04-07: qty 60

## 2016-04-07 MED ORDER — INSULIN ASPART 100 UNIT/ML ~~LOC~~ SOLN
0.0000 [IU] | Freq: Three times a day (TID) | SUBCUTANEOUS | Status: DC
  Administered 2016-04-09 – 2016-04-10 (×2): 2 [IU] via SUBCUTANEOUS

## 2016-04-07 MED ORDER — SODIUM CHLORIDE 0.9% FLUSH: 3.0000 mL | INTRAVENOUS | Status: DC | PRN

## 2016-04-07 MED ORDER — FUROSEMIDE 40 MG PO TABS
40.0000 mg | ORAL_TABLET | Freq: Every day | ORAL | Status: DC
  Administered 2016-04-08 – 2016-04-11 (×4): 40 mg via ORAL
  Filled 2016-04-07: qty 2
  Filled 2016-04-07 (×3): qty 1

## 2016-04-07 MED ORDER — FUROSEMIDE 10 MG/ML IJ SOLN
40.0000 mg | Freq: Once | INTRAMUSCULAR | Status: AC
  Administered 2016-04-07: 40 mg via INTRAVENOUS
  Filled 2016-04-07: qty 4

## 2016-04-07 MED ORDER — SODIUM CHLORIDE 0.9% FLUSH
3.0000 mL | Freq: Two times a day (BID) | INTRAVENOUS | Status: DC
  Administered 2016-04-07 – 2016-04-10 (×5): 3 mL via INTRAVENOUS

## 2016-04-07 MED ORDER — MOVING RIGHT ALONG BOOK
Freq: Once | Status: AC
  Administered 2016-04-07: 11:00:00
  Filled 2016-04-07: qty 1

## 2016-04-07 NOTE — Progress Notes (Signed)
5 Days Post-Op Procedure(s) (LRB): CORONARY ARTERY BYPASS GRAFTING (CABG) X 5 UTILIZING LEFT INTERNAL MAMMARY ARTERY AND ENDOSCOPICALLY HARVESTED GREATER SAPHENOUS VEIN ; Mammary to LAD, Sequencial 1st to 2nd Diagonal, Vein to OM, Vein to Distal RCA. (N/A) TRANSESOPHAGEAL ECHOCARDIOGRAM (TEE) (N/A) FEMORAL ARTERIAL LINE INSERTION (Left) INCIDENTAL LEFT MAMMARY LYMPH NODE BIOPSY (Left) Subjective: nsr Walked around ICU 4 L O2  Objective: Vital signs in last 24 hours: Temp:  [97.9 F (36.6 C)-99.8 F (37.7 C)] 97.9 F (36.6 C) (10/14 0900) Pulse Rate:  [72-92] 75 (10/14 0700) Cardiac Rhythm: Normal sinus rhythm (10/14 0400) Resp:  [17-28] 17 (10/14 0700) BP: (100-158)/(48-94) 123/57 (10/14 0700) SpO2:  [96 %-100 %] 99 % (10/14 0754) Weight:  [295 lb 6.7 oz (134 kg)] 295 lb 6.7 oz (134 kg) (10/14 0500)  Hemodynamic parameters for last 24 hours:  stable  Intake/Output from previous day: 10/13 0701 - 10/14 0700 In: 483.5 [P.O.:340; I.V.:143.5] Out: 1850 [Urine:1850] Intake/Output this shift: No intake/output data recorded.       Exam    General- alert and comfortable   Lungs- clear without rales, wheezes   Cor- regular rate and rhythm, no murmur , gallop   Abdomen- soft, non-tender   Extremities - warm, non-tender, minimal edema   Neuro- oriented, appropriate, no focal weakness   Lab Results:  Recent Labs  04/06/16 0724 04/07/16 0434  WBC 14.2* 13.5*  HGB 9.3* 9.1*  HCT 28.8* 28.2*  PLT 225 259   BMET:  Recent Labs  04/06/16 0724 04/07/16 0434  NA 135 136  K 3.5 4.0  CL 100* 100*  CO2 26 29  GLUCOSE 125* 109*  BUN 15 14  CREATININE 0.91 1.08  CALCIUM 8.3* 8.6*    PT/INR: No results for input(s): LABPROT, INR in the last 72 hours. ABG    Component Value Date/Time   PHART 7.442 04/03/2016 0933   HCO3 24.1 04/03/2016 0933   TCO2 25 04/03/2016 1709   ACIDBASEDEF 3.0 (H) 04/02/2016 2255   O2SAT 63.7 04/03/2016 1210   CBG (last 3)   Recent Labs  04/06/16 2333 04/07/16 0316 04/07/16 0905  GLUCAP 95 95 103*    Assessment/Plan: S/P Procedure(s) (LRB): CORONARY ARTERY BYPASS GRAFTING (CABG) X 5 UTILIZING LEFT INTERNAL MAMMARY ARTERY AND ENDOSCOPICALLY HARVESTED GREATER SAPHENOUS VEIN ; Mammary to LAD, Sequencial 1st to 2nd Diagonal, Vein to OM, Vein to Distal RCA. (N/A) TRANSESOPHAGEAL ECHOCARDIOGRAM (TEE) (N/A) FEMORAL ARTERIAL LINE INSERTION (Left) INCIDENTAL LEFT MAMMARY LYMPH NODE BIOPSY (Left) Mobilize Diuresis Diabetes control Plan for transfer to step-down: see transfer orders   LOS: 9 days    Tharon Aquas Trigt III 04/07/2016

## 2016-04-07 NOTE — Progress Notes (Signed)
CARDIAC REHAB PHASE I   Patient stated that he walked a lot this morning and would like to rest. Encouraged patient to walk more tonight. Will f/u with on Monday.   [place], MS 04/07/2016 2:32 PM

## 2016-04-07 NOTE — Progress Notes (Signed)
Patient lying in bed, no needs at this time. Will call later for pain medication. Call light within reach

## 2016-04-08 ENCOUNTER — Inpatient Hospital Stay (HOSPITAL_COMMUNITY): Payer: Medicare PPO

## 2016-04-08 LAB — BASIC METABOLIC PANEL
Anion gap: 10 (ref 5–15)
BUN: 17 mg/dL (ref 6–20)
CO2: 27 mmol/L (ref 22–32)
Calcium: 8.7 mg/dL — ABNORMAL LOW (ref 8.9–10.3)
Chloride: 99 mmol/L — ABNORMAL LOW (ref 101–111)
Creatinine, Ser: 1.06 mg/dL (ref 0.61–1.24)
GFR calc Af Amer: >60 mL/min (ref >60)
GFR calc non Af Amer: >60 mL/min (ref >60)
Glucose, Bld: 110 mg/dL — ABNORMAL HIGH (ref 65–99)
Potassium: 3.3 mmol/L — ABNORMAL LOW (ref 3.5–5.1)
Sodium: 136 mmol/L (ref 135–145)

## 2016-04-08 LAB — CBC
HCT: 27.5 % — ABNORMAL LOW (ref 39.0–52.0)
Hemoglobin: 8.9 g/dL — ABNORMAL LOW (ref 13.0–17.0)
MCH: 28.6 pg (ref 26.0–34.0)
MCHC: 32.4 g/dL (ref 30.0–36.0)
MCV: 88.4 fL (ref 78.0–100.0)
Platelets: 320 10*3/uL (ref 150–400)
RBC: 3.11 MIL/uL — ABNORMAL LOW (ref 4.22–5.81)
RDW: 15.7 % — ABNORMAL HIGH (ref 11.5–15.5)
WBC: 15.1 10*3/uL — ABNORMAL HIGH (ref 4.0–10.5)

## 2016-04-08 LAB — GLUCOSE, CAPILLARY
GLUCOSE-CAPILLARY: 104 mg/dL — AB (ref 65–99)
GLUCOSE-CAPILLARY: 149 mg/dL — AB (ref 65–99)
GLUCOSE-CAPILLARY: 100 mg/dL — AB (ref 65–99)
Glucose-Capillary: 112 mg/dL — ABNORMAL HIGH (ref 65–99)

## 2016-04-08 MED ORDER — SITAGLIP PHOS-METFORMIN HCL ER 50-1000 MG PO TB24: 1.0000 | ORAL_TABLET | Freq: Every evening | ORAL | Status: DC

## 2016-04-08 MED ORDER — IRBESARTAN 150 MG PO TABS
150.0000 mg | ORAL_TABLET | Freq: Every day | ORAL | Status: DC
  Administered 2016-04-08 – 2016-04-11 (×4): 150 mg via ORAL
  Filled 2016-04-08 (×5): qty 1

## 2016-04-08 MED ORDER — METFORMIN HCL ER 500 MG PO TB24
1000.0000 mg | ORAL_TABLET | Freq: Every day | ORAL | Status: DC
  Administered 2016-04-08 – 2016-04-11 (×4): 1000 mg via ORAL
  Filled 2016-04-08 (×4): qty 2

## 2016-04-08 MED ORDER — VALSARTAN-HYDROCHLOROTHIAZIDE 320-12.5 MG PO TABS: 0.5000 | ORAL_TABLET | Freq: Every day | ORAL | Status: DC

## 2016-04-08 MED ORDER — HYDROCHLOROTHIAZIDE 10 MG/ML ORAL SUSPENSION
6.2500 mg | Freq: Every day | ORAL | Status: DC
  Administered 2016-04-08 – 2016-04-11 (×4): 6.25 mg via ORAL
  Filled 2016-04-08 (×4): qty 1.25

## 2016-04-08 MED ORDER — LINAGLIPTIN 5 MG PO TABS
5.0000 mg | ORAL_TABLET | Freq: Every day | ORAL | Status: DC
  Administered 2016-04-08 – 2016-04-11 (×4): 5 mg via ORAL
  Filled 2016-04-08 (×4): qty 1

## 2016-04-08 NOTE — Progress Notes (Signed)
   04/08/16 0039  BiPAP/CPAP/SIPAP  BiPAP/CPAP/SIPAP Pt Type Adult  Mask Type Full face mask  Mask Size Large  Set Rate 0 breaths/min  Respiratory Rate 18 breaths/min  IPAP 12 cmH20  EPAP 12 cmH2O  Oxygen Percent 36 %  Flow Rate 4 lpm  BiPAP/CPAP/SIPAP CPAP  Patient Home Equipment Yes  Auto Titrate No  Patient is placed on his home unit CPAP with 4L O2 bleed in.

## 2016-04-08 NOTE — Progress Notes (Signed)
Pt assisted to ambulate 114ft in hall using RW and 4LO2.  No complaints, no tele alarms.  To chair after walk with call bell and urinal in reach and family present.  Will con't plan of care.

## 2016-04-08 NOTE — Progress Notes (Addendum)
6 Days Post-Op Procedure(s) (LRB): CORONARY ARTERY BYPASS GRAFTING (CABG) X 5 UTILIZING LEFT INTERNAL MAMMARY ARTERY AND ENDOSCOPICALLY HARVESTED GREATER SAPHENOUS VEIN ; Mammary to LAD, Sequencial 1st to 2nd Diagonal, Vein to OM, Vein to Distal RCA. (N/A) TRANSESOPHAGEAL ECHOCARDIOGRAM (TEE) (N/A) FEMORAL ARTERIAL LINE INSERTION (Left) INCIDENTAL LEFT MAMMARY LYMPH NODE BIOPSY (Left) Subjective: Feels pretty well, no new C/O  Objective: Vital signs in last 24 hours: Temp:  [97.9 F (36.6 C)-99.1 F (37.3 C)] 98.1 F (36.7 C) (10/15 0516) Pulse Rate:  [80-90] 85 (10/15 0516) Cardiac Rhythm: Normal sinus rhythm (10/14 1900) Resp:  [20-24] 20 (10/14 2001) BP: (116-144)/(52-104) 116/53 (10/15 0516) SpO2:  [96 %-100 %] 96 % (10/15 0721) Weight:  [293 lb 12.8 oz (133.3 kg)-294 lb 3.2 oz (133.4 kg)] 293 lb 12.8 oz (133.3 kg) (10/15 0516)  Hemodynamic parameters for last 24 hours:    Intake/Output from previous day: 10/14 0701 - 10/15 0700 In: -  Out: 1500 [Urine:1500] Intake/Output this shift: No intake/output data recorded.  General appearance: alert, cooperative and no distress Heart: regular rate and rhythm Lungs: dim in bases Abdomen: benign- obese Extremities: + LE edema Wound: incis healing well  Lab Results:  Recent Labs  04/07/16 0434 04/08/16 0218  WBC 13.5* 15.1*  HGB 9.1* 8.9*  HCT 28.2* 27.5*  PLT 259 320   BMET:  Recent Labs  04/07/16 0434 04/08/16 0218  NA 136 136  K 4.0 3.3*  CL 100* 99*  CO2 29 27  GLUCOSE 109* 110*  BUN 14 17  CREATININE 1.08 1.06  CALCIUM 8.6* 8.7*    PT/INR: No results for input(s): LABPROT, INR in the last 72 hours. ABG    Component Value Date/Time   PHART 7.442 04/03/2016 0933   HCO3 24.1 04/03/2016 0933   TCO2 25 04/03/2016 1709   ACIDBASEDEF 3.0 (H) 04/02/2016 2255   O2SAT 63.7 04/03/2016 1210   CBG (last 3)   Recent Labs  04/07/16 1645 04/07/16 2006 04/08/16 0604  GLUCAP 105* 145* 104*     Meds Scheduled Meds: . allopurinol  150 mg Oral Daily  . amiodarone  400 mg Oral Q12H   Followed by  . [START ON 04/13/2016] amiodarone  400 mg Oral Daily  . aspirin EC  325 mg Oral Daily   Or  . aspirin  324 mg Per Tube Daily  . atorvastatin  80 mg Oral QPM  . bisacodyl  10 mg Oral Daily   Or  . bisacodyl  10 mg Rectal Daily  . budesonide (PULMICORT) nebulizer solution  0.5 mg Nebulization BID  . docusate sodium  200 mg Oral Daily  . enoxaparin (LOVENOX) injection  40 mg Subcutaneous QHS  . furosemide  40 mg Oral Daily  . insulin aspart  0-24 Units Subcutaneous TID AC & HS  . insulin detemir  25 Units Subcutaneous Daily  . mouth rinse  15 mL Mouth Rinse BID  . metoprolol tartrate  12.5 mg Oral BID   Or  . metoprolol tartrate  12.5 mg Per Tube BID  . pantoprazole  40 mg Oral Daily  . sodium chloride flush  3 mL Intravenous Q12H  . sodium chloride flush  3 mL Intravenous Q12H   Continuous Infusions: . sodium chloride Stopped (04/03/16 1900)  . sodium chloride 250 mL (04/03/16 0515)  . sodium chloride Stopped (04/03/16 0800)  . lactated ringers 10 mL/hr at 04/06/16 0800  . lactated ringers 10 mL/hr at 04/05/16 1037  . nitroGLYCERIN Stopped (04/02/16 1507)  .  norepinephrine (LEVOPHED) Adult infusion Stopped (04/03/16 2000)  . phenylephrine (NEO-SYNEPHRINE) Adult infusion Stopped (04/02/16 1507)   PRN Meds:.sodium chloride, sodium chloride, ipratropium, lactated ringers, levalbuterol, metoprolol, morphine injection, ondansetron (ZOFRAN) IV, oxyCODONE, potassium chloride, sodium chloride flush, sodium chloride flush, sorbitol, traMADol  Xrays Dg Chest Port 1 View  Result Date: 04/07/2016 CLINICAL DATA:  Previous left chest tube. EXAM: PORTABLE CHEST 1 VIEW COMPARISON:  Multiple recent previous exams. FINDINGS: 0702 hours. The lungs are clear wiithout focal pneumonia, edema, pneumothorax or pleural effusion. Interstitial markings are diffusely coarsened with chronic  features. Left base atelectasis/ infiltrate has improved in the interval. The cardiopericardial silhouette is within normal limits for size. Telemetry leads overlie the chest. IMPRESSION: Improving airspace disease left lung base. Electronically Signed   By: Misty Stanley M.D.   On: 04/07/2016 09:32    Assessment/Plan: S/P Procedure(s) (LRB): CORONARY ARTERY BYPASS GRAFTING (CABG) X 5 UTILIZING LEFT INTERNAL MAMMARY ARTERY AND ENDOSCOPICALLY HARVESTED GREATER SAPHENOUS VEIN ; Mammary to LAD, Sequencial 1st to 2nd Diagonal, Vein to OM, Vein to Distal RCA. (N/A) TRANSESOPHAGEAL ECHOCARDIOGRAM (TEE) (N/A) FEMORAL ARTERIAL LINE INSERTION (Left) INCIDENTAL LEFT MAMMARY LYMPH NODE BIOPSY (Left)  1 maintaining sinus rhythm, hemodyn stable, occas systolic HTN but BP mostly controlled- restart ARB/HCTZ at lower dose- cont lasix for now 2 cont to push pulm toilet, wean O2 off 3 routine cardiac rehab 4 labs are stable 5 resume home DM2 meds, d/c insulin and see how he does- needs aggressive outpatient DM/obesity management    LOS: 10 days    David Brandt,David Brandt patient examined and medical record reviewed,agree with above note. Tharon Aquas Trigt III Brandt

## 2016-04-08 NOTE — Progress Notes (Signed)
Patient lying in bed, no needs at this time. Call light within reach. 

## 2016-04-09 LAB — GLUCOSE, CAPILLARY
GLUCOSE-CAPILLARY: 120 mg/dL — AB (ref 65–99)
GLUCOSE-CAPILLARY: 140 mg/dL — AB (ref 65–99)
Glucose-Capillary: 111 mg/dL — ABNORMAL HIGH (ref 65–99)
Glucose-Capillary: 102 mg/dL — ABNORMAL HIGH (ref 65–99)

## 2016-04-09 LAB — BASIC METABOLIC PANEL
Anion gap: 8 (ref 5–15)
BUN: 16 mg/dL (ref 6–20)
CO2: 29 mmol/L (ref 22–32)
Calcium: 8.9 mg/dL (ref 8.9–10.3)
Chloride: 99 mmol/L — ABNORMAL LOW (ref 101–111)
Creatinine, Ser: 1.13 mg/dL (ref 0.61–1.24)
GFR calc Af Amer: >60 mL/min (ref >60)
GFR calc non Af Amer: >60 mL/min (ref >60)
Glucose, Bld: 121 mg/dL — ABNORMAL HIGH (ref 65–99)
Potassium: 3.6 mmol/L (ref 3.5–5.1)
Sodium: 136 mmol/L (ref 135–145)

## 2016-04-09 LAB — CBC
HCT: 28.8 % — ABNORMAL LOW (ref 39.0–52.0)
Hemoglobin: 9 g/dL — ABNORMAL LOW (ref 13.0–17.0)
MCH: 28 pg (ref 26.0–34.0)
MCHC: 31.3 g/dL (ref 30.0–36.0)
MCV: 89.7 fL (ref 78.0–100.0)
Platelets: 382 10*3/uL (ref 150–400)
RBC: 3.21 MIL/uL — ABNORMAL LOW (ref 4.22–5.81)
RDW: 16.1 % — ABNORMAL HIGH (ref 11.5–15.5)
WBC: 17.8 10*3/uL — ABNORMAL HIGH (ref 4.0–10.5)

## 2016-04-09 MED ORDER — PNEUMOCOCCAL VAC POLYVALENT 25 MCG/0.5ML IJ INJ
0.5000 mL | INJECTION | INTRAMUSCULAR | Status: AC
  Administered 2016-04-11: 0.5 mL via INTRAMUSCULAR

## 2016-04-09 NOTE — Progress Notes (Signed)
EPW removed. Pt tolerated it well. Pt educated on bed rest and vital signs taken. Will continue to monitor pt.

## 2016-04-09 NOTE — Progress Notes (Addendum)
[address]       [place] [address]             [phone]      7 Days Post-Op Procedure(s) (LRB): CORONARY ARTERY BYPASS GRAFTING (CABG) X 5 UTILIZING LEFT INTERNAL MAMMARY ARTERY AND ENDOSCOPICALLY HARVESTED GREATER SAPHENOUS VEIN ; Mammary to LAD, Sequencial 1st to 2nd Diagonal, Vein to OM, Vein to Distal RCA. (N/A) TRANSESOPHAGEAL ECHOCARDIOGRAM (TEE) (N/A) FEMORAL ARTERIAL LINE INSERTION (Left) INCIDENTAL LEFT MAMMARY LYMPH NODE BIOPSY (Left)   Subjective:  No new complaints.  Remains on oxygen at 4L.   + ambulation   + BM  Objective: Vital signs in last 24 hours: Temp:  [97.6 F (36.4 C)-98.9 F (37.2 C)] 97.6 F (36.4 C) (10/16 0604) Pulse Rate:  [75-84] 75 (10/16 0604) Cardiac Rhythm: Normal sinus rhythm (10/16 0700) Resp:  [18-20] 20 (10/16 0604) BP: (119-123)/(56-66) 119/66 (10/16 0604) SpO2:  [98 %-100 %] 98 % (10/16 0604) Weight:  [292 lb 12.8 oz (132.8 kg)] 292 lb 12.8 oz (132.8 kg) (10/16 0604)  Intake/Output from previous day: 10/15 0701 - 10/16 0700 In: 472 [P.O.:472] Out: 1150 [Urine:1150]  General appearance: alert, cooperative and no distress Heart: regular rate and rhythm Lungs: clear to auscultation bilaterally Abdomen: soft, non-tender; bowel sounds normal; no masses,  no organomegaly Extremities: extremities normal, atraumatic, no cyanosis or edema Wound: clean and dry  Lab Results:  Recent Labs  04/08/16 0218 04/09/16 0239  WBC 15.1* 17.8*  HGB 8.9* 9.0*  HCT 27.5* 28.8*  PLT 320 382   BMET:  Recent Labs  04/08/16 0218 04/09/16 0239  NA 136 136  K 3.3* 3.6  CL 99* 99*  CO2 27 29  GLUCOSE 110* 121*  BUN 17 16  CREATININE 1.06 1.13  CALCIUM 8.7* 8.9    PT/INR: No results for input(s): LABPROT, INR in the last 72 hours. ABG    Component Value Date/Time   PHART 7.442 04/03/2016 0933   HCO3 24.1 04/03/2016 0933   TCO2 25 04/03/2016 1709   ACIDBASEDEF 3.0 (H) 04/02/2016 2255   O2SAT 63.7  04/03/2016 1210   CBG (last 3)   Recent Labs  04/08/16 1643 04/08/16 2118 04/09/16 0608  GLUCAP 112* 100* 111*    Assessment/Plan: S/P Procedure(s) (LRB): CORONARY ARTERY BYPASS GRAFTING (CABG) X 5 UTILIZING LEFT INTERNAL MAMMARY ARTERY AND ENDOSCOPICALLY HARVESTED GREATER SAPHENOUS VEIN ; Mammary to LAD, Sequencial 1st to 2nd Diagonal, Vein to OM, Vein to Distal RCA. (N/A) TRANSESOPHAGEAL ECHOCARDIOGRAM (TEE) (N/A) FEMORAL ARTERIAL LINE INSERTION (Left) INCIDENTAL LEFT MAMMARY LYMPH NODE BIOPSY (Left)  1. CV- NSR, BP improved- will d/c EPW, continue Lopressor, Avapro/HCTZ 2. Pulm- on 4L of oxygen via [place], small bilateral effusions on CXR yesterday, continue aggressive IS 3. Renal- creatinine slightly elevated today, despite weight being up he is negative on I/O... May benefit from stopping Lasix  4. DM- sugars controlled, continue home medications 5. Dispo- patient stable, will d/c EPW, need to wean oxygen down prior to discharge, will talk about d/c of lasix with Dr. Servando Snare   LOS: 11 days    BARRETT, Junie Panning 04/09/2016  I have seen and examined David Brandt and agree with the above assessment  and plan.  [name] MD Beeper [phone] Office [phone] 04/09/2016 5:42 PM Continue lasix for now  Cr 1.13 Patients family says  no one will be home , would like him to go to snf Maybe  snf Wednesday Patient requesting to see Dr Tamala Julian  or Oval Linsey post op office visit  I have seen and examined David Brandt and agree with the above assessment  and plan.  [name] MD Beeper [phone] Office [phone] 04/09/2016 6:02 PM

## 2016-04-09 NOTE — Progress Notes (Signed)
CARDIAC REHAB PHASE I   PRE:  Rate/Rhythm: 81 SR    BP: sitting 99/74    SaO2: 97 4L, 97 2L  MODE:  Ambulation: 500 ft   POST:  Rate/Rhythm: 102 ST    BP: sitting 131/52     SaO2: 96-97 RA  Pt doing well this am. Weaned down O2 while up in room and walking to RA. Pt continued to be 96-97 RA. Some SOB toward end of walk, rest x2. Independent with RW, no major c/o. Encouraged pt to continue to walk x2 and use IS/flutter. Left on RA in recliner with feet elevated.  [id number]   [place], ACSM 04/09/2016 9:36 AM

## 2016-04-09 NOTE — Care Management Important Message (Signed)
Important Message  Patient Details  Name: David Brandt MRN: [id number] Date of Birth: [birth year]   Medicare Important Message Given:  Yes    [name] Abena 04/09/2016, 1:31 PM

## 2016-04-09 NOTE — Progress Notes (Signed)
Pt says he will place him self on cpap when he is ready for bed.

## 2016-04-10 LAB — GLUCOSE, CAPILLARY
GLUCOSE-CAPILLARY: 91 mg/dL (ref 65–99)
Glucose-Capillary: 97 mg/dL (ref 65–99)
Glucose-Capillary: 96 mg/dL (ref 65–99)
Glucose-Capillary: 127 mg/dL — ABNORMAL HIGH (ref 65–99)

## 2016-04-10 LAB — CBC
HCT: 28.4 % — ABNORMAL LOW (ref 39.0–52.0)
Hemoglobin: 8.7 g/dL — ABNORMAL LOW (ref 13.0–17.0)
MCH: 28.1 pg (ref 26.0–34.0)
MCHC: 30.6 g/dL (ref 30.0–36.0)
MCV: 91.6 fL (ref 78.0–100.0)
Platelets: 442 10*3/uL — ABNORMAL HIGH (ref 150–400)
RBC: 3.1 MIL/uL — ABNORMAL LOW (ref 4.22–5.81)
RDW: 16.4 % — ABNORMAL HIGH (ref 11.5–15.5)
WBC: 15.7 10*3/uL — ABNORMAL HIGH (ref 4.0–10.5)

## 2016-04-10 LAB — BASIC METABOLIC PANEL
Anion gap: 8 (ref 5–15)
BUN: 16 mg/dL (ref 6–20)
CALCIUM: 8.7 mg/dL — AB (ref 8.9–10.3)
CO2: 26 mmol/L (ref 22–32)
CREATININE: 1.1 mg/dL (ref 0.61–1.24)
Chloride: 102 mmol/L (ref 101–111)
GFR calc Af Amer: >60 mL/min (ref >60)
GLUCOSE: 109 mg/dL — AB (ref 65–99)
Potassium: 3.8 mmol/L (ref 3.5–5.1)
Sodium: 136 mmol/L (ref 135–145)

## 2016-04-10 NOTE — Progress Notes (Addendum)
      [address]       [place], [address]             [phone]      8 Days Post-Op Procedure(s) (LRB): CORONARY ARTERY BYPASS GRAFTING (CABG) X 5 UTILIZING LEFT INTERNAL MAMMARY ARTERY AND ENDOSCOPICALLY HARVESTED GREATER SAPHENOUS VEIN ; Mammary to LAD, Sequencial 1st to 2nd Diagonal, Vein to OM, Vein to Distal RCA. (N/A) TRANSESOPHAGEAL ECHOCARDIOGRAM (TEE) (N/A) FEMORAL ARTERIAL LINE INSERTION (Left) INCIDENTAL LEFT MAMMARY LYMPH NODE BIOPSY (Left)   Subjective:  David Brandt has no new complaints.  He is off oxygen.  He and his family have decided on a SNF facility at discharge.  Objective: Vital signs in last 24 hours: Temp:  [98.3 F (36.8 C)-98.7 F (37.1 C)] 98.6 F (37 C) (10/17 0500) Pulse Rate:  [77-88] 78 (10/17 0500) Cardiac Rhythm: Normal sinus rhythm (10/16 1900) Resp:  [16-20] 18 (10/17 0500) BP: (115-124)/(53-64) 124/64 (10/17 0500) SpO2:  [93 %-98 %] 97 % (10/17 0733) Weight:  [291 lb (132 kg)] 291 lb (132 kg) (10/17 0500)  Intake/Output from previous day: 10/16 0701 - 10/17 0700 In: 702 [P.O.:702] Out: 1060 [Urine:1060]  General appearance: alert, cooperative and no distress Heart: regular rate and rhythm Lungs: clear to auscultation bilaterally Abdomen: soft, non-tender; bowel sounds normal; no masses,  no organomegaly Extremities: edema trace Wound: clean and dry  Lab Results:  Recent Labs  04/09/16 0239 04/10/16 0535  WBC 17.8* 15.7*  HGB 9.0* 8.7*  HCT 28.8* 28.4*  PLT 382 442*   BMET:  Recent Labs  04/09/16 0239 04/10/16 0535  NA 136 136  K 3.6 3.8  CL 99* 102  CO2 29 26  GLUCOSE 121* 109*  BUN 16 16  CREATININE 1.13 1.10  CALCIUM 8.9 8.7*    PT/INR: No results for input(s): LABPROT, INR in the last 72 hours. ABG    Component Value Date/Time   PHART 7.442 04/03/2016 0933   HCO3 24.1 04/03/2016 0933   TCO2 25 04/03/2016 1709   ACIDBASEDEF 3.0 (H) 04/02/2016 2255   O2SAT 63.7 04/03/2016 1210    CBG (last 3)   Recent Labs  04/09/16 1623 04/09/16 2057 04/10/16 0624  GLUCAP 102* 140* 97    Assessment/Plan: S/P Procedure(s) (LRB): CORONARY ARTERY BYPASS GRAFTING (CABG) X 5 UTILIZING LEFT INTERNAL MAMMARY ARTERY AND ENDOSCOPICALLY HARVESTED GREATER SAPHENOUS VEIN ; Mammary to LAD, Sequencial 1st to 2nd Diagonal, Vein to OM, Vein to Distal RCA. (N/A) TRANSESOPHAGEAL ECHOCARDIOGRAM (TEE) (N/A) FEMORAL ARTERIAL LINE INSERTION (Left) INCIDENTAL LEFT MAMMARY LYMPH NODE BIOPSY (Left)  1. CV- NSR, BP controlled- continue Lopressor, Avapro/HCTZ combo, Amiodarone 2. Pulm- off oxygen, continue aggressive pulm toilet 3. Renal- creatinine stable, remains hypervolemic continue Lasix 4. DM- sugars controlled,continue current regimen 5. Deconditioning- will place PT consult, patients family not home during day will need SNF placement 6. Dispo- patient stable, off oxygen, continue Lasix, CSW placed, PT consult... .likely ready for discharge for SNF tomorrow   LOS: 12 days    BARRETT, ERIN 04/10/2016   Now patient thinks that family will be able to help him at home. Plan d/c tomorrow I have seen and examined David Brandt and agree with the above assessment  and plan.  [name] MD Beeper [phone] Office [phone] 04/10/2016 9:22 AM

## 2016-04-10 NOTE — Care Management Note (Signed)
Case Management Note Kristi Webster RN, BSN Unit 2W-Case Manager [phone]  Patient Details  Name: David Brandt MRN: [id number] Date of Birth: 08/31/1945  Subjective/Objective:   Pt admitted with abnormal stress test- s/p CABGx5- tx from ICU to 2W on 04/07/16                 Action/Plan: PTA pt lived at home with wife- referral received for ?SNF placement- pt walking 35-500 ft with cardiac rehab- doubtful that insurance will approve STSNF stay for rehab. Met with pt at bedside along with CSW-Shonny Aycock- to discuss d/c plans and needs- per pt he states that he has now got several family members and friends that can assist him while his wife is at work- he feels that he is mobilizing well and states that he wants to return home. Per pt he does not feel like he will need STSNF now and does not feel like he will need any DME. Pt reports that he had spoken with the MD about a recliner- however was told that MD did not feel like he needed a prescription for a recliner as he is doing so well- pt is going to have wife get a recliner on their own. Pt states that at this time he plans to return home with wife and support of family and friends.- no further needs noted at this time- CM can be re-consulted if further needs arise.   Expected Discharge Date:    04/11/16              Expected Discharge Plan:  Home/Self Care  In-House Referral:  Clinical Social Work  Discharge planning Services  CM Consult  Post Acute Care Choice:    Choice offered to:     DME Arranged:    DME Agency:     HH Arranged:    HH Agency:     Status of Service:  Completed, signed off  If discussed at Long Length of Stay Meetings, dates discussed:  04/10/16  Additional Comments:  Webster, Kristi Hall, RN 04/10/2016, 4:08 PM  

## 2016-04-10 NOTE — Progress Notes (Signed)
CARDIAC REHAB PHASE I   PRE:  Rate/Rhythm: 75 SR  BP:  Sitting: 111/65        SaO2: 95 RA  MODE:  Ambulation: 350 ft   POST:  Rate/Rhythm: 92 SR  BP:  Sitting: 131/70         SaO2: 98 RA  Pt ambulated 350 ft on RA, rolling walker, assist x1, steady gait, tolerated well. Pt c/o some DOE, states it is improving, denies any other complaints, declined rest stop. Reinforced sternal precautions, practiced getting in and out of bed, needs practice/reinforcement as able, pt fairly independent, required min assist. Pt states he thinks he may have enough family support to go home at discharge. Pt to recliner after walk, feet elevated, call bell within reach. Will follow.   [id number] Lenna Sciara, RN, BSN 04/10/2016 9:44 AM

## 2016-04-11 LAB — GLUCOSE, CAPILLARY
Glucose-Capillary: 119 mg/dL — ABNORMAL HIGH (ref 65–99)
Glucose-Capillary: 111 mg/dL — ABNORMAL HIGH (ref 65–99)

## 2016-04-11 LAB — CBC
HCT: 29.6 % — ABNORMAL LOW (ref 39.0–52.0)
Hemoglobin: 9.3 g/dL — ABNORMAL LOW (ref 13.0–17.0)
MCH: 28.1 pg (ref 26.0–34.0)
MCHC: 31.4 g/dL (ref 30.0–36.0)
MCV: 89.4 fL (ref 78.0–100.0)
Platelets: 496 10*3/uL — ABNORMAL HIGH (ref 150–400)
RBC: 3.31 MIL/uL — ABNORMAL LOW (ref 4.22–5.81)
RDW: 16.1 % — ABNORMAL HIGH (ref 11.5–15.5)
WBC: 16 10*3/uL — ABNORMAL HIGH (ref 4.0–10.5)

## 2016-04-11 MED ORDER — FUROSEMIDE 40 MG PO TABS
40.0000 mg | ORAL_TABLET | Freq: Every day | ORAL | 0 refills | Status: DC
Start: 2016-04-11 — End: 2016-05-02

## 2016-04-11 MED ORDER — ASPIRIN 325 MG PO TBEC: 325.0000 mg | DELAYED_RELEASE_TABLET | Freq: Every day | ORAL | 0 refills | Status: DC

## 2016-04-11 MED ORDER — METOPROLOL TARTRATE 25 MG PO TABS: 12.5000 mg | ORAL_TABLET | Freq: Two times a day (BID) | ORAL | 3 refills | Status: DC

## 2016-04-11 MED ORDER — AMIODARONE HCL 400 MG PO TABS: 400.0000 mg | ORAL_TABLET | Freq: Every day | ORAL | 1 refills | Status: DC

## 2016-04-11 MED ORDER — TRAMADOL HCL 50 MG PO TABS: 50.0000 mg | ORAL_TABLET | ORAL | 0 refills | Status: DC | PRN

## 2016-04-11 MED ORDER — POTASSIUM CHLORIDE CRYS ER 20 MEQ PO TBCR: 20.0000 meq | EXTENDED_RELEASE_TABLET | Freq: Every day | ORAL | 0 refills | Status: DC

## 2016-04-11 NOTE — Progress Notes (Addendum)
      [address]       [place] [address]             [phone]      9 Days Post-Op Procedure(s) (LRB): CORONARY ARTERY BYPASS GRAFTING (CABG) X 5 UTILIZING LEFT INTERNAL MAMMARY ARTERY AND ENDOSCOPICALLY HARVESTED GREATER SAPHENOUS VEIN ; Mammary to LAD, Sequencial 1st to 2nd Diagonal, Vein to OM, Vein to Distal RCA. (N/A) TRANSESOPHAGEAL ECHOCARDIOGRAM (TEE) (N/A) FEMORAL ARTERIAL LINE INSERTION (Left) INCIDENTAL LEFT MAMMARY LYMPH NODE BIOPSY (Left)   Subjective:  Mr. Galeano has no complaints.  He states he is ready to go home.  +ambulation  + BM  Objective: Vital signs in last 24 hours: Temp:  [98.3 F (36.8 C)-98.8 F (37.1 C)] 98.8 F (37.1 C) (10/18 0521) Pulse Rate:  [76-84] 79 (10/18 0521) Cardiac Rhythm: Normal sinus rhythm (10/18 0700) Resp:  [18-20] 20 (10/18 0521) BP: (111-132)/(62-87) 128/62 (10/18 0521) SpO2:  [94 %-97 %] 94 % (10/18 0727) Weight:  [286 lb 3.2 oz (129.8 kg)] 286 lb 3.2 oz (129.8 kg) (10/18 0521)  Intake/Output from previous day: 10/17 0701 - 10/18 0700 In: 480 [P.O.:480] Out: -   General appearance: alert, cooperative and no distress Heart: regular rate and rhythm Lungs: diminished breath sounds bilaterally Abdomen: soft, non-tender; bowel sounds normal; no masses,  no organomegaly Extremities: edema trace Wound: clean and dry  Lab Results:  Recent Labs  04/09/16 0239 04/10/16 0535  WBC 17.8* 15.7*  HGB 9.0* 8.7*  HCT 28.8* 28.4*  PLT 382 442*   BMET:  Recent Labs  04/09/16 0239 04/10/16 0535  NA 136 136  K 3.6 3.8  CL 99* 102  CO2 29 26  GLUCOSE 121* 109*  BUN 16 16  CREATININE 1.13 1.10  CALCIUM 8.9 8.7*    PT/INR: No results for input(s): LABPROT, INR in the last 72 hours. ABG    Component Value Date/Time   PHART 7.442 04/03/2016 0933   HCO3 24.1 04/03/2016 0933   TCO2 25 04/03/2016 1709   ACIDBASEDEF 3.0 (H) 04/02/2016 2255   O2SAT 63.7 04/03/2016 1210   CBG (last 3)   Recent  Labs  04/10/16 1748 04/10/16 2128 04/11/16 0631  GLUCAP 127* 91 119*    Assessment/Plan: S/P Procedure(s) (LRB): CORONARY ARTERY BYPASS GRAFTING (CABG) X 5 UTILIZING LEFT INTERNAL MAMMARY ARTERY AND ENDOSCOPICALLY HARVESTED GREATER SAPHENOUS VEIN ; Mammary to LAD, Sequencial 1st to 2nd Diagonal, Vein to OM, Vein to Distal RCA. (N/A) TRANSESOPHAGEAL ECHOCARDIOGRAM (TEE) (N/A) FEMORAL ARTERIAL LINE INSERTION (Left) INCIDENTAL LEFT MAMMARY LYMPH NODE BIOPSY (Left)  1. CV- NSR, BP controlled- continue Amiodarone, Lopressor, Avapro 2. Pulm- no acute issues, continue IS 3. Renal- remains hypervolemic continue lasix at discharge 4. DM- sugars controlled 5. Dispo- patient stable, will d/c home today   LOS: 13 days    BARRETT, ERIN 04/11/2016  No fever or chills , wbc mildly elevated and decreasing Serous  drainage from lower incision has stopped  wires out  I have seen and examined [name] and agree with the above assessment  and plan.  [name] MD Beeper [phone] Office [phone] 04/11/2016 11:13 AM

## 2016-04-11 NOTE — Progress Notes (Signed)
Ed completed with pt with good reception. Voiced understanding. Plans to remain quit smoking and increase his exercise. Interested in [facility] and will send referral to [facility].  [id number] Yves Dill CES, ACSM 10:34 AM 04/11/2016

## 2016-04-11 NOTE — Evaluation (Signed)
Physical Therapy Evaluation Patient Details Name: David Brandt MRN: [id number] DOB: [birth year] Today's Date: 04/11/2016   History of Present Illness  This is a 70 year old African American male with no prior history of coronary artery disease, COPD, HLD, DM, HTN,OSA w/ c-pap use, and h/o tobacco use.  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  Clinical Impression  Patient presents at supervision to mod I level with mobility.  Agree with plans for outpatient cardiac rehab without follow up PT needs at this time.  Recommend RW initially at home.  No further skilled PT needs at this time.   Vitals post ambulation: HR 97; SpO2 96%, BP 137/60    Follow Up Recommendations No PT follow up (cardiac rehab)    Equipment Recommendations  Rolling walker with 5" wheels    Recommendations for Other Services       Precautions / Restrictions Precautions Precautions: Sternal Restrictions Weight Bearing Restrictions: Yes      Mobility  Bed Mobility Overal bed mobility: Modified Independent             General bed mobility comments: able to demonstrate appropriate technique with heart pillow to splint chest  Transfers Overall transfer level: Modified independent               General transfer comment: up from recliner no UE support  Ambulation/Gait Ambulation/Gait assistance: Supervision Ambulation Distance (Feet): 400 Feet Assistive device: Rolling walker (2 wheeled) Gait Pattern/deviations: Step-through pattern;Decreased stride length     General Gait Details: reports feels more confident with using walker  Stairs            Wheelchair Mobility    Modified Rankin (Stroke Patients Only)       Balance Overall balance assessment: Needs assistance   Sitting balance-Leahy Scale: Good     Standing balance support: No upper extremity supported Standing  balance-Leahy Scale: Good                               Pertinent Vitals/Pain Pain Assessment: No/denies pain    Home Living Family/patient expects to be discharged to:: Private residence Living Arrangements: Spouse/significant other Available Help at Discharge: Family;Friend(s);Available 24 hours/day Type of Home: House Home Access: Level entry     Home Layout: One level Home Equipment: None;Grab bars - tub/shower Additional Comments: wife works 6 am - 2:30, plans to have friends come by to check during the day    Prior Function Level of Independence: Independent               Hand Dominance   Dominant Hand: Right    Extremity/Trunk Assessment               Lower Extremity Assessment: Overall WFL for tasks assessed         Communication   Communication: No difficulties  Cognition Arousal/Alertness: Awake/alert Behavior During Therapy: WFL for tasks assessed/performed Overall Cognitive Status: Within Functional Limits for tasks assessed                      General Comments General comments (skin integrity, edema, etc.): Reviwed precautions, gradual return to activity and recommendations for follow up outpatient cardiac rehab    Exercises     Assessment/Plan    PT Assessment Patent does not need any further PT  services  PT Problem List            PT Treatment Interventions      PT Goals (Current goals can be found in the Care Plan section)  Acute Rehab PT Goals PT Goal Formulation: All assessment and education complete, DC therapy    Frequency     Barriers to discharge        Co-evaluation               End of Session   Activity Tolerance: Patient tolerated treatment well Patient left: in chair;with call bell/phone within reach           Time: 0910-0938 PT Time Calculation (min) (ACUTE ONLY): 28 min   Charges:   PT Evaluation $PT Eval Moderate Complexity: 1 Procedure PT Treatments $Gait  Training: 8-22 mins   PT G CodesReginia Naas 04-19-16, 10:35 AM  Magda Kiel, [place] 04/19/2016

## 2016-04-11 NOTE — Progress Notes (Signed)
Order received to discharge patient.  Patient expresses readiness to discharge.  Telemetry monitor removed and CCMD notified.  PIV access removed without complication.  Discharge instructions, follow up, medications and instructions for their use were discussed with patient and patient voiced understanding.

## 2016-04-11 NOTE — Care Management Note (Signed)
Case Management Note Marvetta Gibbons RN, BSN Unit 2W-Case Manager [phone]  Patient Details  Name: David Brandt MRN: [id number] Date of Birth: 06-08-1946  Subjective/Objective:   Pt admitted with abnormal stress test- s/p CABGx5- tx from ICU to 2W on 04/07/16                 Action/Plan: PTA pt lived at home with wife- referral received for ?SNF placement- pt walking 35-500 ft with cardiac rehab- doubtful that insurance will approve STSNF stay for rehab. Met with pt at bedside along with CSW-Shonny Aycock- to discuss d/c plans and needs- per pt he states that he has now got several family members and friends that can assist him while his wife is at work- he feels that he is mobilizing well and states that he wants to return home. Per pt he does not feel like he will need STSNF now and does not feel like he will need any DME. Pt reports that he had spoken with the MD about a recliner- however was told that MD did not feel like he needed a prescription for a recliner as he is doing so well- pt is going to have wife get a recliner on their own. Pt states that at this time he plans to return home with wife and support of family and friends.- no further needs noted at this time- CM can be re-consulted if further needs arise.   Expected Discharge Date:    04/11/16              Expected Discharge Plan:  Home/Self Care  In-House Referral:  Clinical Social Work  Discharge planning Services  CM Consult  Post Acute Care Choice:  Durable Medical Equipment Choice offered to:  Patient  DME Arranged:  Gilford Rile rolling DME Agency:  [place]:    [place]:     Status of Service:  Completed, signed off  If discussed at [place] of Stay Meetings, dates discussed:  04/10/16  Discharge Disposition: home/self care   Additional Comments:  04/11/16- 0930- Valentina Gu CM- notified this AM by bedside RN that pt had changed his mind regarding DME needs- and now  would like a RW for home- order has been placed- call made to [facility] with [facility] regarding DME need for RW - Rolling walker to be delivered to room prior to discharge.   Dawayne Patricia, RN 04/11/2016, 10:08 AM

## 2016-04-12 ENCOUNTER — Other Ambulatory Visit: Payer: Self-pay | Admitting: *Deleted

## 2016-04-12 NOTE — Patient Outreach (Signed)
[name]) Care Management  04/12/2016  David Brandt 30-Sep-1945 [id number]   Initial attempt to reach pt however unsuccessful. RN not able to leave a voice message as voice mail not set up. Will scheduled another attempt to reach this pt on tomorrow for transition of care.   Raina Mina, RN Care Management Coordinator [place] Office [phone]

## 2016-04-13 ENCOUNTER — Encounter: Payer: Self-pay | Admitting: *Deleted

## 2016-04-13 ENCOUNTER — Other Ambulatory Visit: Payer: Self-pay | Admitting: *Deleted

## 2016-04-13 NOTE — Patient Outreach (Signed)
David Brandt) Care Management  04/13/2016  David Brandt 1945-09-04 [id number]   RN spoke with pt today and introduced the [facility] program and services. Pt receptive and reports recent events related to her heart cath and diabetes. Pt discussed his issues with diabetes and indicates he takes his CBG once daily. Pt states his primary care doctor would be managing his diabetes and currently pending an appointment. RN briefly educated pt on diabetes and the importance of daily monitoring. Also offered a one-on-one home visit for a more detail description of diabetes and the risk involved if this medical condition is not monitored. Pt remains receptive and accepted the home visit. Scheduled the initial home visit next week. Discussed a plan of care and goals to assist pt with managing his diabetes.   Raina Mina, RN Care Management Coordinator [place] Office [phone]

## 2016-04-19 ENCOUNTER — Other Ambulatory Visit: Payer: Self-pay | Admitting: *Deleted

## 2016-04-19 NOTE — Patient Outreach (Signed)
David Brandt) Care Management   04/19/2016  David Brandt Dec 09, 1945 [id number]  David Brandt is an 70 y.o. male  Subjective:  DM: Pt with lack of knowledge related to diabetes however pt receptive to the information provided and reports all readings over the last week. Discussed A1C and recent readings (CBG) however pt not aware of the purpose of a A1C and normal levels of glucose levels. Pt reports daily CBG and documents all readings for providers to view. Denies any signs or symptoms of hypo-hyperglycemia once review by not able to recite most of the symptoms. Pt not aware of the SICK DAYS related to diabetes however very receptive to being educated today.  MEDICATION: Pt reports taking all his medication with no delays. All medications reviewed with pt's understanding of the purpose of all his medications.  Pt states no needed refills at this time. MEDICAL APPOINTMENT: Pt reports attending all medical appointments with no delays. Pt reports he has sufficient transportation to all appointments.  Objective:   Review of Systems  Constitutional: Negative.   HENT: Negative.   Eyes: Negative.   Respiratory: Negative.   Cardiovascular: Negative.   Gastrointestinal: Negative.   Genitourinary: Negative.   Musculoskeletal: Negative.   Skin: Negative.   Neurological: Negative.   Endo/Heme/Allergies: Negative.   Psychiatric/Behavioral: Negative.     Physical Exam  Constitutional: He is oriented to person, place, and time. He appears well-developed and well-nourished.  HENT:  Right Ear: External ear normal.  Left Ear: External ear normal.  Eyes: EOM are normal.  Neck: Normal range of motion.  Cardiovascular: Normal heart sounds.   Respiratory: Effort normal and breath sounds normal.  GI: Soft. Bowel sounds are normal.  Musculoskeletal: Normal range of motion.  Neurological: He is alert and oriented to person, place, and time.  Skin: Skin is warm and dry.   Psychiatric: He has a normal mood and affect. His behavior is normal. Judgment and thought content normal.    Encounter Medications:   Outpatient Encounter Prescriptions as of 04/19/2016  Medication Sig  . allopurinol (ZYLOPRIM) 300 MG tablet Take 150 mg by mouth daily.  Marland Kitchen amiodarone (PACERONE) 400 MG tablet Take 1 tablet (400 mg total) by mouth daily.  . ANDROGEL PUMP 20.25 MG/ACT (1.62%) GEL Apply 2 each topically daily.  Marland Kitchen aspirin EC 325 MG EC tablet Take 1 tablet (325 mg total) by mouth daily.  Marland Kitchen atorvastatin (LIPITOR) 40 MG tablet Take 40 mg by mouth every evening.  . Cholecalciferol (VITAMIN D3) 2000 units TABS Take 2,000 Units by mouth daily.  . fluticasone (FLOVENT HFA) 220 MCG/ACT inhaler Inhale 2 puffs into the lungs daily as needed (for shortness of breath).   . furosemide (LASIX) 40 MG tablet Take 1 tablet (40 mg total) by mouth daily. For 7 Days  . hydrocortisone cream 1 % Apply 1 application topically as needed for itching.  . metoprolol tartrate (LOPRESSOR) 25 MG tablet Take 0.5 tablets (12.5 mg total) by mouth 2 (two) times daily.  Marland Kitchen OVER THE COUNTER MEDICATION Take 1 capsule by mouth 2 (two) times daily. ProbioticXL  . OVER THE COUNTER MEDICATION Take 2 capsules by mouth 2 (two) times daily. OmegaXL  . potassium chloride SA (K-DUR,KLOR-CON) 20 MEQ tablet Take 1 tablet (20 mEq total) by mouth daily. For 7 Days  . SitaGLIPtin-MetFORMIN HCl (JANUMET XR) 50-1000 MG TB24 Take 1 tablet by mouth every evening.  . Tetrahydrozoline HCl (VISINE OP) Place 1 drop into both eyes daily.  . traMADol Veatrice Bourbon)  50 MG tablet Take 1-2 tablets (50-100 mg total) by mouth every 4 (four) hours as needed for moderate pain.  . valsartan-hydrochlorothiazide (DIOVAN-HCT) 320-12.5 MG per tablet Take 1 tablet by mouth daily.     No facility-administered encounter medications on file as of 04/19/2016.     Functional Status:   In your present state of health, do you have any difficulty performing the  following activities: 04/13/2016 03/29/2016  Hearing? N N  Vision? N N  Difficulty concentrating or making decisions? N N  Walking or climbing stairs? N N  Dressing or bathing? N N  Doing errands, shopping? N N  Preparing Food and eating ? N -  Using the Toilet? N -  In the past six months, have you accidently leaked urine? N -  Do you have problems with loss of bowel control? N -  Managing your Medications? N -  Managing your Finances? N -  Housekeeping or managing your Housekeeping? N -  Some recent data might be hidden    Fall/Depression Screening:    PHQ 2/9 Scores 04/13/2016  PHQ - 2 Score 0  BP (!) 112/58 (BP Location: Left Arm, Patient Position: Sitting, Cuff Size: Normal)   Pulse 74   Resp 20   Ht 1.778 m (5\' 10" )   Wt 273 lb 3.2 oz (123.9 kg)   SpO2 98%   BMI 39.20 kg/m   Assessment:   Introduced to the [facility] program and services for enrollment Case management related to diabetes Follow up on adherence related to medication Follow up on adherence related to attendance to medical appointment.   Plan:  Will completed a physical assessment and obtain a signed consent for enrollment into the [facility] program Will discussed diabetes and educate pt on diabetes (hypo-hyperglycemia). Will also discussed signs andy symptoms and what to do if acute symptoms occur. Review and discussed diabetes related to SICK DAYS and how this will affect his glucose readings. All readings discussed and the importance of daily monitoring and what to do if acute symptoms should occur. Several printable education EMMI provided related to Diabetes and the importance of daily monitoring provided. EMMI: Diabetes and Blood pressure, Diabetes preventing heart Attacks and stroke. All discussed in detail with pt's understanding of his medical issues.  Will review all medications and purpose of these medications. Will also confirm pt has enough supplies and refills. Will strongly encouraged pt to continue  administering his medications as recommended by his provider. Will verify all upcoming appointments and encouraged pt to attend as scheduled to avoid "NO SHOW" fees and expenses. Will also verified pt has sufficient transportation source and provider other resources if needed in the future via [facility] calendar.  Based upon the information received from pt resulting in a generated a plan of care and goals that pt receptive to working toward to improve his health. Will provide primary care provider with the initial home report. Plan to follow up next month with another home visit to re-evaluate pt's progress.   Raina Mina, RN Care Management Coordinator [place] Office [phone]

## 2016-04-23 ENCOUNTER — Other Ambulatory Visit: Payer: Self-pay | Admitting: *Deleted

## 2016-04-23 NOTE — Patient Outreach (Signed)
[place] [facility]) Care Management  04/23/2016  David Brandt 1946-03-29 [id number]   RN received a call from pt inquiring on GI issues. Pt states he has been taking a stool softener but his stool are still hard and he has been straining to get his "get my stool out". RIN inquired on hydration with enough intake of H2O and discussed the difference between and stool softener and laxative. Discussed some products may contain both a softener and laxative stimulant. Pt states he has Senokot that has a dual ingredients of both and he will take this product according to the instructions. RN strongly encouraged pt to he has uncontrol bleeding from the rectum due to constipation to contact his provider. Discussed the risk of perforating his colon and vagal affect for straining could all have an impact on his health. Encouraged hydration to softener his bowels and to avoid straining with possible suppositories if needed. Pt with understanding and will treat accordingly.  Pt continues to do well in managing his diabetes and states he is ambulating on his long driveways and will dress accordingly due to the cold weather this morning when walking. Pt has initiated managing his health more efficiently with the interventions and plan of care discussed las week on the initial home visit. RN will continued to follow up next week and inquired on pt's ongoing management of care.   Will scheduled next transition of care call next week.   Raina Mina, RN Care Management Coordinator [place] Office [phone]

## 2016-04-24 ENCOUNTER — Encounter: Payer: Self-pay | Admitting: *Deleted

## 2016-04-26 ENCOUNTER — Ambulatory Visit: Payer: Self-pay | Admitting: *Deleted

## 2016-05-01 DIAGNOSIS — E291 Testicular hypofunction: Secondary | ICD-10-CM | POA: Diagnosis not present

## 2016-05-01 DIAGNOSIS — R079 Chest pain, unspecified: Secondary | ICD-10-CM | POA: Diagnosis not present

## 2016-05-01 DIAGNOSIS — E119 Type 2 diabetes mellitus without complications: Secondary | ICD-10-CM | POA: Diagnosis not present

## 2016-05-02 ENCOUNTER — Ambulatory Visit (INDEPENDENT_AMBULATORY_CARE_PROVIDER_SITE_OTHER): Payer: Medicare PPO | Admitting: Cardiovascular Disease

## 2016-05-02 ENCOUNTER — Encounter: Payer: Self-pay | Admitting: Cardiovascular Disease

## 2016-05-02 VITALS — BP 126/82 | HR 65 | Ht 70.0 in | Wt 281.2 lb

## 2016-05-02 DIAGNOSIS — I10 Essential (primary) hypertension: Secondary | ICD-10-CM

## 2016-05-02 DIAGNOSIS — Z951 Presence of aortocoronary bypass graft: Secondary | ICD-10-CM | POA: Diagnosis not present

## 2016-05-02 DIAGNOSIS — E7800 Pure hypercholesterolemia, unspecified: Secondary | ICD-10-CM | POA: Diagnosis not present

## 2016-05-02 DIAGNOSIS — Z79899 Other long term (current) drug therapy: Secondary | ICD-10-CM | POA: Diagnosis not present

## 2016-05-02 LAB — BASIC METABOLIC PANEL
BUN: 11 mg/dL (ref 7–25)
CALCIUM: 9.3 mg/dL (ref 8.6–10.3)
CO2: 28 mmol/L (ref 20–31)
Chloride: 102 mmol/L (ref 98–110)
Creat: 1.03 mg/dL (ref 0.70–1.25)
GLUCOSE: 84 mg/dL (ref 65–99)
POTASSIUM: 4.5 mmol/L (ref 3.5–5.3)
SODIUM: 137 mmol/L (ref 135–146)

## 2016-05-02 NOTE — Progress Notes (Signed)
Cardiology Office Note   Date:  05/02/2016   ID:  David Brandt, DOB [birth year], MRN [id number]  PCP:  [name], MD  Cardiologist:   Skeet Latch, MD   Chief Complaint  Patient presents with  . Hospitalization Follow-up    CABG x5      History of Present Illness: David Brandt is a 70 y.o. male with diabetes, OSA on CPAP, hypertension, and hyperlipidemia who presents for follow up after recent CABG.  David Brandt initially presented with chest pressure and shortness of breath 03/2016.  He underwent Lexiscan Myoview on 03/28/16 that revealed LVEF 48% with a large inferior and inferior lateral MI and no evidence of ischemia. There was also basal to mid inferior wall akinesis.  He subsequently underwent left heart catheterization where he was found to have severe three-vessel CAD.  He underwent CABG (LIMA-->LAD, SVG-->D1,D2, SVG-->OM, SVG-->RCA) with Dr. Servando Snare on 04/02/16.  He developed post-operative atrial fibrillation and was started on amiodarone.  There were were no other postoperative complications and he was discharged 04/11/16.  Echo 03/30/16 revealed LVEF 55-60% and was otherwise unremarkable.   Since being discharged from the hospital David Brandt has been doing well.  He feels like a new man.  He denies chest pain or shortness of breath.  He does have some sternal tenderness when he turns the wrong way or when he rolls over in bed.  He denies any lower extremity edema, orthopnea, or PND. He uses his CPAP every night. He walks up and down his driveway twice per day. This is approximately 100 yards. He denies any exertional symptoms. He's also been working on his diet and is limiting his carbohydrate intake as well as the intake of fried and fatty foods.  He denies any palpitations, lightheadedness, or dizziness. He does not recall having any symptoms when he was in atrial fibrillation in the hospital    Past Medical History:  Diagnosis Date  . Abnormal  cardiovascular stress test 03/29/2016  . Allergic rhinitis   . Diabetes ([place]) 03/29/2016  . Gout   . Hyperlipidemia   . Hypertension   . Sleep apnea    cpap    Past Surgical History:  Procedure Laterality Date  . CARDIAC CATHETERIZATION N/A 03/29/2016   Procedure: Right/Left Heart Cath and Coronary Angiography;  Surgeon: Nelva Bush, MD;  Location: [place] [facility];  Service: Cardiovascular;  Laterality: N/A;  . CARDIAC CATHETERIZATION N/A 03/29/2016   Procedure: Intravascular Pressure Wire/FFR Study;  Surgeon: Nelva Bush, MD;  Location: [place] [facility];  Service: Cardiovascular;  Laterality: N/A;  . CARDIAC CATHETERIZATION Left 04/02/2016   Procedure: FEMORAL ARTERIAL LINE INSERTION;  Surgeon: [name], MD;  Location: [place];  Service: Open Heart Surgery;  Laterality: Left;  . COLONOSCOPY    . CORONARY ARTERY BYPASS GRAFT N/A 04/02/2016   Procedure: CORONARY ARTERY BYPASS GRAFTING (CABG) X 5 UTILIZING LEFT INTERNAL MAMMARY ARTERY AND ENDOSCOPICALLY HARVESTED GREATER SAPHENOUS VEIN ; Mammary to LAD, Sequencial 1st to 2nd Diagonal, Vein to OM, Vein to Distal RCA.;  Surgeon: [name], MD;  Location: [place];  Service: Open Heart Surgery;  Laterality: N/A;  . LYMPH NODE BIOPSY Left 04/02/2016   Procedure: INCIDENTAL LEFT MAMMARY LYMPH NODE BIOPSY;  Surgeon: [name], MD;  Location: [place];  Service: Open Heart Surgery;  Laterality: Left;  . mass abdomen  1955  . TEE WITHOUT CARDIOVERSION N/A 04/02/2016   Procedure: TRANSESOPHAGEAL ECHOCARDIOGRAM (TEE);  Surgeon: [name],  MD;  Location: [facility];  Service: Open Heart Surgery;  Laterality: N/A;     Current Outpatient Prescriptions  Medication Sig Dispense Refill  . allopurinol (ZYLOPRIM) 300 MG tablet Take 150 mg by mouth daily.  3  . amiodarone (PACERONE) 400 MG tablet Take 1 tablet (400 mg total) by mouth daily. 30 tablet 1  . ANDROGEL PUMP 20.25 MG/ACT (1.62%) GEL Apply 2 each topically daily.    Marland Kitchen  aspirin EC 325 MG EC tablet Take 1 tablet (325 mg total) by mouth daily. 30 tablet 0  . atorvastatin (LIPITOR) 40 MG tablet Take 40 mg by mouth every evening.  3  . Cholecalciferol (VITAMIN D3) 2000 units TABS Take 2,000 Units by mouth daily.    . fluticasone (FLOVENT HFA) 220 MCG/ACT inhaler Inhale 2 puffs into the lungs daily as needed (for shortness of breath).     . hydrocortisone cream 1 % Apply 1 application topically as needed for itching.    . metoprolol tartrate (LOPRESSOR) 25 MG tablet Take 0.5 tablets (12.5 mg total) by mouth 2 (two) times daily. 60 tablet 3  . OVER THE COUNTER MEDICATION Take 1 capsule by mouth 2 (two) times daily. ProbioticXL    . OVER THE COUNTER MEDICATION Take 2 capsules by mouth 2 (two) times daily. OmegaXL    . SitaGLIPtin-MetFORMIN HCl (JANUMET XR) 50-1000 MG TB24 Take 1 tablet by mouth every evening.    . Tetrahydrozoline HCl (VISINE OP) Place 1 drop into both eyes daily.    . traMADol (ULTRAM) 50 MG tablet Take 1-2 tablets (50-100 mg total) by mouth every 4 (four) hours as needed for moderate pain. 30 tablet 0  . valsartan-hydrochlorothiazide (DIOVAN-HCT) 320-12.5 MG per tablet Take 1 tablet by mouth daily.       No current facility-administered medications for this visit.     Allergies:   Sulfa antibiotics    Social History:  The patient  reports that he quit smoking about 20 months ago. His smoking use included Cigarettes. He has a 47.00 pack-year smoking history. He has never used smokeless tobacco. He reports that he drinks about 2.4 oz of alcohol per week . He reports that he uses drugs, including Marijuana, about 7 times per week.   Family History:  [name] family history includes Colon cancer (age of onset: 64) in his father; Diabetes in his maternal uncle and mother; Heart disease in his maternal aunt; Stroke in his mother.    ROS:  Please see the history of present illness.   Otherwise, review of systems are positive for none.   All other  systems are reviewed and negative.    PHYSICAL EXAM: VS:  BP 126/82   Pulse 65   Ht 5\' 10"  (1.778 m)   Wt 127.6 kg (281 lb 3.2 oz)   BMI 40.35 kg/m  , BMI Body mass index is 40.35 kg/m. GENERAL:  Well appearing HEENT:  Pupils equal round and reactive, fundi not visualized, oral mucosa unremarkable NECK:  No jugular venous distention, waveform within normal limits, carotid upstroke brisk and symmetric, no bruits, no thyromegaly LYMPHATICS:  No cervical adenopathy LUNGS:  Clear to auscultation bilaterally HEART:  RRR.  PMI not displaced or sustained,S1 and S2 within normal limits, no S3, no S4, no clicks, no rubs, no murmurs CHEST: Midline sternal incision healing well.  ABD:  Flat, positive bowel sounds normal in frequency in pitch, no bruits, no rebound, no guarding, no midline pulsatile mass, no hepatomegaly, no splenomegaly EXT:  2 plus pulses throughout, no edema, no cyanosis no clubbing SKIN:  No rashes no nodules NEURO:  Cranial nerves II through XII grossly intact, motor grossly intact throughout PSYCH:  Cognitively intact, oriented to person place and time    EKG:  EKG is ordered today. The ekg ordered today demonstrates sinus rhythm. Rate 65 bpm.   Lexiscan Myoview 03/29/16: Nuclear stress EF: 48%.  The left ventricular ejection fraction is mildly decreased (45-54%).   Large inferior and inferior lateral wall MI from apex to base with no ischemia EF 48% with mid and basal inferior wall akinesis   LHC 03/29/16: Conclusions: 1. Severe 3 vessel coronary artery disease, as detailed below, including 50% distal LMCA/ostial LAD stenoses (FFR 0.70), 80% D1 lesion, 80% ostial LCx stenosis, and diffusely diseased RCA with 99% mid RCA stenosis with TIMI-1 flow and competitive filling of the PDA and rPL branches by left-to-right collaterals. 2. Mildly decreased LV systolic function with inferior akinesis (LVEF 45-50%) 3. Upper normal to mildly elevated left heart filling  pressures. 4. Reduced Fick cardiac output.   Echo  03/30/16:  Study Conclusions  - Left ventricle: The cavity size was normal. Wall thickness was   increased in a pattern of moderate LVH. Systolic function was   normal. The estimated ejection fraction was in the range of 55%   to 60%. Left ventricular diastolic function parameters were   normal. - Left atrium: The atrium was mildly dilated. - Atrial septum: No defect or patent foramen ovale was identified. - Pulmonary arteries: PA peak pressure: 34 mm Hg (S).  Recent Labs: 04/01/2016: ALT 28 04/06/2016: TSH 3.990 04/07/2016: Magnesium 2.1 04/10/2016: BUN 16; Creatinine, Ser 1.10; Potassium 3.8; Sodium 136 04/11/2016: Hemoglobin 9.3; Platelets 496    Lipid Panel    Component Value Date/Time   CHOL 126 03/30/2016 0156   TRIG 296 (H) 03/30/2016 0156   HDL 31 (L) 03/30/2016 0156   CHOLHDL 4.1 03/30/2016 0156   VLDL 59 (H) 03/30/2016 0156   LDLCALC 36 03/30/2016 0156      Wt Readings from Last 3 Encounters:  05/02/16 127.6 kg (281 lb 3.2 oz)  04/19/16 123.9 kg (273 lb 3.2 oz)  04/11/16 129.8 kg (286 lb 3.2 oz)      ASSESSMENT AND PLAN:  # CAD s/p CABG: David Brandt is doing very well post-CABG.  He follows up with Dr. Servando Snare next week.  He has no exertional symptoms.  He has been walking some and I encouraged him to start walking for 10-15 minutes and gradually increase as he is able.   Continue aspirin, metoprolol and atorvastatin.  # Hypertension:  Blood pressure well-controlled.  Continue valsartan-HCTZ and metoprolol.  He was previously on lasix and potassium so we will check a BMP.   # Post-op atrial fibrillation: David Brandt had atrial fibrillation post operatively.  He is currently in sinus rhythm and does not think he has had any recurrent events, though he was asymptomatic when he was in A. fib. He has been loaded with over 8 g at this point. If the plan is to keep him on it for 3 months, would consider  reducing the dose to 20 mg daily. Otherwise, he can be discontinued when he sees Dr. Servando Snare next week.  Consider increasing aspirin to 81 mg once amiodarone is discontinued.  # Hyperlipidemia:  LDL 36 03/2016.  Continue atorvastatin.  # OSA: Encouraged continued use of CPAP.   Current medicines are reviewed at length with the patient today.  The patient does not have concerns regarding medicines.  The following changes have been made:  no change  Labs/ tests ordered today include:   Orders Placed This Encounter  Procedures  . Basic metabolic panel     Disposition:   FU with Eudora Guevarra C. Oval Linsey, MD, [facility] in 6 months.     This note was written with the assistance of speech recognition software.  Please excuse any transcriptional errors.  Signed, Maki Sweetser C. Oval Linsey, MD, [facility]  05/02/2016 9:53 AM    [facility]

## 2016-05-02 NOTE — Patient Instructions (Signed)
Medication Instructions:  STOP ASPIRIN 81 MG   START ASPIRIN 325 MG DAILY   Labwork: BMET AT [place] [facility] ON THE FIRST FLOOR   Testing/Procedures: NONE  Follow-Up: Your physician wants you to follow-up in: [place] will receive a reminder letter in the mail two months in advance. If you don't receive a letter, please call our office to schedule the follow-up appointment.  If you need a refill on your cardiac medications before your next appointment, please call your pharmacy.

## 2016-05-02 NOTE — Addendum Note (Signed)
Addended by: Alvina Filbert B on: 05/02/2016 10:36 AM   Modules accepted: Orders

## 2016-05-03 ENCOUNTER — Other Ambulatory Visit: Payer: Self-pay | Admitting: *Deleted

## 2016-05-03 NOTE — Patient Outreach (Signed)
[facility]) Care Management  05/03/2016  David Brandt 10-Jul-1945 [id number]   RN spoke with pt today with update on his management of care related to his diabetes. Pt reports readings around 92-93 much improved and states his provider (CAD) has also made the suggested to increase his exercise more. Pt states he has been ambulating down his long driveway twice over the last few days to increase his exercise time to at least 30 minutes. Pt states he feels "better" and will continue to participate in improving his health. RN praise pt for his efforts and continues to encouraged adherence to his improving his health. RN reviewed and confirmed pt continues to follow his plan of care with adherence to his medications, medical appointments and adherence to preventive measures related to avoiding readmission with daily monitoring of his diabetes management. RN offered to continue transition of care calls with a follow up next week and reminded pt of the next home visit in two weeks. No other inquire or request at this time.   Raina Mina, RN Care Management Coordinator [place] Office [phone]

## 2016-05-09 ENCOUNTER — Other Ambulatory Visit: Payer: Self-pay | Admitting: Cardiothoracic Surgery

## 2016-05-09 DIAGNOSIS — Z951 Presence of aortocoronary bypass graft: Secondary | ICD-10-CM

## 2016-05-10 ENCOUNTER — Ambulatory Visit
Admission: RE | Admit: 2016-05-10 | Discharge: 2016-05-10 | Disposition: A | Discharge status: Home / Self Care | Payer: Medicare PPO | Source: Ambulatory Visit | Attending: Cardiothoracic Surgery | Admitting: Cardiothoracic Surgery

## 2016-05-10 ENCOUNTER — Ambulatory Visit (INDEPENDENT_AMBULATORY_CARE_PROVIDER_SITE_OTHER): Payer: Self-pay | Admitting: Cardiothoracic Surgery

## 2016-05-10 ENCOUNTER — Encounter: Payer: Self-pay | Admitting: Cardiothoracic Surgery

## 2016-05-10 VITALS — BP 148/88 | HR 71 | Resp 20 | Ht 70.0 in | Wt 280.0 lb

## 2016-05-10 DIAGNOSIS — Z951 Presence of aortocoronary bypass graft: Secondary | ICD-10-CM

## 2016-05-10 DIAGNOSIS — R918 Other nonspecific abnormal finding of lung field: Secondary | ICD-10-CM | POA: Diagnosis not present

## 2016-05-10 MED ORDER — AMIODARONE HCL 400 MG PO TABS: 200.0000 mg | ORAL_TABLET | Freq: Every day | ORAL | 0 refills | Status: DC

## 2016-05-10 NOTE — Progress Notes (Signed)
[name]       [place] [address]             [phone]      David Brandt [facility] Medical Record [id number] Date of Birth: 12-12-1945  Referring: [name], MD Primary Care: [name], MD  Chief Complaint:   POST OP FOLLOW UP 04/02/2016  OPERATIVE REPORT PREOPERATIVE DIAGNOSIS:  Unstable angina. POSTOPERATIVE DIAGNOSIS:  Unstable angina. SURGICAL PROCEDURE:  Coronary artery bypass grafting x5 with the left internal mammary to the left anterior descending coronary artery, intramyocardial position, sequential reversed saphenous vein graft to the first and second diagonal, reversed saphenous vein graft to the obtuse marginal, reversed saphenous vein graft to the distal right coronary artery with stent placement of left femoral arterial line. Right greater saphenous vein harvesting thigh and calf, endoscopically. SURGEON:  [name], MD.  History of Present Illness:     Patient turns office stay in follow-up after recent coronary artery bypass grafting. He developed post-operative atrial fibrillation and was started on amiodarone.  There were were no other postoperative complications and he was discharged 04/11/16. Patient has made good progress since discharge home, notes he feels much better than he did. He's increasing his physical activity appropriately, continue using his C Pap at night. He is unaware of any symptoms of atrial fibrillation, he was discharged home in sinus rhythm.     Past Medical History:  Diagnosis Date  . Abnormal cardiovascular stress test 03/29/2016  . Allergic rhinitis   . Diabetes ([place]) 03/29/2016  . Gout   . Hyperlipidemia   . Hypertension   . Sleep apnea    cpap     History  Smoking Status  . Former Smoker  . Packs/day: 1.00  . Years: 47.00  . Types: Cigarettes  . Quit date: 08/23/2014  Smokeless Tobacco  . Never Used    History  Alcohol Use  . 2.4 oz/week  . 4 Cans of beer per week      Allergies  Allergen Reactions  . Sulfa Antibiotics Hives    Current Outpatient Prescriptions  Medication Sig Dispense Refill  . allopurinol (ZYLOPRIM) 300 MG tablet Take 150 mg by mouth daily.  3  . amiodarone (PACERONE) 400 MG tablet Take 1 tablet (400 mg total) by mouth daily. 30 tablet 1  . ANDROGEL PUMP 20.25 MG/ACT (1.62%) GEL Apply 2 each topically daily.    Marland Kitchen aspirin EC 325 MG EC tablet Take 1 tablet (325 mg total) by mouth daily. 30 tablet 0  . atorvastatin (LIPITOR) 40 MG tablet Take 40 mg by mouth every evening.  3  . Cholecalciferol (VITAMIN D3) 2000 units TABS Take 2,000 Units by mouth daily.    . fluticasone (FLOVENT HFA) 220 MCG/ACT inhaler Inhale 2 puffs into the lungs daily as needed (for shortness of breath).     . hydrocortisone cream 1 % Apply 1 application topically as needed for itching.    . metoprolol tartrate (LOPRESSOR) 25 MG tablet Take 0.5 tablets (12.5 mg total) by mouth 2 (two) times daily. 60 tablet 3  . OVER THE COUNTER MEDICATION Take 1 capsule by mouth 2 (two) times daily. ProbioticXL    . OVER THE COUNTER MEDICATION Take 2 capsules by mouth 2 (two) times daily. OmegaXL    . SitaGLIPtin-MetFORMIN HCl (JANUMET XR) 50-1000 MG TB24 Take 1 tablet by mouth every evening.    . traMADol (ULTRAM) 50 MG tablet Take 1-2 tablets (50-100 mg total)  by mouth every 4 (four) hours as needed for moderate pain. 30 tablet 0  . valsartan-hydrochlorothiazide (DIOVAN-HCT) 320-12.5 MG per tablet Take 1 tablet by mouth daily.       No current facility-administered medications for this visit.        Physical Exam: BP (!) 148/88 (BP Location: Left Arm, Patient Position: Sitting, Cuff Size: Large)   Pulse 71   Resp 20   Ht 5\' 10"  (1.778 m)   Wt 280 lb (127 kg)   SpO2 97% Comment: RA  BMI 40.18 kg/m   General appearance: alert and cooperative Neurologic: intact Heart: regular rate and rhythm, S1, S2 normal, no murmur, click, rub or gallop Lungs: clear to  auscultation bilaterally Abdomen: soft, non-tender; bowel sounds normal; no masses,  no organomegaly Extremities: extremities normal, atraumatic, no cyanosis or edema and Homans sign is negative, no sign of DVT Wound: Incisions are healing well, sternum is stable   Diagnostic Studies & Laboratory data:     Recent Radiology Findings:   Dg Chest 2 View  Result Date: 05/10/2016 CLINICAL DATA:  Recent coronary artery bypass grafting for coronary artery disease EXAM: CHEST  2 VIEW COMPARISON:  April 08, 2016 FINDINGS: There is no edema or consolidation. Lungs are mildly hyperexpanded. The heart size and pulmonary vascularity are normal. No adenopathy. No pneumothorax. No bone lesions. Patient is status post coronary artery bypass grafting. IMPRESSION: Mild hyperexpansion without edema or consolidation. Electronically Signed   By: Lowella Grip III M.D.   On: 05/10/2016 11:42    I have independently reviewed the above radiology studies  and reviewed the findings with the patient.   Recent Lab Findings: Lab Results  Component Value Date   WBC 16.0 (H) 04/11/2016   HGB 9.3 (L) 04/11/2016   HCT 29.6 (L) 04/11/2016   PLT 496 (H) 04/11/2016   GLUCOSE 84 05/02/2016   CHOL 126 03/30/2016   TRIG 296 (H) 03/30/2016   HDL 31 (L) 03/30/2016   LDLCALC 36 03/30/2016   ALT 28 04/01/2016   AST 29 04/01/2016   NA 137 05/02/2016   K 4.5 05/02/2016   CL 102 05/02/2016   CREATININE 1.03 05/02/2016   BUN 11 05/02/2016   CO2 28 05/02/2016   TSH 3.990 04/06/2016   INR 1.51 04/02/2016   HGBA1C 6.3 (H) 03/31/2016      Assessment / Plan:      Overall the patient is making good progress postoperatively I've encouraged him to enroll in cardiac rehabilitation program We decreased his amiodarone dosage to 200 mg once a day, this could be stopped 2-3 months after surgery per cardiology. I plan to see the patient back in 2 months   [name] MD      [address] [place] [address] Office [phone]   Beeper [phone]  05/10/2016 12:48 PM

## 2016-05-10 NOTE — Patient Instructions (Signed)
Decrease Amiodarone to 200 mg per day until stopped by cardiology  Start cardiac rehab  Coronary Artery Bypass Grafting, Care After Refer to this sheet in the next few weeks. These instructions provide you with information on caring for yourself after your procedure. Your health care provider may also give you more specific instructions. Your treatment has been planned according to current medical practices, but problems sometimes occur. Call your health care provider if you have any problems or questions after your procedure. WHAT TO EXPECT AFTER THE PROCEDURE Recovery from surgery will be different for everyone. Some people feel well after 3 or 4 weeks, while for others it takes longer. After your procedure, it is typical to have the following:  Nausea and a lack of appetite.   Constipation.  Weakness and fatigue.   Depression or irritability.   Pain or discomfort at your incision site. HOME CARE INSTRUCTIONS  Take medicines only as directed by your health care provider. Do not stop taking medicines or start any new medicines without first checking with your health care provider.  Take your pulse as directed by your health care provider.  Perform deep breathing as directed by your health care provider. If you were given a device called an incentive spirometer, use it to practice deep breathing several times a day. Support your chest with a pillow or your arms when you take deep breaths or cough.  Keep incision areas clean, dry, and protected. Remove or change any bandages (dressings) only as directed by your health care provider. You may have skin adhesive strips over the incision areas. Do not take the strips off. They will fall off on their own.  Check incision areas daily for any swelling, redness, or drainage.  If incisions were made in your legs, do the following:  Avoid crossing your legs.   Avoid sitting for long periods of time. Change positions every 30 minutes.    Elevate your legs when you are sitting.  Wear compression stockings as directed by your health care provider. These stockings help keep blood clots from forming in your legs.  Take showers once your health care provider approves. Until then, only take sponge baths. Pat incisions dry. Do not rub incisions with a washcloth or towel. Do not take baths, swim, or use a hot tub until your health care provider approves.  Eat foods that are high in fiber, such as raw fruits and vegetables, whole grains, beans, and nuts. Meats should be lean cut. Avoid canned, processed, and fried foods.  Drink enough fluid to keep your urine clear or pale yellow.  Weigh yourself every day. This helps identify if you are retaining fluid that may make your heart and lungs work harder.  Rest and limit activity as directed by your health care provider. You may be instructed to:  Stop any activity at once if you have chest pain, shortness of breath, irregular heartbeats, or dizziness. Get help right away if you have any of these symptoms.  Move around frequently for short periods or take short walks as directed by your health care provider. Increase your activities gradually. You may need physical therapy or cardiac rehabilitation to help strengthen your muscles and build your endurance.  Avoid lifting, pushing, or pulling anything heavier than 10 lb (4.5 kg) for at least 6 weeks after surgery.  Do not drive until your health care provider approves.  Ask your health care provider when you may return to work.  Ask your health care provider when  you may resume sexual activity.  Keep all follow-up visits as directed by your health care provider. This is important. SEEK MEDICAL CARE IF:  You have swelling, redness, increasing pain, or drainage at the site of an incision.  You have a fever.  You have swelling in your ankles or legs.  You have pain in your legs.   You gain 2 or more pounds (0.9 kg) a  day.  You are nauseous or vomit.  You have diarrhea. SEEK IMMEDIATE MEDICAL CARE IF:  You have chest pain that goes to your jaw or arms.  You have shortness of breath.   You have a fast or irregular heartbeat.   You notice a "clicking" in your breastbone (sternum) when you move.   You have numbness or weakness in your arms or legs.  You feel dizzy or light-headed.  MAKE SURE YOU:  Understand these instructions.  Will watch your condition.  Will get help right away if you are not doing well or get worse. This information is not intended to replace advice given to you by your health care provider. Make sure you discuss any questions you have with your health care provider. Document Released: 12/29/2004 Document Revised: 07/02/2014 Document Reviewed: 11/18/2012 Elsevier Interactive Patient Education  2017 [employer].

## 2016-05-11 ENCOUNTER — Other Ambulatory Visit: Payer: Self-pay | Admitting: *Deleted

## 2016-05-11 NOTE — Patient Outreach (Signed)
[facility]) Care Management  05/11/2016  Fernando Poole [birth year] [id number]    RN attempted outreach call today however unsuccessful and RN unable to leave a HIPAA approved voice message. RN already has a home visit arranged for next week. Will combine transition of care information and routine home visit.   Raina Mina, RN Care Management Coordinator [place] Office [phone]

## 2016-05-15 ENCOUNTER — Other Ambulatory Visit: Payer: Self-pay | Admitting: *Deleted

## 2016-05-15 NOTE — Patient Outreach (Signed)
[facility]) Care Management   05/15/2016  David Brandt 10/08/1945 [id number]  David Brandt is an 70 y.o. male  Subjective:  DM: Pt able to recognize when his CBGs are elevated and what to do if acute symptoms occur. Pt more aware of his eating habits and what food items affect his CBGs. Pt indicates he feels better with the increased exercising daily. Pt states he has a very supportive wife who assist when needed. MEDICAL APPOINTMENTS: Pt reports attending all medical appointments with no delays and sufficient transportation. Pt has been attending all rehabilitation appointments and nutritional classes. MEDICATIONS: Pt verified he has been taking all medications with no acute issues and has sufficient supplies with no needed refills.  Pt feels he has been managing his diabetes very well with changes in his exercises and eating habits. Pt receptive to possible discharge next month.   Objective:   Review of Systems  Constitutional: Negative.   HENT: Negative.   Eyes: Negative.   Cardiovascular: Negative.   Gastrointestinal: Negative.   Genitourinary: Negative.   Musculoskeletal: Negative.   Skin: Negative.   Neurological: Negative.   Endo/Heme/Allergies: Negative.   Psychiatric/Behavioral: Negative.     Physical Exam  Constitutional: He is oriented to person, place, and time. He appears well-developed and well-nourished.  HENT:  Right Ear: External ear normal.  Left Ear: External ear normal.  Eyes: EOM are normal.  Neck: Normal range of motion.  Cardiovascular: Normal heart sounds.   Respiratory: Effort normal and breath sounds normal.  GI: Soft. Bowel sounds are normal.  Musculoskeletal: Normal range of motion.  Neurological: He is alert and oriented to person, place, and time.  Skin: Skin is warm and dry.  Psychiatric: He has a normal mood and affect. His behavior is normal. Judgment and thought content normal.    Encounter Medications:    Outpatient Encounter Prescriptions as of 05/15/2016  Medication Sig  . allopurinol (ZYLOPRIM) 300 MG tablet Take 150 mg by mouth daily.  Marland Kitchen amiodarone (PACERONE) 400 MG tablet Take 0.5 tablets (200 mg total) by mouth daily.  . ANDROGEL PUMP 20.25 MG/ACT (1.62%) GEL Apply 2 each topically daily.  Marland Kitchen aspirin EC 325 MG EC tablet Take 1 tablet (325 mg total) by mouth daily.  Marland Kitchen atorvastatin (LIPITOR) 40 MG tablet Take 40 mg by mouth every evening.  . Cholecalciferol (VITAMIN D3) 2000 units TABS Take 2,000 Units by mouth daily.  . fluticasone (FLOVENT HFA) 220 MCG/ACT inhaler Inhale 2 puffs into the lungs daily as needed (for shortness of breath).   . hydrocortisone cream 1 % Apply 1 application topically as needed for itching.  . metoprolol tartrate (LOPRESSOR) 25 MG tablet Take 0.5 tablets (12.5 mg total) by mouth 2 (two) times daily.  Marland Kitchen OVER THE COUNTER MEDICATION Take 1 capsule by mouth 2 (two) times daily. ProbioticXL  . OVER THE COUNTER MEDICATION Take 2 capsules by mouth 2 (two) times daily. OmegaXL  . SitaGLIPtin-MetFORMIN HCl (JANUMET XR) 50-1000 MG TB24 Take 1 tablet by mouth every evening.  . traMADol (ULTRAM) 50 MG tablet Take 1-2 tablets (50-100 mg total) by mouth every 4 (four) hours as needed for moderate pain.  . valsartan-hydrochlorothiazide (DIOVAN-HCT) 320-12.5 MG per tablet Take 1 tablet by mouth daily.     No facility-administered encounter medications on file as of 05/15/2016.     Functional Status:   In your present state of health, do you have any difficulty performing the following activities: 04/13/2016 03/29/2016  Hearing? N N  Vision? N N  Difficulty concentrating or making decisions? N N  Walking or climbing stairs? N N  Dressing or bathing? N N  Doing errands, shopping? N N  Preparing Food and eating ? N -  Using the Toilet? N -  In the past six months, have you accidently leaked urine? N -  Do you have problems with loss of bowel control? N -  Managing your  Medications? N -  Managing your Finances? N -  Housekeeping or managing your Housekeeping? N -  Some recent data might be hidden  BP 110/68 (BP Location: Right Arm, Patient Position: Sitting, Cuff Size: Normal)   Pulse 73   Resp 20   Ht 1.778 m (5\' 10" )   Wt 267 lb 8 oz (121.3 kg)   SpO2 98%   BMI 38.38 kg/m   Fall/Depression Screening:    PHQ 2/9 Scores 04/13/2016  PHQ - 2 Score 0    Assessment:   Will continue case management related to diabetes Will follow up on adherence with medical appointments Will follow up on adherence with medications   Plan: Will completed a physical assessment with no issues or abnormal problems. Will verify pt continues to monitor his diabetes with daily glucose readings. Readings within the last 30 days as noted ranging around 100. Will verify pt continues to attend all medical appointments with no delays. Review all upcoming appointments over the next month related to cardiac rehabilitation and nutritional classes. Will continue to encourage adherence to prevent acute events. Will also verify pt's adherence with taking all his prescribed medications accordingly to his provider's changing his medication (recent change in Amiodarone from 400 mg to 200 mg daily. Plan of care and goal discussed as pt verbalized an understanding of the plan of care. Will strongly encouraged pt to continues managing his care. Will offer another follow up home visit next month. Based upon pt's adherence discussed possible discharge from next home visit in December (pt receptive). Will determine if pt needs ongoing health coach at that time.  No other inquires or request at this time.  Raina Mina, RN Care Management Coordinator [place] Office [phone]

## 2016-05-24 ENCOUNTER — Encounter: Payer: Medicare PPO | Attending: Internal Medicine | Admitting: Dietician

## 2016-05-24 DIAGNOSIS — Z6838 Body mass index (BMI) 38.0-38.9, adult: Secondary | ICD-10-CM | POA: Diagnosis not present

## 2016-05-24 DIAGNOSIS — E118 Type 2 diabetes mellitus with unspecified complications: Secondary | ICD-10-CM | POA: Diagnosis not present

## 2016-05-24 DIAGNOSIS — Z713 Dietary counseling and surveillance: Secondary | ICD-10-CM | POA: Insufficient documentation

## 2016-05-24 DIAGNOSIS — E119 Type 2 diabetes mellitus without complications: Secondary | ICD-10-CM

## 2016-05-24 DIAGNOSIS — I10 Essential (primary) hypertension: Secondary | ICD-10-CM

## 2016-05-24 NOTE — Progress Notes (Signed)
Diabetes Self-Management Education  Visit Type: First/Initial  Appt. Start Time: 1730 Appt. End Time: 1930  05/24/2016  [name], identified by name and date of birth, is a 71 y.o. male with a diagnosis of Diabetes: Type 2.   ASSESSMENT  Height 5\' 10"  (1.778 m), weight 271 lb (122.9 kg). Body mass index is 38.88 kg/m.      Diabetes Self-Management Education - 05/24/16 1930      Visit Information   Visit Type First/Initial     Initial Visit   Diabetes Type Type 2   Are you currently following a meal plan? Yes   What type of meal plan do you follow? heart safe   Are you taking your medications as prescribed? Yes   Date Diagnosed Sept 2017     Health Coping   How would you rate your overall health? Good     Psychosocial Assessment   Patient Belief/Attitude about Diabetes Motivated to manage diabetes   Other persons present Patient;Family Member  son David Brandt   Patient Concerns Nutrition/Meal planning;Healthy Lifestyle;Weight Control   Learning Readiness Change in progress   What is the last grade level you completed in school? [id number]     Complications   How often do you check your blood sugar? 1-2 times/day   Fasting Blood glucose range (mg/dL) 70-129   Number of hypoglycemic episodes per month 0   Number of hyperglycemic episodes per week 0   Have you had a dilated eye exam in the past 12 months? No   Have you had a dental exam in the past 12 months? No   Are you checking your feet? Yes   How many days per week are you checking your feet? 1     Dietary Intake   Breakfast coffee with 2 packets splenda, 2 cups Great Grains cereal with 2 splenda and 1% milk   Snack (morning) peanut butter crackers and an apple   Lunch Vanilla Herbal Life shake with Fiber One cereal and berries with alkaline water   Dinner [employer] frozen diabetes friendly dinner meals (meat with 2 veggies) or salad or [place] sandwich with baked sweet potato   Snack (evening) 2 oatmeal cookies  or an apple   Beverage(s) water, coffee, Sprite Zero 2 times per week, occaionally beer or vodka     Exercise   Exercise Type Light (walking / raking leaves)   How many days per week to you exercise? 5   How many minutes per day do you exercise? 25   Total minutes per week of exercise 125     Patient Education   Previous Diabetes Education No   Disease state  Definition of diabetes, type 1 and 2, and the diagnosis of diabetes;Factors that contribute to the development of diabetes   Nutrition management  Role of diet in the treatment of diabetes and the relationship between the three main macronutrients and blood glucose level;Food label reading, portion sizes and measuring food.;Carbohydrate counting;Reviewed blood glucose goals for pre and post meals and how to evaluate the patients' food intake on their blood glucose level.   Physical activity and exercise  Role of exercise on diabetes management, blood pressure control and cardiac health.   Monitoring Identified appropriate SMBG and/or A1C goals.   Chronic complications Relationship between chronic complications and blood glucose control;Lipid levels, blood glucose control and heart disease   Psychosocial adjustment Role of stress on diabetes     Individualized Goals (developed by patient)   Nutrition Follow  meal plan discussed;General guidelines for healthy choices and portions discussed   Physical Activity Exercise 5-7 days per week;30 minutes per day   Medications take my medication as prescribed   Reducing Risk examine blood glucose patterns     Outcomes   Expected Outcomes Demonstrated interest in learning. Expect positive outcomes   Future DMSE 4-6 wks   Program Status Not Completed      Individualized Plan for Diabetes Self-Management Training:   Learning Objective:  Patient will have a greater understanding of diabetes self-management. Patient education plan is to attend individual and/or group sessions per assessed needs  and concerns.   Plan:    Follow Diabetes Meal Plan as instructed  Eat 3 meals and snacks as needed, every 3-5 hrs  Limit carbohydrate intake to 45-60 grams carbohydrate/meal  Limit carbohydrate intake to 15-30 grams carbohydrate/snack  Add lean protein foods to meals/snacks  Practice mindful eating to help with portion control  Monitor glucose levels as instructed by your doctor  Keep up the great work with exercise, aim for 20-30 mins of physical activity daily  Consider indoor alternative to walking for winter months such as the [facility]  Expected Outcomes:  Demonstrated interest in learning. Expect positive outcomes  Education material provided: Living Well with Diabetes, Meal plan card, My Plate and Snack sheet  If problems or questions, patient to contact team via:  Phone and Email  Future DSME appointment: 4-6 wks

## 2016-05-28 ENCOUNTER — Other Ambulatory Visit: Payer: Self-pay | Admitting: Cardiovascular Disease

## 2016-05-28 DIAGNOSIS — R079 Chest pain, unspecified: Secondary | ICD-10-CM | POA: Diagnosis not present

## 2016-05-28 DIAGNOSIS — E119 Type 2 diabetes mellitus without complications: Secondary | ICD-10-CM | POA: Diagnosis not present

## 2016-05-28 DIAGNOSIS — E291 Testicular hypofunction: Secondary | ICD-10-CM | POA: Diagnosis not present

## 2016-05-29 ENCOUNTER — Encounter (HOSPITAL_COMMUNITY): Payer: Self-pay

## 2016-05-29 ENCOUNTER — Encounter (HOSPITAL_COMMUNITY)
Admission: RE | Admit: 2016-05-29 | Discharge: 2016-05-29 | Disposition: A | Discharge status: Home / Self Care | Payer: BLUE CROSS/BLUE SHIELD | Source: Ambulatory Visit | Attending: Cardiovascular Disease | Admitting: Cardiovascular Disease

## 2016-05-29 VITALS — BP 140/70 | HR 69 | Ht 69.0 in | Wt 277.3 lb

## 2016-05-29 DIAGNOSIS — Z48812 Encounter for surgical aftercare following surgery on the circulatory system: Secondary | ICD-10-CM | POA: Insufficient documentation

## 2016-05-29 DIAGNOSIS — Z9889 Other specified postprocedural states: Secondary | ICD-10-CM | POA: Insufficient documentation

## 2016-05-29 DIAGNOSIS — Z951 Presence of aortocoronary bypass graft: Secondary | ICD-10-CM | POA: Diagnosis not present

## 2016-05-29 NOTE — Progress Notes (Signed)
Cardiac Rehab Medication Review by a Pharmacist  Does the patient  feel that his/her medications are working for him/her?  yes  Has the patient been experiencing any side effects to the medications prescribed?  no  Does the patient measure his/her own blood pressure or blood glucose at home?  yes  CBGs generally in the 90s or 100s in the morning when he wakes up  Does the patient have any problems obtaining medications due to transportation or finances?   no  Understanding of regimen: fair Understanding of indications: good Potential of compliance: good    Pharmacist comments: Takes most medications in the morning   Elenor Quinones, PharmD, BCPS Clinical Pharmacist Pager [phone] 05/29/2016 2:05 PM

## 2016-05-30 NOTE — Progress Notes (Signed)
Cardiac Individual Treatment Plan  Patient Details  Name: David Brandt MRN: [id number] Date of Birth: [birth year] Referring Provider:   Flowsheet Row CARDIAC REHAB PHASE II ORIENTATION from 05/29/2016 in [place]  Referring Provider  Skeet Latch, MD.      Initial Encounter Date:  Flowsheet Row CARDIAC REHAB PHASE II ORIENTATION from 05/29/2016 in [place]  Date  05/29/16  Referring Provider  Skeet Latch, MD.      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  allopurinol (ZYLOPRIM) 300 MG tablet, Take 150 mg by mouth daily., Disp: , Rfl: 3 .  amiodarone (PACERONE) 400 MG tablet, Take 0.5 tablets (200 mg total) by mouth daily., Disp: 30 tablet, Rfl: 0 .  ANDROGEL PUMP 20.25 MG/ACT (1.62%) GEL, Apply 2 each topically daily., Disp: , Rfl:  .  aspirin EC 325 MG EC tablet, Take 1 tablet (325 mg total) by mouth daily., Disp: 30 tablet, Rfl: 0 .  atorvastatin (LIPITOR) 40 MG tablet, Take 40 mg by mouth every evening., Disp: , Rfl: 3 .  Cholecalciferol (VITAMIN D3) 2000 units TABS, Take 2,000 Units by mouth daily., Disp: , Rfl:  .  fluticasone (FLOVENT HFA) 220 MCG/ACT inhaler, Inhale 2 puffs into the lungs daily as needed (for shortness of breath). , Disp: , Rfl:  .  hydrocortisone cream 1 %, Apply 1 application topically as needed for itching., Disp: , Rfl:  .  metoprolol tartrate (LOPRESSOR) 25 MG tablet, Take 0.5 tablets (12.5 mg total) by mouth 2 (two) times daily., Disp: 60 tablet, Rfl: 3 .  OVER THE COUNTER MEDICATION, Take 1 capsule by mouth 2 (two) times daily. ProbioticXL, Disp: , Rfl:  .  OVER THE COUNTER MEDICATION, Take 2 capsules by mouth 2 (two) times daily. OmegaXL, Disp: , Rfl:  .  SitaGLIPtin-MetFORMIN HCl (JANUMET XR) 50-1000 MG TB24, Take 1 tablet by mouth every evening., Disp: , Rfl:  .  valsartan-hydrochlorothiazide  (DIOVAN-HCT) 320-12.5 MG per tablet, Take 1 tablet by mouth daily.  , Disp: , Rfl:   Past Medical History: Past Medical History:  Diagnosis Date  . Abnormal cardiovascular stress test 03/29/2016  . Allergic rhinitis   . Diabetes ([place]) 03/29/2016  . Gout   . Hyperlipidemia   . Hypertension   . Sleep apnea    cpap    Tobacco Use: History  Smoking Status  . Former Smoker  . Packs/day: 1.00  . Years: 47.00  . Types: Cigarettes  . Quit date: 01/24/2016  Smokeless Tobacco  . Never Used    Labs: Recent Review Flowsheet Data    Labs for ITP Cardiac and Pulmonary Rehab Latest Ref Rng & Units 04/03/2016 04/03/2016 04/03/2016 04/03/2016 04/03/2016   Cholestrol 0 - 200 mg/dL - - - - -   LDLCALC 0 - 99 mg/dL - - - - -   HDL >40 mg/dL - - - - -   Trlycerides <150 mg/dL - - - - -   Hemoglobin A1c 4.8 - 5.6 % - - - - -   PHART 7.350 - 7.450 7.442 7.432 7.442 - -   PCO2ART 32.0 - 48.0 mmHg 37.4 35.3 35.5 - -   HCO3 20.0 - 28.0 mmol/L 25.3 23.4 24.1 - -   TCO2 0 - 100 mmol/L 26 24 25  - 25   ACIDBASEDEF 0.0 - 2.0 mmol/L - - - - -   O2SAT % 94.0 93.0  90.0 63.7 -      Capillary Blood Glucose: Lab Results  Component Value Date   GLUCAP 111 (H) 04/11/2016   GLUCAP 119 (H) 04/11/2016   GLUCAP 91 04/10/2016   GLUCAP 127 (H) 04/10/2016   GLUCAP 96 04/10/2016     Exercise Target Goals: Date: 05/29/16  Exercise Program Goal: Individual exercise prescription set with THRR, safety & activity barriers. Participant demonstrates ability to understand and report RPE using BORG scale, to self-measure pulse accurately, and to acknowledge the importance of the exercise prescription.  Exercise Prescription Goal: Starting with aerobic activity 30 plus minutes a day, 3 days per week for initial exercise prescription. Provide home exercise prescription and guidelines that participant acknowledges understanding prior to discharge.  Activity Barriers & Risk Stratification:     Activity Barriers &  Cardiac Risk Stratification - 05/29/16 1452      Activity Barriers & Cardiac Risk Stratification   Activity Barriers None;Deconditioning;Muscular Weakness;Other (comment)   Comments gout in R knee      6 Minute Walk:     6 Minute Walk    Row Name 05/29/16 1638         6 Minute Walk   Phase Initial     Distance 1200 feet     Walk Time 6 minutes     # of Rest Breaks 0     MPH 2.27     METS 1.86     RPE 11     VO2 Peak 6.5     Symptoms No     Resting HR 69 bpm     Resting BP 140/70     Max Ex. HR 87 bpm     Max Ex. BP 122/62     2 Minute Post BP 112/60        Initial Exercise Prescription:     Initial Exercise Prescription - 05/29/16 1600      Date of Initial Exercise RX and Referring Provider   Date 05/29/16   Referring Provider Skeet Latch, MD.     Bike   Level 0.6   Minutes 10   METs 1.91     NuStep   Level 1   Minutes 10   METs 2     Track   Laps 10   Minutes 10   METs 2.74     Prescription Details   Frequency (times per week) 3   Duration Progress to 30 minutes of continuous aerobic without signs/symptoms of physical distress     Intensity   THRR 40-80% of Max Heartrate 61-122   Ratings of Perceived Exertion 11-13   Perceived Dyspnea 0-4     Progression   Progression Continue to progress workloads to maintain intensity without signs/symptoms of physical distress.     Resistance Training   Training Prescription Yes   Weight 2lbs.   Reps 10-12      Perform Capillary Blood Glucose checks as needed.  Exercise Prescription Changes:   Exercise Comments:   Discharge Exercise Prescription (Final Exercise Prescription Changes):   Nutrition:  Target Goals: Understanding of nutrition guidelines, daily intake of sodium 1500mg , cholesterol 200mg , calories 30% from fat and 7% or less from saturated fats, daily to have 5 or more servings of fruits and vegetables.  Biometrics:     Pre Biometrics - 05/29/16 1635      Pre  Biometrics   Height 5\' 9"  (1.753 m)   Weight 277 lb 5.4 oz (125.8 kg)   Waist Circumference 52 inches  Hip Circumference 51.75 inches   Waist to Hip Ratio 1 %   BMI (Calculated) 41   Triceps Skinfold 36 mm   % Body Fat 41.8 %   Grip Strength 52 kg   Flexibility 13 in   Single Leg Stand 6.93 seconds       Nutrition Therapy Plan and Nutrition Goals:   Nutrition Discharge: Nutrition Scores:   Nutrition Goals Re-Evaluation:   Psychosocial: Target Goals: Acknowledge presence or absence of depression, maximize coping skills, provide positive support system. Participant is able to verbalize types and ability to use techniques and skills needed for reducing stress and depression.  Initial Review & Psychosocial Screening:     Initial Psych Review & Screening - 05/30/16 0906      Family Dynamics   Good Support System? Yes     Barriers   Psychosocial barriers to participate in program There are no identifiable barriers or psychosocial needs.     Screening Interventions   Interventions Encouraged to exercise      Quality of Life Scores:     Quality of Life - 05/29/16 1649      Quality of Life Scores   Health/Function Pre 28.8 %   Socioeconomic Pre 29 %   Psych/Spiritual Pre 29.14 %   Family Pre 26.4 %   GLOBAL Pre 28.55 %      PHQ-9: Recent Review Flowsheet Data    Depression screen PHQ 2/9 04/13/2016   Decreased Interest 0   Down, Depressed, Hopeless 0   PHQ - 2 Score 0      Psychosocial Evaluation and Intervention:   Psychosocial Re-Evaluation:   Vocational Rehabilitation: Provide vocational rehab assistance to qualifying candidates.   Vocational Rehab Evaluation & Intervention:     Vocational Rehab - 05/30/16 0907      Initial Vocational Rehab Evaluation & Intervention   Assessment shows need for Vocational Rehabilitation No      Education: Education Goals: Education classes will be provided on a weekly basis, covering required topics.  Participant will state understanding/return demonstration of topics presented.  Learning Barriers/Preferences:     Learning Barriers/Preferences - 05/29/16 1405      Learning Barriers/Preferences   Learning Barriers Sight   Learning Preferences Written Material      Education Topics: Count Your Pulse:  -Group instruction provided by verbal instruction, demonstration, patient participation and written materials to support subject.  Instructors address importance of being able to find your pulse and how to count your pulse when at home without a heart monitor.  Patients get hands on experience counting their pulse with staff help and individually.   Heart Attack, Angina, and Risk Factor Modification:  -Group instruction provided by verbal instruction, video, and written materials to support subject.  Instructors address signs and symptoms of angina and heart attacks.    Also discuss risk factors for heart disease and how to make changes to improve heart health risk factors.   Functional Fitness:  -Group instruction provided by verbal instruction, demonstration, patient participation, and written materials to support subject.  Instructors address safety measures for doing things around the house.  Discuss how to get up and down off the floor, how to pick things up properly, how to safely get out of a chair without assistance, and balance training.   Meditation and Mindfulness:  -Group instruction provided by verbal instruction, patient participation, and written materials to support subject.  Instructor addresses importance of mindfulness and meditation practice to help reduce stress and improve  awareness.  Instructor also leads participants through a meditation exercise.    Stretching for Flexibility and Mobility:  -Group instruction provided by verbal instruction, patient participation, and written materials to support subject.  Instructors lead participants through series of stretches  that are designed to increase flexibility thus improving mobility.  These stretches are additional exercise for major muscle groups that are typically performed during regular warm up and cool down.   Hands Only CPR Anytime:  -Group instruction provided by verbal instruction, video, patient participation and written materials to support subject.  Instructors co-teach with AHA video for hands only CPR.  Participants get hands on experience with mannequins.   Nutrition I class: Heart Healthy Eating:  -Group instruction provided by PowerPoint slides, verbal discussion, and written materials to support subject matter. The instructor gives an explanation and review of the Therapeutic Lifestyle Changes diet recommendations, which includes a discussion on lipid goals, dietary fat, sodium, fiber, plant stanol/sterol esters, sugar, and the components of a well-balanced, healthy diet.   Nutrition II class: Lifestyle Skills:  -Group instruction provided by PowerPoint slides, verbal discussion, and written materials to support subject matter. The instructor gives an explanation and review of label reading, grocery shopping for heart health, heart healthy recipe modifications, and ways to make healthier choices when eating out.   Diabetes Question & Answer:  -Group instruction provided by PowerPoint slides, verbal discussion, and written materials to support subject matter. The instructor gives an explanation and review of diabetes co-morbidities, pre- and post-prandial blood glucose goals, pre-exercise blood glucose goals, signs, symptoms, and treatment of hypoglycemia and hyperglycemia, and foot care basics.   Diabetes Blitz:  -Group instruction provided by PowerPoint slides, verbal discussion, and written materials to support subject matter. The instructor gives an explanation and review of the physiology behind type 1 and type 2 diabetes, diabetes medications and rational behind using different  medications, pre- and post-prandial blood glucose recommendations and Hemoglobin A1c goals, diabetes diet, and exercise including blood glucose guidelines for exercising safely.    Portion Distortion:  -Group instruction provided by PowerPoint slides, verbal discussion, written materials, and food models to support subject matter. The instructor gives an explanation of serving size versus portion size, changes in portions sizes over the last 20 years, and what consists of a serving from each food group.   Stress Management:  -Group instruction provided by verbal instruction, video, and written materials to support subject matter.  Instructors review role of stress in heart disease and how to cope with stress positively.     Exercising on Your Own:  -Group instruction provided by verbal instruction, power point, and written materials to support subject.  Instructors discuss benefits of exercise, components of exercise, frequency and intensity of exercise, and end points for exercise.  Also discuss use of nitroglycerin and activating EMS.  Review options of places to exercise outside of rehab.  Review guidelines for sex with heart disease.   Cardiac Drugs I:  -Group instruction provided by verbal instruction and written materials to support subject.  Instructor reviews cardiac drug classes: antiplatelets, anticoagulants, beta blockers, and statins.  Instructor discusses reasons, side effects, and lifestyle considerations for each drug class.   Cardiac Drugs II:  -Group instruction provided by verbal instruction and written materials to support subject.  Instructor reviews cardiac drug classes: angiotensin converting enzyme inhibitors (ACE-I), angiotensin II receptor blockers (ARBs), nitrates, and calcium channel blockers.  Instructor discusses reasons, side effects, and lifestyle considerations for each drug class.   Anatomy  and Physiology of the Circulatory System:  -Group instruction provided  by verbal instruction, video, and written materials to support subject.  Reviews functional anatomy of heart, how it relates to various diagnoses, and what role the heart plays in the overall system.   Knowledge Questionnaire Score:     Knowledge Questionnaire Score - 05/29/16 1647      Knowledge Questionnaire Score   Pre Score 16/24      Core Components/Risk Factors/Patient Goals at Admission:     Personal Goals and Risk Factors at Admission - 05/29/16 1453      Core Components/Risk Factors/Patient Goals on Admission   Personal Goal Other Yes   Personal Goal Learn exercise limitations and know exactly what to do at the gym   Intervention Provide exercise programming and guidelines to assist with building confidence and exercise tolerance to return to gym [facility])   Expected Outcomes Pt will return to [facility] with confidence and know how to use machines correctly.      Core Components/Risk Factors/Patient Goals Review:    Core Components/Risk Factors/Patient Goals at Discharge (Final Review):    ITP Comments:     ITP Comments    Row Name 05/29/16 1325           ITP Comments Medical Director- Dr. Fransico Him, MD.          Comments: Patient attended orientation from 1300 to 1530 to review rules and guidelines for program. Completed 6 minute walk test, Intitial ITP, and exercise prescription.  VSS. Telemetry-Sinus Rhtyhm.  Asymptomatic.Barnet Pall, RN,BSN 05/30/2016 9:12 AM

## 2016-05-31 ENCOUNTER — Ambulatory Visit: Payer: Medicare PPO

## 2016-06-04 ENCOUNTER — Encounter (HOSPITAL_COMMUNITY): Payer: BLUE CROSS/BLUE SHIELD

## 2016-06-06 ENCOUNTER — Encounter (HOSPITAL_COMMUNITY)
Admission: RE | Admit: 2016-06-06 | Discharge: 2016-06-06 | Disposition: A | Discharge status: Home / Self Care | Payer: BLUE CROSS/BLUE SHIELD | Source: Ambulatory Visit | Attending: Cardiovascular Disease | Admitting: Cardiovascular Disease

## 2016-06-06 ENCOUNTER — Encounter (HOSPITAL_COMMUNITY): Payer: Self-pay

## 2016-06-06 DIAGNOSIS — Z9889 Other specified postprocedural states: Secondary | ICD-10-CM | POA: Diagnosis not present

## 2016-06-06 DIAGNOSIS — Z951 Presence of aortocoronary bypass graft: Secondary | ICD-10-CM | POA: Diagnosis not present

## 2016-06-06 DIAGNOSIS — Z48812 Encounter for surgical aftercare following surgery on the circulatory system: Secondary | ICD-10-CM | POA: Diagnosis not present

## 2016-06-06 LAB — GLUCOSE, CAPILLARY
GLUCOSE-CAPILLARY: 115 mg/dL — AB (ref 65–99)
Glucose-Capillary: 95 mg/dL (ref 65–99)

## 2016-06-06 NOTE — Progress Notes (Signed)
Daily Session Note  Patient Details  Name: David Brandt MRN: [id number] Date of Birth: 06/10/1946 Referring Provider:   Flowsheet Row CARDIAC REHAB PHASE II ORIENTATION from 05/29/2016 in [place]  Referring Provider  Skeet Latch, MD.      Encounter Date: 06/06/2016  Check In:   Capillary Blood Glucose: No results found for this or any previous visit (from the past 24 hour(s)).   Goals Met:  Exercise tolerated well  Goals Unmet:  Not Applicable  Comments: Pt started cardiac rehab today.  Pt tolerated light exercise without difficulty. VSS, telemetry-sinus rhythm,  asymptomatic.  Medication list reconciled. Pt denies barriers to medicaiton compliance.  PSYCHOSOCIAL ASSESSMENT:  PHQ-0. Pt exhibits positive coping skills, hopeful outlook with supportive family. No psychosocial needs identified at this time, no psychosocial interventions necessary.    Pt enjoys fishing and spending time with people.  Pt goals for cardiac rehab are to improve his health.   Pt oriented to exercise equipment and routine.    Understanding verbalized.   Dr. Fransico Him is Medical Director for Cardiac Rehab at [facility].

## 2016-06-07 ENCOUNTER — Ambulatory Visit: Payer: Medicare PPO

## 2016-06-08 ENCOUNTER — Encounter (HOSPITAL_COMMUNITY)
Admission: RE | Admit: 2016-06-08 | Discharge: 2016-06-08 | Disposition: A | Discharge status: Home / Self Care | Payer: BLUE CROSS/BLUE SHIELD | Source: Ambulatory Visit | Attending: Cardiovascular Disease | Admitting: Cardiovascular Disease

## 2016-06-08 ENCOUNTER — Other Ambulatory Visit: Payer: Self-pay | Admitting: Physician Assistant

## 2016-06-08 ENCOUNTER — Encounter (HOSPITAL_COMMUNITY): Admission: RE | Admit: 2016-06-08 | Payer: BLUE CROSS/BLUE SHIELD | Source: Ambulatory Visit

## 2016-06-08 DIAGNOSIS — Z951 Presence of aortocoronary bypass graft: Secondary | ICD-10-CM

## 2016-06-08 DIAGNOSIS — Z48812 Encounter for surgical aftercare following surgery on the circulatory system: Secondary | ICD-10-CM | POA: Diagnosis not present

## 2016-06-08 DIAGNOSIS — Z9889 Other specified postprocedural states: Secondary | ICD-10-CM | POA: Diagnosis not present

## 2016-06-08 LAB — GLUCOSE, CAPILLARY
GLUCOSE-CAPILLARY: 157 mg/dL — AB (ref 65–99)
GLUCOSE-CAPILLARY: 118 mg/dL — AB (ref 65–99)

## 2016-06-11 ENCOUNTER — Encounter (HOSPITAL_COMMUNITY): Admission: RE | Admit: 2016-06-11 | Payer: BLUE CROSS/BLUE SHIELD | Source: Ambulatory Visit

## 2016-06-13 ENCOUNTER — Encounter (HOSPITAL_COMMUNITY)
Admission: RE | Admit: 2016-06-13 | Discharge: 2016-06-13 | Disposition: A | Discharge status: Home / Self Care | Payer: BLUE CROSS/BLUE SHIELD | Source: Ambulatory Visit | Attending: Cardiovascular Disease | Admitting: Cardiovascular Disease

## 2016-06-13 DIAGNOSIS — Z951 Presence of aortocoronary bypass graft: Secondary | ICD-10-CM

## 2016-06-13 DIAGNOSIS — Z48812 Encounter for surgical aftercare following surgery on the circulatory system: Secondary | ICD-10-CM | POA: Diagnosis not present

## 2016-06-13 DIAGNOSIS — Z9889 Other specified postprocedural states: Secondary | ICD-10-CM | POA: Diagnosis not present

## 2016-06-13 LAB — GLUCOSE, CAPILLARY
Glucose-Capillary: 104 mg/dL — ABNORMAL HIGH (ref 65–99)
Glucose-Capillary: 116 mg/dL — ABNORMAL HIGH (ref 65–99)

## 2016-06-13 NOTE — Progress Notes (Signed)
Reviewed Home Exercise Program with pt.  Discussed mode/frequency/duration of exercise, THRR, RPE and weather conditions for exercising outdoors.  Also discussed signs and symptoms and when to call Dr./911.  Pt verbalized understanding.  Cleda Mccreedy, MS 06/13/2016 11:21

## 2016-06-14 ENCOUNTER — Other Ambulatory Visit: Payer: Self-pay | Admitting: *Deleted

## 2016-06-14 NOTE — Patient Outreach (Signed)
David Brandt) Care Management   06/14/2016  David Brandt 1946/06/17 [id number]  David Brandt is an 70 y.o. male  Subjective:  HTN: Pt reports ongoing exercises with cardiac rehabilitation and walks down his driveways. Pt states his BS have improved with the lowest and highest read over the past week from 95-126 and today's BS was 109. Pt also reports he is aware of what affects his up and down weights when he gained several pounds over a few days after eating paste. Pt is aware to limit this food source due to the weight he gains as a result. Pt states he is glade that he is able to recognize what is healthy and unhealthy in his dietary habits related to regulating his BS.  MEDICATIONS: Pt continues to report adherence with his daily medications with no needed refills or delays. APPOINTMENTS: Pt also states he is attending all medical appointments with no missed appointments including weekly cardiac rehabilitation. States he is making progress and feels "better". Overall pt indicates he is doing well with managing his ongoing care concerning his DM and HTN with health dietary habits and increase in his activities. Pt has indicated some ongoing congestion with a productive cough but denies any discoloration at this time with no fevers or flu-like symptoms. Pt states he will get some OTC medications and check with the local pharmacy prior to taking any OTC medication related to his symptoms.  Objective:   Review of Systems  Constitutional: Negative.   HENT: Positive for congestion.   Eyes: Negative.   Respiratory: Negative.   Cardiovascular: Negative.   Gastrointestinal: Negative.   Genitourinary: Negative.   Musculoskeletal: Negative.   Skin: Negative.   Neurological: Negative.   Endo/Heme/Allergies: Negative.   Psychiatric/Behavioral: Negative.     Physical Exam  Constitutional: He is oriented to person, place, and time. He appears well-developed and  well-nourished.  HENT:  Right Ear: External ear normal.  Left Ear: External ear normal.  Eyes: EOM are normal.  Neck: Normal range of motion.  Respiratory: Effort normal and breath sounds normal.  GI: Soft. Bowel sounds are normal.  Musculoskeletal: Normal range of motion.  Neurological: He is alert and oriented to person, place, and time.  Skin: Skin is warm and dry.  Psychiatric: He has a normal mood and affect. His behavior is normal. Judgment and thought content normal.    Encounter Medications:   Outpatient Encounter Prescriptions as of 06/14/2016  Medication Sig  . allopurinol (ZYLOPRIM) 300 MG tablet Take 150 mg by mouth daily.  Marland Kitchen amiodarone (PACERONE) 400 MG tablet Take 0.5 tablets (200 mg total) by mouth daily.  . ANDROGEL PUMP 20.25 MG/ACT (1.62%) GEL Apply 2 each topically daily.  Marland Kitchen aspirin EC 325 MG EC tablet Take 1 tablet (325 mg total) by mouth daily.  Marland Kitchen atorvastatin (LIPITOR) 40 MG tablet Take 40 mg by mouth every evening.  . Cholecalciferol (VITAMIN D3) 2000 units TABS Take 2,000 Units by mouth daily.  . fluticasone (FLOVENT HFA) 220 MCG/ACT inhaler Inhale 2 puffs into the lungs daily as needed (for shortness of breath).   . hydrocortisone cream 1 % Apply 1 application topically as needed for itching.  . metoprolol tartrate (LOPRESSOR) 25 MG tablet Take 0.5 tablets (12.5 mg total) by mouth 2 (two) times daily.  Marland Kitchen OVER THE COUNTER MEDICATION Take 1 capsule by mouth 2 (two) times daily. ProbioticXL  . OVER THE COUNTER MEDICATION Take 2 capsules by mouth 2 (two) times daily. OmegaXL  . SitaGLIPtin-MetFORMIN  HCl (JANUMET XR) 50-1000 MG TB24 Take 1 tablet by mouth every evening.  . valsartan-hydrochlorothiazide (DIOVAN-HCT) 320-12.5 MG per tablet Take 1 tablet by mouth daily.     No facility-administered encounter medications on file as of 06/14/2016.     Functional Status:   In your present state of health, do you have any difficulty performing the following activities:  04/13/2016 03/29/2016  Hearing? N N  Vision? N N  Difficulty concentrating or making decisions? N N  Walking or climbing stairs? N N  Dressing or bathing? N N  Doing errands, shopping? N N  Preparing Food and eating ? N -  Using the Toilet? N -  In the past six months, have you accidently leaked urine? N -  Do you have problems with loss of bowel control? N -  Managing your Medications? N -  Managing your Finances? N -  Housekeeping or managing your Housekeeping? N -  Some recent data might be hidden    Fall/Depression Screening:    PHQ 2/9 Scores 06/06/2016 04/13/2016  PHQ - 2 Score 0 0    Assessment:  Ongoing case management related diabetes Follow up on adherence with medical appointments Potential infection related productive cough   Plan:  Will verify pt continues to manage his ongoing diabetes with daily glucometer checks. Will verify pt continues to manage his diabetes with daily BS and aware of what to do if acute readings or symptoms should occur. Praise pt for his adherence with documenting all his readings in the [facility] calendar and utilizing the resources in the calendar in his pursue for a healthier lifestyle.  Will verify pt continues to exercises with his daily walks down his driveway and cardiac rehabilitation.  Will verify pt remains adherent with taking his medications and attending all her scheduled appointments.  Will discussed possible OTC medications to assist with pt's ongoing productive cough and congestion however will strongly encourage pt to verify clearance on any OTC medication due to his HTN with his medical provider or local pharmacy prior to consumption. Plan of care reviewed and discussed along with goals. Will follow up next month due to issues with a productive cough, congestion and one addition goal to evaluate at this time prior to possible discharge.  Raina Mina, RN Care Management Coordinator [place] Office  [phone]

## 2016-06-15 ENCOUNTER — Encounter (HOSPITAL_COMMUNITY)
Admission: RE | Admit: 2016-06-15 | Discharge: 2016-06-15 | Disposition: A | Discharge status: Home / Self Care | Payer: BLUE CROSS/BLUE SHIELD | Source: Ambulatory Visit | Attending: Cardiovascular Disease | Admitting: Cardiovascular Disease

## 2016-06-15 DIAGNOSIS — Z9889 Other specified postprocedural states: Secondary | ICD-10-CM | POA: Diagnosis not present

## 2016-06-15 DIAGNOSIS — Z951 Presence of aortocoronary bypass graft: Secondary | ICD-10-CM

## 2016-06-15 DIAGNOSIS — Z48812 Encounter for surgical aftercare following surgery on the circulatory system: Secondary | ICD-10-CM | POA: Diagnosis not present

## 2016-06-15 LAB — GLUCOSE, CAPILLARY: GLUCOSE-CAPILLARY: 156 mg/dL — AB (ref 65–99)

## 2016-06-20 ENCOUNTER — Encounter (HOSPITAL_COMMUNITY)
Admission: RE | Admit: 2016-06-20 | Discharge: 2016-06-20 | Disposition: A | Discharge status: Home / Self Care | Payer: BLUE CROSS/BLUE SHIELD | Source: Ambulatory Visit | Attending: Cardiovascular Disease | Admitting: Cardiovascular Disease

## 2016-06-20 VITALS — Wt 278.7 lb

## 2016-06-20 DIAGNOSIS — Z951 Presence of aortocoronary bypass graft: Secondary | ICD-10-CM | POA: Diagnosis not present

## 2016-06-20 DIAGNOSIS — Z9889 Other specified postprocedural states: Secondary | ICD-10-CM | POA: Diagnosis not present

## 2016-06-20 DIAGNOSIS — Z48812 Encounter for surgical aftercare following surgery on the circulatory system: Secondary | ICD-10-CM | POA: Diagnosis not present

## 2016-06-20 LAB — GLUCOSE, CAPILLARY: Glucose-Capillary: 125 mg/dL — ABNORMAL HIGH (ref 65–99)

## 2016-06-21 ENCOUNTER — Encounter: Payer: Medicare PPO | Attending: Internal Medicine | Admitting: Dietician

## 2016-06-21 ENCOUNTER — Encounter: Payer: Self-pay | Admitting: Dietician

## 2016-06-21 DIAGNOSIS — Z713 Dietary counseling and surveillance: Secondary | ICD-10-CM | POA: Insufficient documentation

## 2016-06-21 DIAGNOSIS — E118 Type 2 diabetes mellitus with unspecified complications: Secondary | ICD-10-CM | POA: Diagnosis not present

## 2016-06-21 DIAGNOSIS — I10 Essential (primary) hypertension: Secondary | ICD-10-CM

## 2016-06-21 DIAGNOSIS — E119 Type 2 diabetes mellitus without complications: Secondary | ICD-10-CM

## 2016-06-21 DIAGNOSIS — Z6838 Body mass index (BMI) 38.0-38.9, adult: Secondary | ICD-10-CM | POA: Diagnosis not present

## 2016-06-21 NOTE — Progress Notes (Signed)
Visit Info: Follow-Up for Type 2 Diabetes.  Start time: 1500   End time: 73  Follow-up for Type 2 DM and cardiovascular health.  Patient is here today with his son who also has diabetes.  Patient has been attending Cardiac Rehab classes and really enjoying them and has noticed his endurance and stamina has improved.  He has seen the effect of exercise on his blood sugar and learned safety precautions to avoid low blood sugar following exercise.  He states that his portion control has gone very well and he is reading Nutrition Labels all the time.  He was able to teach back concepts discussed at our last visit including recommended carbohydrate and sodium intake.  He has an exercise plan in place to continue after cardiac rehab.  Him and his wife and son plan to go to the [employer] at least 3-4 days per week.  He has good nutrition questions for me today.  He has "gained" some weight, likely due to heavy clothing on for cold weather, at home he is weighing daily and weight has been maintained at 292 lbs since our last visit.  Denies water retention or fluctuations to weight at home on his scale, weighing first thing in the morning every day.  Checking fasting blood sugar 1x/day, ranging from 92-114 mg/dL over the last week.  Has also been working with a dietitian in cardiac rehab and learning more about food choices and heart health.  Goals: Physical Activity: 3-4 days a week at the [facility] for 60 min or more Continue reading nutrition labels to make informed decisions on grocery store purchases and restaurant foods  M/E: Follow-up prn.

## 2016-06-22 ENCOUNTER — Encounter (HOSPITAL_COMMUNITY)
Admission: RE | Admit: 2016-06-22 | Discharge: 2016-06-22 | Disposition: A | Discharge status: Home / Self Care | Payer: BLUE CROSS/BLUE SHIELD | Source: Ambulatory Visit | Attending: Cardiovascular Disease | Admitting: Cardiovascular Disease

## 2016-06-22 DIAGNOSIS — Z951 Presence of aortocoronary bypass graft: Secondary | ICD-10-CM

## 2016-06-22 DIAGNOSIS — Z48812 Encounter for surgical aftercare following surgery on the circulatory system: Secondary | ICD-10-CM | POA: Diagnosis not present

## 2016-06-22 DIAGNOSIS — Z9889 Other specified postprocedural states: Secondary | ICD-10-CM | POA: Diagnosis not present

## 2016-06-22 LAB — GLUCOSE, CAPILLARY: Glucose-Capillary: 199 mg/dL — ABNORMAL HIGH (ref 65–99)

## 2016-06-26 NOTE — Progress Notes (Signed)
Cardiac Individual Treatment Plan  Patient Details  Name: David Brandt MRN: [id number] Date of Birth: 1946/01/30 Referring Provider:   Flowsheet Row CARDIAC REHAB PHASE II ORIENTATION from 05/29/2016 in [place]  Referring Provider  Skeet Latch, MD.      Initial Encounter Date:  Flowsheet Row CARDIAC REHAB PHASE II ORIENTATION from 05/29/2016 in [place]  Date  05/29/16  Referring Provider  Skeet Latch, MD.      Visit Diagnosis: s/p CABG x 5  Patient's Home Medications on Admission:  Current Outpatient Prescriptions:  .  allopurinol (ZYLOPRIM) 300 MG tablet, Take 150 mg by mouth daily., Disp: , Rfl: 3 .  amiodarone (PACERONE) 400 MG tablet, Take 0.5 tablets (200 mg total) by mouth daily., Disp: 30 tablet, Rfl: 0 .  ANDROGEL PUMP 20.25 MG/ACT (1.62%) GEL, Apply 2 each topically daily., Disp: , Rfl:  .  aspirin EC 325 MG EC tablet, Take 1 tablet (325 mg total) by mouth daily., Disp: 30 tablet, Rfl: 0 .  atorvastatin (LIPITOR) 40 MG tablet, Take 40 mg by mouth every evening., Disp: , Rfl: 3 .  Cholecalciferol (VITAMIN D3) 2000 units TABS, Take 2,000 Units by mouth daily., Disp: , Rfl:  .  fluticasone (FLOVENT HFA) 220 MCG/ACT inhaler, Inhale 2 puffs into the lungs daily as needed (for shortness of breath). , Disp: , Rfl:  .  hydrocortisone cream 1 %, Apply 1 application topically as needed for itching., Disp: , Rfl:  .  metoprolol tartrate (LOPRESSOR) 25 MG tablet, Take 0.5 tablets (12.5 mg total) by mouth 2 (two) times daily., Disp: 60 tablet, Rfl: 3 .  OVER THE COUNTER MEDICATION, Take 1 capsule by mouth 2 (two) times daily. ProbioticXL, Disp: , Rfl:  .  OVER THE COUNTER MEDICATION, Take 2 capsules by mouth 2 (two) times daily. OmegaXL, Disp: , Rfl:  .  SitaGLIPtin-MetFORMIN HCl (JANUMET XR) 50-1000 MG TB24, Take 1 tablet by mouth every evening., Disp: , Rfl:  .  valsartan-hydrochlorothiazide  (DIOVAN-HCT) 320-12.5 MG per tablet, Take 1 tablet by mouth daily.  , Disp: , Rfl:   Past Medical History: Past Medical History:  Diagnosis Date  . Abnormal cardiovascular stress test 03/29/2016  . Allergic rhinitis   . Diabetes ([place]) 03/29/2016  . Gout   . Hyperlipidemia   . Hypertension   . Sleep apnea    cpap    Tobacco Use: History  Smoking Status  . Former Smoker  . Packs/day: 1.00  . Years: 47.00  . Types: Cigarettes  . Quit date: 01/24/2016  Smokeless Tobacco  . Never Used    Labs: Recent Review Flowsheet Data    Labs for ITP Cardiac and Pulmonary Rehab Latest Ref Rng & Units 04/03/2016 04/03/2016 04/03/2016 04/03/2016 04/03/2016   Cholestrol 0 - 200 mg/dL - - - - -   LDLCALC 0 - 99 mg/dL - - - - -   HDL >40 mg/dL - - - - -   Trlycerides <150 mg/dL - - - - -   Hemoglobin A1c 4.8 - 5.6 % - - - - -   PHART 7.350 - 7.450 7.442 7.432 7.442 - -   PCO2ART 32.0 - 48.0 mmHg 37.4 35.3 35.5 - -   HCO3 20.0 - 28.0 mmol/L 25.3 23.4 24.1 - -   TCO2 0 - 100 mmol/L 26 24 25  - 25   ACIDBASEDEF 0.0 - 2.0 mmol/L - - - - -   O2SAT % 94.0 93.0 90.0  63.7 -      Capillary Blood Glucose: Lab Results  Component Value Date   GLUCAP 199 (H) 06/22/2016   GLUCAP 125 (H) 06/20/2016   GLUCAP 156 (H) 06/15/2016   GLUCAP 116 (H) 06/13/2016   GLUCAP 104 (H) 06/13/2016     Exercise Target Goals:    Exercise Program Goal: Individual exercise prescription set with THRR, safety & activity barriers. Participant demonstrates ability to understand and report RPE using BORG scale, to self-measure pulse accurately, and to acknowledge the importance of the exercise prescription.  Exercise Prescription Goal: Starting with aerobic activity 30 plus minutes a day, 3 days per week for initial exercise prescription. Provide home exercise prescription and guidelines that participant acknowledges understanding prior to discharge.  Activity Barriers & Risk Stratification:     Activity Barriers &  Cardiac Risk Stratification - 05/29/16 1452      Activity Barriers & Cardiac Risk Stratification   Activity Barriers None;Deconditioning;Muscular Weakness;Other (comment)   Comments gout in R knee      6 Minute Walk:     6 Minute Walk    Row Name 05/29/16 1638         6 Minute Walk   Phase Initial     Distance 1200 feet     Walk Time 6 minutes     # of Rest Breaks 0     MPH 2.27     METS 1.86     RPE 11     VO2 Peak 6.5     Symptoms No     Resting HR 69 bpm     Resting BP 140/70     Max Ex. HR 87 bpm     Max Ex. BP 122/62     2 Minute Post BP 112/60        Initial Exercise Prescription:     Initial Exercise Prescription - 05/29/16 1600      Date of Initial Exercise RX and Referring Provider   Date 05/29/16   Referring Provider Skeet Latch, MD.     Bike   Level 0.6   Minutes 10   METs 1.91     NuStep   Level 1   Minutes 10   METs 2     Track   Laps 10   Minutes 10   METs 2.74     Prescription Details   Frequency (times per week) 3   Duration Progress to 30 minutes of continuous aerobic without signs/symptoms of physical distress     Intensity   THRR 40-80% of Max Heartrate 61-122   Ratings of Perceived Exertion 11-13   Perceived Dyspnea 0-4     Progression   Progression Continue to progress workloads to maintain intensity without signs/symptoms of physical distress.     Resistance Training   Training Prescription Yes   Weight 2lbs.   Reps 10-12      Perform Capillary Blood Glucose checks as needed.  Exercise Prescription Changes:      Exercise Prescription Changes    Row Name 06/27/16 1100             Exercise Review   Progression Yes         Response to Exercise   Blood Pressure (Admit) 140/80       Blood Pressure (Exercise) 140/84       Blood Pressure (Exit) 142/80       Heart Rate (Admit) 63 bpm       Heart Rate (Exercise) 96 bpm  Heart Rate (Exit) 68 bpm       Rating of Perceived Exertion (Exercise) 13        Duration Progress to 45 minutes of aerobic exercise without signs/symptoms of physical distress       Intensity THRR unchanged         Progression   Progression Continue to progress workloads to maintain intensity without signs/symptoms of physical distress.       Average METs 2.5         Resistance Training   Training Prescription Yes       Weight 3lbs       Reps 10-12         Bike   Level 1.8       Minutes 10       METs 2.2         NuStep   Level 3       Minutes 10       METs 2.1         Track   Laps 10       Minutes 10       METs 2.74         Home Exercise Plan   Plans to continue exercise at Home       Frequency Add 2 additional days to program exercise sessions.          Exercise Comments:      Exercise Comments    Row Name 06/27/16 1133           Exercise Comments Reviewed METs and goals with pt.  Pt is doing well with exercise           Discharge Exercise Prescription (Final Exercise Prescription Changes):     Exercise Prescription Changes - 06/27/16 1100      Exercise Review   Progression Yes     Response to Exercise   Blood Pressure (Admit) 140/80   Blood Pressure (Exercise) 140/84   Blood Pressure (Exit) 142/80   Heart Rate (Admit) 63 bpm   Heart Rate (Exercise) 96 bpm   Heart Rate (Exit) 68 bpm   Rating of Perceived Exertion (Exercise) 13   Duration Progress to 45 minutes of aerobic exercise without signs/symptoms of physical distress   Intensity THRR unchanged     Progression   Progression Continue to progress workloads to maintain intensity without signs/symptoms of physical distress.   Average METs 2.5     Resistance Training   Training Prescription Yes   Weight 3lbs   Reps 10-12     Bike   Level 1.8   Minutes 10   METs 2.2     NuStep   Level 3   Minutes 10   METs 2.1     Track   Laps 10   Minutes 10   METs 2.74     Home Exercise Plan   Plans to continue exercise at Home   Frequency Add 2 additional days to  program exercise sessions.      Nutrition:  Target Goals: Understanding of nutrition guidelines, daily intake of sodium 1500mg , cholesterol 200mg , calories 30% from fat and 7% or less from saturated fats, daily to have 5 or more servings of fruits and vegetables.  Biometrics:     Pre Biometrics - 05/29/16 1635      Pre Biometrics   Height 5\' 9"  (1.753 m)   Weight 277 lb 5.4 oz (125.8 kg)   Waist Circumference 52 inches   Hip  Circumference 51.75 inches   Waist to Hip Ratio 1 %   BMI (Calculated) 41   Triceps Skinfold 36 mm   % Body Fat 41.8 %   Grip Strength 52 kg   Flexibility 13 in   Single Leg Stand 6.93 seconds       Nutrition Therapy Plan and Nutrition Goals:   Nutrition Discharge: Nutrition Scores:   Nutrition Goals Re-Evaluation:   Psychosocial: Target Goals: Acknowledge presence or absence of depression, maximize coping skills, provide positive support system. Participant is able to verbalize types and ability to use techniques and skills needed for reducing stress and depression.  Initial Review & Psychosocial Screening:     Initial Psych Review & Screening - 05/30/16 0906      Family Dynamics   Good Support System? Yes     Barriers   Psychosocial barriers to participate in program There are no identifiable barriers or psychosocial needs.     Screening Interventions   Interventions Encouraged to exercise      Quality of Life Scores:     Quality of Life - 05/29/16 1649      Quality of Life Scores   Health/Function Pre 28.8 %   Socioeconomic Pre 29 %   Psych/Spiritual Pre 29.14 %   Family Pre 26.4 %   GLOBAL Pre 28.55 %      PHQ-9: Recent Review Flowsheet Data    Depression screen [facility] 2/9 06/26/2016 06/06/2016 04/13/2016   Decreased Interest 0 0 0   Down, Depressed, Hopeless 0 0 0   PHQ - 2 Score 0 0 0      Psychosocial Evaluation and Intervention:     Psychosocial Evaluation - 06/06/16 0927      Psychosocial Evaluation &  Interventions   Interventions Encouraged to exercise with the program and follow exercise prescription   Comments no psychosocial needs identified, no interventions necessary    Continued Psychosocial Services Needed No      Psychosocial Re-Evaluation:     Psychosocial Re-Evaluation    [place] Name 06/26/16 1213             Psychosocial Re-Evaluation   Interventions Encouraged to attend Cardiac Rehabilitation for the exercise       Continued Psychosocial Services Needed No          Vocational Rehabilitation: Provide vocational rehab assistance to qualifying candidates.   Vocational Rehab Evaluation & Intervention:     Vocational Rehab - 05/30/16 0907      Initial Vocational Rehab Evaluation & Intervention   Assessment shows need for Vocational Rehabilitation No      Education: Education Goals: Education classes will be provided on a weekly basis, covering required topics. Participant will state understanding/return demonstration of topics presented.  Learning Barriers/Preferences:     Learning Barriers/Preferences - 05/29/16 1405      Learning Barriers/Preferences   Learning Barriers Sight   Learning Preferences Written Material      Education Topics: Count Your Pulse:  -Group instruction provided by verbal instruction, demonstration, patient participation and written materials to support subject.  Instructors address importance of being able to find your pulse and how to count your pulse when at home without a heart monitor.  Patients get hands on experience counting their pulse with staff help and individually.   Heart Attack, Angina, and Risk Factor Modification:  -Group instruction provided by verbal instruction, video, and written materials to support subject.  Instructors address signs and symptoms of angina and heart attacks.  Also discuss risk factors for heart disease and how to make changes to improve heart health risk factors.   Functional Fitness:   -Group instruction provided by verbal instruction, demonstration, patient participation, and written materials to support subject.  Instructors address safety measures for doing things around the house.  Discuss how to get up and down off the floor, how to pick things up properly, how to safely get out of a chair without assistance, and balance training.   Meditation and Mindfulness:  -Group instruction provided by verbal instruction, patient participation, and written materials to support subject.  Instructor addresses importance of mindfulness and meditation practice to help reduce stress and improve awareness.  Instructor also leads participants through a meditation exercise.    Stretching for Flexibility and Mobility:  -Group instruction provided by verbal instruction, patient participation, and written materials to support subject.  Instructors lead participants through series of stretches that are designed to increase flexibility thus improving mobility.  These stretches are additional exercise for major muscle groups that are typically performed during regular warm up and cool down.   Hands Only CPR Anytime:  -Group instruction provided by verbal instruction, video, patient participation and written materials to support subject.  Instructors co-teach with AHA video for hands only CPR.  Participants get hands on experience with mannequins.   Nutrition I class: Heart Healthy Eating:  -Group instruction provided by PowerPoint slides, verbal discussion, and written materials to support subject matter. The instructor gives an explanation and review of the Therapeutic Lifestyle Changes diet recommendations, which includes a discussion on lipid goals, dietary fat, sodium, fiber, plant stanol/sterol esters, sugar, and the components of a well-balanced, healthy diet.   Nutrition II class: Lifestyle Skills:  -Group instruction provided by PowerPoint slides, verbal discussion, and written materials  to support subject matter. The instructor gives an explanation and review of label reading, grocery shopping for heart health, heart healthy recipe modifications, and ways to make healthier choices when eating out. Flowsheet Row CARDIAC REHAB PHASE II EXERCISE from 06/13/2016 in [place]  Date  06/05/16  Educator  RD  Instruction Review Code  2- meets goals/outcomes      Diabetes Question & Answer:  -Group instruction provided by PowerPoint slides, verbal discussion, and written materials to support subject matter. The instructor gives an explanation and review of diabetes co-morbidities, pre- and post-prandial blood glucose goals, pre-exercise blood glucose goals, signs, symptoms, and treatment of hypoglycemia and hyperglycemia, and foot care basics.   Diabetes Blitz:  -Group instruction provided by PowerPoint slides, verbal discussion, and written materials to support subject matter. The instructor gives an explanation and review of the physiology behind type 1 and type 2 diabetes, diabetes medications and rational behind using different medications, pre- and post-prandial blood glucose recommendations and Hemoglobin A1c goals, diabetes diet, and exercise including blood glucose guidelines for exercising safely.  Flowsheet Row CARDIAC REHAB PHASE II EXERCISE from 06/13/2016 in [place]  Date  06/12/16  Educator  RD  Instruction Review Code  2- meets goals/outcomes      Portion Distortion:  -Group instruction provided by PowerPoint slides, verbal discussion, written materials, and food models to support subject matter. The instructor gives an explanation of serving size versus portion size, changes in portions sizes over the last 20 years, and what consists of a serving from each food group.   Stress Management:  -Group instruction provided by verbal instruction, video, and written materials to support subject  matter.   Instructors review role of stress in heart disease and how to cope with stress positively.     Exercising on Your Own:  -Group instruction provided by verbal instruction, power point, and written materials to support subject.  Instructors discuss benefits of exercise, components of exercise, frequency and intensity of exercise, and end points for exercise.  Also discuss use of nitroglycerin and activating EMS.  Review options of places to exercise outside of rehab.  Review guidelines for sex with heart disease.   Cardiac Drugs I:  -Group instruction provided by verbal instruction and written materials to support subject.  Instructor reviews cardiac drug classes: antiplatelets, anticoagulants, beta blockers, and statins.  Instructor discusses reasons, side effects, and lifestyle considerations for each drug class.   Cardiac Drugs II:  -Group instruction provided by verbal instruction and written materials to support subject.  Instructor reviews cardiac drug classes: angiotensin converting enzyme inhibitors (ACE-I), angiotensin II receptor blockers (ARBs), nitrates, and calcium channel blockers.  Instructor discusses reasons, side effects, and lifestyle considerations for each drug class.   Anatomy and Physiology of the Circulatory System:  -Group instruction provided by verbal instruction, video, and written materials to support subject.  Reviews functional anatomy of heart, how it relates to various diagnoses, and what role the heart plays in the overall system.   Knowledge Questionnaire Score:     Knowledge Questionnaire Score - 05/29/16 1647      Knowledge Questionnaire Score   Pre Score 16/24      Core Components/Risk Factors/Patient Goals at Admission:     Personal Goals and Risk Factors at Admission - 05/29/16 1453      Core Components/Risk Factors/Patient Goals on Admission   Personal Goal Other Yes   Personal Goal Learn exercise limitations and know exactly what to do at  the gym   Intervention Provide exercise programming and guidelines to assist with building confidence and exercise tolerance to return to gym [facility])   Expected Outcomes Pt will return to [facility] with confidence and know how to use machines correctly.      Core Components/Risk Factors/Patient Goals Review:      Goals and Risk Factor Review    Row Name 06/27/16 1151             Core Components/Risk Factors/Patient Goals Review   Personal Goals Review Improve shortness of breath with ADL's;Other       Review Pt states his breathing has improved and he is able to do more with less fatigue       Expected Outcomes Be able to do ADLs with less fatigue and SOB          Core Components/Risk Factors/Patient Goals at Discharge (Final Review):      Goals and Risk Factor Review - 06/27/16 1151      Core Components/Risk Factors/Patient Goals Review   Personal Goals Review Improve shortness of breath with ADL's;Other   Review Pt states his breathing has improved and he is able to do more with less fatigue   Expected Outcomes Be able to do ADLs with less fatigue and SOB      ITP Comments:     ITP Comments    Row Name 05/29/16 1325           ITP Comments Medical Director- Dr. Fransico Him, MD.          Comments: Henok is making expected progress toward personal goals after completing 6 sessions. Recommend continued exercise and life style  modification education including  stress management and relaxation techniques to decrease cardiac risk profile. Efraim's blood sugars have been stable. Barnet Pall, RN,BSN 06/28/2016 10:55 AM

## 2016-06-27 ENCOUNTER — Encounter (HOSPITAL_COMMUNITY)
Admission: RE | Admit: 2016-06-27 | Discharge: 2016-06-27 | Disposition: A | Discharge status: Home / Self Care | Payer: Medicare PPO | Source: Ambulatory Visit | Attending: Cardiovascular Disease | Admitting: Cardiovascular Disease

## 2016-06-27 DIAGNOSIS — Z48812 Encounter for surgical aftercare following surgery on the circulatory system: Secondary | ICD-10-CM | POA: Insufficient documentation

## 2016-06-27 DIAGNOSIS — Z9889 Other specified postprocedural states: Secondary | ICD-10-CM | POA: Insufficient documentation

## 2016-06-27 DIAGNOSIS — Z951 Presence of aortocoronary bypass graft: Secondary | ICD-10-CM | POA: Insufficient documentation

## 2016-06-29 ENCOUNTER — Encounter (HOSPITAL_COMMUNITY)
Admission: RE | Admit: 2016-06-29 | Discharge: 2016-06-29 | Disposition: A | Discharge status: Home / Self Care | Payer: Medicare PPO | Source: Ambulatory Visit | Attending: Cardiovascular Disease | Admitting: Cardiovascular Disease

## 2016-07-02 ENCOUNTER — Encounter (HOSPITAL_COMMUNITY): Payer: Medicare PPO

## 2016-07-04 ENCOUNTER — Encounter (HOSPITAL_COMMUNITY): Payer: Medicare PPO

## 2016-07-06 ENCOUNTER — Encounter (HOSPITAL_COMMUNITY): Admission: RE | Admit: 2016-07-06 | Payer: Medicare PPO | Source: Ambulatory Visit

## 2016-07-09 ENCOUNTER — Encounter (HOSPITAL_COMMUNITY): Payer: Self-pay | Admitting: *Deleted

## 2016-07-09 ENCOUNTER — Encounter (HOSPITAL_COMMUNITY): Payer: Medicare PPO

## 2016-07-09 ENCOUNTER — Telehealth (HOSPITAL_COMMUNITY): Payer: Self-pay | Admitting: *Deleted

## 2016-07-09 DIAGNOSIS — Z951 Presence of aortocoronary bypass graft: Secondary | ICD-10-CM

## 2016-07-09 NOTE — Progress Notes (Addendum)
Discharge Summary  Patient Details  Name: David Brandt MRN: [id number] Date of Birth: [birth year] Referring Provider:   Flowsheet Row CARDIAC REHAB PHASE II ORIENTATION from 05/29/2016 in [place]  Referring Provider  Skeet Latch, MD.       Number of Visits: 7  Reason for Discharge:  Early Exit:  transportation  Smoking History:  History  Smoking Status  . Former Smoker  . Packs/day: 1.00  . Years: 47.00  . Types: Cigarettes  . Quit date: 01/24/2016  Smokeless Tobacco  . Never Used    Diagnosis:  04/02/16 S/P CABG x 5  ADL UCSD:   Initial Exercise Prescription:     Initial Exercise Prescription - 05/29/16 1600      Date of Initial Exercise RX and Referring Provider   Date 05/29/16   Referring Provider Skeet Latch, MD.     Bike   Level 0.6   Minutes 10   METs 1.91     NuStep   Level 1   Minutes 10   METs 2     Track   Laps 10   Minutes 10   METs 2.74     Prescription Details   Frequency (times per week) 3   Duration Progress to 30 minutes of continuous aerobic without signs/symptoms of physical distress     Intensity   THRR 40-80% of Max Heartrate 61-122   Ratings of Perceived Exertion 11-13   Perceived Dyspnea 0-4     Progression   Progression Continue to progress workloads to maintain intensity without signs/symptoms of physical distress.     Resistance Training   Training Prescription Yes   Weight 2lbs.   Reps 10-12      Discharge Exercise Prescription (Final Exercise Prescription Changes):     Exercise Prescription Changes - 06/27/16 1100      Exercise Review   Progression Yes     Response to Exercise   Blood Pressure (Admit) 140/80   Blood Pressure (Exercise) 140/84   Blood Pressure (Exit) 142/80   Heart Rate (Admit) 63 bpm   Heart Rate (Exercise) 96 bpm   Heart Rate (Exit) 68 bpm   Rating of Perceived Exertion (Exercise) 13   Duration Progress to 45 minutes of aerobic  exercise without signs/symptoms of physical distress   Intensity THRR unchanged     Progression   Progression Continue to progress workloads to maintain intensity without signs/symptoms of physical distress.   Average METs 2.5     Resistance Training   Training Prescription Yes   Weight 3lbs   Reps 10-12     Bike   Level 1.8   Minutes 10   METs 2.2     NuStep   Level 3   Minutes 10   METs 2.1     Track   Laps 10   Minutes 10   METs 2.74     Home Exercise Plan   Plans to continue exercise at Home   Frequency Add 2 additional days to program exercise sessions.      Functional Capacity:     6 Minute Walk    Row Name 05/29/16 1638         6 Minute Walk   Phase Initial     Distance 1200 feet     Walk Time 6 minutes     # of Rest Breaks 0     MPH 2.27     METS 1.86     RPE 11  VO2 Peak 6.5     Symptoms No     Resting HR 69 bpm     Resting BP 140/70     Max Ex. HR 87 bpm     Max Ex. BP 122/62     2 Minute Post BP 112/60        Psychological, QOL, Others - Outcomes: PHQ 2/9: Depression screen [facility] 2/9 06/26/2016 06/06/2016 04/13/2016  Decreased Interest 0 0 0  Down, Depressed, Hopeless 0 0 0  PHQ - 2 Score 0 0 0    Quality of Life:     Quality of Life - 05/29/16 1649      Quality of Life Scores   Health/Function Pre 28.8 %   Socioeconomic Pre 29 %   Psych/Spiritual Pre 29.14 %   Family Pre 26.4 %   GLOBAL Pre 28.55 %      Personal Goals: Goals established at orientation with interventions provided to work toward goal.     Personal Goals and Risk Factors at Admission - 05/29/16 1453      Core Components/Risk Factors/Patient Goals on Admission   Personal Goal Other Yes   Personal Goal Learn exercise limitations and know exactly what to do at the gym   Intervention Provide exercise programming and guidelines to assist with building confidence and exercise tolerance to return to gym [facility])   Expected Outcomes Pt will return to [facility]  with confidence and know how to use machines correctly.       Personal Goals Discharge:     Goals and Risk Factor Review    Row Name 06/27/16 1151             Core Components/Risk Factors/Patient Goals Review   Personal Goals Review Improve shortness of breath with ADL's;Other       Review Pt states his breathing has improved and he is able to do more with less fatigue       Expected Outcomes Be able to do ADLs with less fatigue and SOB          Nutrition & Weight - Outcomes:     Pre Biometrics - 05/29/16 1635      Pre Biometrics   Height 5\' 9"  (1.753 m)   Weight 277 lb 5.4 oz (125.8 kg)   Waist Circumference 52 inches   Hip Circumference 51.75 inches   Waist to Hip Ratio 1 %   BMI (Calculated) 41   Triceps Skinfold 36 mm   % Body Fat 41.8 %   Grip Strength 52 kg   Flexibility 13 in   Single Leg Stand 6.93 seconds       Nutrition:   Nutrition Discharge:   Education Questionnaire Score:     Knowledge Questionnaire Score - 05/29/16 1647      Knowledge Questionnaire Score   Pre Score 16/24      [name] attended 7 sessions and decided not to return do to transportation issues. Cleo says he will continue exercise at the [facility].Barnet Pall, RN,BSN 07/09/2016 11:41 AM

## 2016-07-10 ENCOUNTER — Encounter: Payer: Self-pay | Admitting: *Deleted

## 2016-07-10 ENCOUNTER — Other Ambulatory Visit: Payer: Self-pay | Admitting: *Deleted

## 2016-07-10 ENCOUNTER — Other Ambulatory Visit: Payer: Self-pay | Admitting: Cardiothoracic Surgery

## 2016-07-10 DIAGNOSIS — Z951 Presence of aortocoronary bypass graft: Secondary | ICD-10-CM

## 2016-07-10 NOTE — Patient Outreach (Signed)
[facility]) Care Management   07/10/2016  Bartholomew Liljedahl [birth year] [id number]  Handsome Grissett is an 71 y.o. male  Subjective:  Diabetes: Pt reports he continues to take his blood sugars daily with good readings. Last A1C 6.3 on Oct 2017. Pt reports he continues to go to the [facility] and has completed the cardiac rehab program and working independently with a routine at the local [employer] to continue his exercises. Pt continues to eat healthy watching his carbohydrates and protein intake. No other issues reported as pt feels comfortable managing his ongoing care.   Objective:   ROS  Physical Exam  Encounter Medications:   Outpatient Encounter Prescriptions as of 07/10/2016  Medication Sig  . allopurinol (ZYLOPRIM) 300 MG tablet Take 150 mg by mouth daily.  Marland Kitchen amiodarone (PACERONE) 400 MG tablet Take 0.5 tablets (200 mg total) by mouth daily.  . ANDROGEL PUMP 20.25 MG/ACT (1.62%) GEL Apply 2 each topically daily.  Marland Kitchen aspirin EC 325 MG EC tablet Take 1 tablet (325 mg total) by mouth daily.  Marland Kitchen atorvastatin (LIPITOR) 40 MG tablet Take 40 mg by mouth every evening.  . Cholecalciferol (VITAMIN D3) 2000 units TABS Take 2,000 Units by mouth daily.  . fluticasone (FLOVENT HFA) 220 MCG/ACT inhaler Inhale 2 puffs into the lungs daily as needed (for shortness of breath).   . hydrocortisone cream 1 % Apply 1 application topically as needed for itching.  . metoprolol tartrate (LOPRESSOR) 25 MG tablet Take 0.5 tablets (12.5 mg total) by mouth 2 (two) times daily.  Marland Kitchen OVER THE COUNTER MEDICATION Take 1 capsule by mouth 2 (two) times daily. ProbioticXL  . OVER THE COUNTER MEDICATION Take 2 capsules by mouth 2 (two) times daily. OmegaXL  . SitaGLIPtin-MetFORMIN HCl (JANUMET XR) 50-1000 MG TB24 Take 1 tablet by mouth every evening.  . valsartan-hydrochlorothiazide (DIOVAN-HCT) 320-12.5 MG per tablet Take 1 tablet by mouth daily.     No facility-administered encounter medications on file as  of 07/10/2016.     Functional Status:   In your present state of health, do you have any difficulty performing the following activities: 04/13/2016 03/29/2016  Hearing? N N  Vision? N N  Difficulty concentrating or making decisions? N N  Walking or climbing stairs? N N  Dressing or bathing? N N  Doing errands, shopping? N N  Preparing Food and eating ? N -  Using the Toilet? N -  In the past six months, have you accidently leaked urine? N -  Do you have problems with loss of bowel control? N -  Managing your Medications? N -  Managing your Finances? N -  Housekeeping or managing your Housekeeping? N -  Some recent data might be hidden    Fall/Depression Screening:    PHQ 2/9 Scores 07/10/2016 06/26/2016 06/06/2016 04/13/2016  PHQ - 2 Score 0 0 0 0   BP 130/70 (BP Location: Left Arm, Patient Position: Sitting, Cuff Size: Normal)   Pulse 66   Resp 20   Ht 1.753 m (5\' 9" )   Wt 277 lb (125.6 kg)   SpO2 98%   BMI 40.91 kg/m   Assessment:   Case management related to diabetes  Plan:  Will completed a physical assessment with no abnormal findings today. Will continue to verify pt continue adherence with his daily BS and aware of what to do if acute symptoms should occur. Verified pt continues to have enough diabetic supplies with no needs or issues reported today. Will review the plan of  care and goals that remains in place. Based upon pt's progress will follow up telephonically over the next 2 months and again review plan of care. Will schedule follow call for next month according to pt's schedule.   Raina Mina, RN Care Management Coordinator [place] Office [phone]

## 2016-07-11 ENCOUNTER — Encounter (HOSPITAL_COMMUNITY): Payer: Medicare PPO

## 2016-07-11 NOTE — Addendum Note (Signed)
Encounter addended by: Meta Hatchet on: 07/11/2016  9:16 AM<BR>    Actions taken: Visit Navigator Flowsheet section accepted

## 2016-07-11 NOTE — Addendum Note (Signed)
Encounter addended by: Jewel Baize, RD on: 07/11/2016  1:48 PM<BR>    Actions taken: Visit Navigator Flowsheet section accepted

## 2016-07-12 ENCOUNTER — Ambulatory Visit: Payer: Medicare PPO | Admitting: Cardiothoracic Surgery

## 2016-07-13 ENCOUNTER — Encounter (HOSPITAL_COMMUNITY): Payer: Medicare PPO

## 2016-07-16 ENCOUNTER — Encounter (HOSPITAL_COMMUNITY): Payer: Medicare PPO

## 2016-07-16 DIAGNOSIS — E119 Type 2 diabetes mellitus without complications: Secondary | ICD-10-CM | POA: Diagnosis not present

## 2016-07-16 DIAGNOSIS — E291 Testicular hypofunction: Secondary | ICD-10-CM | POA: Diagnosis not present

## 2016-07-16 DIAGNOSIS — I10 Essential (primary) hypertension: Secondary | ICD-10-CM | POA: Diagnosis not present

## 2016-07-18 ENCOUNTER — Encounter (HOSPITAL_COMMUNITY): Payer: Medicare PPO

## 2016-07-20 ENCOUNTER — Encounter (HOSPITAL_COMMUNITY): Payer: Medicare PPO

## 2016-07-23 ENCOUNTER — Encounter (HOSPITAL_COMMUNITY): Payer: Medicare PPO

## 2016-07-25 ENCOUNTER — Encounter (HOSPITAL_COMMUNITY): Payer: Medicare PPO

## 2016-07-27 ENCOUNTER — Encounter (HOSPITAL_COMMUNITY): Payer: Medicare PPO

## 2016-07-30 ENCOUNTER — Encounter (HOSPITAL_COMMUNITY): Payer: Medicare PPO

## 2016-08-01 ENCOUNTER — Encounter (HOSPITAL_COMMUNITY): Payer: Medicare PPO

## 2016-08-02 ENCOUNTER — Ambulatory Visit (INDEPENDENT_AMBULATORY_CARE_PROVIDER_SITE_OTHER): Payer: Medicare PPO | Admitting: Cardiothoracic Surgery

## 2016-08-02 ENCOUNTER — Encounter: Payer: Self-pay | Admitting: Cardiothoracic Surgery

## 2016-08-02 ENCOUNTER — Ambulatory Visit
Admission: RE | Admit: 2016-08-02 | Discharge: 2016-08-02 | Disposition: A | Discharge status: Home / Self Care | Payer: Medicare PPO | Source: Ambulatory Visit | Attending: Cardiothoracic Surgery | Admitting: Cardiothoracic Surgery

## 2016-08-02 VITALS — BP 111/69 | HR 62 | Resp 18 | Ht 70.0 in | Wt 279.0 lb

## 2016-08-02 DIAGNOSIS — I25110 Atherosclerotic heart disease of native coronary artery with unstable angina pectoris: Secondary | ICD-10-CM

## 2016-08-02 DIAGNOSIS — Z951 Presence of aortocoronary bypass graft: Secondary | ICD-10-CM

## 2016-08-02 NOTE — Progress Notes (Signed)
[address]       [place] [address]             [phone]      Sidi Mcginniss [place] Medical Record [id number] Date of Birth: May 24, 1946  Referring: [name], MD Primary Care: [name], MD  Chief Complaint:   POST OP FOLLOW UP 04/02/2016  OPERATIVE REPORT PREOPERATIVE DIAGNOSIS:  Unstable angina. POSTOPERATIVE DIAGNOSIS:  Unstable angina. SURGICAL PROCEDURE:  Coronary artery bypass grafting x5 with the left internal mammary to the left anterior descending coronary artery, intramyocardial position, sequential reversed saphenous vein graft to the first and second diagonal, reversed saphenous vein graft to the obtuse marginal, reversed saphenous vein graft to the distal right coronary artery with stent placement of left femoral arterial line. Right greater saphenous vein harvesting thigh and calf, endoscopically. SURGEON:  [name], MD.  History of Present Illness:     Patient turns office stay in follow-up after coronary artery bypass grafting. He developed post-operative atrial fibrillation and was started on amiodarone.  There were were no other postoperative complications and he was discharged 04/11/16. Patient has made good progress since discharge home, notes he feels much better ,completed cardiac rehab ,[facility] He ran out of Cordarone  and did not refill.    Past Medical History:  Diagnosis Date  . Abnormal cardiovascular stress test 03/29/2016  . Allergic rhinitis   . Diabetes ([place]) 03/29/2016  . Gout   . Hyperlipidemia   . Hypertension   . Sleep apnea    cpap     History  Smoking Status  . Former Smoker  . Packs/day: 1.00  . Years: 47.00  . Types: Cigarettes  . Quit date: 01/24/2016  Smokeless Tobacco  . Never Used    History  Alcohol Use  . 2.4 oz/week  . 4 Cans of beer per week     Allergies  Allergen Reactions  . Sulfa Antibiotics Hives    Current Outpatient Prescriptions    Medication Sig Dispense Refill  . allopurinol (ZYLOPRIM) 300 MG tablet Take 150 mg by mouth daily.  3  . ANDROGEL PUMP 20.25 MG/ACT (1.62%) GEL Apply 2 each topically daily.    Marland Kitchen aspirin EC 325 MG EC tablet Take 1 tablet (325 mg total) by mouth daily. 30 tablet 0  . atorvastatin (LIPITOR) 40 MG tablet Take 40 mg by mouth every evening.  3  . Cholecalciferol (VITAMIN D3) 2000 units TABS Take 2,000 Units by mouth daily.    . fluticasone (FLOVENT HFA) 220 MCG/ACT inhaler Inhale 2 puffs into the lungs daily as needed (for shortness of breath).     . hydrocortisone cream 1 % Apply 1 application topically as needed for itching.    . metoprolol tartrate (LOPRESSOR) 25 MG tablet Take 0.5 tablets (12.5 mg total) by mouth 2 (two) times daily. 60 tablet 3  . OVER THE COUNTER MEDICATION Take 1 capsule by mouth 2 (two) times daily. ProbioticXL    . OVER THE COUNTER MEDICATION Take 2 capsules by mouth 2 (two) times daily. OmegaXL    . SitaGLIPtin-MetFORMIN HCl (JANUMET XR) 50-1000 MG TB24 Take 1 tablet by mouth every evening.    . valsartan-hydrochlorothiazide (DIOVAN-HCT) 320-12.5 MG per tablet Take 1 tablet by mouth daily.      Marland Kitchen amiodarone (PACERONE) 400 MG tablet Take 0.5 tablets (200 mg total) by mouth daily. (Patient not taking: Reported on 08/02/2016) 30 tablet 0   No current  facility-administered medications for this visit.        Physical Exam: BP 111/69 (BP Location: Right Arm, Patient Position: Sitting, Cuff Size: Large)   Pulse 62   Resp 18   Ht 5\' 10"  (1.778 m)   Wt 279 lb (126.6 kg)   SpO2 94% Comment: ON RA  BMI 40.03 kg/m   General appearance: alert and cooperative Neurologic: intact Heart: regular rate and rhythm, S1, S2 normal, no murmur, click, rub or gallop Lungs: clear to auscultation bilaterally Abdomen: soft, non-tender; bowel sounds normal; no masses,  no organomegaly Extremities: extremities normal, atraumatic, no cyanosis or edema and Homans sign is negative, no sign  of DVT Wound: Incisions are healing well, sternum is stable   Diagnostic Studies & Laboratory data:     Recent Radiology Findings:   Dg Chest 2 View  Result Date: 08/02/2016 CLINICAL DATA:  CABG EXAM: CHEST  2 VIEW COMPARISON:  05/10/2016 FINDINGS: Prior CABG. Heart is normal size. No confluent airspace opacity or effusion. No acute bony abnormality. IMPRESSION: CABG.  No active disease. Electronically Signed   By: Rolm Baptise M.D.   On: 08/02/2016 09:52    I have independently reviewed the above radiology studies  and reviewed the findings with the patient.   Recent Lab Findings: Lab Results  Component Value Date   WBC 16.0 (H) 04/11/2016   HGB 9.3 (L) 04/11/2016   HCT 29.6 (L) 04/11/2016   PLT 496 (H) 04/11/2016   GLUCOSE 84 05/02/2016   CHOL 126 03/30/2016   TRIG 296 (H) 03/30/2016   HDL 31 (L) 03/30/2016   LDLCALC 36 03/30/2016   ALT 28 04/01/2016   AST 29 04/01/2016   NA 137 05/02/2016   K 4.5 05/02/2016   CL 102 05/02/2016   CREATININE 1.03 05/02/2016   BUN 11 05/02/2016   CO2 28 05/02/2016   TSH 3.990 04/06/2016   INR 1.51 04/02/2016   HGBA1C 6.3 (H) 03/31/2016      Assessment / Plan:      Overall the patient is doing well postoperatively Will not renew Cordarone  Cardiology appointment in May Plan to see him back as needed.   [name] MD      [address] [place] [address] Office [phone]   Beeper [phone]  08/02/2016 10:47 AM

## 2016-08-03 ENCOUNTER — Encounter (HOSPITAL_COMMUNITY): Payer: Medicare PPO

## 2016-08-06 ENCOUNTER — Encounter (HOSPITAL_COMMUNITY): Payer: Medicare PPO

## 2016-08-08 ENCOUNTER — Encounter (HOSPITAL_COMMUNITY): Payer: Medicare PPO

## 2016-08-10 ENCOUNTER — Encounter (HOSPITAL_COMMUNITY): Payer: Medicare PPO

## 2016-08-10 ENCOUNTER — Other Ambulatory Visit: Payer: Self-pay | Admitting: *Deleted

## 2016-08-10 ENCOUNTER — Encounter: Payer: Self-pay | Admitting: *Deleted

## 2016-08-10 NOTE — Patient Outreach (Signed)
[facility]) Care Management  08/10/2016  David Brandt 1946/02/10 [id number]   RN attempted follow up call today however unsuccessful. RN unable to leave a message but will reschedule another follow up call next week. Note pt was doing well with great progress. Will continue to review his goals and plan of care accordingly in preparing for pt's to graduate from the [facility] program and services.  Raina Mina, RN Care Management Coordinator [place] Office [phone]

## 2016-08-10 NOTE — Patient Outreach (Signed)
[place] [facility]) Care Management  08/10/2016  David Brandt Nov 18, 1945 [id number]  RN received a call from pt today and reviewed his ongoing plan of care. RN confirmed pt's adherence to his plan of care with all goals met with no reported problems. Pt states recent death in the family has delayed his healthy eating habits and exercises at the local [employer] however pt is aware that this is something he will have to maintain in managing his diabetes. Reports a recent follow up with his surgeon who has now discharged him from services and pt informed to follow up with primary provider if any issues occur. Pt confirms adherence with all his medications and attendance to all his medical appointments with no problems.   Plan of care reviewed and discussed as pt has met all  His goals with no additional needs and no other needs to address at this time. Pt aware if he is in need of [facility] services or has additional ED or hospital visits he may received additional contacts via [facility] care management team. Pt receptive and very appreciative for the services that has been rendered via this RN case manager. Will close this case with goals met and alert [facility] CMA and pt's primary provider Dr. Carlis Abbott.   Raina Mina, RN Care Management Coordinator [place] Office [phone]

## 2016-08-13 ENCOUNTER — Encounter (HOSPITAL_COMMUNITY): Payer: Medicare PPO

## 2016-08-15 ENCOUNTER — Encounter (HOSPITAL_COMMUNITY): Payer: Medicare PPO

## 2016-08-17 ENCOUNTER — Encounter (HOSPITAL_COMMUNITY): Payer: Medicare PPO

## 2016-08-20 ENCOUNTER — Encounter (HOSPITAL_COMMUNITY): Payer: Medicare PPO

## 2016-08-20 DIAGNOSIS — G4733 Obstructive sleep apnea (adult) (pediatric): Secondary | ICD-10-CM | POA: Diagnosis not present

## 2016-08-22 ENCOUNTER — Encounter (HOSPITAL_COMMUNITY): Payer: Medicare PPO

## 2016-08-24 ENCOUNTER — Encounter (HOSPITAL_COMMUNITY): Payer: Medicare PPO

## 2016-08-27 ENCOUNTER — Encounter (HOSPITAL_COMMUNITY): Payer: Medicare PPO

## 2016-08-29 ENCOUNTER — Encounter (HOSPITAL_COMMUNITY): Payer: Medicare PPO

## 2016-08-31 ENCOUNTER — Encounter (HOSPITAL_COMMUNITY): Payer: Medicare PPO

## 2016-09-03 ENCOUNTER — Encounter (HOSPITAL_COMMUNITY): Payer: Medicare PPO

## 2016-09-05 ENCOUNTER — Encounter (HOSPITAL_COMMUNITY): Payer: Medicare PPO

## 2016-09-07 ENCOUNTER — Encounter (HOSPITAL_COMMUNITY): Payer: Medicare PPO

## 2016-09-10 ENCOUNTER — Encounter (HOSPITAL_COMMUNITY): Payer: Medicare PPO

## 2016-09-12 ENCOUNTER — Encounter (HOSPITAL_COMMUNITY): Payer: Medicare PPO

## 2016-09-14 ENCOUNTER — Encounter (HOSPITAL_COMMUNITY): Payer: Medicare PPO

## 2016-09-17 ENCOUNTER — Encounter (HOSPITAL_COMMUNITY): Payer: Medicare PPO

## 2016-09-19 ENCOUNTER — Encounter (HOSPITAL_COMMUNITY): Payer: Medicare PPO

## 2016-09-21 ENCOUNTER — Encounter (HOSPITAL_COMMUNITY): Payer: Medicare PPO

## 2016-09-24 ENCOUNTER — Encounter (HOSPITAL_COMMUNITY): Payer: Medicare PPO

## 2016-09-26 ENCOUNTER — Encounter (HOSPITAL_COMMUNITY): Payer: Medicare PPO

## 2016-09-28 ENCOUNTER — Encounter (HOSPITAL_COMMUNITY): Payer: Medicare PPO

## 2016-10-01 ENCOUNTER — Encounter (HOSPITAL_COMMUNITY): Payer: Medicare PPO

## 2016-10-03 ENCOUNTER — Encounter (HOSPITAL_COMMUNITY): Payer: Medicare PPO

## 2016-10-05 ENCOUNTER — Encounter (HOSPITAL_COMMUNITY): Payer: Medicare PPO

## 2016-10-08 DIAGNOSIS — E119 Type 2 diabetes mellitus without complications: Secondary | ICD-10-CM | POA: Diagnosis not present

## 2016-10-08 DIAGNOSIS — E291 Testicular hypofunction: Secondary | ICD-10-CM | POA: Diagnosis not present

## 2016-10-08 DIAGNOSIS — I10 Essential (primary) hypertension: Secondary | ICD-10-CM | POA: Diagnosis not present

## 2016-10-08 DIAGNOSIS — R079 Chest pain, unspecified: Secondary | ICD-10-CM | POA: Diagnosis not present

## 2016-10-08 DIAGNOSIS — M2550 Pain in unspecified joint: Secondary | ICD-10-CM | POA: Diagnosis not present

## 2016-11-12 ENCOUNTER — Encounter: Payer: Self-pay | Admitting: Cardiovascular Disease

## 2016-11-12 ENCOUNTER — Ambulatory Visit (INDEPENDENT_AMBULATORY_CARE_PROVIDER_SITE_OTHER): Payer: Medicare PPO | Admitting: Cardiovascular Disease

## 2016-11-12 VITALS — BP 136/82 | HR 60 | Ht 69.0 in | Wt 291.8 lb

## 2016-11-12 DIAGNOSIS — I2510 Atherosclerotic heart disease of native coronary artery without angina pectoris: Secondary | ICD-10-CM

## 2016-11-12 DIAGNOSIS — E7800 Pure hypercholesterolemia, unspecified: Secondary | ICD-10-CM | POA: Diagnosis not present

## 2016-11-12 DIAGNOSIS — I10 Essential (primary) hypertension: Secondary | ICD-10-CM | POA: Diagnosis not present

## 2016-11-12 DIAGNOSIS — Z951 Presence of aortocoronary bypass graft: Secondary | ICD-10-CM | POA: Diagnosis not present

## 2016-11-12 MED ORDER — FELODIPINE ER 10 MG PO TB24: 10.0000 mg | ORAL_TABLET | Freq: Every day | ORAL | 3 refills | Status: DC

## 2016-11-12 NOTE — Progress Notes (Signed)
Cardiology Office Note   Date:  11/12/2016   ID:  David Brandt, DOB Jul 27, 1945, MRN [id number]  PCP:  [name], MD  Cardiologist:   Skeet Latch, MD   Chief Complaint  Patient presents with  . Follow-up    6 months;     History of Present Illness: David Brandt is a 71 y.o. male with CAD s/p CABG (LIMA-->LAD, SVG-->D1,D2, SVG-->OM, SVG-->RCA) , diabetes, OSA on CPAP, post-operative atrial fibrillation, hypertension, and hyperlipidemia who presents for follow.   David Brandt initially presented with chest pressure and shortness of breath 03/2016.  He underwent Lexiscan Myoview on 03/28/16 that revealed LVEF 48% with a large inferior and inferior lateral MI and no evidence of ischemia. There was also basal to mid inferior wall akinesis.  He subsequently underwent left heart catheterization where he was found to have severe three-vessel CAD.  He underwent CABG with Dr. Servando Snare on 04/02/16.  He developed post-operative atrial fibrillation and was started on amiodarone.  There were were no other postoperative complications and he was discharged 04/11/16.  Echo 03/30/16 revealed LVEF 55-60% and was otherwise unremarkable.   David Brandt followed up with Dr. Servando Snare and reduced amiodarone to 200mg  04/2016.  This was discontinued 07/2016.  He continues to do well. He exercises at the [facility] 5 days per week by riding a bike and using a stepper machine for approximately 50 minutes. He has no chest pain or shortness of breath with this activity. He does note that he has been eating more and is gaining weight despite exercising. He finds himself eating late at night. He denies any chest pain, shortness of breath, lower extremity edema, orthopnea, or PND. He does not check his blood pressure at home, but when he goes to the convenience store he typically finds that it is well-controlled. He has been out of felodipine for several days.   Past Medical History:  Diagnosis Date  .  Abnormal cardiovascular stress test 03/29/2016  . Allergic rhinitis   . Diabetes ([place]) 03/29/2016  . Gout   . Hyperlipidemia   . Hypertension   . Sleep apnea    cpap    Past Surgical History:  Procedure Laterality Date  . CARDIAC CATHETERIZATION N/A 03/29/2016   Procedure: Right/Left Heart Cath and Coronary Angiography;  Surgeon: Nelva Bush, MD;  Location: [place] [facility];  Service: Cardiovascular;  Laterality: N/A;  . CARDIAC CATHETERIZATION N/A 03/29/2016   Procedure: Intravascular Pressure Wire/FFR Study;  Surgeon: Nelva Bush, MD;  Location: [place] [facility];  Service: Cardiovascular;  Laterality: N/A;  . CARDIAC CATHETERIZATION Left 04/02/2016   Procedure: FEMORAL ARTERIAL LINE INSERTION;  Surgeon: [name], MD;  Location: [place];  Service: Open Heart Surgery;  Laterality: Left;  . COLONOSCOPY    . CORONARY ARTERY BYPASS GRAFT N/A 04/02/2016   Procedure: CORONARY ARTERY BYPASS GRAFTING (CABG) X 5 UTILIZING LEFT INTERNAL MAMMARY ARTERY AND ENDOSCOPICALLY HARVESTED GREATER SAPHENOUS VEIN ; Mammary to LAD, Sequencial 1st to 2nd Diagonal, Vein to OM, Vein to Distal RCA.;  Surgeon: [name], MD;  Location: [place];  Service: Open Heart Surgery;  Laterality: N/A;  . LYMPH NODE BIOPSY Left 04/02/2016   Procedure: INCIDENTAL LEFT MAMMARY LYMPH NODE BIOPSY;  Surgeon: [name], MD;  Location: [place];  Service: Open Heart Surgery;  Laterality: Left;  . mass abdomen  1955  . TEE WITHOUT CARDIOVERSION N/A 04/02/2016   Procedure: TRANSESOPHAGEAL ECHOCARDIOGRAM (TEE);  Surgeon: [name], MD;  Location: [facility];  Service: Open Heart Surgery;  Laterality: N/A;     Current Outpatient Prescriptions  Medication Sig Dispense Refill  . allopurinol (ZYLOPRIM) 300 MG tablet Take 150 mg by mouth daily.  3  . ANDROGEL PUMP 20.25 MG/ACT (1.62%) GEL Apply 2 each topically daily.    Marland Kitchen aspirin EC 81 MG tablet Take 81 mg by mouth daily.    Marland Kitchen atorvastatin (LIPITOR) 40 MG  tablet Take 40 mg by mouth every evening.  3  . Cholecalciferol (VITAMIN D3) 2000 units TABS Take 2,000 Units by mouth daily.    . fluticasone (FLOVENT HFA) 220 MCG/ACT inhaler Inhale 2 puffs into the lungs daily as needed (for shortness of breath).     . hydrocortisone cream 1 % Apply 1 application topically as needed for itching.    . metFORMIN (GLUCOPHAGE-XR) 500 MG 24 hr tablet Take 500 mg by mouth daily.  1  . metoprolol tartrate (LOPRESSOR) 25 MG tablet Take 0.5 tablets (12.5 mg total) by mouth 2 (two) times daily. 60 tablet 3  . OVER THE COUNTER MEDICATION Take 1 capsule by mouth 2 (two) times daily. ProbioticXL    . OVER THE COUNTER MEDICATION Take 2 capsules by mouth 2 (two) times daily. OmegaXL    . SitaGLIPtin-MetFORMIN HCl (JANUMET XR) 50-1000 MG TB24 Take 1 tablet by mouth every evening.    . SYMBICORT 160-4.5 MCG/ACT inhaler USE 1 PUFF TWICE A DAY  11  . valsartan-hydrochlorothiazide (DIOVAN-HCT) 320-12.5 MG per tablet Take 1 tablet by mouth daily.      . felodipine (PLENDIL) 10 MG 24 hr tablet Take 1 tablet (10 mg total) by mouth daily. 90 tablet 3   No current facility-administered medications for this visit.     Allergies:   Sulfa antibiotics    Social History:  The patient  reports that he quit smoking about 9 months ago. His smoking use included Cigarettes. He has a 47.00 pack-year smoking history. He has never used smokeless tobacco. He reports that he drinks about 2.4 oz of alcohol per week . He reports that he uses drugs, including Marijuana, about 7 times per week.   Family History:  [name] family history includes Colon cancer (age of onset: 95) in his father; Diabetes in his maternal uncle and mother; Heart disease in his maternal aunt; Stroke in his mother.    ROS:  Please see the history of present illness.   Otherwise, review of systems are positive for none.   All other systems are reviewed and negative.    PHYSICAL EXAM: VS:  BP 136/82   Pulse 60   Ht  5\' 9"  (1.753 m)   Wt 132.4 kg (291 lb 12.8 oz)   BMI 43.09 kg/m  , BMI Body mass index is 43.09 kg/m. GENERAL:  Well appearing.  No acute distress. HEENT:  Pupils equal round and reactive, fundi not visualized, oral mucosa unremarkable NECK:  No jugular venous distention, waveform within normal limits, carotid upstroke brisk and symmetric, no bruits LUNGS:  Clear to auscultation bilaterally.  No crackles, wheezes or rhonchi. HEART:  RRR.  PMI not displaced or sustained,S1 and S2 within normal limits, no S3, no S4, no clicks, no rubs, no murmurs CHEST: Midline sternal incision well-healed.   ABD:  Flat, positive bowel sounds normal in frequency in pitch, no bruits, no rebound, no guarding, no midline pulsatile mass, no hepatomegaly, no splenomegaly EXT:  2 plus pulses throughout, no edema, no cyanosis no clubbing SKIN:  No  rashes no nodules NEURO:  Cranial nerves II through XII grossly intact, motor grossly intact throughout PSYCH:  Cognitively intact, oriented to person place and time    EKG:  EKG is not ordered today. The ekg ordered 05/02/16 demonstrates sinus rhythm. Rate 65 bpm.   Lexiscan Myoview 03/29/16: Nuclear stress EF: 48%.  The left ventricular ejection fraction is mildly decreased (45-54%).   Large inferior and inferior lateral wall MI from apex to base with no ischemia EF 48% with mid and basal inferior wall akinesis   LHC 03/29/16: Conclusions: 1. Severe 3 vessel coronary artery disease, as detailed below, including 50% distal LMCA/ostial LAD stenoses (FFR 0.70), 80% D1 lesion, 80% ostial LCx stenosis, and diffusely diseased RCA with 99% mid RCA stenosis with TIMI-1 flow and competitive filling of the PDA and rPL branches by left-to-right collaterals. 2. Mildly decreased LV systolic function with inferior akinesis (LVEF 45-50%) 3. Upper normal to mildly elevated left heart filling pressures. 4. Reduced Fick cardiac output.   Echo  03/30/16:  Study Conclusions  -  Left ventricle: The cavity size was normal. Wall thickness was   increased in a pattern of moderate LVH. Systolic function was   normal. The estimated ejection fraction was in the range of 55%   to 60%. Left ventricular diastolic function parameters were   normal. - Left atrium: The atrium was mildly dilated. - Atrial septum: No defect or patent foramen ovale was identified. - Pulmonary arteries: PA peak pressure: 34 mm Hg (S).  Recent Labs: 04/01/2016: ALT 28 04/06/2016: TSH 3.990 04/07/2016: Magnesium 2.1 04/11/2016: Hemoglobin 9.3; Platelets 496 05/02/2016: BUN 11; Creat 1.03; Potassium 4.5; Sodium 137    Lipid Panel    Component Value Date/Time   CHOL 126 03/30/2016 0156   TRIG 296 (H) 03/30/2016 0156   HDL 31 (L) 03/30/2016 0156   CHOLHDL 4.1 03/30/2016 0156   VLDL 59 (H) 03/30/2016 0156   LDLCALC 36 03/30/2016 0156      Wt Readings from Last 3 Encounters:  11/12/16 132.4 kg (291 lb 12.8 oz)  08/02/16 126.6 kg (279 lb)  07/10/16 125.6 kg (277 lb)      ASSESSMENT AND PLAN:  # CAD s/p CABG: David Brandt is doing well and has no angina.  Continue aspirin, atorvastatin, and metoporlol.  He was encouraged to keep exercising and work on weight loss by reducing caloric intake.    # Hypertension:  Blood pressure is mildly above goal but he hasn't taken felodipine.  We will refill it today.  Continue metoprolol, valsartan and HCTZ.  # Post-op atrial fibrillation: No recurrence since stopping amiodarone.  Continue metoprolol.  # Hyperlipidemia:  LDL 36 03/2016.  Continue atorvastatin.  # OSA: Encouraged continued use of CPAP.   Current medicines are reviewed at length with the patient today.  The patient does not have concerns regarding medicines.  The following changes have been made:  no change  Labs/ tests ordered today include:   No orders of the defined types were placed in this encounter.    Disposition:   FU with Caprice Mccaffrey C. Oval Linsey, MD, [facility] in 6 months.      This note was written with the assistance of speech recognition software.  Please excuse any transcriptional errors.  Signed, Mac Dowdell C. Oval Linsey, MD, [facility]  11/12/2016 1:17 PM    [facility]

## 2016-11-12 NOTE — Patient Instructions (Signed)

## 2016-11-23 ENCOUNTER — Ambulatory Visit (HOSPITAL_COMMUNITY)
Admission: EM | Admit: 2016-11-23 | Discharge: 2016-11-23 | Disposition: A | Discharge status: Home / Self Care | Payer: Medicare PPO | Attending: Internal Medicine | Admitting: Internal Medicine

## 2016-11-23 ENCOUNTER — Encounter (HOSPITAL_COMMUNITY): Payer: Self-pay | Admitting: *Deleted

## 2016-11-23 DIAGNOSIS — L03115 Cellulitis of right lower limb: Secondary | ICD-10-CM

## 2016-11-23 DIAGNOSIS — T148XXA Other injury of unspecified body region, initial encounter: Secondary | ICD-10-CM

## 2016-11-23 DIAGNOSIS — S90421A Blister (nonthermal), right great toe, initial encounter: Secondary | ICD-10-CM

## 2016-11-23 DIAGNOSIS — L089 Local infection of the skin and subcutaneous tissue, unspecified: Secondary | ICD-10-CM

## 2016-11-23 MED ORDER — CEPHALEXIN 500 MG PO CAPS: 500.0000 mg | ORAL_CAPSULE | Freq: Four times a day (QID) | ORAL | 0 refills | Status: DC

## 2016-11-23 NOTE — Discharge Instructions (Signed)
The blister between your toes was infected. It is causing an infection to the part of the foot just below the toe. Take the medication as directed. Use warm water soaks 2 or 3 times a day for the next couple days. Keep a dressing over the wound for 23 days until healed. Otherwise, keep it dry at all other times. For any worsening new symptoms or problems follow-up with your primary care doctor or may return.

## 2016-11-23 NOTE — ED Provider Notes (Signed)
CSN: [id number]     Arrival date & time 11/23/16  1000 History   First MD Initiated Contact with Patient 11/23/16 1012     Chief Complaint  Patient presents with  . Foot Pain   (Consider location/radiation/quality/duration/timing/severity/associated sxs/prior Treatment) 71 year old diabetic male with right forefoot pain. He noticed some sort of blister between the great toe and second toe couple days ago. Feeling uncomfortable but he does not describe pain. No history of trauma. It was similar episode several months ago when he was wearing flip-flops.      Past Medical History:  Diagnosis Date  . Abnormal cardiovascular stress test 03/29/2016  . Allergic rhinitis   . Diabetes ([place]) 03/29/2016  . Gout   . Hyperlipidemia   . Hypertension   . Sleep apnea    cpap   Past Surgical History:  Procedure Laterality Date  . CARDIAC CATHETERIZATION N/A 03/29/2016   Procedure: Right/Left Heart Cath and Coronary Angiography;  Surgeon: Nelva Bush, MD;  Location: [facility];  Service: Cardiovascular;  Laterality: N/A;  . CARDIAC CATHETERIZATION N/A 03/29/2016   Procedure: Intravascular Pressure Wire/FFR Study;  Surgeon: Nelva Bush, MD;  Location: [facility];  Service: Cardiovascular;  Laterality: N/A;  . CARDIAC CATHETERIZATION Left 04/02/2016   Procedure: FEMORAL ARTERIAL LINE INSERTION;  Surgeon: [name], MD;  Location: [facility];  Service: Open Heart Surgery;  Laterality: Left;  . COLONOSCOPY    . CORONARY ARTERY BYPASS GRAFT N/A 04/02/2016   Procedure: CORONARY ARTERY BYPASS GRAFTING (CABG) X 5 UTILIZING LEFT INTERNAL MAMMARY ARTERY AND ENDOSCOPICALLY HARVESTED GREATER SAPHENOUS VEIN ; Mammary to LAD, Sequencial 1st to 2nd Diagonal, Vein to OM, Vein to Distal RCA.;  Surgeon: [name], MD;  Location: [facility];  Service: Open Heart Surgery;  Laterality: N/A;  . LYMPH NODE BIOPSY Left 04/02/2016   Procedure: INCIDENTAL LEFT MAMMARY LYMPH NODE BIOPSY;  Surgeon:  [name], MD;  Location: [place];  Service: Open Heart Surgery;  Laterality: Left;  . mass abdomen  1955  . TEE WITHOUT CARDIOVERSION N/A 04/02/2016   Procedure: TRANSESOPHAGEAL ECHOCARDIOGRAM (TEE);  Surgeon: [name], MD;  Location: [place];  Service: Open Heart Surgery;  Laterality: N/A;   Family History  Problem Relation Age of Onset  . Colon cancer Father 57  . Stroke Mother   . Diabetes Mother   . Heart disease Maternal Aunt   . Diabetes Maternal Uncle   . Stomach cancer Neg Hx   . Esophageal cancer Neg Hx   . Rectal cancer Neg Hx    Social History  Substance Use Topics  . Smoking status: Former Smoker    Packs/day: 1.00    Years: 47.00    Types: Cigarettes    Quit date: 01/24/2016  . Smokeless tobacco: Never Used  . Alcohol use 2.4 oz/week    4 Cans of beer per week    Review of Systems  Constitutional: Negative.   Respiratory: Negative.   Gastrointestinal: Negative.   Skin: Positive for wound.  Neurological: Negative.   All other systems reviewed and are negative.   Allergies  Sulfa antibiotics  Home Medications   Prior to Admission medications   Medication Sig Start Date End Date Taking? Authorizing Provider  allopurinol (ZYLOPRIM) 300 MG tablet Take 150 mg by mouth daily. 05/21/15   [provider]  ANDROGEL PUMP 20.25 MG/ACT (1.62%) GEL Apply 2 each topically daily. 01/09/16   [provider]  aspirin EC 81 MG tablet  Take 81 mg by mouth daily.    [provider]  atorvastatin (LIPITOR) 40 MG tablet Take 40 mg by mouth every evening. 06/11/15   [provider]  cephALEXin (KEFLEX) 500 MG capsule Take 1 capsule (500 mg total) by mouth 4 (four) times daily. 11/23/16   Janne Napoleon, NP  Cholecalciferol (VITAMIN D3) 2000 units TABS Take 2,000 Units by mouth daily.    [provider]  felodipine (PLENDIL) 10 MG 24 hr tablet Take 1 tablet (10 mg total) by mouth daily. 11/12/16   Skeet Latch, MD   fluticasone (FLOVENT HFA) 220 MCG/ACT inhaler Inhale 2 puffs into the lungs daily as needed (for shortness of breath).     [provider]  hydrocortisone cream 1 % Apply 1 application topically as needed for itching.    [provider]  metFORMIN (GLUCOPHAGE-XR) 500 MG 24 hr tablet Take 500 mg by mouth daily. 08/28/16   [provider]  metoprolol tartrate (LOPRESSOR) 25 MG tablet Take 0.5 tablets (12.5 mg total) by mouth 2 (two) times daily. 04/11/16   Barrett, Erin R, PA-C  OVER THE COUNTER MEDICATION Take 1 capsule by mouth 2 (two) times daily. ProbioticXL    [provider]  OVER THE COUNTER MEDICATION Take 2 capsules by mouth 2 (two) times daily. OmegaXL    [provider]  SitaGLIPtin-MetFORMIN HCl (JANUMET XR) 50-1000 MG TB24 Take 1 tablet by mouth every evening.    [provider]  SYMBICORT 160-4.5 MCG/ACT inhaler USE 1 PUFF TWICE A DAY 09/08/16   [provider]  valsartan-hydrochlorothiazide (DIOVAN-HCT) 320-12.5 MG per tablet Take 1 tablet by mouth daily.      [provider]   Meds Ordered and Administered this Visit  Medications - No data to display  BP 137/84 (BP Location: Right Arm)   Pulse 82   Temp 98.4 F (36.9 C)   Resp 18   SpO2 100%  No data found.   Physical Exam  Constitutional: He is oriented to person, place, and time. He appears well-developed and well-nourished. No distress.  Eyes: EOM are normal.  Neck: Neck supple.  Cardiovascular: Normal rate.   Pulmonary/Chest: Effort normal. No respiratory distress.  Musculoskeletal: He exhibits no edema.  There is a vesicle between the great and second toe. It is soft and fluctuant. The forefoot just proximal to the lesion has mild swelling and erythema. Distal neurovascular motor Sentry is otherwise grossly intact.  Neurological: He is alert and oriented to person, place, and time. He exhibits normal muscle tone.  Skin: Skin is warm and dry.   Psychiatric: He has a normal mood and affect.  Nursing note and vitals reviewed.   Urgent Care Course     Procedures (including critical care time)  Labs Review Labs Reviewed - No data to display  Imaging Review No results found.   Visual Acuity Review  Right Eye Distance:   Left Eye Distance:   Bilateral Distance:    Right Eye Near:   Left Eye Near:    Bilateral Near:         MDM   1. Cellulitis of right lower extremity   2. Blister    The blister between your toes was infected. It is causing an infection to the part of the foot just below the toe. Take the medication as directed. Use warm water soaks 2 or 3 times a day for the next couple days. Keep a dressing over the wound for 23 days until  healed. Otherwise, keep it dry at all other times. For any worsening new symptoms or problems follow-up with your primary care doctor or may return. Meds ordered this encounter  Medications  . cephALEXin (KEFLEX) 500 MG capsule    Sig: Take 1 capsule (500 mg total) by mouth 4 (four) times daily.    Dispense:  28 capsule    Refill:  0    Order Specific Question:   Supervising Provider    Answer:   Sherlene Shams [[id number]]       Janne Napoleon, NP 11/23/16 1037

## 2016-11-23 NOTE — ED Triage Notes (Signed)
Pt  Reports  Pain  And  Swelling  To  The  r  Foot  For several  Days  Denies   Any  Injury          Wears  Sox  All  The  Time        Appearance  Of  Something like  A  Blister  Between the  Toes

## 2016-12-04 ENCOUNTER — Other Ambulatory Visit: Payer: Self-pay | Admitting: Physician Assistant

## 2016-12-04 DIAGNOSIS — E119 Type 2 diabetes mellitus without complications: Secondary | ICD-10-CM | POA: Diagnosis not present

## 2016-12-04 DIAGNOSIS — R079 Chest pain, unspecified: Secondary | ICD-10-CM | POA: Diagnosis not present

## 2016-12-04 DIAGNOSIS — E291 Testicular hypofunction: Secondary | ICD-10-CM | POA: Diagnosis not present

## 2016-12-12 ENCOUNTER — Ambulatory Visit (HOSPITAL_COMMUNITY)
Admission: EM | Admit: 2016-12-12 | Discharge: 2016-12-12 | Disposition: A | Discharge status: Home / Self Care | Payer: Medicare PPO | Attending: Family Medicine | Admitting: Family Medicine

## 2016-12-12 ENCOUNTER — Encounter (HOSPITAL_COMMUNITY): Payer: Self-pay | Admitting: Emergency Medicine

## 2016-12-12 DIAGNOSIS — T783XXA Angioneurotic edema, initial encounter: Secondary | ICD-10-CM | POA: Diagnosis not present

## 2016-12-12 NOTE — ED Provider Notes (Signed)
CSN: [id number]     Arrival date & time 12/12/16  1938 History   None    Chief Complaint  Patient presents with  . Angioedema   (Consider location/radiation/quality/duration/timing/severity/associated sxs/prior Treatment) Patient c/o swelling to lips that started this afternoon.  He did take benadryl.   The history is provided by the patient.  Facial Injury  Location:  Face Pain details:    Severity:  Moderate Relieved by:  Nothing Worsened by:  Nothing Ineffective treatments:  None tried   Past Medical History:  Diagnosis Date  . Abnormal cardiovascular stress test 03/29/2016  . Allergic rhinitis   . Diabetes ([place]) 03/29/2016  . Gout   . Hyperlipidemia   . Hypertension   . Sleep apnea    cpap   Past Surgical History:  Procedure Laterality Date  . CARDIAC CATHETERIZATION N/A 03/29/2016   Procedure: Right/Left Heart Cath and Coronary Angiography;  Surgeon: Nelva Bush, MD;  Location: [facility] [place] [facility];  Service: Cardiovascular;  Laterality: N/A;  . CARDIAC CATHETERIZATION N/A 03/29/2016   Procedure: Intravascular Pressure Wire/FFR Study;  Surgeon: Nelva Bush, MD;  Location: [facility];  Service: Cardiovascular;  Laterality: N/A;  . CARDIAC CATHETERIZATION Left 04/02/2016   Procedure: FEMORAL ARTERIAL LINE INSERTION;  Surgeon: [name], MD;  Location: [facility];  Service: Open Heart Surgery;  Laterality: Left;  . COLONOSCOPY    . CORONARY ARTERY BYPASS GRAFT N/A 04/02/2016   Procedure: CORONARY ARTERY BYPASS GRAFTING (CABG) X 5 UTILIZING LEFT INTERNAL MAMMARY ARTERY AND ENDOSCOPICALLY HARVESTED GREATER SAPHENOUS VEIN ; Mammary to LAD, Sequencial 1st to 2nd Diagonal, Vein to OM, Vein to Distal RCA.;  Surgeon: [name], MD;  Location: [place];  Service: Open Heart Surgery;  Laterality: N/A;  . LYMPH NODE BIOPSY Left 04/02/2016   Procedure: INCIDENTAL LEFT MAMMARY LYMPH NODE BIOPSY;  Surgeon: [name], MD;  Location: [place];  Service: Open Heart  Surgery;  Laterality: Left;  . mass abdomen  1955  . TEE WITHOUT CARDIOVERSION N/A 04/02/2016   Procedure: TRANSESOPHAGEAL ECHOCARDIOGRAM (TEE);  Surgeon: [name], MD;  Location: [facility];  Service: Open Heart Surgery;  Laterality: N/A;   Family History  Problem Relation Age of Onset  . Colon cancer Father 65  . Stroke Mother   . Diabetes Mother   . Heart disease Maternal Aunt   . Diabetes Maternal Uncle   . Stomach cancer Neg Hx   . Esophageal cancer Neg Hx   . Rectal cancer Neg Hx    Social History  Substance Use Topics  . Smoking status: Former Smoker    Packs/day: 1.00    Years: 47.00    Types: Cigarettes    Quit date: 01/24/2016  . Smokeless tobacco: Never Used  . Alcohol use 2.4 oz/week    4 Cans of beer per week    Review of Systems  Constitutional: Negative.   HENT: Negative.   Eyes: Negative.   Respiratory: Negative.   Cardiovascular: Negative.   Gastrointestinal: Negative.   Endocrine: Negative.   Genitourinary: Negative.   Allergic/Immunologic: Negative.   Neurological: Negative.   Psychiatric/Behavioral: Negative.     Allergies  Sulfa antibiotics  Home Medications   Prior to Admission medications   Medication Sig Start Date End Date Taking? Authorizing Provider  allopurinol (ZYLOPRIM) 300 MG tablet Take 150 mg by mouth daily. 05/21/15   [provider]  ANDROGEL PUMP 20.25 MG/ACT (1.62%) GEL Apply 2 each topically daily. 01/09/16   [provider]  aspirin EC 81 MG tablet Take 81 mg by mouth daily.    [provider]  atorvastatin (LIPITOR) 40 MG tablet Take 40 mg by mouth every evening. 06/11/15   [provider]  cephALEXin (KEFLEX) 500 MG capsule Take 1 capsule (500 mg total) by mouth 4 (four) times daily. 11/23/16   Janne Napoleon, NP  Cholecalciferol (VITAMIN D3) 2000 units TABS Take 2,000 Units by mouth daily.    [provider]  felodipine (PLENDIL) 10 MG 24 hr tablet Take 1 tablet (10 mg total) by  mouth daily. 11/12/16   Skeet Latch, MD  fluticasone (FLOVENT HFA) 220 MCG/ACT inhaler Inhale 2 puffs into the lungs daily as needed (for shortness of breath).     [provider]  hydrocortisone cream 1 % Apply 1 application topically as needed for itching.    [provider]  metFORMIN (GLUCOPHAGE-XR) 500 MG 24 hr tablet Take 500 mg by mouth daily. 08/28/16   [provider]  metoprolol tartrate (LOPRESSOR) 25 MG tablet Take 0.5 tablets (12.5 mg total) by mouth 2 (two) times daily. 04/11/16   Barrett, Erin R, PA-C  OVER THE COUNTER MEDICATION Take 1 capsule by mouth 2 (two) times daily. ProbioticXL    [provider]  OVER THE COUNTER MEDICATION Take 2 capsules by mouth 2 (two) times daily. OmegaXL    [provider]  SitaGLIPtin-MetFORMIN HCl (JANUMET XR) 50-1000 MG TB24 Take 1 tablet by mouth every evening.    [provider]  SYMBICORT 160-4.5 MCG/ACT inhaler USE 1 PUFF TWICE A DAY 09/08/16   [provider]  valsartan-hydrochlorothiazide (DIOVAN-HCT) 320-12.5 MG per tablet Take 1 tablet by mouth daily.      [provider]   Meds Ordered and Administered this Visit  Medications - No data to display  BP (!) 159/83 (BP Location: Right Arm)   Pulse 71   Temp 98.2 F (36.8 C) (Oral)   Resp 20   SpO2 98%  No data found.   Physical Exam  Constitutional: He appears well-developed and well-nourished.  HENT:  Head: Normocephalic and atraumatic.  Right Ear: External ear normal.  Left Ear: External ear normal.  Mouth/Throat: Oropharynx is clear and moist.  Lips and periorbital region with swelling.  Eyes: Conjunctivae are normal. Pupils are equal, round, and reactive to light.  Neck: Normal range of motion. Neck supple.  Cardiovascular: Normal rate, regular rhythm and normal heart sounds.   Pulmonary/Chest: Effort normal and breath sounds normal.  Nursing note and vitals reviewed.   Urgent Care Course      Procedures (including critical care time)  Labs Review Labs Reviewed - No data to display  Imaging Review No results found.   Visual Acuity Review  Right Eye Distance:   Left Eye Distance:   Bilateral Distance:    Right Eye Near:   Left Eye Near:    Bilateral Near:         MDM   1. Angioedema, initial encounter    Stop Diovan and continue benadryl otc as directed      Lysbeth Penner, FNP 12/12/16 2015    Lysbeth Penner, FNP 12/12/16 2017

## 2016-12-12 NOTE — Discharge Instructions (Signed)
Stop taking the Diovan HCT blood pressure medicine and follow up with your pcp. If your swelling becomes worse or if you have any airway swelling then call 911 or go to the Emergency Room.

## 2016-12-12 NOTE — ED Triage Notes (Signed)
The patient presented to the [facility] with a complaint of swelling to his lips that started this afternoon. The patient reported that he took two PO benadryl at 1700 hours.

## 2016-12-17 DIAGNOSIS — R079 Chest pain, unspecified: Secondary | ICD-10-CM | POA: Diagnosis not present

## 2016-12-17 DIAGNOSIS — E291 Testicular hypofunction: Secondary | ICD-10-CM | POA: Diagnosis not present

## 2016-12-17 DIAGNOSIS — E119 Type 2 diabetes mellitus without complications: Secondary | ICD-10-CM | POA: Diagnosis not present

## 2016-12-27 DIAGNOSIS — G4733 Obstructive sleep apnea (adult) (pediatric): Secondary | ICD-10-CM | POA: Diagnosis not present

## 2017-01-02 DIAGNOSIS — Z0001 Encounter for general adult medical examination with abnormal findings: Secondary | ICD-10-CM | POA: Diagnosis not present

## 2017-01-22 DIAGNOSIS — E119 Type 2 diabetes mellitus without complications: Secondary | ICD-10-CM | POA: Diagnosis not present

## 2017-01-22 DIAGNOSIS — M2550 Pain in unspecified joint: Secondary | ICD-10-CM | POA: Diagnosis not present

## 2017-01-22 DIAGNOSIS — E7800 Pure hypercholesterolemia, unspecified: Secondary | ICD-10-CM | POA: Diagnosis not present

## 2017-01-22 DIAGNOSIS — E291 Testicular hypofunction: Secondary | ICD-10-CM | POA: Diagnosis not present

## 2017-01-22 DIAGNOSIS — I10 Essential (primary) hypertension: Secondary | ICD-10-CM | POA: Diagnosis not present

## 2017-03-05 IMAGING — CR DG CHEST 2V
2 series · 2 of 2 positions shown · non-contrast
Comparison: April 08, 2016

CLINICAL DATA: Recent coronary artery bypass grafting for coronary
artery disease

EXAM:
CHEST  2 VIEW

[w chest pa]
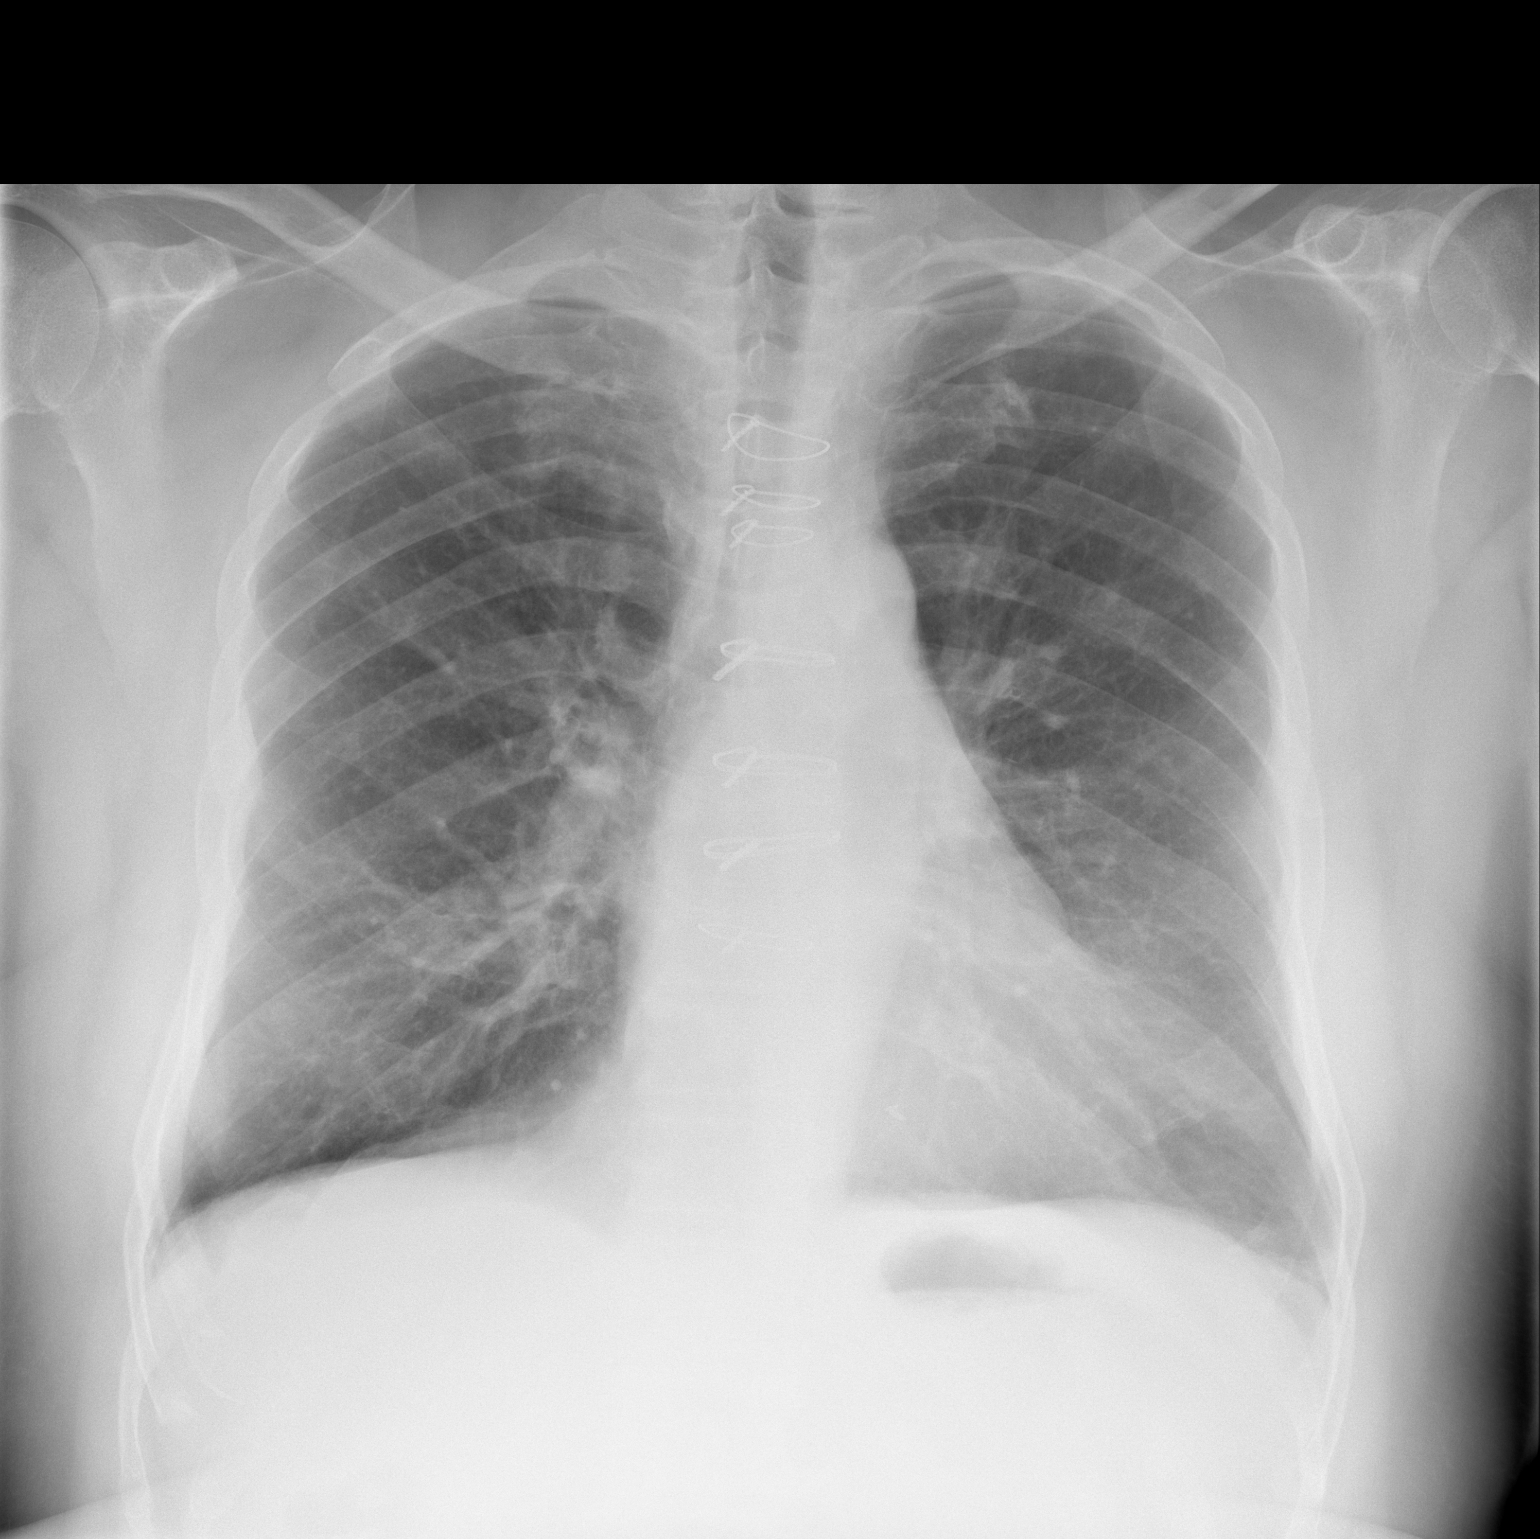

[w chest lat]
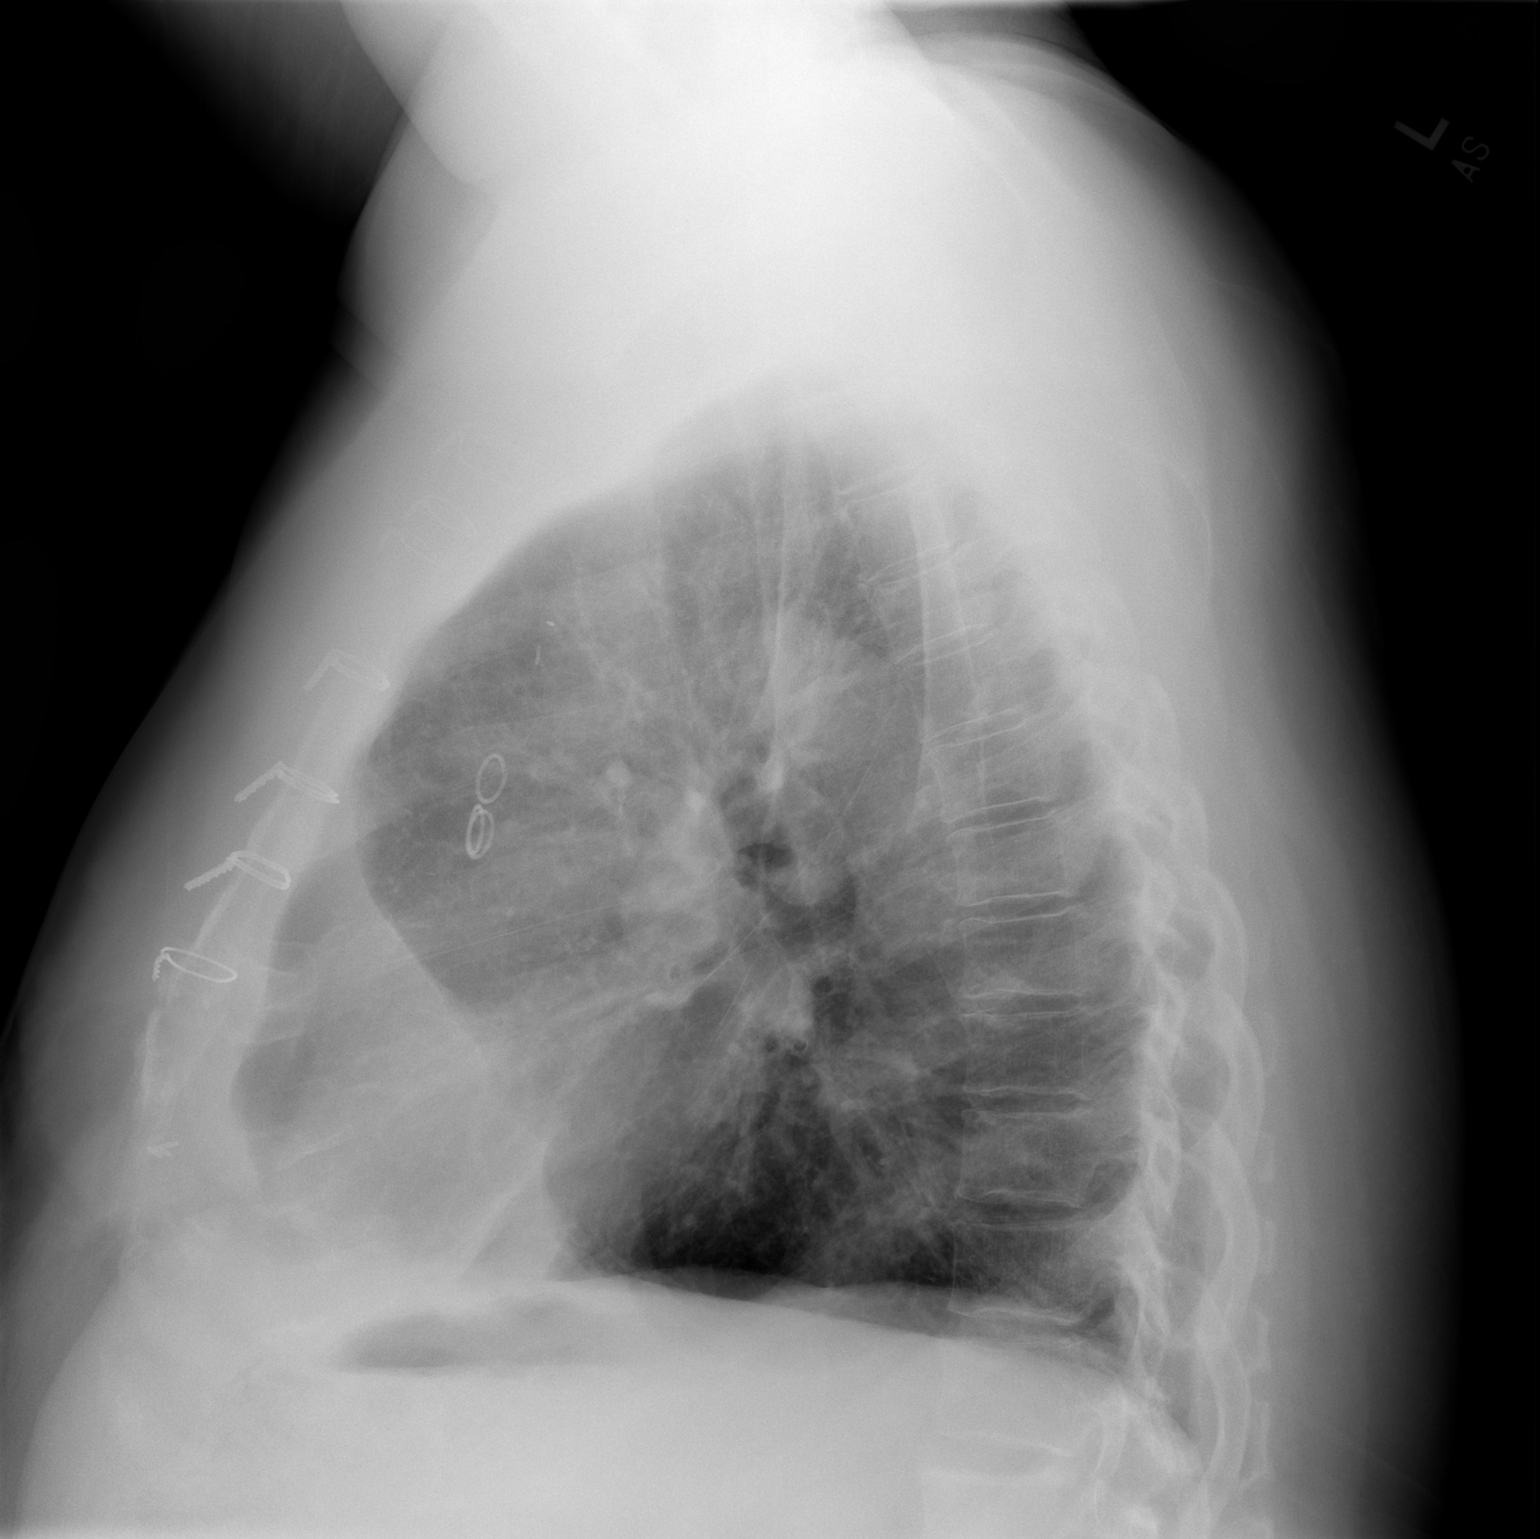

[2 of 2 positions shown; findings below may reference images not displayed]

FINDINGS: There is no edema or consolidation. Lungs are mildly hyperexpanded.
The heart size and pulmonary vascularity are normal. No adenopathy.
No pneumothorax. No bone lesions. Patient is status post coronary
artery bypass grafting.
IMPRESSION: Mild hyperexpansion without edema or consolidation.

## 2017-03-25 ENCOUNTER — Other Ambulatory Visit: Payer: Self-pay | Admitting: Pharmacist

## 2017-03-25 NOTE — Patient Outreach (Signed)
Outreach call to David Brandt to discuss medication adherence to patient's metoprolol ER 50 mg for [facility] quality metric. Speak with patient. HIPAA identifiers verified and verbal consent received.  Patient denies any missed doses of this medication. Reports that he uses a weekly pillbox to ensure that he does not miss any doses of his medications and states that he takes this medication each morning as directed. Counseled on the importance of medication adherence.  Harlow Asa, PharmD, [facility] [phone]

## 2017-09-05 ENCOUNTER — Encounter: Payer: Self-pay | Admitting: Internal Medicine

## 2017-11-01 ENCOUNTER — Other Ambulatory Visit: Payer: Self-pay | Admitting: Cardiovascular Disease

## 2017-11-01 NOTE — Telephone Encounter (Signed)
Rx(s) sent to pharmacy electronically.  

## 2017-12-04 ENCOUNTER — Ambulatory Visit: Payer: Medicare PPO | Admitting: Family Medicine

## 2017-12-04 ENCOUNTER — Encounter: Payer: Self-pay | Admitting: Family Medicine

## 2017-12-04 VITALS — BP 120/68 | HR 96 | Temp 98.7°F | Ht 69.0 in | Wt 322.8 lb

## 2017-12-04 DIAGNOSIS — Z951 Presence of aortocoronary bypass graft: Secondary | ICD-10-CM | POA: Diagnosis not present

## 2017-12-04 DIAGNOSIS — Z86010 Personal history of colonic polyps: Secondary | ICD-10-CM | POA: Diagnosis not present

## 2017-12-04 DIAGNOSIS — G4733 Obstructive sleep apnea (adult) (pediatric): Secondary | ICD-10-CM

## 2017-12-04 DIAGNOSIS — E785 Hyperlipidemia, unspecified: Secondary | ICD-10-CM | POA: Diagnosis not present

## 2017-12-04 DIAGNOSIS — E1169 Type 2 diabetes mellitus with other specified complication: Secondary | ICD-10-CM | POA: Insufficient documentation

## 2017-12-04 DIAGNOSIS — J449 Chronic obstructive pulmonary disease, unspecified: Secondary | ICD-10-CM | POA: Diagnosis not present

## 2017-12-04 DIAGNOSIS — I10 Essential (primary) hypertension: Secondary | ICD-10-CM

## 2017-12-04 MED ORDER — FELODIPINE ER 10 MG PO TB24: 10.0000 mg | ORAL_TABLET | Freq: Every day | ORAL | 3 refills | Status: DC

## 2017-12-04 MED ORDER — ATORVASTATIN CALCIUM 40 MG PO TABS: 40.0000 mg | ORAL_TABLET | Freq: Every evening | ORAL | 3 refills | Status: DC

## 2017-12-04 MED ORDER — SYMBICORT 160-4.5 MCG/ACT IN AERO: 2.0000 | INHALATION_SPRAY | Freq: Two times a day (BID) | RESPIRATORY_TRACT | 3 refills | Status: DC

## 2017-12-04 MED ORDER — METFORMIN HCL ER 500 MG PO TB24: 500.0000 mg | ORAL_TABLET | Freq: Every day | ORAL | 3 refills | Status: DC

## 2017-12-04 MED ORDER — METOPROLOL SUCCINATE ER 50 MG PO TB24: 50.0000 mg | ORAL_TABLET | Freq: Every day | ORAL | 3 refills | Status: DC

## 2017-12-04 NOTE — Progress Notes (Signed)
   Subjective:    Patient ID: David Brandt, male    DOB: [birth year], 72 y.o.   MRN: [id number]  HPI 72 yr old male to establish with [name] for primary care. He had been seeing Dr. Jeanann Lewandowsky, who recently retired. His daughter is a Marine scientist here at our clinic. He is doing well at the moment, and he has no complaints. His last complete physical was in October 2018. This was the time his last A1c was checked. He does not remember the value, but he says it was "good". His am fasting glucoses have been averaging 85 to 105. His BP has been stable at home. He is past due to follow up with Dr. Oval Linsey, his cardiologist. He was due for a follow up colonoscopy in March. In October 2017 he underwent a 5 vessel CABG for severe 3 vessel disease, and this went well. He has no stents. He takes a 325 mg aspirin daily.    Review of Systems  Constitutional: Negative.   Respiratory: Negative.   Cardiovascular: Negative.   Gastrointestinal: Negative.   Genitourinary: Negative.   Neurological: Negative.        Objective:   Physical Exam  Constitutional: He is oriented to person, place, and time.  Morbidly obese   Neck: No thyromegaly present.  Cardiovascular: Normal rate, regular rhythm, normal heart sounds and intact distal pulses.  Pulmonary/Chest: Effort normal and breath sounds normal. No stridor. No respiratory distress. He has no wheezes. He has no rales.  Lymphadenopathy:    He has no cervical adenopathy.  Neurological: He is alert and oriented to person, place, and time.          Assessment & Plan:  Intro visit for this man with CAD, dyslipidemia, COPD, type 2 diabetes, and HTN. He needs refills on Androgel, but he thinks his insurance company has stopped covering this. I asked him to contact them to find out what testosterone supplements they will cover. We will arrange for the GI office to contact him about setting up a colonoscopy. We will arrange for him to see Dr. Oval Linsey again. He  will sign for [name] to be able to get his medical records sent over from Dr. Ainsley Spinner office. Check an A1c.  Alysia Penna, MD

## 2017-12-05 ENCOUNTER — Encounter: Payer: Self-pay | Admitting: Family Medicine

## 2017-12-05 ENCOUNTER — Telehealth: Payer: Self-pay | Admitting: Family Medicine

## 2017-12-05 ENCOUNTER — Encounter: Payer: Self-pay | Admitting: Internal Medicine

## 2017-12-05 NOTE — Telephone Encounter (Signed)
Copied from [place] [id number]. Topic: Inquiry >> Dec 05, 2017  1:10 PM Oliver Pila B wrote: Reason for CRM: pt has talked to insurance and was notified that the insurance will pay for CYPIONATE; call pt if needed

## 2017-12-06 ENCOUNTER — Other Ambulatory Visit (INDEPENDENT_AMBULATORY_CARE_PROVIDER_SITE_OTHER): Payer: Medicare PPO

## 2017-12-06 DIAGNOSIS — E1169 Type 2 diabetes mellitus with other specified complication: Secondary | ICD-10-CM

## 2017-12-06 LAB — HEMOGLOBIN A1C: Hgb A1c MFr Bld: 6.8 % — ABNORMAL HIGH (ref 4.6–6.5)

## 2017-12-06 NOTE — Telephone Encounter (Signed)
Noted  

## 2017-12-09 ENCOUNTER — Encounter: Payer: Self-pay | Admitting: Cardiovascular Disease

## 2017-12-09 ENCOUNTER — Ambulatory Visit: Payer: Medicare PPO | Admitting: Cardiovascular Disease

## 2017-12-09 VITALS — BP 134/80 | HR 68 | Ht 69.0 in | Wt 326.0 lb

## 2017-12-09 DIAGNOSIS — Z951 Presence of aortocoronary bypass graft: Secondary | ICD-10-CM

## 2017-12-09 DIAGNOSIS — I10 Essential (primary) hypertension: Secondary | ICD-10-CM | POA: Diagnosis not present

## 2017-12-09 DIAGNOSIS — R609 Edema, unspecified: Secondary | ICD-10-CM

## 2017-12-09 DIAGNOSIS — R0602 Shortness of breath: Secondary | ICD-10-CM | POA: Diagnosis not present

## 2017-12-09 DIAGNOSIS — E7800 Pure hypercholesterolemia, unspecified: Secondary | ICD-10-CM | POA: Diagnosis not present

## 2017-12-09 DIAGNOSIS — Z5181 Encounter for therapeutic drug level monitoring: Secondary | ICD-10-CM

## 2017-12-09 NOTE — Progress Notes (Signed)
Cardiology Office Note   Date:  12/09/2017   ID:  David Brandt, DOB June 24, 1946, MRN [id number]  PCP:  [name], MD  Cardiologist:   Skeet Latch, MD   Chief Complaint  Patient presents with  . Follow-up     History of Present Illness: David Brandt is a 72 y.o. male with CAD s/p CABG (LIMA-->LAD, SVG-->D1,D2, SVG-->OM, SVG-->RCA) , diabetes, OSA on CPAP, post-operative atrial fibrillation, hypertension, and hyperlipidemia who presents for follow.   David Brandt initially presented with chest pressure and shortness of breath 03/2016.  He underwent Lexiscan Myoview on 03/28/16 that revealed LVEF 48% with a large inferior and inferior lateral MI and no evidence of ischemia. There was also basal to mid inferior wall akinesis.  He subsequently underwent left heart catheterization where he was found to have severe three-vessel CAD.  He underwent CABG with Dr. Servando Snare on 04/02/16.  He developed post-operative atrial fibrillation and was started on amiodarone.  There were were no other postoperative complications and he was discharged 04/11/16.  Echo 03/30/16 revealed LVEF 55-60% and was otherwise unremarkable.   David Brandt followed up with Dr. Servando Snare and reduced amiodarone to 200mg  04/2016.  This was discontinued 07/2016.  Since his last appointment David Brandt has been feeling well.  He exercises at [employer] with a trainer 3 days per week.  He has no chest pain or shortness of breath with this activity.  He feels better after exercises and has more energy.  He has mild orthopnea and sometimes has to use his inhalers upon awakening.  He denies PND.  He has been eating more and his weight has been going up.     Past Medical History:  Diagnosis Date  . Abnormal cardiovascular stress test 03/29/2016  . Allergic rhinitis   . Diabetes ([place]) 03/29/2016  . Gout   . Hyperlipidemia   . Hypertension   . Sleep apnea    cpap    Past Surgical History:  Procedure  Laterality Date  . CARDIAC CATHETERIZATION N/A 03/29/2016   Procedure: Right/Left Heart Cath and Coronary Angiography;  Surgeon: Nelva Bush, MD;  Location: [place] [facility];  Service: Cardiovascular;  Laterality: N/A;  . CARDIAC CATHETERIZATION N/A 03/29/2016   Procedure: Intravascular Pressure Wire/FFR Study;  Surgeon: Nelva Bush, MD;  Location: [place] [facility];  Service: Cardiovascular;  Laterality: N/A;  . CARDIAC CATHETERIZATION Left 04/02/2016   Procedure: FEMORAL ARTERIAL LINE INSERTION;  Surgeon: [name], MD;  Location: [place];  Service: Open Heart Surgery;  Laterality: Left;  . COLONOSCOPY  09/14/2014   per Dr. Carlean Purl, adenomatous polyps, repeat in 3 yrs   . CORONARY ARTERY BYPASS GRAFT N/A 04/02/2016   Procedure: CORONARY ARTERY BYPASS GRAFTING (CABG) X 5 UTILIZING LEFT INTERNAL MAMMARY ARTERY AND ENDOSCOPICALLY HARVESTED GREATER SAPHENOUS VEIN ; Mammary to LAD, Sequencial 1st to 2nd Diagonal, Vein to OM, Vein to Distal RCA.;  Surgeon: [name], MD;  Location: [place];  Service: Open Heart Surgery;  Laterality: N/A;  . LYMPH NODE BIOPSY Left 04/02/2016   Procedure: INCIDENTAL LEFT MAMMARY LYMPH NODE BIOPSY;  Surgeon: [name], MD;  Location: [place];  Service: Open Heart Surgery;  Laterality: Left;  . mass abdomen  1955  . TEE WITHOUT CARDIOVERSION N/A 04/02/2016   Procedure: TRANSESOPHAGEAL ECHOCARDIOGRAM (TEE);  Surgeon: [name], MD;  Location: [place];  Service: Open Heart Surgery;  Laterality: N/A;     Current Outpatient Medications  Medication Sig Dispense  Refill  . ANDROGEL PUMP 20.25 MG/ACT (1.62%) GEL Apply 2 each topically daily.    Marland Kitchen aspirin EC 81 MG tablet Take 81 mg by mouth daily.    Marland Kitchen atorvastatin (LIPITOR) 40 MG tablet Take 1 tablet (40 mg total) by mouth every evening. 90 tablet 3  . Cholecalciferol (VITAMIN D3) 2000 units TABS Take 2,000 Units by mouth daily.    . felodipine (PLENDIL) 10 MG 24 hr tablet Take 1 tablet (10 mg  total) by mouth daily. <PLEASE MAKE APPOINTMENT FOR REFILLS> 90 tablet 3  . metFORMIN (GLUCOPHAGE-XR) 500 MG 24 hr tablet Take 1 tablet (500 mg total) by mouth daily. 90 tablet 3  . metoprolol succinate (TOPROL-XL) 50 MG 24 hr tablet Take 1 tablet (50 mg total) by mouth daily. Take with or immediately following a meal. 90 tablet 3  . OVER THE COUNTER MEDICATION Take 2 capsules by mouth 2 (two) times daily. OmegaXL    . SYMBICORT 160-4.5 MCG/ACT inhaler Inhale 2 puffs into the lungs 2 (two) times daily. 3 Inhaler 3   No current facility-administered medications for this visit.     Allergies:   Allopurinol; Valsartan-hydrochlorothiazide; and Sulfa antibiotics    Social History:  The patient  reports that he quit smoking about 22 months ago. His smoking use included cigarettes. He has a 47.00 pack-year smoking history. He has never used smokeless tobacco. He reports that he drinks about 2.4 oz of alcohol per week. He reports that he has current or past drug history. Drug: Marijuana. Frequency: 7.00 times per week.   Family History:  [name] family history includes Colon cancer (age of onset: 62) in his father; Diabetes in his maternal uncle and mother; Heart disease in his maternal aunt; Stroke in his mother.    ROS:  Please see the history of present illness.   Otherwise, review of systems are positive for none.   All other systems are reviewed and negative.    PHYSICAL EXAM: VS:  BP 134/80   Pulse 68   Ht 5\' 9"  (1.753 m)   Wt (!) 326 lb (147.9 kg)   BMI 48.14 kg/m  , BMI Body mass index is 48.14 kg/m. GENERAL:  Well appearing. No acute distress. HEENT: Pupils equal round and reactive, fundi not visualized, oral mucosa unremarkable NECK:  No jugular venous distention, waveform within normal limits, carotid upstroke brisk and symmetric, no bruits LUNGS:  Clear to auscultation bilaterally HEART:  RRR.  PMI not displaced or sustained,S1 and S2 within normal limits, no S3, no S4, no  clicks, no rubs, no murmurs ABD:  Central adiposity.  Positive bowel sounds normal in frequency in pitch, no bruits, no rebound, no guarding, no midline pulsatile mass, no hepatomegaly, no splenomegaly EXT:  2 plus pulses throughout, 1+ pitting edema bilaterally, no cyanosis no clubbing SKIN:  No rashes no nodules NEURO:  Cranial nerves II through XII grossly intact, motor grossly intact throughout PSYCH:  Cognitively intact, oriented to person place and time   EKG:  EKG is ordered today. The ekg ordered 05/02/16 demonstrates sinus rhythm. Rate 65 bpm.  12/09/17: Sinus rhythm.  Rate 68 bpm.  Prior septal infarct.  Inferolateral TWI  Lexiscan Myoview 03/29/16: Nuclear stress EF: 48%.  The left ventricular ejection fraction is mildly decreased (45-54%).   Large inferior and inferior lateral wall MI from apex to base with no ischemia EF 48% with mid and basal inferior wall akinesis   LHC 03/29/16: Conclusions: 1. Severe 3 vessel coronary artery  disease, as detailed below, including 50% distal LMCA/ostial LAD stenoses (FFR 0.70), 80% D1 lesion, 80% ostial LCx stenosis, and diffusely diseased RCA with 99% mid RCA stenosis with TIMI-1 flow and competitive filling of the PDA and rPL branches by left-to-right collaterals. 2. Mildly decreased LV systolic function with inferior akinesis (LVEF 45-50%) 3. Upper normal to mildly elevated left heart filling pressures. 4. Reduced Fick cardiac output.   Echo  03/30/16:  Study Conclusions  - Left ventricle: The cavity size was normal. Wall thickness was   increased in a pattern of moderate LVH. Systolic function was   normal. The estimated ejection fraction was in the range of 55%   to 60%. Left ventricular diastolic function parameters were   normal. - Left atrium: The atrium was mildly dilated. - Atrial septum: No defect or patent foramen ovale was identified. - Pulmonary arteries: PA peak pressure: 34 mm Hg (S).  Recent Labs: No results found  for requested labs within last 8760 hours.    Lipid Panel    Component Value Date/Time   CHOL 126 03/30/2016 0156   TRIG 296 (H) 03/30/2016 0156   HDL 31 (L) 03/30/2016 0156   CHOLHDL 4.1 03/30/2016 0156   VLDL 59 (H) 03/30/2016 0156   LDLCALC 36 03/30/2016 0156      Wt Readings from Last 3 Encounters:  12/09/17 (!) 326 lb (147.9 kg)  12/04/17 (!) 322 lb 12.8 oz (146.4 kg)  11/12/16 291 lb 12.8 oz (132.4 kg)      ASSESSMENT AND PLAN:  # CAD s/p CABG:  # Hyperlipidemia:   Mr. Cowger is doing well and has no angina.  Continue aspirin, atorvastatin, and metoporlol.  He was encouraged to increase his exercise.  He will return for fasting lipids and CMP.  Reduce aspirin to 81 mg daily.  Continue atorvastatin.  # Hypertension:  Blood pressure is mildly above goal.  He was encouraged to start doing cardio in addition to strength training and work on his diet.    # Post-op atrial fibrillation: No recurrence since stopping amiodarone.  Continue metoprolol.  # OSA: Encouraged continued use of CPAP.  # Shortness of breath: # Edema: May be 2/2 felodipine.  JVD difficult to assess.  Will check echo and BNP.  Consider diuretic based on volume/HF assessment.    Current medicines are reviewed at length with the patient today.  The patient does not have concerns regarding medicines.  The following changes have been made:  no change  Labs/ tests ordered today include:   Orders Placed This Encounter  Procedures  . CBC with Differential/Platelet  . Lipid panel  . Comprehensive metabolic panel  . Pro b natriuretic peptide (BNP)  . EKG 12-Lead  . ECHOCARDIOGRAM COMPLETE     Disposition:   FU with Mariame Rybolt C. Oval Linsey, MD, [facility] in 3 months.      Signed, Syair Fricker C. Oval Linsey, MD, [facility]  12/09/2017 4:29 PM     [facility]

## 2017-12-09 NOTE — Patient Instructions (Signed)
Medication Instructions:  DECREASE YOUR ASPIRIN TO 81 MG DAILY   Labwork: FASTING LP/CMET/CBC/BNP TOMORROW  Testing/Procedures: Your physician has requested that you have an echocardiogram. Echocardiography is a painless test that uses sound waves to create images of your heart. It provides your doctor with information about the size and shape of your heart and how well your heart's chambers and valves are working. This procedure takes approximately one hour. There are no restrictions for this procedure. [address] [address]  Follow-Up: Your physician recommends that you schedule a follow-up appointment in: 3 MONTHS   If you need a refill on your cardiac medications before your next appointment, please call your pharmacy.

## 2017-12-10 LAB — PRO B NATRIURETIC PEPTIDE: NT-PRO BNP: 227 pg/mL (ref 0–376)

## 2017-12-10 LAB — LIPID PANEL
CHOL/HDL RATIO: 4.2 ratio (ref 0.0–5.0)
Cholesterol, Total: 177 mg/dL (ref 100–199)
HDL: 42 mg/dL (ref >39)
LDL CALC: 85 mg/dL (ref 0–99)
Triglycerides: 248 mg/dL — ABNORMAL HIGH (ref 0–149)
VLDL CHOLESTEROL CAL: 50 mg/dL — AB (ref 5–40)

## 2017-12-10 LAB — CBC WITH DIFFERENTIAL/PLATELET
BASOS: 0 %
Basophils Absolute: 0 10*3/uL (ref 0.0–0.2)
EOS (ABSOLUTE): 0.4 10*3/uL (ref 0.0–0.4)
EOS: 4 %
HEMATOCRIT: 48.6 % (ref 37.5–51.0)
HEMOGLOBIN: 16.1 g/dL (ref 13.0–17.7)
IMMATURE GRANULOCYTES: 0 %
Immature Grans (Abs): 0 10*3/uL (ref 0.0–0.1)
Lymphocytes Absolute: 2.3 10*3/uL (ref 0.7–3.1)
Lymphs: 23 %
MCH: 29.9 pg (ref 26.6–33.0)
MCHC: 33.1 g/dL (ref 31.5–35.7)
MCV: 90 fL (ref 79–97)
Monocytes Absolute: 1.1 10*3/uL — ABNORMAL HIGH (ref 0.1–0.9)
Monocytes: 11 %
NEUTROS PCT: 62 %
Neutrophils Absolute: 6.3 10*3/uL (ref 1.4–7.0)
Platelets: 228 10*3/uL (ref 150–450)
RBC: 5.39 x10E6/uL (ref 4.14–5.80)
RDW: 14.6 % (ref 12.3–15.4)
WBC: 10.1 10*3/uL (ref 3.4–10.8)

## 2017-12-10 LAB — COMPREHENSIVE METABOLIC PANEL
ALBUMIN: 4.2 g/dL (ref 3.5–4.8)
ALT: 22 IU/L (ref 0–44)
AST: 18 IU/L (ref 0–40)
Albumin/Globulin Ratio: 1.8 (ref 1.2–2.2)
Alkaline Phosphatase: 127 IU/L — ABNORMAL HIGH (ref 39–117)
BILIRUBIN TOTAL: 0.4 mg/dL (ref 0.0–1.2)
BUN / CREAT RATIO: 15 (ref 10–24)
BUN: 15 mg/dL (ref 8–27)
CHLORIDE: 99 mmol/L (ref 96–106)
CO2: 27 mmol/L (ref 20–29)
CREATININE: 0.97 mg/dL (ref 0.76–1.27)
Calcium: 9.8 mg/dL (ref 8.6–10.2)
GFR calc non Af Amer: 79 mL/min/{1.73_m2} (ref >59)
GFR, EST AFRICAN AMERICAN: 91 mL/min/{1.73_m2} (ref >59)
GLUCOSE: 117 mg/dL — AB (ref 65–99)
Globulin, Total: 2.3 g/dL (ref 1.5–4.5)
Potassium: 4.4 mmol/L (ref 3.5–5.2)
Sodium: 140 mmol/L (ref 134–144)
TOTAL PROTEIN: 6.5 g/dL (ref 6.0–8.5)

## 2017-12-12 ENCOUNTER — Other Ambulatory Visit: Payer: Self-pay

## 2017-12-12 DIAGNOSIS — Z79899 Other long term (current) drug therapy: Secondary | ICD-10-CM

## 2017-12-12 DIAGNOSIS — I10 Essential (primary) hypertension: Secondary | ICD-10-CM

## 2017-12-12 DIAGNOSIS — E785 Hyperlipidemia, unspecified: Secondary | ICD-10-CM

## 2017-12-12 MED ORDER — ATORVASTATIN CALCIUM 80 MG PO TABS: 40.0000 mg | ORAL_TABLET | Freq: Every evening | ORAL | 5 refills | Status: DC

## 2017-12-17 ENCOUNTER — Other Ambulatory Visit: Payer: Self-pay

## 2017-12-17 ENCOUNTER — Ambulatory Visit (HOSPITAL_COMMUNITY): Payer: Medicare PPO | Attending: Cardiovascular Disease

## 2017-12-17 DIAGNOSIS — R609 Edema, unspecified: Secondary | ICD-10-CM | POA: Diagnosis not present

## 2017-12-17 DIAGNOSIS — Z6841 Body Mass Index (BMI) 40.0 and over, adult: Secondary | ICD-10-CM | POA: Insufficient documentation

## 2017-12-17 DIAGNOSIS — I10 Essential (primary) hypertension: Secondary | ICD-10-CM | POA: Diagnosis not present

## 2017-12-17 DIAGNOSIS — R0602 Shortness of breath: Secondary | ICD-10-CM | POA: Diagnosis not present

## 2017-12-17 DIAGNOSIS — E119 Type 2 diabetes mellitus without complications: Secondary | ICD-10-CM | POA: Insufficient documentation

## 2017-12-17 DIAGNOSIS — E785 Hyperlipidemia, unspecified: Secondary | ICD-10-CM | POA: Insufficient documentation

## 2017-12-31 ENCOUNTER — Telehealth: Payer: Self-pay

## 2017-12-31 ENCOUNTER — Telehealth: Payer: Self-pay | Admitting: *Deleted

## 2017-12-31 DIAGNOSIS — E291 Testicular hypofunction: Secondary | ICD-10-CM

## 2017-12-31 NOTE — Telephone Encounter (Signed)
Testosterone not on current med list and no recent labs. He may need script sent in-Dr. [name] asked him to check and see what his insurance covered at last visit.

## 2017-12-31 NOTE — Telephone Encounter (Signed)
Fax from [place]   Pt is requesting a refill on the following:  HCTZ 12.5 MG   Medication is not listed on pt's current medication list   Last OV 12/04/2017   Call pt to verify this medication if needed

## 2017-12-31 NOTE — Telephone Encounter (Signed)
Copied from [place] [id number]. Topic: Inquiry >> Dec 30, 2017 12:00 PM Conception Chancy, NT wrote: Reason for CRM: patient is calling and states he contacted his insurance company and they said they would cover his testosterone injections.

## 2018-01-01 ENCOUNTER — Other Ambulatory Visit (INDEPENDENT_AMBULATORY_CARE_PROVIDER_SITE_OTHER): Payer: Medicare PPO

## 2018-01-01 DIAGNOSIS — E291 Testicular hypofunction: Secondary | ICD-10-CM

## 2018-01-01 LAB — TESTOSTERONE: TESTOSTERONE: 238.51 ng/dL — AB (ref 300.00–890.00)

## 2018-01-01 MED ORDER — HYDROCHLOROTHIAZIDE 12.5 MG PO CAPS: 12.5000 mg | ORAL_CAPSULE | Freq: Every day | ORAL | 3 refills | Status: DC

## 2018-01-01 NOTE — Telephone Encounter (Signed)
Yes I would like to get a baseline testosterone level first. I put in the order, so he can make a lab appt

## 2018-01-01 NOTE — Telephone Encounter (Signed)
Pt is requesting for this medication and states that insurance will be covered. Would you like for the pt to come in to have their testosterone level tested first before we send this medication in?

## 2018-01-01 NOTE — Telephone Encounter (Signed)
Rx has been sent into pt's pharmacy and pt has been advised.

## 2018-01-01 NOTE — Telephone Encounter (Signed)
Call in HCTZ 12.5 mg to take daily, #90 with 3 rf

## 2018-01-01 NOTE — Telephone Encounter (Signed)
Pt advised and has been scheduled for a lab appt today

## 2018-01-01 NOTE — Telephone Encounter (Signed)
Called and spoke with pt. Pt stated that he is out of this medication. The last time that I see that this was refilled for him was in 2017 looks like in the past he was on valsartan-hctz but this medication made his lips well. Pt did not have the bottle at home to see who last prescribed the medication pt just kept saying it is a "small pink pill"  Should pt be taking  HCTZ?  Sent to PCP to advise

## 2018-01-03 ENCOUNTER — Other Ambulatory Visit: Payer: Self-pay

## 2018-01-03 MED ORDER — TESTOSTERONE CYPIONATE 200 MG/ML IM SOLN: 200.0000 mg | INTRAMUSCULAR | 1 refills | Status: DC

## 2018-01-03 NOTE — Telephone Encounter (Signed)
This is low as expected. Call in Testosterone cypionate 200 mg/ml to inject 1 ml IM every 14 days, 90 day supply with one rf. Also syringes and needles

## 2018-01-03 NOTE — Telephone Encounter (Signed)
Rx has been called into pt's pharmacy.  

## 2018-02-06 ENCOUNTER — Ambulatory Visit (AMBULATORY_SURGERY_CENTER): Payer: Self-pay

## 2018-02-06 VITALS — Ht 69.0 in | Wt 324.0 lb

## 2018-02-06 DIAGNOSIS — Z86010 Personal history of colonic polyps: Secondary | ICD-10-CM

## 2018-02-06 NOTE — Progress Notes (Signed)
Denies allergies to eggs or soy products. Denies complication of anesthesia or sedation. Denies use of weight loss medication. Denies use of O2.   Emmi instructions declined.  

## 2018-02-18 ENCOUNTER — Other Ambulatory Visit: Payer: Self-pay | Admitting: Cardiovascular Disease

## 2018-02-18 NOTE — Telephone Encounter (Signed)
Rx sent to pharmacy   

## 2018-02-20 ENCOUNTER — Ambulatory Visit (AMBULATORY_SURGERY_CENTER): Payer: Medicare PPO | Admitting: Internal Medicine

## 2018-02-20 ENCOUNTER — Encounter: Payer: Self-pay | Admitting: Internal Medicine

## 2018-02-20 VITALS — BP 148/84 | HR 66 | Temp 98.6°F | Resp 17 | Ht 69.0 in | Wt 326.0 lb

## 2018-02-20 DIAGNOSIS — Z86010 Personal history of colonic polyps: Secondary | ICD-10-CM | POA: Diagnosis not present

## 2018-02-20 HISTORY — PX: COLONOSCOPY: SHX174

## 2018-02-20 MED ORDER — SODIUM CHLORIDE 0.9 % IV SOLN: 500.0000 mL | Freq: Once | INTRAVENOUS | Status: DC

## 2018-02-20 NOTE — Op Note (Signed)
[facility] Patient Name: David Brandt Procedure Date: 02/20/2018 9:42 AM MRN: [id number] Endoscopist: Gatha Mayer , MD Age: 72 Referring MD:  Date of Birth: [birth year] Gender: Male Account #: [id number] Procedure:                Colonoscopy Indications:              Surveillance: Personal history of adenomatous                            polyps on last colonoscopy 3 years ago Medicines:                Propofol per Anesthesia, Monitored Anesthesia Care Procedure:                Pre-Anesthesia Assessment:                           - Prior to the procedure, a History and Physical                            was performed, and patient medications and                            allergies were reviewed. [name] tolerance of                            previous anesthesia was also reviewed. The risks                            and benefits of the procedure and the sedation                            options and risks were discussed with the patient.                            All questions were answered, and informed consent                            was obtained. Prior Anticoagulants: The patient has                            taken no previous anticoagulant or antiplatelet                            agents. ASA Grade Assessment: III - A patient with                            severe systemic disease. After reviewing the risks                            and benefits, the patient was deemed in                            satisfactory condition to undergo the procedure.  After obtaining informed consent, the colonoscope                            was passed under direct vision. Throughout the                            procedure, the patient's blood pressure, pulse, and                            oxygen saturations were monitored continuously. The                            Model CF-HQ190L [id number]) scope was introduced       through the anus and advanced to the the cecum,                            identified by appendiceal orifice and ileocecal                            valve. The colonoscopy was performed with                            difficulty due to significant looping. Successful                            completion of the procedure was aided by                            withdrawing the scope and replacing with the adult                            colonoscope and applying abdominal pressure. The                            patient tolerated the procedure well. The quality                            of the bowel preparation was good. The ileocecal                            valve, appendiceal orifice, and rectum were                            photographed. The bowel preparation used was                            Miralax. Scope In: 9:55:42 AM Scope Out: 10:26:02 AM Scope Withdrawal Time: 0 hours 23 minutes 6 seconds  Total Procedure Duration: 0 hours 30 minutes 20 seconds  Findings:                 The perianal and digital rectal examinations were                            normal. Pertinent  negatives include normal prostate                            (size, shape, and consistency).                           Diverticula were found in the sigmoid colon.                           The exam was otherwise without abnormality on                            direct and retroflexion views. Complications:            No immediate complications. Estimated Blood Loss:     Estimated blood loss: none. Impression:               - Diverticulosis in the sigmoid colon.                           - The examination was otherwise normal on direct                            and retroflexion views.                           - No specimens collected.                           - Personal history of colonic polyps adenomas 2013                            and 2016. Recommendation:           - Patient has a contact number  available for                            emergencies. The signs and symptoms of potential                            delayed complications were discussed with the                            patient. Return to normal activities tomorrow.                            Written discharge instructions were provided to the                            patient.                           - Resume previous diet.                           - Continue present medications.                           -  Repeat colonoscopy in 5 years for surveillance. Gatha Mayer, MD 02/20/2018 10:34:23 AM This report has been signed electronically.

## 2018-02-20 NOTE — Patient Instructions (Addendum)
No polyps today. You still have diverticulosis - thickened muscle rings and pouches in the colon wall. Please read the handout about this condition.  Your next routine colonoscopy should be in 5 years - 2024.  I appreciate the opportunity to care for you. Gatha Mayer, MD, FACG YOU HAD AN ENDOSCOPIC PROCEDURE TODAY AT [facility]:   Refer to the procedure report that was given to you for any specific questions about what was found during the examination.  If the procedure report does not answer your questions, please call your gastroenterologist to clarify.  If you requested that your care partner not be given the details of your procedure findings, then the procedure report has been included in a sealed envelope for you to review at your convenience later.  YOU SHOULD EXPECT: Some feelings of bloating in the abdomen. Passage of more gas than usual.  Walking can help get rid of the air that was put into your GI tract during the procedure and reduce the bloating. If you had a lower endoscopy (such as a colonoscopy or flexible sigmoidoscopy) you may notice spotting of blood in your stool or on the toilet paper. If you underwent a bowel prep for your procedure, you may not have a normal bowel movement for a few days.  Please Note:  You might notice some irritation and congestion in your nose or some drainage.  This is from the oxygen used during your procedure.  There is no need for concern and it should clear up in a day or so.  SYMPTOMS TO REPORT IMMEDIATELY:   Following lower endoscopy (colonoscopy or flexible sigmoidoscopy):  Excessive amounts of blood in the stool  Significant tenderness or worsening of abdominal pains  Swelling of the abdomen that is new, acute  Fever of 100F or higher  For urgent or emergent issues, a gastroenterologist can be reached at any hour by calling [phone].   DIET:  We do recommend a small meal at first, but then you may  proceed to your regular diet.  Drink plenty of fluids but you should avoid alcoholic beverages for 24 hours.  ACTIVITY:  You should plan to take it easy for the rest of today and you should NOT DRIVE or use heavy machinery until tomorrow (because of the sedation medicines used during the test).    FOLLOW UP: Our staff will call the number listed on your records the next business day following your procedure to check on you and address any questions or concerns that you may have regarding the information given to you following your procedure. If we do not reach you, we will leave a message.  However, if you are feeling well and you are not experiencing any problems, there is no need to return our call.  We will assume that you have returned to your regular daily activities without incident.  Follow up colonoscopy screening in 5 years Diverticulosis (handout given)  If any biopsies were taken you will be contacted by phone or by letter within the next 1-3 weeks.  Please call us at [phone] if you have not heard about the biopsies in 3 weeks.    SIGNATURES/CONFIDENTIALITY: You and/or your care partner have signed paperwork which will be entered into your electronic medical record.  These signatures attest to the fact that that the information above on your After Visit Summary has been reviewed and is understood.  Full responsibility of the confidentiality of this discharge information lies  with you and/or your care-partner.

## 2018-02-20 NOTE — Progress Notes (Signed)
To pacu, VSS. Report to [email or web address] 

## 2018-02-20 NOTE — Progress Notes (Signed)
Pt's states no medical or surgical changes since previsit or office visit. 

## 2018-02-25 ENCOUNTER — Telehealth: Payer: Self-pay

## 2018-02-25 ENCOUNTER — Telehealth: Payer: Self-pay | Admitting: *Deleted

## 2018-02-25 NOTE — Telephone Encounter (Signed)
Called # [phone] and left a messaged we tried to reach pt for a follow up call. maw

## 2018-02-25 NOTE — Telephone Encounter (Signed)
  Follow up Call-  Call back number 02/20/2018  Post procedure Call Back phone  # [phone]  Permission to leave phone message Yes  Some recent data might be hidden     Patient questions:  Message left to call us if necessary.  Second call.

## 2018-03-06 ENCOUNTER — Ambulatory Visit: Payer: Medicare PPO | Admitting: Family Medicine

## 2018-03-06 ENCOUNTER — Encounter: Payer: Self-pay | Admitting: Family Medicine

## 2018-03-06 VITALS — BP 130/80 | HR 77 | Temp 98.4°F | Ht 69.0 in | Wt 321.4 lb

## 2018-03-06 DIAGNOSIS — E785 Hyperlipidemia, unspecified: Secondary | ICD-10-CM

## 2018-03-06 DIAGNOSIS — E1169 Type 2 diabetes mellitus with other specified complication: Secondary | ICD-10-CM

## 2018-03-06 DIAGNOSIS — Z23 Encounter for immunization: Secondary | ICD-10-CM | POA: Diagnosis not present

## 2018-03-06 DIAGNOSIS — I10 Essential (primary) hypertension: Secondary | ICD-10-CM

## 2018-03-06 DIAGNOSIS — Z951 Presence of aortocoronary bypass graft: Secondary | ICD-10-CM | POA: Diagnosis not present

## 2018-03-06 DIAGNOSIS — J449 Chronic obstructive pulmonary disease, unspecified: Secondary | ICD-10-CM

## 2018-03-06 LAB — POCT GLYCOSYLATED HEMOGLOBIN (HGB A1C): Hemoglobin A1C: 6 % — AB (ref 4.0–5.6)

## 2018-03-06 MED ORDER — VARENICLINE TARTRATE 0.5 MG X 11 & 1 MG X 42 PO MISC: ORAL | 0 refills | Status: DC

## 2018-03-06 MED ORDER — VARENICLINE TARTRATE 1 MG PO TABS: 1.0000 mg | ORAL_TABLET | Freq: Two times a day (BID) | ORAL | 1 refills | Status: DC

## 2018-03-06 NOTE — Progress Notes (Signed)
   Subjective:    Patient ID: David Brandt, male    DOB: Mar 23, 1946, 72 y.o.   MRN: [id number]  HPI Here to follow up. He recently saw Dr. Oval Linsey and had a normal exam and ECHO. She increased his Atorvastatin to 80 mg daily to get the LDL down. He feels well. He is working with a Physiological scientist 3 days a week to work out. He and his wife both want to try Chantix so they can quit smoking.    Review of Systems  Constitutional: Negative.   Cardiovascular: Negative.   Neurological: Negative.        Objective:   Physical Exam  Constitutional: He is oriented to person, place, and time. He appears well-developed and well-nourished.  Neck: No thyromegaly present.  Cardiovascular: Normal rate, regular rhythm, normal heart sounds and intact distal pulses.  Pulmonary/Chest: Effort normal and breath sounds normal. No stridor. No respiratory distress. He has no wheezes. He has no rales.  Musculoskeletal: He exhibits no edema.  Lymphadenopathy:    He has no cervical adenopathy.  Neurological: He is alert and oriented to person, place, and time.          Assessment & Plan:  His diabetes is well controlled. The A1c today is 6.0. His HTN and CAD are stable. I encouraged him to lose weight. He will try Chantix for 90 days to quit smoking.  Alysia Penna, MD

## 2018-03-11 ENCOUNTER — Ambulatory Visit: Payer: Medicare PPO | Admitting: Cardiovascular Disease

## 2018-03-11 ENCOUNTER — Encounter: Payer: Self-pay | Admitting: Cardiovascular Disease

## 2018-03-11 VITALS — BP 110/80 | HR 76 | Ht 69.0 in | Wt 322.0 lb

## 2018-03-11 DIAGNOSIS — E7800 Pure hypercholesterolemia, unspecified: Secondary | ICD-10-CM

## 2018-03-11 DIAGNOSIS — Z951 Presence of aortocoronary bypass graft: Secondary | ICD-10-CM | POA: Diagnosis not present

## 2018-03-11 DIAGNOSIS — Z5181 Encounter for therapeutic drug level monitoring: Secondary | ICD-10-CM | POA: Diagnosis not present

## 2018-03-11 DIAGNOSIS — I10 Essential (primary) hypertension: Secondary | ICD-10-CM

## 2018-03-11 LAB — LIPID PANEL
CHOL/HDL RATIO: 3.3 ratio (ref 0.0–5.0)
CHOLESTEROL TOTAL: 121 mg/dL (ref 100–199)
HDL: 37 mg/dL — ABNORMAL LOW (ref >39)
LDL Calculated: 52 mg/dL (ref 0–99)
TRIGLYCERIDES: 161 mg/dL — AB (ref 0–149)
VLDL Cholesterol Cal: 32 mg/dL (ref 5–40)

## 2018-03-11 LAB — COMPREHENSIVE METABOLIC PANEL
A/G RATIO: 1.7 (ref 1.2–2.2)
ALT: 23 IU/L (ref 0–44)
AST: 21 IU/L (ref 0–40)
Albumin: 4.3 g/dL (ref 3.5–4.8)
Alkaline Phosphatase: 129 IU/L — ABNORMAL HIGH (ref 39–117)
BILIRUBIN TOTAL: 0.6 mg/dL (ref 0.0–1.2)
BUN/Creatinine Ratio: 10 (ref 10–24)
BUN: 12 mg/dL (ref 8–27)
CHLORIDE: 96 mmol/L (ref 96–106)
CO2: 26 mmol/L (ref 20–29)
Calcium: 10.3 mg/dL — ABNORMAL HIGH (ref 8.6–10.2)
Creatinine, Ser: 1.25 mg/dL (ref 0.76–1.27)
GFR calc non Af Amer: 58 mL/min/{1.73_m2} — ABNORMAL LOW (ref >59)
GFR, EST AFRICAN AMERICAN: 67 mL/min/{1.73_m2} (ref >59)
Globulin, Total: 2.6 g/dL (ref 1.5–4.5)
Glucose: 120 mg/dL — ABNORMAL HIGH (ref 65–99)
POTASSIUM: 4.2 mmol/L (ref 3.5–5.2)
Sodium: 142 mmol/L (ref 134–144)
TOTAL PROTEIN: 6.9 g/dL (ref 6.0–8.5)

## 2018-03-11 NOTE — Patient Instructions (Addendum)
Medication Instructions:  Your physician recommends that you continue on your current medications as directed. Please refer to the Current Medication list given to you today.  Labwork: LP/CMET TODAY   Testing/Procedures: NONE  Follow-Up: Your physician wants you to follow-up in: [place]  You will receive a reminder letter in the mail two months in advance. If you don't receive a letter, please call our office to schedule the follow-up appointment.  Your physician wants you to follow-up in: [place] will receive a reminder letter in the mail two months in advance. If you don't receive a letter, please call our office to schedule the follow-up appointment.  You have been referred to [place] refers to food and lifestyle choices that are based on the traditions of countries located on the [employer]. This way of eating has been shown to help prevent certain conditions and improve outcomes for people who have chronic diseases, like kidney disease and heart disease. What are tips for following this plan? Lifestyle  Cook and eat meals together with your family, when possible.  Drink enough fluid to keep your urine clear or pale yellow.  Be physically active every day. This includes: ? Aerobic exercise like running or swimming. ? Leisure activities like gardening, walking, or housework.  Get 7-8 hours of sleep each night.  If recommended by your health care provider, drink red wine in moderation. This means 1 glass a day for nonpregnant women and 2 glasses a day for men. A glass of wine equals 5 oz (150 mL). Reading food labels  Check the serving size of packaged foods. For foods such as rice and pasta, the serving size refers to the amount of cooked product, not dry.  Check the total fat in packaged foods. Avoid foods that have saturated fat or trans fats.  Check the  ingredients list for added sugars, such as corn syrup. Shopping  At the grocery store, buy most of your food from the areas near the walls of the store. This includes: ? Fresh fruits and vegetables (produce). ? Grains, beans, nuts, and seeds. Some of these may be available in unpackaged forms or large amounts (in bulk). ? Fresh seafood. ? Poultry and eggs. ? Low-fat dairy products.  Buy whole ingredients instead of prepackaged foods.  Buy fresh fruits and vegetables in-season from local farmers markets.  Buy frozen fruits and vegetables in resealable bags.  If you do not have access to quality fresh seafood, buy precooked frozen shrimp or canned fish, such as tuna, salmon, or sardines.  Buy small amounts of raw or cooked vegetables, salads, or olives from the deli or salad bar at your store.  Stock your pantry so you always have certain foods on hand, such as olive oil, canned tuna, canned tomatoes, rice, pasta, and beans. Cooking  Cook foods with extra-virgin olive oil instead of using butter or other vegetable oils.  Have meat as a side dish, and have vegetables or grains as your main dish. This means having meat in small portions or adding small amounts of meat to foods like pasta or stew.  Use beans or vegetables instead of meat in common dishes like chili or lasagna.  Experiment with different cooking methods. Try roasting or broiling vegetables instead of steaming or sauteing them.  Add frozen vegetables to soups, stews, pasta, or rice.  Add nuts or seeds  for added healthy fat at each meal. You can add these to yogurt, salads, or vegetable dishes.  Marinate fish or vegetables using olive oil, lemon juice, garlic, and fresh herbs. Meal planning  Plan to eat 1 vegetarian meal one day each week. Try to work up to 2 vegetarian meals, if possible.  Eat seafood 2 or more times a week.  Have healthy snacks readily available, such as: ? Vegetable sticks with hummus. ? [place]  yogurt. ? Fruit and nut trail mix.  Eat balanced meals throughout the week. This includes: ? Fruit: 2-3 servings a day ? Vegetables: 4-5 servings a day ? Low-fat dairy: 2 servings a day ? Fish, poultry, or lean meat: 1 serving a day ? Beans and legumes: 2 or more servings a week ? Nuts and seeds: 1-2 servings a day ? Whole grains: 6-8 servings a day ? Extra-virgin olive oil: 3-4 servings a day  Limit red meat and sweets to only a few servings a month What are my food choices?  Mediterranean diet ? Recommended ? Grains: Whole-grain pasta. Brown rice. Bulgar wheat. Polenta. Couscous. Whole-wheat bread. [place]. ? Vegetables: Artichokes. Beets. Broccoli. Cabbage. Carrots. Eggplant. Green beans. Chard. Kale. Spinach. Onions. Leeks. Peas. Squash. Tomatoes. Peppers. Radishes. ? Fruits: Apples. Apricots. Avocado. Berries. Bananas. Cherries. Dates. Figs. Grapes. Lemons. Melon. Oranges. Peaches. Plums. Pomegranate. ? Meats and other protein foods: Beans. Almonds. Sunflower seeds. Pine nuts. Peanuts. [place]. Salmon. Scallops. Shrimp. [place]. Tilapia. Clams. Oysters. Eggs. ? Dairy: Low-fat milk. Cheese. Greek yogurt. ? Beverages: Water. Red wine. Herbal tea. ? Fats and oils: Extra virgin olive oil. Avocado oil. Grape seed oil. ? Sweets and desserts: [place] yogurt with honey. Baked apples. Poached pears. Trail mix. ? Seasoning and other foods: Basil. Cilantro. Coriander. Cumin. Mint. Parsley. Sage. Rosemary. Tarragon. Garlic. Oregano. Thyme. Pepper. Balsalmic vinegar. Tahini. Hummus. Tomato sauce. Olives. Mushrooms. ? Limit these ? Grains: Prepackaged pasta or rice dishes. Prepackaged cereal with added sugar. ? Vegetables: Deep fried potatoes (french fries). ? Fruits: Fruit canned in syrup. ? Meats and other protein foods: Beef. Pork. Lamb. Poultry with skin. Hot dogs. [place] Salines. ? Dairy: Ice cream. Sour cream. Whole milk. ? Beverages: Juice. Sugar-sweetened soft drinks. Beer. Liquor and  spirits. ? Fats and oils: Butter. Canola oil. Vegetable oil. Beef fat (tallow). Lard. ? Sweets and desserts: Cookies. Cakes. Pies. Candy. ? Seasoning and other foods: Mayonnaise. Premade sauces and marinades. ? The items listed may not be a complete list. Talk with your dietitian about what dietary choices are right for you. Summary  The Mediterranean diet includes both food and lifestyle choices.  Eat a variety of fresh fruits and vegetables, beans, nuts, seeds, and whole grains.  Limit the amount of red meat and sweets that you eat.  Talk with your health care provider about whether it is safe for you to drink red wine in moderation. This means 1 glass a day for nonpregnant women and 2 glasses a day for men. A glass of wine equals 5 oz (150 mL). This information is not intended to replace advice given to you by your health care provider. Make sure you discuss any questions you have with your health care provider. Document Released: 02/02/2016 Document Revised: 03/06/2016 Document Reviewed: 02/02/2016 Elsevier Interactive Patient Education  [employer].

## 2018-03-11 NOTE — Progress Notes (Signed)
Cardiology Office Note   Date:  03/11/2018   ID:  David Brandt, DOB February 23, 1946, MRN [id number]  PCP:  [name], MD  Cardiologist:   Skeet Latch, MD   No chief complaint on file.    History of Present Illness: David Brandt is a 72 y.o. male with CAD s/p CABG (LIMA-->LAD, SVG-->D1,D2, SVG-->OM, SVG-->RCA) , diabetes, OSA on CPAP, post-operative atrial fibrillation, hypertension, and hyperlipidemia who presents for follow.   David Brandt initially presented with chest pressure and shortness of breath 03/2016.  He underwent Lexiscan Myoview on 03/28/16 that revealed LVEF 48% with a large inferior and inferior lateral MI and no evidence of ischemia. There was also basal to mid inferior wall akinesis.  He subsequently underwent left heart catheterization where he was found to have severe three-vessel CAD.  He underwent CABG with Dr. Servando Snare on 04/02/16.  He developed post-operative atrial fibrillation and was started on amiodarone.  There were were no other postoperative complications and he was discharged 04/11/16.  Echo 03/30/16 revealed LVEF 55-60% and was otherwise unremarkable.   David Brandt followed up with Dr. Servando Snare and reduced amiodarone to 200mg  04/2016.  This was discontinued 07/2016.  At his last appointment David Brandt was encouraged to exercise more.  He also noted lower extremity edema that was thought to be due to felodipine.  BNP was not elevated.  Atorvastatin was increased because his LDL was 85.  He has been feeling well.  He continues to workout at the gym 3 days/week.  However he is frustrated that he is not losing more weight.  He has been working on cutting out carbohydrates with his meals but he still struggles with portion control.  He also notes that he sometimes sneaks at the grocery store and buys snack cakes or honey bones.  He has not had any chest pain and his breathing has been okay.  He denies lower extremity edema, orthopnea, and PND.  His  blood pressure has been well-controlled.   Past Medical History:  Diagnosis Date  . Abnormal cardiovascular stress test 03/29/2016  . Allergic rhinitis   . Allergy   . Arthritis   . Asthma   . Diabetes ([place]) 03/29/2016  . Gout   . Hyperlipidemia   . Hypertension   . Sleep apnea    cpap    Past Surgical History:  Procedure Laterality Date  . CARDIAC CATHETERIZATION N/A 03/29/2016   Procedure: Right/Left Heart Cath and Coronary Angiography;  Surgeon: Nelva Bush, MD;  Location: [facility];  Service: Cardiovascular;  Laterality: N/A;  . CARDIAC CATHETERIZATION N/A 03/29/2016   Procedure: Intravascular Pressure Wire/FFR Study;  Surgeon: Nelva Bush, MD;  Location: [facility];  Service: Cardiovascular;  Laterality: N/A;  . CARDIAC CATHETERIZATION Left 04/02/2016   Procedure: FEMORAL ARTERIAL LINE INSERTION;  Surgeon: [name], MD;  Location: [facility];  Service: Open Heart Surgery;  Laterality: Left;  . COLONOSCOPY  02/20/2018   per Dr. Carlean Purl, no polyps, repeat in 5 yrs (previous adenomatous polyps)   . CORONARY ARTERY BYPASS GRAFT N/A 04/02/2016   Procedure: CORONARY ARTERY BYPASS GRAFTING (CABG) X 5 UTILIZING LEFT INTERNAL MAMMARY ARTERY AND ENDOSCOPICALLY HARVESTED GREATER SAPHENOUS VEIN ; Mammary to LAD, Sequencial 1st to 2nd Diagonal, Vein to OM, Vein to Distal RCA.;  Surgeon: [name], MD;  Location: [facility];  Service: Open Heart Surgery;  Laterality: N/A;  . LYMPH NODE BIOPSY Left 04/02/2016   Procedure: INCIDENTAL LEFT MAMMARY LYMPH NODE  BIOPSY;  Surgeon: [name], MD;  Location: [facility];  Service: Open Heart Surgery;  Laterality: Left;  . mass abdomen  1955  . TEE WITHOUT CARDIOVERSION N/A 04/02/2016   Procedure: TRANSESOPHAGEAL ECHOCARDIOGRAM (TEE);  Surgeon: [name], MD;  Location: [facility];  Service: Open Heart Surgery;  Laterality: N/A;     Current Outpatient Medications  Medication Sig Dispense Refill  . aspirin EC 81 MG  tablet Take 81 mg by mouth daily.    Marland Kitchen atorvastatin (LIPITOR) 80 MG tablet Take 0.5 tablets (40 mg total) by mouth every evening. 30 tablet 5  . Cholecalciferol (VITAMIN D3) 2000 units TABS Take 2,000 Units by mouth daily.    . felodipine (PLENDIL) 10 MG 24 hr tablet Take 1 tablet (10 mg total) by mouth daily. Keep appointment, 90 tablet 0  . hydrochlorothiazide (MICROZIDE) 12.5 MG capsule Take 1 capsule (12.5 mg total) by mouth daily. 90 capsule 3  . metFORMIN (GLUCOPHAGE-XR) 500 MG 24 hr tablet Take 1 tablet (500 mg total) by mouth daily. 90 tablet 3  . metoprolol succinate (TOPROL-XL) 50 MG 24 hr tablet Take 1 tablet (50 mg total) by mouth daily. Take with or immediately following a meal. 90 tablet 3  . OVER THE COUNTER MEDICATION Take 2 capsules by mouth 2 (two) times daily. OmegaXL    . SYMBICORT 160-4.5 MCG/ACT inhaler Inhale 2 puffs into the lungs 2 (two) times daily. 3 Inhaler 3  . testosterone cypionate (DEPOTESTOSTERONE CYPIONATE) 200 MG/ML injection Inject 1 mL (200 mg total) into the muscle every 14 (fourteen) days. 6 mL 1  . varenicline (CHANTIX CONTINUING MONTH PAK) 1 MG tablet Take 1 tablet (1 mg total) by mouth 2 (two) times daily. 56 tablet 1  . varenicline (CHANTIX STARTING MONTH PAK) 0.5 MG X 11 & 1 MG X 42 tablet Take one 0.5 mg tablet by mouth once daily for 3 days, then increase to one 0.5 mg tablet twice daily for 4 days, then increase to one 1 mg tablet twice daily. 53 tablet 0   Current Facility-Administered Medications  Medication Dose Route Frequency Provider Last Rate Last Dose  . 0.9 %  sodium chloride infusion  500 mL Intravenous Once Gatha Mayer, MD        Allergies:   Allopurinol; Valsartan-hydrochlorothiazide; and Sulfa antibiotics    Social History:  The patient  reports that he has been smoking cigarettes. He has a 47.00 pack-year smoking history. He has never used smokeless tobacco. He reports that he drinks about 4.0 standard drinks of alcohol per week.  He reports that he has current or past drug history. Drug: Marijuana. Frequency: 7.00 times per week.   Family History:  [name] family history includes Colon cancer (age of onset: 57) in his father; Diabetes in his maternal uncle and mother; Heart disease in his maternal aunt; Stroke in his mother.    ROS:  Please see the history of present illness.   Otherwise, review of systems are positive for none.   All other systems are reviewed and negative.    PHYSICAL EXAM: VS:  BP 110/80   Pulse 76   Ht 5\' 9"  (1.753 m)   Wt (!) 322 lb (146.1 kg)   BMI 47.55 kg/m  , BMI Body mass index is 47.55 kg/m. GENERAL:  Well appearing HEENT: Pupils equal round and reactive, fundi not visualized, oral mucosa unremarkable NECK:  No jugular venous distention, waveform within normal limits, carotid upstroke brisk and symmetric, no  bruits LUNGS:  Clear to auscultation bilaterally HEART:  RRR.  PMI not displaced or sustained,S1 and S2 within normal limits, no S3, no S4, no clicks, no rubs, no murmurs ABD:  Flat, positive bowel sounds normal in frequency in pitch, no bruits, no rebound, no guarding, no midline pulsatile mass, no hepatomegaly, no splenomegaly EXT:  2 plus pulses throughout, no edema, no cyanosis no clubbing SKIN:  No rashes no nodules NEURO:  Cranial nerves II through XII grossly intact, motor grossly intact throughout PSYCH:  Cognitively intact, oriented to person place and time  EKG:  EKG is not ordered today. The ekg ordered 05/02/16 demonstrates sinus rhythm. Rate 65 bpm.  12/09/17: Sinus rhythm.  Rate 68 bpm.  Prior septal infarct.  Inferolateral TWI  Lexiscan Myoview 03/29/16: Nuclear stress EF: 48%.  The left ventricular ejection fraction is mildly decreased (45-54%).   Large inferior and inferior lateral wall MI from apex to base with no ischemia EF 48% with mid and basal inferior wall akinesis   LHC 03/29/16: Conclusions: 1. Severe 3 vessel coronary artery disease, as  detailed below, including 50% distal LMCA/ostial LAD stenoses (FFR 0.70), 80% D1 lesion, 80% ostial LCx stenosis, and diffusely diseased RCA with 99% mid RCA stenosis with TIMI-1 flow and competitive filling of the PDA and rPL branches by left-to-right collaterals. 2. Mildly decreased LV systolic function with inferior akinesis (LVEF 45-50%) 3. Upper normal to mildly elevated left heart filling pressures. 4. Reduced Fick cardiac output.   Echo  03/30/16:  Study Conclusions  - Left ventricle: The cavity size was normal. Wall thickness was   increased in a pattern of moderate LVH. Systolic function was   normal. The estimated ejection fraction was in the range of 55%   to 60%. Left ventricular diastolic function parameters were   normal. - Left atrium: The atrium was mildly dilated. - Atrial septum: No defect or patent foramen ovale was identified. - Pulmonary arteries: PA peak pressure: 34 mm Hg (S).  Recent Labs: 12/10/2017: ALT 22; BUN 15; Creatinine, Ser 0.97; Hemoglobin 16.1; NT-Pro BNP 227; Platelets 228; Potassium 4.4; Sodium 140    Lipid Panel    Component Value Date/Time   CHOL 177 12/10/2017 1020   TRIG 248 (H) 12/10/2017 1020   HDL 42 12/10/2017 1020   CHOLHDL 4.2 12/10/2017 1020   CHOLHDL 4.1 03/30/2016 0156   VLDL 59 (H) 03/30/2016 0156   LDLCALC 85 12/10/2017 1020      Wt Readings from Last 3 Encounters:  03/11/18 (!) 322 lb (146.1 kg)  03/06/18 (!) 321 lb 6.4 oz (145.8 kg)  02/20/18 (!) 326 lb (147.9 kg)      ASSESSMENT AND PLAN:  # CAD s/p CABG:  # Hyperlipidemia:   Mr. Choquette is doing well and has no angina.  Continue aspirin, atorvastatin, and metoporlol.  He was congratulated on his excellent exercise routine.  I did recommend that he try to increase his cardio some.  Continue aspirin, atorvastatin, and metoprolol.  We will check fasting lipids today as his LDL was not at goal in June.  # Hypertension:  Blood pressure is well-controlled.  We  discussed the fact that this is likely because of his exercise routine.  His blood pressure is improving even though his weight is not yet dropping.  Continue management with felodipine, hydrochlorothiazide, and metoprolol.  # Post-op atrial fibrillation: No recurrence since stopping amiodarone.  Continue metoprolol.  # OSA: Encouraged continued use of CPAP.  # Shortness of breath:  Improved with exercise.  He no longer has edema.  # Morbid obesity:  We discussed following a Mediterranean diet.  We also talked about reducing his snacks and portions.  We will refer him to our care guide to discuss nutrition further.   Current medicines are reviewed at length with the patient today.  The patient does not have concerns regarding medicines.  The following changes have been made:  no change  Labs/ tests ordered today include:   Orders Placed This Encounter  Procedures  . Lipid panel  . Comprehensive metabolic panel     Disposition:   FU with Yumiko Alkins C. Oval Linsey, MD, [facility] in 1 year.  [place] or [place] in 6 months.     Signed, Kaveon Blatz C. Oval Linsey, MD, [facility]  03/11/2018 9:41 AM    [place]

## 2018-03-29 ENCOUNTER — Other Ambulatory Visit: Payer: Self-pay | Admitting: Family Medicine

## 2018-05-01 ENCOUNTER — Telehealth: Payer: Self-pay | Admitting: *Deleted

## 2018-05-01 NOTE — Telephone Encounter (Signed)
Copied from [place] [phone]. Topic: General - Other >> May 01, 2018  9:43 AM Valla Leaver wrote: Reason for CRM: Fritz Pickerel, NP with patient's insurance, will be seeing the patient periodically to check vitals at home and make sure he stays out of the hospital and notes from these visits will be sent to Dr. Sharlene Motts. She just wanted to make Dr. Sharlene Motts aware.

## 2018-05-01 NOTE — Telephone Encounter (Signed)
FYI

## 2018-05-25 ENCOUNTER — Other Ambulatory Visit: Payer: Self-pay | Admitting: Cardiovascular Disease

## 2018-05-26 NOTE — Telephone Encounter (Signed)
Rx(s) sent to pharmacy electronically.  

## 2018-06-21 ENCOUNTER — Other Ambulatory Visit: Payer: Self-pay | Admitting: Family Medicine

## 2018-06-24 NOTE — Telephone Encounter (Signed)
rx has been faxed to the pharmacy.  

## 2018-06-24 NOTE — Telephone Encounter (Signed)
Dr. [name] please advise on refill of testosterone.  thanks

## 2018-06-24 NOTE — Telephone Encounter (Signed)
Please refill this for 6 months  

## 2018-09-08 ENCOUNTER — Telehealth: Payer: Self-pay

## 2018-09-08 NOTE — Telephone Encounter (Signed)
Copied from [place] [phone]. Topic: General - Inquiry >> Sep 08, 2018 10:42 AM Alanda Slim E wrote: Reason for CRM: Pt urine has been orange in color and he thinks it may be possible blood in urine/ Can Pt bring by a urine sample or does he need to schedule an appt/ please advise

## 2018-09-08 NOTE — Telephone Encounter (Signed)
Called and spoke with pt and appt has been scheduled for the morning  And pt is aware.

## 2018-09-09 ENCOUNTER — Other Ambulatory Visit: Payer: Self-pay

## 2018-09-09 ENCOUNTER — Ambulatory Visit: Payer: Medicare PPO | Admitting: Family Medicine

## 2018-09-09 ENCOUNTER — Encounter: Payer: Self-pay | Admitting: Family Medicine

## 2018-09-09 VITALS — BP 128/84 | HR 73 | Temp 97.7°F | Wt 318.0 lb

## 2018-09-09 DIAGNOSIS — N390 Urinary tract infection, site not specified: Secondary | ICD-10-CM

## 2018-09-09 DIAGNOSIS — R319 Hematuria, unspecified: Secondary | ICD-10-CM

## 2018-09-09 LAB — POCT URINALYSIS DIPSTICK
BILIRUBIN UA: NEGATIVE
Glucose, UA: NEGATIVE
KETONES UA: NEGATIVE
Nitrite, UA: NEGATIVE
Protein, UA: NEGATIVE
Spec Grav, UA: 1.015 (ref 1.010–1.025)
Urobilinogen, UA: 0.2 E.U./dL
pH, UA: 7 (ref 5.0–8.0)

## 2018-09-09 MED ORDER — CIPROFLOXACIN HCL 500 MG PO TABS: 500.0000 mg | ORAL_TABLET | Freq: Two times a day (BID) | ORAL | 0 refills | Status: DC

## 2018-09-09 NOTE — Progress Notes (Signed)
   Subjective:    Patient ID: David Brandt, male    DOB: 1946-01-28, 73 y.o.   MRN: [id number]  HPI Here for intermittent blood in the urine for several weeks. No urgency or burning or fever. He drinks plenty of water.    Review of Systems  Constitutional: Negative.   Respiratory: Negative.   Cardiovascular: Negative.   Genitourinary: Positive for hematuria. Negative for discharge, dysuria, flank pain and urgency.       Objective:   Physical Exam Constitutional:      Appearance: Normal appearance.  Cardiovascular:     Rate and Rhythm: Normal rate and regular rhythm.     Pulses: Normal pulses.     Heart sounds: Normal heart sounds.  Pulmonary:     Effort: Pulmonary effort is normal.     Breath sounds: Normal breath sounds.  Abdominal:     General: Abdomen is flat. Bowel sounds are normal. There is no distension.     Palpations: Abdomen is soft. There is no mass.     Tenderness: There is no abdominal tenderness. There is no guarding or rebound.     Hernia: No hernia is present.  Neurological:     Mental Status: He is alert.           Assessment & Plan:  UTI, treat with Cipro. Culture the sample. Alysia Penna, MD

## 2018-09-09 NOTE — Addendum Note (Signed)
Addended by: Elmer Picker on: 09/09/2018 11:48 AM   Modules accepted: Orders

## 2018-09-10 LAB — URINE CULTURE
MICRO NUMBER:: 328481
RESULT: NO GROWTH
SPECIMEN QUALITY: ADEQUATE

## 2018-09-11 ENCOUNTER — Encounter: Payer: Self-pay | Admitting: *Deleted

## 2018-09-13 DIAGNOSIS — H40003 Preglaucoma, unspecified, bilateral: Secondary | ICD-10-CM | POA: Insufficient documentation

## 2018-11-07 DIAGNOSIS — H40003 Preglaucoma, unspecified, bilateral: Secondary | ICD-10-CM | POA: Diagnosis not present

## 2018-11-10 DIAGNOSIS — H40003 Preglaucoma, unspecified, bilateral: Secondary | ICD-10-CM | POA: Diagnosis not present

## 2018-11-13 ENCOUNTER — Telehealth: Payer: Self-pay | Admitting: Cardiology

## 2018-11-13 NOTE — Telephone Encounter (Signed)
iphone/ son got patient enrolled in mychart/ consent/ pre reg completed

## 2018-11-18 ENCOUNTER — Encounter: Payer: Self-pay | Admitting: Cardiology

## 2018-11-18 ENCOUNTER — Telehealth (INDEPENDENT_AMBULATORY_CARE_PROVIDER_SITE_OTHER): Payer: Medicare PPO | Admitting: Cardiology

## 2018-11-18 ENCOUNTER — Telehealth: Payer: Self-pay

## 2018-11-18 VITALS — BP 152/100 | Ht 69.0 in | Wt 317.0 lb

## 2018-11-18 DIAGNOSIS — Z951 Presence of aortocoronary bypass graft: Secondary | ICD-10-CM

## 2018-11-18 DIAGNOSIS — E785 Hyperlipidemia, unspecified: Secondary | ICD-10-CM

## 2018-11-18 DIAGNOSIS — G4733 Obstructive sleep apnea (adult) (pediatric): Secondary | ICD-10-CM

## 2018-11-18 DIAGNOSIS — E1169 Type 2 diabetes mellitus with other specified complication: Secondary | ICD-10-CM

## 2018-11-18 DIAGNOSIS — I10 Essential (primary) hypertension: Secondary | ICD-10-CM

## 2018-11-18 NOTE — Progress Notes (Signed)
Virtual Visit via Telephone Note   This visit type was conducted due to national recommendations for restrictions regarding the COVID-19 Pandemic (e.g. social distancing) in an effort to limit this patient's exposure and mitigate transmission in our community.  Due to his co-morbid illnesses, this patient is at least at moderate risk for complications without adequate follow up.  This format is felt to be most appropriate for this patient at this time.  The patient did not have access to video technology/had technical difficulties with video requiring transitioning to audio format only (telephone).  All issues noted in this document were discussed and addressed.  No physical exam could be performed with this format.  Please refer to the patient's chart for his  consent to telehealth for [facility].   Date:  11/18/2018   ID:  David Brandt, DOB 01/30/1946, MRN [id number]  Patient Location: Home Provider Location: Office  PCP:  [name], MD  Cardiologist:  Skeet Latch, MD  Electrophysiologist:  None   Evaluation Performed:  Follow-Up Visit  Chief Complaint:  none  History of Present Illness:    David Brandt is a 73 y.o. male with a history of CABG x 5 in Oct 2017, obesity, OSA on C-pap, and DM. He was contacted today for routine follow up. His son was on the line as well.  The patient says he has not been doing "anything" since the pandemic stay at home order.  He had been going to the [facility] but now says he hasn't done anything physical-"I don't even walk to the mailbox, I drive".  He denies and unusual SOB or chest pain.  His B/P was elevated today but he says that's unusual for him and he attributes it to "eating pork" yesterday.   The patient does not have symptoms concerning for COVID-19 infection (fever, chills, cough, or new shortness of breath).    Past Medical History:  Diagnosis Date  . Abnormal cardiovascular stress test 03/29/2016  . Allergic rhinitis    . Allergy   . Arthritis   . Asthma   . Diabetes ([place]) 03/29/2016  . Gout   . Hyperlipidemia   . Hypertension   . Sleep apnea    cpap   Past Surgical History:  Procedure Laterality Date  . CARDIAC CATHETERIZATION N/A 03/29/2016   Procedure: Right/Left Heart Cath and Coronary Angiography;  Surgeon: Nelva Bush, MD;  Location: [place] CV LAB;  Service: Cardiovascular;  Laterality: N/A;  . CARDIAC CATHETERIZATION N/A 03/29/2016   Procedure: Intravascular Pressure Wire/FFR Study;  Surgeon: Nelva Bush, MD;  Location: [place] CV LAB;  Service: Cardiovascular;  Laterality: N/A;  . CARDIAC CATHETERIZATION Left 04/02/2016   Procedure: FEMORAL ARTERIAL LINE INSERTION;  Surgeon: [name], MD;  Location: [place];  Service: Open Heart Surgery;  Laterality: Left;  . COLONOSCOPY  02/20/2018   per Dr. Carlean Purl, no polyps, repeat in 5 yrs (previous adenomatous polyps)   . CORONARY ARTERY BYPASS GRAFT N/A 04/02/2016   Procedure: CORONARY ARTERY BYPASS GRAFTING (CABG) X 5 UTILIZING LEFT INTERNAL MAMMARY ARTERY AND ENDOSCOPICALLY HARVESTED GREATER SAPHENOUS VEIN ; Mammary to LAD, Sequencial 1st to 2nd Diagonal, Vein to OM, Vein to Distal RCA.;  Surgeon: [name], MD;  Location: [place];  Service: Open Heart Surgery;  Laterality: N/A;  . LYMPH NODE BIOPSY Left 04/02/2016   Procedure: INCIDENTAL LEFT MAMMARY LYMPH NODE BIOPSY;  Surgeon: [name], MD;  Location: [place];  Service: Open Heart Surgery;  Laterality: Left;  .  mass abdomen  1955  . TEE WITHOUT CARDIOVERSION N/A 04/02/2016   Procedure: TRANSESOPHAGEAL ECHOCARDIOGRAM (TEE);  Surgeon: [name], MD;  Location: [place];  Service: Open Heart Surgery;  Laterality: N/A;     Current Meds  Medication Sig  . aspirin EC 81 MG tablet Take 81 mg by mouth daily.  Marland Kitchen atorvastatin (LIPITOR) 80 MG tablet Take 0.5 tablets (40 mg total) by mouth every evening.  Marland Kitchen CHANTIX CONTINUING MONTH PAK 1 MG tablet TAKE 1 TABLET BY MOUTH  TWICE A DAY  . Cholecalciferol (VITAMIN D3) 2000 units TABS Take 2,000 Units by mouth daily.  . ciprofloxacin (CIPRO) 500 MG tablet Take 1 tablet (500 mg total) by mouth 2 (two) times daily.  . felodipine (PLENDIL) 10 MG 24 hr tablet Take 1 tablet (10 mg total) by mouth daily.  . hydrochlorothiazide (MICROZIDE) 12.5 MG capsule Take 1 capsule (12.5 mg total) by mouth daily.  . metFORMIN (GLUCOPHAGE-XR) 500 MG 24 hr tablet Take 1 tablet (500 mg total) by mouth daily.  . metoprolol succinate (TOPROL-XL) 50 MG 24 hr tablet Take 1 tablet (50 mg total) by mouth daily. Take with or immediately following a meal.  . OVER THE COUNTER MEDICATION Take 2 capsules by mouth 2 (two) times daily. OmegaXL  . SYMBICORT 160-4.5 MCG/ACT inhaler Inhale 2 puffs into the lungs 2 (two) times daily.  Marland Kitchen testosterone cypionate (DEPOTESTOSTERONE CYPIONATE) 200 MG/ML injection INJECT 1 ML INTRAMUSCULARLY EVERY 14 DAYS  . varenicline (CHANTIX STARTING MONTH PAK) 0.5 MG X 11 & 1 MG X 42 tablet Take one 0.5 mg tablet by mouth once daily for 3 days, then increase to one 0.5 mg tablet twice daily for 4 days, then increase to one 1 mg tablet twice daily.   Current Facility-Administered Medications for the 11/18/18 encounter (Telemedicine) with Erlene Quan, PA-C  Medication  . 0.9 %  sodium chloride infusion     Allergies:   Allopurinol; Valsartan-hydrochlorothiazide; and Sulfa antibiotics   Social History   Tobacco Use  . Smoking status: Current Every Day Smoker    Packs/day: 1.00    Years: 47.00    Pack years: 47.00    Types: Cigarettes    Last attempt to quit: 01/24/2016    Years since quitting: 2.8  . Smokeless tobacco: Never Used  Substance Use Topics  . Alcohol use: Yes    Alcohol/week: 4.0 standard drinks    Types: 4 Cans of beer per week  . Drug use: Yes    Frequency: 7.0 times per week    Types: Marijuana    Comment: marijuana     Family Hx: [name] family history includes Colon cancer (age of  onset: 87) in his father; Diabetes in his maternal uncle and mother; Heart disease in his maternal aunt; Stroke in his mother. There is no history of Stomach cancer, Esophageal cancer, or Rectal cancer.  ROS:   Please see the history of present illness.    All other systems reviewed and are negative.   Prior CV studies:   The following studies were reviewed today:   Labs/Other Tests and Data Reviewed:    EKG:  No ECG reviewed.  Recent Labs: 12/10/2017: Hemoglobin 16.1; NT-Pro BNP 227; Platelets 228 03/11/2018: ALT 23; BUN 12; Creatinine, Ser 1.25; Potassium 4.2; Sodium 142   Recent Lipid Panel Lab Results  Component Value Date/Time   CHOL 121 03/11/2018 09:10 AM   TRIG 161 (H) 03/11/2018 09:10 AM   HDL 37 (L) 03/11/2018 09:10  AM   CHOLHDL 3.3 03/11/2018 09:10 AM   CHOLHDL 4.1 03/30/2016 01:56 AM   LDLCALC 52 03/11/2018 09:10 AM    Wt Readings from Last 3 Encounters:  11/18/18 (!) 317 lb (143.8 kg)  09/09/18 (!) 318 lb (144.2 kg)  03/11/18 (!) 322 lb (146.1 kg)     Objective:    Vital Signs:  BP (!) 152/100   Ht 5\' 9"  (1.753 m)   Wt (!) 317 lb (143.8 kg)   BMI 46.81 kg/m    VITAL SIGNS:  reviewed  ASSESSMENT & PLAN:    CABG x 5- Oct 2017. He had PAF post CABG and was on Amiodarone for a short time.  Echo June 2019 showed preserved LVF.  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  The importance of social distancing was discussed today.  Time:   Today, I have spent 15 minutes with the patient with telehealth technology discussing the above problems.     Medication Adjustments/Labs and Tests Ordered: Current medicines are reviewed at length with the patient today.  Concerns regarding medicines are outlined above.   Tests Ordered: No orders of the defined types were placed in this  encounter.   Medication Changes: No orders of the defined types were placed in this encounter.   Disposition:  Follow up We discussed the importance of getting some exercise- walking outside- and watching his diet.  I suggested he monitor his B/P over the next two weeks and let us know what the trend is, he may need further adjustements in his medications  Signed, Kerin Ransom, PA-C  11/18/2018 11:19 AM    [place]

## 2018-11-18 NOTE — Telephone Encounter (Signed)
Called patient to discuss AVS instructions. Patient agreeable with Luke's recommendations and voiced understanding.

## 2018-11-18 NOTE — Patient Instructions (Addendum)
Medication Instructions:  Your physician recommends that you continue on your current medications as directed. Please refer to the Current Medication list given to you today. If you need a refill on your cardiac medications before your next appointment, please call your pharmacy.   Lab work: None  If you have labs (blood work) drawn today and your tests are completely normal, you will receive your results only by: Marland Kitchen MyChart Message (if you have MyChart) OR . A paper copy in the mail If you have any lab test that is abnormal or we need to change your treatment, we will call you to review the results.  Testing/Procedures: None   Follow-Up: At [facility], you and your health needs are our priority.  As part of our continuing mission to provide you with exceptional heart care, we have created designated Provider Care Teams.  These Care Teams include your primary Cardiologist (physician) and Advanced Practice Providers (APPs -  Physician Assistants and Nurse Practitioners) who all work together to provide you with the care you need, when you need it. You will need a follow up appointment in 6 months.  Please call our office 2 months in advance to schedule this appointment.  You may see Skeet Latch, MD or one of the following Advanced Practice Providers on your designated Care Team:   Kerin Ransom, PA-C Roby Lofts, [place] . Sande Rives, PA-C  Any Other Special Instructions Will Be Listed Below (If Applicable).  Check your blood pressure 3 times a week on Monday, Wednesday and Friday  Take walks

## 2018-11-20 ENCOUNTER — Other Ambulatory Visit: Payer: Self-pay | Admitting: Family Medicine

## 2018-11-28 ENCOUNTER — Other Ambulatory Visit: Payer: Self-pay | Admitting: Family Medicine

## 2018-12-04 ENCOUNTER — Other Ambulatory Visit: Payer: Self-pay | Admitting: Cardiovascular Disease

## 2018-12-07 ENCOUNTER — Other Ambulatory Visit: Payer: Self-pay | Admitting: Family Medicine

## 2018-12-11 ENCOUNTER — Other Ambulatory Visit: Payer: Self-pay | Admitting: Family Medicine

## 2018-12-24 ENCOUNTER — Other Ambulatory Visit: Payer: Self-pay | Admitting: Family Medicine

## 2019-01-08 DIAGNOSIS — G4733 Obstructive sleep apnea (adult) (pediatric): Secondary | ICD-10-CM | POA: Diagnosis not present

## 2019-02-10 ENCOUNTER — Telehealth: Payer: Self-pay | Admitting: Cardiology

## 2019-02-10 NOTE — Telephone Encounter (Signed)
New Message    Joe from [facility] calling for pressure setting in numerical value for patient CPAP machine script.  Please call him back to advise.

## 2019-02-10 NOTE — Telephone Encounter (Signed)
Returned the call to [place]. Spoke with Salomon Fick adv her that Dr.[place] or Jana Half does not manage CPAP or the patient's sleep dx. Recommended that they try and contact the patient' primary care physician. They may be able to provide the ordering physician. Salomon Fick sts that she will update the patients records and contact the patient's pcp. Darcy voiced appreciation for the call back.

## 2019-02-12 ENCOUNTER — Other Ambulatory Visit: Payer: Self-pay | Admitting: Cardiovascular Disease

## 2019-02-13 ENCOUNTER — Telehealth: Payer: Self-pay

## 2019-02-13 NOTE — Telephone Encounter (Signed)
Called back and spoke with David Brandt. She sated that the patient needed a prescription for a Cpap machine. I informed her that Dr. [name] does not prescribe Cpap supplies at all and that they would need to contact his specialist. Nothing further needed.

## 2019-02-13 NOTE — Telephone Encounter (Signed)
Copied from [place] [phone]. Topic: General - Other >> Feb 12, 2019 11:27 AM Celene Kras A wrote: Reason for CRM: Joe, [facility], calling about pts cpap machine. He is requesting to speak with pts PCP or nurse. Please advise.

## 2019-02-17 ENCOUNTER — Encounter: Payer: Self-pay | Admitting: Family Medicine

## 2019-03-19 ENCOUNTER — Other Ambulatory Visit: Payer: Self-pay | Admitting: Family Medicine

## 2019-03-19 DIAGNOSIS — E291 Testicular hypofunction: Secondary | ICD-10-CM

## 2019-03-22 NOTE — Progress Notes (Signed)
Cardiology Office Note   Date:  03/23/2019   ID:  David Brandt, DOB July 20, 1945, MRN [id number]  PCP:  [name], MD  Cardiologist:   Skeet Latch, MD  [facility] APP: Kerin Ransom, PA-C  No chief complaint on file.    History of Present Illness: David Brandt is a 73 y.o. male with CAD s/p CABG (LIMA-->LAD, SVG-->D1,D2, SVG-->OM, SVG-->RCA) , diabetes, OSA on CPAP, post-operative atrial fibrillation, hypertension, emphysema, and hyperlipidemia who presents for follow.   David Brandt initially presented with chest pressure and shortness of breath 03/2016.  He underwent Lexiscan Myoview on 03/28/16 that revealed LVEF 48% with a large inferior and inferior lateral MI and no evidence of ischemia. There was also basal to mid inferior wall akinesis.  He subsequently underwent left heart catheterization where he was found to have severe three-vessel CAD.  He underwent CABG with Dr. Servando Snare on 04/02/16.  He developed post-operative atrial fibrillation and was started on amiodarone.  There were were no other postoperative complications and he was discharged 04/11/16.  Echo 03/30/16 revealed LVEF 55-60% and was otherwise unremarkable.  Amiodarone was discontinued 07/2016.    Since his last appointment David Brandt has been feeling OK.  He notices that his breathing has been worse.  It seems that is worse in the evenings.  He uses his inhaler which seems to help.  He has no associated chest pain or pressure.  He does sometimes get some sharp chest pain over his left chest.  It is a stabbing sensation that last for a moment or 2.  It seems to occur with deep breathing or certain movements.  It never occurs with exertion.  He denies any lower extremity edema, orthopnea, or PND.  He notes that he has been eating more lately and thinks that this is the cause of his 15 pound weight gain.  He also has no longer been going to the gym.  He was going for 5 times per week prior to the coronavirus.    Past Medical History:  Diagnosis Date  . Abnormal cardiovascular stress test 03/29/2016  . Allergic rhinitis   . Allergy   . Arthritis   . Asthma   . Diabetes ([place]) 03/29/2016  . Gout   . Hyperlipidemia   . Hypertension   . Sleep apnea    cpap    Past Surgical History:  Procedure Laterality Date  . CARDIAC CATHETERIZATION N/A 03/29/2016   Procedure: Right/Left Heart Cath and Coronary Angiography;  Surgeon: Nelva Bush, MD;  Location: [place] CV LAB;  Service: Cardiovascular;  Laterality: N/A;  . CARDIAC CATHETERIZATION N/A 03/29/2016   Procedure: Intravascular Pressure Wire/FFR Study;  Surgeon: Nelva Bush, MD;  Location: [place] CV LAB;  Service: Cardiovascular;  Laterality: N/A;  . CARDIAC CATHETERIZATION Left 04/02/2016   Procedure: FEMORAL ARTERIAL LINE INSERTION;  Surgeon: [name], MD;  Location: [place];  Service: Open Heart Surgery;  Laterality: Left;  . COLONOSCOPY  02/20/2018   per Dr. Carlean Purl, no polyps, repeat in 5 yrs (previous adenomatous polyps)   . CORONARY ARTERY BYPASS GRAFT N/A 04/02/2016   Procedure: CORONARY ARTERY BYPASS GRAFTING (CABG) X 5 UTILIZING LEFT INTERNAL MAMMARY ARTERY AND ENDOSCOPICALLY HARVESTED GREATER SAPHENOUS VEIN ; Mammary to LAD, Sequencial 1st to 2nd Diagonal, Vein to OM, Vein to Distal RCA.;  Surgeon: [name], MD;  Location: [place];  Service: Open Heart Surgery;  Laterality: N/A;  . LYMPH NODE BIOPSY Left 04/02/2016   Procedure: INCIDENTAL LEFT  MAMMARY LYMPH NODE BIOPSY;  Surgeon: [name], MD;  Location: [place];  Service: Open Heart Surgery;  Laterality: Left;  . mass abdomen  1955  . TEE WITHOUT CARDIOVERSION N/A 04/02/2016   Procedure: TRANSESOPHAGEAL ECHOCARDIOGRAM (TEE);  Surgeon: [name], MD;  Location: [place];  Service: Open Heart Surgery;  Laterality: N/A;     Current Outpatient Medications  Medication Sig Dispense Refill  . aspirin EC 81 MG tablet Take 81 mg by mouth daily.    Marland Kitchen  atorvastatin (LIPITOR) 40 MG tablet TAKE 1 TABLET BY MOUTH EVERY DAY IN THE EVENING 90 tablet 3  . atorvastatin (LIPITOR) 80 MG tablet TAKE 0.5 TABLETS (40 MG TOTAL) BY MOUTH EVERY EVENING. 30 tablet 5  . CHANTIX CONTINUING MONTH PAK 1 MG tablet TAKE 1 TABLET BY MOUTH TWICE A DAY 56 tablet 1  . Cholecalciferol (VITAMIN D3) 2000 units TABS Take 2,000 Units by mouth daily.    . ciprofloxacin (CIPRO) 500 MG tablet Take 1 tablet (500 mg total) by mouth 2 (two) times daily. 20 tablet 0  . felodipine (PLENDIL) 10 MG 24 hr tablet TAKE 1 TABLET BY MOUTH EVERY DAY 90 tablet 2  . hydrochlorothiazide (MICROZIDE) 12.5 MG capsule TAKE 1 CAPSULE BY MOUTH EVERY DAY 90 capsule 3  . metFORMIN (GLUCOPHAGE-XR) 500 MG 24 hr tablet TAKE 1 TABLET BY MOUTH EVERY DAY 90 tablet 3  . metoprolol succinate (TOPROL-XL) 50 MG 24 hr tablet TAKE 1 TABLET (50 MG TOTAL) BY MOUTH DAILY. TAKE WITH OR IMMEDIATELY FOLLOWING A MEAL. 90 tablet 3  . OVER THE COUNTER MEDICATION Take 2 capsules by mouth 2 (two) times daily. OmegaXL    . SYMBICORT 160-4.5 MCG/ACT inhaler TAKE 2 PUFFS BY MOUTH TWICE A DAY 30.6 Inhaler 3  . testosterone cypionate (DEPOTESTOSTERONE CYPIONATE) 200 MG/ML injection INJECT 1 ML INTRAMUSCULARLY EVERY 14 DAYS 6 mL 5  . varenicline (CHANTIX STARTING MONTH PAK) 0.5 MG X 11 & 1 MG X 42 tablet Take one 0.5 mg tablet by mouth once daily for 3 days, then increase to one 0.5 mg tablet twice daily for 4 days, then increase to one 1 mg tablet twice daily. 53 tablet 0   Current Facility-Administered Medications  Medication Dose Route Frequency Provider Last Rate Last Dose  . 0.9 %  sodium chloride infusion  500 mL Intravenous Once Gatha Mayer, MD        Allergies:   Allopurinol, Valsartan-hydrochlorothiazide, and Sulfa antibiotics    Social History:  The patient  reports that he has been smoking cigarettes. He has a 47.00 pack-year smoking history. He has never used smokeless tobacco. He reports current alcohol use of  about 4.0 standard drinks of alcohol per week. He reports current drug use. Frequency: 7.00 times per week. Drug: Marijuana.   Family History:  [name] family history includes Colon cancer (age of onset: 81) in his father; Diabetes in his maternal uncle and mother; Heart disease in his maternal aunt; Stroke in his mother.    ROS:  Please see the history of present illness.   Otherwise, review of systems are positive for none.   All other systems are reviewed and negative.    PHYSICAL EXAM: VS:  BP (!) 156/82   Pulse 75   Temp 97.9 F (36.6 C)   Ht 5\' 9"  (1.753 m)   Wt (!) 330 lb (149.7 kg)   SpO2 90%   BMI 48.73 kg/m  , BMI Body mass index is 48.73 kg/m.  GENERAL:  Well appearing HEENT: Pupils equal round and reactive, fundi not visualized, oral mucosa unremarkable NECK:  No jugular venous distention, waveform within normal limits, carotid upstroke brisk and symmetric, no bruits LUNGS:  Clear to auscultation bilaterally HEART:  RRR.  PMI not displaced or sustained,S1 and S2 within normal limits, no S3, no S4, no clicks, no rubs, no murmurs ABD:  Flat, positive bowel sounds normal in frequency in pitch, no bruits, no rebound, no guarding, no midline pulsatile mass, no hepatomegaly, no splenomegaly EXT:  2 plus pulses throughout, no edema, no cyanosis no clubbing SKIN:  No rashes no nodules NEURO:  Cranial nerves II through XII grossly intact, motor grossly intact throughout PSYCH:  Cognitively intact, oriented to person place and time   EKG:  EKG is not ordered today. The ekg ordered 05/02/16 demonstrates sinus rhythm. Rate 65 bpm.  12/09/17: Sinus rhythm.  Rate 68 bpm.  Prior septal infarct.  Inferolateral TWI 03/23/19: Sinus rhythm.  Rate 75 bpm.  Lexiscan Myoview 03/29/16: Nuclear stress EF: 48%.  The left ventricular ejection fraction is mildly decreased (45-54%).   Large inferior and inferior lateral wall MI from apex to base with no ischemia EF 48% with mid and basal  inferior wall akinesis   LHC 03/29/16: Conclusions: 1. Severe 3 vessel coronary artery disease, as detailed below, including 50% distal LMCA/ostial LAD stenoses (FFR 0.70), 80% D1 lesion, 80% ostial LCx stenosis, and diffusely diseased RCA with 99% mid RCA stenosis with TIMI-1 flow and competitive filling of the PDA and rPL branches by left-to-right collaterals. 2. Mildly decreased LV systolic function with inferior akinesis (LVEF 45-50%) 3. Upper normal to mildly elevated left heart filling pressures. 4. Reduced Fick cardiac output.   Echo  03/30/16:  Study Conclusions  - Left ventricle: The cavity size was normal. Wall thickness was   increased in a pattern of moderate LVH. Systolic function was   normal. The estimated ejection fraction was in the range of 55%   to 60%. Left ventricular diastolic function parameters were   normal. - Left atrium: The atrium was mildly dilated. - Atrial septum: No defect or patent foramen ovale was identified. - Pulmonary arteries: PA peak pressure: 34 mm Hg (S).  Recent Labs: No results found for requested labs within last 8760 hours.    Lipid Panel    Component Value Date/Time   CHOL 121 03/11/2018 0910   TRIG 161 (H) 03/11/2018 0910   HDL 37 (L) 03/11/2018 0910   CHOLHDL 3.3 03/11/2018 0910   CHOLHDL 4.1 03/30/2016 0156   VLDL 59 (H) 03/30/2016 0156   LDLCALC 52 03/11/2018 0910      Wt Readings from Last 3 Encounters:  03/23/19 (!) 330 lb (149.7 kg)  11/18/18 (!) 317 lb (143.8 kg)  09/09/18 (!) 318 lb (144.2 kg)      ASSESSMENT AND PLAN:  # CAD s/p CABG:  # Hyperlipidemia:   # Shortness of breath: David Brandt has shortness of breath that is unclear whether it is cardiac or respiratory. I suspect it is more respiratory as it improves with inhalers.  We will get an echo and Lexiscan Myoview.  If those are unremarkable he will need PFTs.  Continue aspirin, atorvastatin, and metoporlol.  He was encouraged to get back into his  exercise routine.  Continue aspirin, atorvastatin, and metoprolol.  We will check fasting lipids and CMP in 1 week.  # Hypertension:  Blood pressure is above goal.  Likely because he is no longer exercising.  Continue felodipine and metoprolol.  Increase HCTZ to 25mg  daily.  Check CMP in 1 week.  # Post-op atrial fibrillation: No recurrence since stopping amiodarone.  Continue metoprolol.  # OSA: Encouraged continued use of CPAP.  # Morbid obesity: We discussed following a Mediterranean diet.  We also talked about reducing his snacks and portions.  We will refer him to our care guide to discuss nutrition further.   Current medicines are reviewed at length with the patient today.  The patient does not have concerns regarding medicines.  The following changes have been made:  no change  Labs/ tests ordered today include:   No orders of the defined types were placed in this encounter.    Disposition:   FU with David Brandt C. David Linsey, MD, [facility] in 6 months.  PharmD in 4-6 weeks.     Signed, David Brandt C. David Linsey, MD, [facility]  03/23/2019 10:21 AM    [place]

## 2019-03-23 ENCOUNTER — Encounter: Payer: Self-pay | Admitting: Cardiovascular Disease

## 2019-03-23 ENCOUNTER — Other Ambulatory Visit: Payer: Self-pay

## 2019-03-23 ENCOUNTER — Ambulatory Visit (INDEPENDENT_AMBULATORY_CARE_PROVIDER_SITE_OTHER): Payer: Medicare PPO | Admitting: Cardiovascular Disease

## 2019-03-23 VITALS — BP 156/82 | HR 75 | Temp 97.9°F | Ht 69.0 in | Wt 330.0 lb

## 2019-03-23 DIAGNOSIS — I10 Essential (primary) hypertension: Secondary | ICD-10-CM | POA: Diagnosis not present

## 2019-03-23 DIAGNOSIS — R0602 Shortness of breath: Secondary | ICD-10-CM | POA: Diagnosis not present

## 2019-03-23 DIAGNOSIS — Z951 Presence of aortocoronary bypass graft: Secondary | ICD-10-CM | POA: Diagnosis not present

## 2019-03-23 DIAGNOSIS — Z5181 Encounter for therapeutic drug level monitoring: Secondary | ICD-10-CM

## 2019-03-23 DIAGNOSIS — E7800 Pure hypercholesterolemia, unspecified: Secondary | ICD-10-CM | POA: Diagnosis not present

## 2019-03-23 MED ORDER — HYDROCHLOROTHIAZIDE 25 MG PO TABS: 25.0000 mg | ORAL_TABLET | Freq: Every day | ORAL | 3 refills | Status: DC

## 2019-03-23 NOTE — Patient Instructions (Signed)
Medication Instructions:  INCREASE YOUR HYDROCHLOROTHIAZIDE TO 25 MG DAILY   If you need a refill on your cardiac medications before your next appointment, please call your pharmacy.   Lab work: FASTING LP/CMET IN 1 WEEK  If you have labs (blood work) drawn today and your tests are completely normal, you will receive your results only by: Marland Kitchen MyChart Message (if you have MyChart) OR . A paper copy in the mail If you have any lab test that is abnormal or we need to change your treatment, we will call you to review the results.  Testing/Procedures: Your physician has requested that you have an echocardiogram. Echocardiography is a painless test that uses sound waves to create images of your heart. It provides your doctor with information about the size and shape of your heart and how well your heart's chambers and valves are working. This procedure takes approximately one hour. There are no restrictions for this procedure.  Your physician has requested that you have a lexiscan myoview. For further information please visit [email or web address]. Please follow instruction sheet, as given. 2 DAY STUDY AT [facility] ST   Follow-Up: At [facility], you and your health needs are our priority.  As part of our continuing mission to provide you with exceptional heart care, we have created designated Provider Care Teams.  These Care Teams include your primary Cardiologist (physician) and Advanced Practice Providers (APPs -  Physician Assistants and Nurse Practitioners) who all work together to provide you with the care you need, when you need it. You will need a follow up appointment in 3 months    You may see Skeet Latch, MD or one of the following Advanced Practice Providers on your designated Care Team:   Kerin Ransom, PA-C Roby Lofts, [place] . Sande Rives, PA-C  Your physician recommends that you schedule a follow-up appointment in: 4-6 [place]

## 2019-03-24 ENCOUNTER — Telehealth (HOSPITAL_COMMUNITY): Payer: Self-pay

## 2019-03-24 DIAGNOSIS — E291 Testicular hypofunction: Secondary | ICD-10-CM | POA: Insufficient documentation

## 2019-03-24 NOTE — Telephone Encounter (Signed)
He will need to check a level first. I put in the order

## 2019-03-24 NOTE — Telephone Encounter (Signed)
Spoke with the patient, instructions were given. He stated that he would be here for his test. Asked to call back with any questions. S.Oronde Hallenbeck EMTP 

## 2019-03-24 NOTE — Telephone Encounter (Signed)
Pt last OV was   Pt last refill was   Okay for refill?  Please advise

## 2019-03-25 NOTE — Telephone Encounter (Signed)
Pt notified and has been scheduled 

## 2019-03-26 ENCOUNTER — Other Ambulatory Visit: Payer: Self-pay

## 2019-03-26 ENCOUNTER — Ambulatory Visit (HOSPITAL_COMMUNITY): Payer: Medicare PPO | Attending: Cardiology

## 2019-03-26 DIAGNOSIS — Z951 Presence of aortocoronary bypass graft: Secondary | ICD-10-CM | POA: Diagnosis not present

## 2019-03-26 DIAGNOSIS — I10 Essential (primary) hypertension: Secondary | ICD-10-CM

## 2019-03-26 DIAGNOSIS — R0602 Shortness of breath: Secondary | ICD-10-CM | POA: Diagnosis not present

## 2019-03-26 MED ORDER — TECHNETIUM TC 99M TETROFOSMIN IV KIT
33.0000 | PACK | Freq: Once | INTRAVENOUS | Status: AC | PRN
  Administered 2019-03-26: 33 via INTRAVENOUS
  Filled 2019-03-26: qty 33

## 2019-03-26 MED ORDER — REGADENOSON 0.4 MG/5ML IV SOLN
0.4000 mg | Freq: Once | INTRAVENOUS | Status: AC
  Administered 2019-03-26: 0.4 mg via INTRAVENOUS

## 2019-03-27 ENCOUNTER — Ambulatory Visit (HOSPITAL_COMMUNITY): Payer: Medicare PPO

## 2019-03-27 LAB — MYOCARDIAL PERFUSION IMAGING
LV dias vol: 112 mL (ref 62–150)
LV sys vol: 47 mL
Peak HR: 82 {beats}/min
Rest HR: 74 {beats}/min
SDS: 1
SRS: 0
SSS: 1
TID: 1.07

## 2019-03-27 MED ORDER — TECHNETIUM TC 99M TETROFOSMIN IV KIT
32.0000 | PACK | Freq: Once | INTRAVENOUS | Status: AC | PRN
  Administered 2019-03-27: 32 via INTRAVENOUS
  Filled 2019-03-27: qty 32

## 2019-03-30 ENCOUNTER — Other Ambulatory Visit: Payer: Self-pay

## 2019-03-30 ENCOUNTER — Ambulatory Visit (HOSPITAL_COMMUNITY): Payer: Medicare PPO | Attending: Cardiology

## 2019-03-30 ENCOUNTER — Other Ambulatory Visit (INDEPENDENT_AMBULATORY_CARE_PROVIDER_SITE_OTHER): Payer: Medicare PPO

## 2019-03-30 DIAGNOSIS — I10 Essential (primary) hypertension: Secondary | ICD-10-CM | POA: Diagnosis not present

## 2019-03-30 DIAGNOSIS — Z5181 Encounter for therapeutic drug level monitoring: Secondary | ICD-10-CM | POA: Diagnosis not present

## 2019-03-30 DIAGNOSIS — E7800 Pure hypercholesterolemia, unspecified: Secondary | ICD-10-CM | POA: Diagnosis not present

## 2019-03-30 DIAGNOSIS — R0602 Shortness of breath: Secondary | ICD-10-CM

## 2019-03-30 DIAGNOSIS — E291 Testicular hypofunction: Secondary | ICD-10-CM | POA: Diagnosis not present

## 2019-03-30 LAB — COMPREHENSIVE METABOLIC PANEL
ALT: 27 IU/L (ref 0–44)
AST: 33 IU/L (ref 0–40)
Albumin/Globulin Ratio: 1.6 (ref 1.2–2.2)
Albumin: 4.2 g/dL (ref 3.7–4.7)
Alkaline Phosphatase: 125 IU/L — ABNORMAL HIGH (ref 39–117)
BUN/Creatinine Ratio: 13 (ref 10–24)
BUN: 22 mg/dL (ref 8–27)
Bilirubin Total: 1 mg/dL (ref 0.0–1.2)
CO2: 23 mmol/L (ref 20–29)
Calcium: 9.7 mg/dL (ref 8.6–10.2)
Chloride: 95 mmol/L — ABNORMAL LOW (ref 96–106)
Creatinine, Ser: 1.65 mg/dL — ABNORMAL HIGH (ref 0.76–1.27)
GFR calc Af Amer: 48 mL/min/{1.73_m2} — ABNORMAL LOW (ref >59)
GFR calc non Af Amer: 41 mL/min/{1.73_m2} — ABNORMAL LOW (ref >59)
Globulin, Total: 2.6 g/dL (ref 1.5–4.5)
Glucose: 126 mg/dL — ABNORMAL HIGH (ref 65–99)
Potassium: 3.3 mmol/L — ABNORMAL LOW (ref 3.5–5.2)
Sodium: 136 mmol/L (ref 134–144)
Total Protein: 6.8 g/dL (ref 6.0–8.5)

## 2019-03-30 LAB — LIPID PANEL
Chol/HDL Ratio: 3.1 ratio (ref 0.0–5.0)
Cholesterol, Total: 116 mg/dL (ref 100–199)
HDL: 37 mg/dL — ABNORMAL LOW (ref >39)
LDL Chol Calc (NIH): 45 mg/dL (ref 0–99)
Triglycerides: 213 mg/dL — ABNORMAL HIGH (ref 0–149)
VLDL Cholesterol Cal: 34 mg/dL (ref 5–40)

## 2019-03-30 LAB — TESTOSTERONE: Testosterone: 554.36 ng/dL (ref 300.00–890.00)

## 2019-03-31 ENCOUNTER — Telehealth: Payer: Self-pay | Admitting: *Deleted

## 2019-03-31 DIAGNOSIS — H40003 Preglaucoma, unspecified, bilateral: Secondary | ICD-10-CM | POA: Diagnosis not present

## 2019-03-31 DIAGNOSIS — E119 Type 2 diabetes mellitus without complications: Secondary | ICD-10-CM | POA: Diagnosis not present

## 2019-03-31 LAB — HM DIABETES EYE EXAM

## 2019-03-31 MED ORDER — CARVEDILOL 25 MG PO TABS: 25.0000 mg | ORAL_TABLET | Freq: Two times a day (BID) | ORAL | 3 refills | Status: DC

## 2019-03-31 MED ORDER — HYDROCHLOROTHIAZIDE 12.5 MG PO TABS: 12.5000 mg | ORAL_TABLET | Freq: Every day | ORAL | 3 refills | Status: DC

## 2019-03-31 NOTE — Telephone Encounter (Signed)
Advised patient of results, decreasing HCTZ back to 12.5 mg daily, stopping Metoprolol and starting Carvedilol. Patient verbalized understanding.

## 2019-03-31 NOTE — Telephone Encounter (Signed)
-----   Message from Skeet Latch, MD sent at 03/30/2019  3:25 PM EDT ----- Echo shows that his heart squeezes well but does not relax completely.  This is a mild change and will not cause symptoms unless it worsens.  It will be important to keep his blood pressure under good control. Lets see if increasing the HCTZ helps.

## 2019-03-31 NOTE — Telephone Encounter (Signed)
-----   Message from Skeet Latch, MD sent at 03/30/2019  4:50 PM EDT ----- Cholesterol is very good other than his triglycerides which are elevated.  Kidney function is worse and potassium is low.  This is probably because of the higher dose of HCTZ.  Lets go back to 12.5mg  on the HCTZ.  Stop metoprolol and start carvedilol 25mg  bid instead.

## 2019-03-31 NOTE — Telephone Encounter (Signed)
-----   Message from Skeet Latch, MD sent at 03/30/2019  3:25 PM EDT ----- Low risk stress test.

## 2019-04-01 ENCOUNTER — Other Ambulatory Visit: Payer: Self-pay | Admitting: *Deleted

## 2019-04-01 DIAGNOSIS — E291 Testicular hypofunction: Secondary | ICD-10-CM

## 2019-04-01 MED ORDER — TESTOSTERONE CYPIONATE 200 MG/ML IM SOLN: INTRAMUSCULAR | 5 refills | Status: DC

## 2019-04-04 ENCOUNTER — Emergency Department (HOSPITAL_COMMUNITY)
Admission: EM | Admit: 2019-04-04 | Discharge: 2019-04-04 | Disposition: A | Discharge status: Home / Self Care | Payer: Medicare PPO | Attending: Emergency Medicine | Admitting: Emergency Medicine

## 2019-04-04 ENCOUNTER — Emergency Department (HOSPITAL_COMMUNITY): Payer: Medicare PPO

## 2019-04-04 ENCOUNTER — Encounter (HOSPITAL_COMMUNITY): Payer: Self-pay

## 2019-04-04 ENCOUNTER — Other Ambulatory Visit: Payer: Self-pay

## 2019-04-04 DIAGNOSIS — N201 Calculus of ureter: Secondary | ICD-10-CM | POA: Insufficient documentation

## 2019-04-04 DIAGNOSIS — I10 Essential (primary) hypertension: Secondary | ICD-10-CM | POA: Insufficient documentation

## 2019-04-04 DIAGNOSIS — R1032 Left lower quadrant pain: Secondary | ICD-10-CM | POA: Diagnosis present

## 2019-04-04 DIAGNOSIS — J449 Chronic obstructive pulmonary disease, unspecified: Secondary | ICD-10-CM | POA: Insufficient documentation

## 2019-04-04 DIAGNOSIS — E119 Type 2 diabetes mellitus without complications: Secondary | ICD-10-CM | POA: Insufficient documentation

## 2019-04-04 DIAGNOSIS — I2510 Atherosclerotic heart disease of native coronary artery without angina pectoris: Secondary | ICD-10-CM | POA: Diagnosis not present

## 2019-04-04 DIAGNOSIS — F17210 Nicotine dependence, cigarettes, uncomplicated: Secondary | ICD-10-CM | POA: Insufficient documentation

## 2019-04-04 DIAGNOSIS — R0602 Shortness of breath: Secondary | ICD-10-CM | POA: Insufficient documentation

## 2019-04-04 DIAGNOSIS — K869 Disease of pancreas, unspecified: Secondary | ICD-10-CM | POA: Insufficient documentation

## 2019-04-04 DIAGNOSIS — J45909 Unspecified asthma, uncomplicated: Secondary | ICD-10-CM | POA: Diagnosis not present

## 2019-04-04 DIAGNOSIS — E278 Other specified disorders of adrenal gland: Secondary | ICD-10-CM | POA: Diagnosis not present

## 2019-04-04 DIAGNOSIS — N132 Hydronephrosis with renal and ureteral calculous obstruction: Secondary | ICD-10-CM | POA: Diagnosis not present

## 2019-04-04 DIAGNOSIS — E279 Disorder of adrenal gland, unspecified: Secondary | ICD-10-CM | POA: Insufficient documentation

## 2019-04-04 LAB — CBC WITH DIFFERENTIAL/PLATELET
Band Neutrophils: 0 %
Basophils Absolute: 0 10*3/uL (ref 0.0–0.1)
Basophils Relative: 0 %
Eosinophils Absolute: 0 10*3/uL (ref 0.0–0.5)
Eosinophils Relative: 0 %
HCT: 62.7 % — ABNORMAL HIGH (ref 39.0–52.0)
Hemoglobin: 20.2 g/dL — ABNORMAL HIGH (ref 13.0–17.0)
Lymphocytes Relative: 5 %
Lymphs Abs: 0.9 10*3/uL (ref 0.7–4.0)
MCH: 30.4 pg (ref 26.0–34.0)
MCHC: 32.3 g/dL (ref 30.0–36.0)
MCV: 94 fL (ref 80.0–100.0)
Monocytes Absolute: 1.3 10*3/uL — ABNORMAL HIGH (ref 0.1–1.0)
Monocytes Relative: 7 %
Neutro Abs: 16.3 10*3/uL — ABNORMAL HIGH (ref 1.7–7.7)
Neutrophils Relative %: 87 %
Platelets: 193 10*3/uL (ref 150–400)
RBC: 6.65 MIL/uL — ABNORMAL HIGH (ref 4.22–5.81)
RDW: 14.3 % (ref 11.5–15.5)
WBC: 18.9 10*3/uL — ABNORMAL HIGH (ref 4.0–10.5)
nRBC: 0 % (ref 0.0–0.2)

## 2019-04-04 LAB — COMPREHENSIVE METABOLIC PANEL
ALT: 30 U/L (ref 0–44)
AST: 35 U/L (ref 15–41)
Albumin: 4.2 g/dL (ref 3.5–5.0)
Alkaline Phosphatase: 98 U/L (ref 38–126)
Anion gap: 11 (ref 5–15)
BUN: 22 mg/dL (ref 8–23)
CO2: 28 mmol/L (ref 22–32)
Calcium: 9.6 mg/dL (ref 8.9–10.3)
Chloride: 94 mmol/L — ABNORMAL LOW (ref 98–111)
Creatinine, Ser: 1.92 mg/dL — ABNORMAL HIGH (ref 0.61–1.24)
GFR calc Af Amer: 40 mL/min — ABNORMAL LOW (ref >60)
GFR calc non Af Amer: 34 mL/min — ABNORMAL LOW (ref >60)
Glucose, Bld: 121 mg/dL — ABNORMAL HIGH (ref 70–99)
Potassium: 3.5 mmol/L (ref 3.5–5.1)
Sodium: 133 mmol/L — ABNORMAL LOW (ref 135–145)
Total Bilirubin: 1.5 mg/dL — ABNORMAL HIGH (ref 0.3–1.2)
Total Protein: 8 g/dL (ref 6.5–8.1)

## 2019-04-04 LAB — URINALYSIS, ROUTINE W REFLEX MICROSCOPIC
Bacteria, UA: NONE SEEN
Bilirubin Urine: NEGATIVE
Glucose, UA: NEGATIVE mg/dL
Ketones, ur: NEGATIVE mg/dL
Nitrite: NEGATIVE
Protein, ur: 30 mg/dL — AB
RBC / HPF: >50 RBC/hpf — ABNORMAL HIGH (ref 0–5)
Specific Gravity, Urine: 1.015 (ref 1.005–1.030)
pH: 7 (ref 5.0–8.0)

## 2019-04-04 LAB — LIPASE, BLOOD: Lipase: 39 U/L (ref 11–51)

## 2019-04-04 MED ORDER — OXYCODONE-ACETAMINOPHEN 5-325 MG PO TABS
1.0000 | ORAL_TABLET | Freq: Once | ORAL | Status: AC
  Administered 2019-04-04: 22:00:00 1 via ORAL
  Filled 2019-04-04: qty 1

## 2019-04-04 MED ORDER — SODIUM CHLORIDE (PF) 0.9 % IJ SOLN
INTRAMUSCULAR | Status: AC
  Filled 2019-04-04: qty 50

## 2019-04-04 MED ORDER — ONDANSETRON HCL 4 MG/2ML IJ SOLN
4.0000 mg | Freq: Once | INTRAMUSCULAR | Status: AC
  Administered 2019-04-04: 17:00:00 4 mg via INTRAVENOUS
  Filled 2019-04-04: qty 2

## 2019-04-04 MED ORDER — MORPHINE SULFATE (PF) 4 MG/ML IV SOLN
4.0000 mg | Freq: Once | INTRAVENOUS | Status: AC
  Administered 2019-04-04: 4 mg via INTRAVENOUS
  Filled 2019-04-04: qty 1

## 2019-04-04 MED ORDER — OXYCODONE-ACETAMINOPHEN 5-325 MG PO TABS: 1.0000 | ORAL_TABLET | Freq: Four times a day (QID) | ORAL | 0 refills | Status: DC | PRN

## 2019-04-04 MED ORDER — IOHEXOL 300 MG/ML  SOLN
100.0000 mL | Freq: Once | INTRAMUSCULAR | Status: AC | PRN
  Administered 2019-04-04: 20:00:00 100 mL via INTRAVENOUS

## 2019-04-04 MED ORDER — ONDANSETRON 4 MG PO TBDP: 4.0000 mg | ORAL_TABLET | Freq: Three times a day (TID) | ORAL | 0 refills | Status: DC | PRN

## 2019-04-04 NOTE — ED Provider Notes (Signed)
Emergency Department Provider Note   I have reviewed the triage vital signs and the nursing notes.   HISTORY  Chief Complaint Abdominal Pain   HPI David Brandt is a 73 y.o. male with PMH of DM, HTN, HLD, CAD, and elevated BMI presents to the emergency department for evaluation of abdominal pain which is now localized in the left lower quadrant.  Pain began at 5 AM this morning.  Patient took Pepto-Bismol and felt like shortly after taking this his pain seemed to move to his left flank and lower abdomen.  He denies any diarrhea, nausea, vomiting, fever.  He states that the pain in his abdomen "seems to be affecting my breathing" but denies chest pain or diaphoresis.  No COVID-19 contact. No sore throat or congestion. No other modifying factors.    Past Medical History:  Diagnosis Date  . Abnormal cardiovascular stress test 03/29/2016  . Allergic rhinitis   . Allergy   . Arthritis   . Asthma   . Diabetes ([place]) 03/29/2016  . Gout   . Hyperlipidemia   . Hypertension   . Sleep apnea    cpap    Patient Active Problem List   Diagnosis Date Noted  . 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 Morbid obesity ([place]) 11/18/2018  . Dyslipidemia 12/04/2017  . Type 2 diabetes mellitus with other specified complication ([place]) 99991111  . 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 COPD, moderate ([place]) 03/31/2016  . Angina decubitus ([place]) 03/29/2016  . Unstable angina ([place])   . Essential hypertension 07/15/2015  . Hx of adenomatous colonic polyps 09/14/2014  . OSA (obstructive sleep apnea) 11/28/2010    Past Surgical History:  Procedure Laterality Date  . CARDIAC CATHETERIZATION N/A 03/29/2016   Procedure: Right/Left Heart Cath and Coronary Angiography;  Surgeon: Nelva Bush, MD;  Location: [facility];  Service: Cardiovascular;  Laterality: N/A;  . CARDIAC CATHETERIZATION N/A 03/29/2016   Procedure: Intravascular Pressure Wire/FFR Study;  Surgeon: Nelva Bush, MD;  Location: [facility];  Service: Cardiovascular;  Laterality: N/A;  . CARDIAC CATHETERIZATION Left 04/02/2016   Procedure: FEMORAL ARTERIAL LINE INSERTION;  Surgeon: [name], MD;  Location: [facility];  Service: Open Heart Surgery;  Laterality: Left;  . COLONOSCOPY  02/20/2018   per Dr. Carlean Purl, no polyps, repeat in 5 yrs (previous adenomatous polyps)   . CORONARY ARTERY BYPASS GRAFT N/A 04/02/2016   Procedure: CORONARY ARTERY BYPASS GRAFTING (CABG) X 5 UTILIZING LEFT INTERNAL MAMMARY ARTERY AND ENDOSCOPICALLY HARVESTED GREATER SAPHENOUS VEIN ; Mammary to LAD, Sequencial 1st to 2nd Diagonal, Vein to OM, Vein to Distal RCA.;  Surgeon: [name], MD;  Location: [facility];  Service: Open Heart Surgery;  Laterality: N/A;  . LYMPH NODE BIOPSY Left 04/02/2016   Procedure: INCIDENTAL LEFT MAMMARY LYMPH NODE BIOPSY;  Surgeon: [name], MD;  Location: [facility];  Service: Open Heart Surgery;  Laterality: Left;  . mass abdomen  1955  . TEE WITHOUT CARDIOVERSION N/A 04/02/2016   Procedure: TRANSESOPHAGEAL ECHOCARDIOGRAM (TEE);  Surgeon: [name], MD;  Location: [facility];  Service: Open Heart Surgery;  Laterality: N/A;    Allergies Allopurinol, Valsartan-hydrochlorothiazide, and Sulfa antibiotics  Family History  Problem Relation Age of Onset  . Colon cancer Father 66  . Stroke Mother   . Diabetes Mother   . Heart disease Maternal Aunt   . Diabetes Maternal Uncle   . Stomach cancer Neg Hx   . Esophageal cancer Neg Hx   .  Rectal cancer Neg Hx     Social History Social History   Tobacco Use  . Smoking status: Current Every Day Smoker    Packs/day: 1.00    Years: 47.00    Pack years: 47.00    Types: Cigarettes    Last attempt to quit: 01/24/2016    Years since quitting: 3.1  . Smokeless tobacco: Never Used  Substance Use Topics  . Alcohol use: Yes    Alcohol/week: 4.0 standard drinks    Types: 4 Cans of beer per week  . Drug use: Yes    Frequency: 7.0 times per week    Types: Marijuana     Comment: marijuana    Review of Systems  Constitutional: No fever/chills Eyes: No visual changes. ENT: No sore throat. Cardiovascular: Denies chest pain. Respiratory: Positive shortness of breath. Gastrointestinal: Positive LLQ abdominal pain.  No nausea, no vomiting.  No diarrhea.  No constipation. Genitourinary: Negative for dysuria. Musculoskeletal: Negative for back pain. Skin: Negative for rash. Neurological: Negative for headaches, focal weakness or numbness.  10-point ROS otherwise negative.  ____________________________________________   PHYSICAL EXAM:  VITAL SIGNS: ED Triage Vitals  Enc Vitals Group     BP 04/04/19 1601 (!) 162/88     Pulse Rate 04/04/19 1601 72     Resp 04/04/19 1601 18     Temp 04/04/19 1601 98.1 F (36.7 C)     Temp Source 04/04/19 1601 Oral     SpO2 04/04/19 1601 93 %   Constitutional: Alert and oriented. Well appearing and in no acute distress. Eyes: Conjunctivae are normal.  Head: Atraumatic. Nose: No congestion/rhinnorhea. Mouth/Throat: Mucous membranes are moist.  Neck: No stridor.  Cardiovascular: Normal rate, regular rhythm. Good peripheral circulation. Grossly normal heart sounds.   Respiratory: Normal respiratory effort.  No retractions. Lungs CTAB. Gastrointestinal: Soft with mild LLQ tenderness. No rebound or guarding. Positive distention/central obesity.  Musculoskeletal: No gross deformities of extremities. Neurologic:  Normal speech and language. No gross focal neurologic deficits are appreciated.  Skin:  Skin is warm, dry and intact. No rash noted.  ____________________________________________   LABS (all labs ordered are listed, but only abnormal results are displayed)  Labs Reviewed  COMPREHENSIVE METABOLIC PANEL - Abnormal; Notable for the following components:      Result Value   Sodium 133 (*)    Chloride 94 (*)    Glucose, Bld 121 (*)    Creatinine, Ser 1.92 (*)    Total Bilirubin 1.5 (*)    GFR calc non  Af Amer 34 (*)    GFR calc Af Amer 40 (*)    All other components within normal limits  CBC WITH DIFFERENTIAL/PLATELET - Abnormal; Notable for the following components:   WBC 18.9 (*)    RBC 6.65 (*)    Hemoglobin 20.2 (*)    HCT 62.7 (*)    Neutro Abs 16.3 (*)    Monocytes Absolute 1.3 (*)    All other components within normal limits  URINALYSIS, ROUTINE W REFLEX MICROSCOPIC - Abnormal; Notable for the following components:   Hgb urine dipstick MODERATE (*)    Protein, ur 30 (*)    Leukocytes,Ua TRACE (*)    RBC / HPF >50 (*)    All other components within normal limits  URINE CULTURE  LIPASE, BLOOD   ____________________________________________  EKG  EKG reviewed. Sinus rhythm. Non-specific ST changes similar to prior. No STEMI.  ____________________________________________  RADIOLOGY  Dg Chest 2 View  Result Date: 04/04/2019 CLINICAL  DATA:  Shortness of breath. EXAM: CHEST - 2 VIEW COMPARISON:  August 02, 2016 FINDINGS: Post median sternotomy with breakage of the first, fourth and 6th mediastinal wires. Cardiomediastinal silhouette is normal. Mediastinal contours appear intact. Mild diffuse haziness of the lung parenchyma. Osseous structures are without acute abnormality. Soft tissues are grossly normal. IMPRESSION: 1. Mild diffuse haziness of the lung parenchyma may represent early interstitial edema. 2. Post median sternotomy changes with breakage of the first, fourth and 6th mediastinal wires. Electronically Signed   By: Fidela Salisbury M.D.   On: 04/04/2019 17:08   Ct Abdomen Pelvis W Contrast  Result Date: 04/04/2019 CLINICAL DATA:  He c/o persistent llq area abd EXAM: CT ABDOMEN AND PELVIS WITH CONTRAST TECHNIQUE: Multidetector CT imaging of the abdomen and pelvis was performed using the standard protocol following bolus administration of intravenous contrast. CONTRAST:  161mL OMNIPAQUE IOHEXOL 300 MG/ML  SOLN COMPARISON:  None. FINDINGS: Lower chest: Minimal  bibasilar atelectasis no pleural effusion. Hepatobiliary: No focal liver abnormality is seen. No gallstones, gallbladder wall thickening, or biliary dilatation. Pancreas: There is a 5 mm hypodense lesion in the pancreatic head likely a cyst. No main pancreatic duct dilation. No surrounding inflammatory changes. Spleen: Normal in size without focal abnormality. Adrenals/Urinary Tract: Indeterminate 1.6 cm right adrenal mass. There is left adrenal nodular thickening, likely hyperplasia. 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 Multiple large right renal stones measuring up to 1.7 cm inferiorly. Right parapelvic cyst. No right hydronephrosis. There is moderate left hydronephrosis and perinephric and peri ureteral fat stranding approximately secondary to an elongated 2.3 cm stone in the proximal left ureter. The left renal cortex is slightly hypoattenuated compared to the right renal cortex. The urinary bladder is unremarkable. Stomach/Bowel: Stomach is within normal limits. Appendix appears normal. No evidence of bowel wall thickening, distention, or inflammatory changes. Scattered colonic diverticula without evidence of diverticulitis. 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 Reproductive: Prostate is unremarkable. Other: No abdominal wall hernia or abnormality. No abdominopelvic ascites. 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 IMPRESSION: 1. Moderate left hydronephrosis with perinephric and peri ureteral inflammatory fat stranding secondary to an elongated 2.3 cm stone in the proximal left ureter. Decreased attenuation of the left renal cortex compared to the right. 2. 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 3. Multiple large right renal stones measuring up to 1.7 cm inferiorly. 4.  Indeterminate 1.6 cm right adrenal mass. Left adrenal nodular thickening likely hyperplasia. Recommended dedicated renal protocol CT or MR on a nonemergent basis. 5. 5 mm hypodense lesion in the pancreatic head, likely a cyst. Follow-up CT is recommended in 2 years to confirm stability. 6. 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 7. Multilevel severe degenerative changes in the lumbar spine. Electronically Signed   By: Audie Pinto M.D.   On: 04/04/2019 20:12    ____________________________________________   PROCEDURES  Procedure(s) performed:   Procedures  None ____________________________________________   INITIAL IMPRESSION / ASSESSMENT AND PLAN / ED COURSE  Pertinent labs & imaging results that were available during my care of the patient were reviewed by me and considered in my medical decision making (see chart for details).   Patient presents to the emergency department for evaluation of left lower quadrant abdominal pain.  He has mild tenderness on exam.  My suspicion for vascular pathology is very low such as AAA or aortic dissection.  Patient does describe some associated shortness of breath which may be pain related.  We will treat the symptoms and obtain chest x-ray along with screening EKG.  Primarily plan to pursue abdominal labs and imaging with CT.   Patient with elongated proximal kidney stone on the left with moderate hydronephrosis. No infection on UA. Pain well controlled here. Patient will need urgent Urology follow up but is clear for ED discharge. Sulfa allergy so cannot prescribe Flomax. Placed ambulatory referral to Urology and patient will call on Monday. Discussed earlier ED return precautions in detail. Discussed incidental findings on CT as well.  ____________________________________________  FINAL CLINICAL IMPRESSION(S) / ED DIAGNOSES  Final diagnoses:   Ureterolithiasis  Right adrenal mass (H[facility])  Pancreatic lesion     MEDICATIONS GIVEN DURING THIS VISIT:  Medications  morphine 4 MG/ML injection 4 mg (4 mg Intravenous Given 04/04/19 1712)  ondansetron (ZOFRAN) injection 4 mg (4 mg Intravenous Given 04/04/19 1712)  iohexol (OMNIPAQUE) 300 MG/ML solution 100 mL (100 mLs Intravenous Contrast Given 04/04/19 1934)  oxyCODONE-acetaminophen (PERCOCET/ROXICET) 5-325 MG per tablet 1 tablet (1 tablet Oral Given 04/04/19 2205)     NEW OUTPATIENT MEDICATIONS STARTED DURING THIS VISIT:  Discharge Medication List as of 04/04/2019  9:41 PM    START taking these medications   Details  ondansetron (ZOFRAN ODT) 4 MG disintegrating tablet Take 1 tablet (4 mg total) by mouth every 8 (eight) hours as needed for nausea or vomiting., Starting Sat 04/04/2019, Normal    oxyCODONE-acetaminophen (PERCOCET/ROXICET) 5-325 MG tablet Take 1 tablet by mouth every 6 (six) hours as needed for severe pain., Starting Sat 04/04/2019, Normal        Note:  This document was prepared using Dragon voice recognition software and may include unintentional dictation errors.  Nanda Quinton, MD, [facility]    Lillyana Majette, [facility], MD 04/05/19 1059

## 2019-04-04 NOTE — ED Triage Notes (Signed)
He c/o persistent llq area abd. Pain which began at ~0500 hours today. He states earlier the pain also was peri umbilical, however, it is now localized to llq. He denies diarrhea/n/v/fever and is in no distress.

## 2019-04-04 NOTE — Discharge Instructions (Signed)
You were seen in the emergency department today with a large left kidney stone.  It is very important that you see the urologist soon.  I have placed a referral but needs you to call the urology clinic on Monday morning.  Take the pain medication as needed for severe pain.  I have provided nausea medication as well.  You should return to the emergency department immediately if you develop severe worsening pain, vomiting, fever.   Your CT scan showed a mass in your right adrenal gland and lesion on your pancreas.  These are not likely causing your pain but you should discuss these results with your primary care doctor who can request the CT report and arrange follow-up as indicated.

## 2019-04-06 DIAGNOSIS — N202 Calculus of kidney with calculus of ureter: Secondary | ICD-10-CM | POA: Diagnosis not present

## 2019-04-06 LAB — URINE CULTURE: Culture: NO GROWTH

## 2019-04-07 ENCOUNTER — Other Ambulatory Visit: Payer: Self-pay | Admitting: Urology

## 2019-04-07 ENCOUNTER — Telehealth: Payer: Self-pay | Admitting: Cardiovascular Disease

## 2019-04-07 NOTE — Telephone Encounter (Signed)
Called and spoke with [name] and informed him to hold his ASA 5-7 days prior to the procedure and to restart as soon as possible afterwards. Patient verbalized an understanding and all (if any) were answered.

## 2019-04-07 NOTE — Telephone Encounter (Signed)
° °  [facility] Pre-operative Risk Assessment    Request for surgical clearance:  1. What type of surgery is being performed?  Surgery for Kidney Stone  When is this surgery scheduled? 04-24-19  2. What type of clearance is required (medical clearance vs. Pharmacy clearance to hold med vs. Both)? Both 3.  4. Are there any medications that need to be held prior to surgery and how long? Aspirin   5. Practice name and name of physician performing surgery? Dr Louis Meckel  6. What is your office phone number [phone]    7.   What is your office fax number [phone]   8.   Anesthesia type (None, local, MAC, general) ? General  David Brandt 04/07/2019, 9:28 AM  _________________________________________________________________   (provider comments below)

## 2019-04-07 NOTE — Telephone Encounter (Signed)
   Primary Cardiologist: Skeet Latch, MD  Chart reviewed as part of pre-operative protocol coverage. Given past medical history and time since last visit, based on ACC/AHA guidelines, David Brandt would be at acceptable risk for the planned procedure without further cardiovascular testing. Recent echo and stress test were normal.   I will route this recommendation to the requesting party via Epic fax function and remove from pre-op pool.  Please call with questions. Ok to hold aspirin for 5-7 days prior to the procedure and restart as soon as possible afterward.   [name], [place] 04/07/2019, 3:54 PM

## 2019-04-08 DIAGNOSIS — G4733 Obstructive sleep apnea (adult) (pediatric): Secondary | ICD-10-CM | POA: Diagnosis not present

## 2019-04-16 NOTE — Patient Instructions (Signed)
DUE TO COVID-19 ONLY ONE VISITOR IS ALLOWED TO COME WITH YOU AND STAY IN THE WAITING ROOM ONLY DURING PRE OP AND PROCEDURE DAY OF SURGERY. THE 1 VISITOR MAY VISIT WITH YOU AFTER SURGERY IN YOUR PRIVATE ROOM DURING VISITING HOURS ONLY!  YOU NEED TO HAVE A COVID 19 TEST ON__10/27/2020_____ @_______ , THIS TEST MUST BE DONE BEFORE SURGERY, COME  [address] , [address].  ([place]) ONCE YOUR COVID TEST IS COMPLETED, PLEASE BEGIN THE QUARANTINE INSTRUCTIONS AS OUTLINED IN YOUR HANDOUT.                [facility]    Your procedure is scheduled on: 04/24/2019   Report to [facility] Main  Entrance   Report to short stay at 0525 AM     Call this number if you have problems the morning of surgery [phone]    Remember: Do not eat food or drink liquids :After Midnight. BRUSH YOUR TEETH MORNING OF SURGERY AND RINSE YOUR MOUTH OUT, NO CHEWING GUM CANDY OR MINTS.     Take these medicines the morning of surgery with A SIP OF WATER: Coreg, Percocet, Symbicort, Zofran.  DO NOT TAKE ANY DIABETIC MEDICATIONS DAY OF YOUR SURGERY                               You may not have any metal on your body including hair pins and              piercings  Do not wear jewelry, make-up, lotions, powders or perfumes, deodorant             Do not wear nail polish on your fingernails.  Do not shave  48 hours prior to surgery.              Men may shave face and neck.   Do not bring valuables to the hospital. [place].  Contacts, dentures or bridgework may not be worn into surgery.  Leave suitcase in the car. After surgery it may be brought to your room.     Patients discharged the day of surgery will not be allowed to drive home. IF YOU ARE HAVING SURGERY AND GOING HOME THE SAME DAY, YOU MUST HAVE AN ADULT TO DRIVE YOU HOME AND BE WITH YOU FOR 24 HOURS. YOU MAY GO HOME BY TAXI OR UBER OR ORTHERWISE, BUT AN ADULT  MUST ACCOMPANY YOU HOME AND STAY WITH YOU FOR 24 HOURS.  Name and phone number of your driver:  Special Instructions: N/A              Please read over the following fact sheets you were given: _____________________________________________________________________  [facility] - Preparing for Surgery Before surgery, you can play an important role.  Because skin is not sterile, your skin needs to be as free of germs as possible.  You can reduce the number of germs on your skin by washing with CHG (chlorahexidine gluconate) soap before surgery.  CHG is an antiseptic cleaner which kills germs and bonds with the skin to continue killing germs even after washing. Please DO NOT use if you have an allergy to CHG or antibacterial soaps.  If your skin becomes reddened/irritated stop using the CHG and inform your nurse when you arrive at Short Stay. Do  not shave (including legs and underarms) for at least 48 hours prior to the first CHG shower.  You may shave your face/neck.  Please follow these instructions carefully:  1.  Shower with CHG Soap the night before surgery and the  morning of surgery.  2.  If you choose to wash your hair, wash your hair first as usual with your normal  shampoo.  3.  After you shampoo, rinse your hair and body thoroughly to remove the shampoo.                             4.  Use CHG as you would any other liquid soap.  You can apply chg directly to the skin and wash.  Gently with a scrungie or clean washcloth.  5.  Apply the CHG Soap to your body ONLY FROM THE NECK DOWN.   Do   not use on face/ open                           Wound or open sores. Avoid contact with eyes, ears mouth and   genitals (private parts).                       Wash face,  Genitals (private parts) with your normal soap.             6.  Wash thoroughly, paying special attention to the area where your    surgery  will be performed.  7.  Thoroughly rinse your body with warm water from the neck down.  8.  DO  NOT shower/wash with your normal soap after using and rinsing off the CHG Soap.                9.  Pat yourself dry with a clean towel.            10.  Wear clean pajamas.            11.  Place clean sheets on your bed the night of your first shower and do not  sleep with pets. Day of Surgery : Do not apply any lotions/deodorants the morning of surgery.  Please wear clean clothes to the hospital/surgery center.  FAILURE TO FOLLOW THESE INSTRUCTIONS MAY RESULT IN THE CANCELLATION OF YOUR SURGERY  PATIENT SIGNATURE_________________________________  NURSE SIGNATURE__________________________________  ________________________________________________________________________

## 2019-04-21 ENCOUNTER — Other Ambulatory Visit: Payer: Self-pay

## 2019-04-21 ENCOUNTER — Encounter (HOSPITAL_COMMUNITY): Payer: Self-pay

## 2019-04-21 ENCOUNTER — Encounter (HOSPITAL_COMMUNITY)
Admission: RE | Admit: 2019-04-21 | Discharge: 2019-04-21 | Disposition: A | Discharge status: Home / Self Care | Payer: Medicare PPO | Source: Ambulatory Visit | Attending: Urology | Admitting: Urology

## 2019-04-21 ENCOUNTER — Other Ambulatory Visit (HOSPITAL_COMMUNITY): Payer: Medicare PPO

## 2019-04-21 ENCOUNTER — Other Ambulatory Visit (HOSPITAL_COMMUNITY)
Admission: RE | Admit: 2019-04-21 | Discharge: 2019-04-21 | Disposition: A | Discharge status: Home / Self Care | Payer: Medicare PPO | Source: Ambulatory Visit | Attending: Urology | Admitting: Urology

## 2019-04-21 DIAGNOSIS — Z951 Presence of aortocoronary bypass graft: Secondary | ICD-10-CM | POA: Insufficient documentation

## 2019-04-21 DIAGNOSIS — N200 Calculus of kidney: Secondary | ICD-10-CM | POA: Diagnosis not present

## 2019-04-21 DIAGNOSIS — Z01812 Encounter for preprocedural laboratory examination: Secondary | ICD-10-CM | POA: Diagnosis not present

## 2019-04-21 DIAGNOSIS — Z20828 Contact with and (suspected) exposure to other viral communicable diseases: Secondary | ICD-10-CM | POA: Insufficient documentation

## 2019-04-21 DIAGNOSIS — N201 Calculus of ureter: Secondary | ICD-10-CM | POA: Diagnosis not present

## 2019-04-21 DIAGNOSIS — I25810 Atherosclerosis of coronary artery bypass graft(s) without angina pectoris: Secondary | ICD-10-CM | POA: Diagnosis not present

## 2019-04-21 HISTORY — DX: Personal history of urinary calculi: Z87.442

## 2019-04-21 LAB — COMPREHENSIVE METABOLIC PANEL
ALT: 22 U/L (ref 0–44)
AST: 20 U/L (ref 15–41)
Albumin: 3.7 g/dL (ref 3.5–5.0)
Alkaline Phosphatase: 89 U/L (ref 38–126)
Anion gap: 11 (ref 5–15)
BUN: 20 mg/dL (ref 8–23)
CO2: 25 mmol/L (ref 22–32)
Calcium: 9.1 mg/dL (ref 8.9–10.3)
Chloride: 99 mmol/L (ref 98–111)
Creatinine, Ser: 1.79 mg/dL — ABNORMAL HIGH (ref 0.61–1.24)
GFR calc Af Amer: 43 mL/min — ABNORMAL LOW (ref >60)
GFR calc non Af Amer: 37 mL/min — ABNORMAL LOW (ref >60)
Glucose, Bld: 100 mg/dL — ABNORMAL HIGH (ref 70–99)
Potassium: 4 mmol/L (ref 3.5–5.1)
Sodium: 135 mmol/L (ref 135–145)
Total Bilirubin: 1.1 mg/dL (ref 0.3–1.2)
Total Protein: 7.4 g/dL (ref 6.5–8.1)

## 2019-04-21 LAB — ABO/RH: ABO/RH(D): B POS

## 2019-04-21 LAB — CBC
HCT: 57.1 % — ABNORMAL HIGH (ref 39.0–52.0)
Hemoglobin: 18.1 g/dL — ABNORMAL HIGH (ref 13.0–17.0)
MCH: 29.8 pg (ref 26.0–34.0)
MCHC: 31.7 g/dL (ref 30.0–36.0)
MCV: 94.1 fL (ref 80.0–100.0)
Platelets: 245 10*3/uL (ref 150–400)
RBC: 6.07 MIL/uL — ABNORMAL HIGH (ref 4.22–5.81)
RDW: 13.2 % (ref 11.5–15.5)
WBC: 11.9 10*3/uL — ABNORMAL HIGH (ref 4.0–10.5)
nRBC: 0 % (ref 0.0–0.2)

## 2019-04-21 LAB — GLUCOSE, CAPILLARY: Glucose-Capillary: 107 mg/dL — ABNORMAL HIGH (ref 70–99)

## 2019-04-21 LAB — HEMOGLOBIN A1C
Hgb A1c MFr Bld: 6.8 % — ABNORMAL HIGH (ref 4.8–5.6)
Mean Plasma Glucose: 148.46 mg/dL

## 2019-04-21 NOTE — Progress Notes (Signed)
PCP - Dr. Alysia Penna Cardiologist - Dr. Skeet Latch  Chest x-ray - 04/04/2019 EKG - 04/06/2019 Stress Test - 03/27/2019 ECHO - 04/09/2019 Cardiac Cath - CABG 2015  Sleep Study - Yes-year unknown CPAP - Yes  Fasting Blood Sugar - 112/120 CBG at PST appt-107 Checks Blood Sugar __Rarely___ times a day  Blood Thinner Instructions:n/a Aspirin Instructions:ASA hold 5-7 days prior to surgery. Note in Epic. Last Dose:04/19/2019  Anesthesia review: Yes, given to Konrad Felix, PA for review.  Patient denies shortness of breath, fever, cough and chest pain at PAT appointment   Patient verbalized understanding of instructions that were given to them at the PAT appointment. Patient was also instructed that they will need to review over the PAT instructions again at home before surgery.

## 2019-04-21 NOTE — Progress Notes (Signed)
SPOKE W/  _ Aron     SCREENING SYMPTOMS OF COVID 19:   COUGH--NO  RUNNY NOSE--- NO  SORE THROAT---NO  NASAL CONGESTION----NO  SNEEZING----NO  SHORTNESS OF BREATH---NO  DIFFICULTY BREATHING---NO  TEMP >100.0 -----NO  UNEXPLAINED BODY ACHES------NO CHILLS -------- NO  HEADACHES ---------NO  LOSS OF SMELL/ TASTE --------NO    HAVE YOU OR ANY FAMILY MEMBER TRAVELLED PAST 14 DAYS OUT OF THE   COUNTY---NO STATE----NO COUNTRY----NO  HAVE YOU OR ANY FAMILY MEMBER BEEN EXPOSED TO ANYONE WITH COVID 19? NO

## 2019-04-22 LAB — URINE CULTURE: Culture: <10000 — AB

## 2019-04-22 LAB — NOVEL CORONAVIRUS, NAA (HOSP ORDER, SEND-OUT TO REF LAB; TAT 18-24 HRS): SARS-CoV-2, NAA: NOT DETECTED

## 2019-04-22 NOTE — Anesthesia Preprocedure Evaluation (Addendum)
Anesthesia Evaluation  Patient identified by MRN, date of birth, ID band Patient awake    Reviewed: Allergy & Precautions, H&P , NPO status , Patient's Chart, lab work & pertinent test results  Airway Mallampati: II   Neck ROM: full    Dental   Pulmonary asthma , sleep apnea , COPD, Current Smoker,    breath sounds clear to auscultation       Cardiovascular hypertension, + angina  Rhythm:regular Rate:Normal     Neuro/Psych    GI/Hepatic   Endo/Other  diabetes, Type 2Morbid obesity  Renal/GU Renal InsufficiencyRenal disease     Musculoskeletal  (+) Arthritis ,   Abdominal   Peds  Hematology   Anesthesia Other Findings   Reproductive/Obstetrics                            Anesthesia Physical Anesthesia Plan  ASA: III  Anesthesia Plan: General   Post-op Pain Management:    Induction: Intravenous  PONV Risk Score and Plan: 1 and Ondansetron, Dexamethasone, Midazolam and Treatment may vary due to age or medical condition  Airway Management Planned: Oral ETT  Additional Equipment:   Intra-op Plan:   Post-operative Plan: Extubation in OR  Informed Consent: I have reviewed the patients History and Physical, chart, labs and discussed the procedure including the risks, benefits and alternatives for the proposed anesthesia with the patient or authorized representative who has indicated his/her understanding and acceptance.       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 Cardiology clearance per telephone encounter 04/07/19 "Given past medical history and time since last visit, based on ACC/AHA guidelines, Demorris Janis would be at acceptable risk for the planned procedure without further cardiovascular testing. Recent echo and stress test were normal. I will route this recommendation to the  requesting party via Epic fax function and remove from pre-op pool. Please call with questions. Ok to hold aspirin for 5-7 days prior to the procedure and restart as soon as possible afterward."  Seen at ED 04/04/19 for abd pain and diagnosed with elongated proximal kidney stone on the left with moderate hydronephrosis. Stable elevated creatinine. DMII well controlled, A1c 6.8.  Lab Results      Component                Value               Date                      CREATININE               1.79 (H)            04/21/2019                CREATININE               1.92 (H)            04/04/2019                CREATININE               1.65 (H)            03/30/2019            TTE 03/30/19:  1. Left ventricular ejection fraction, by visual estimation, is 65 to 70%. The left ventricle has normal  function. Normal left ventricular size. There is no left ventricular hypertrophy.  2. Left ventricular diastolic Doppler parameters are consistent with pseudonormalization pattern of LV diastolic filling.  3. Global right ventricle has normal systolic function.The right ventricular size is normal. No increase in right ventricular wall thickness.  4. Left atrial size was normal.  5. Right atrial size was normal.  6. The mitral valve is normal in structure. No evidence of mitral valve regurgitation. No evidence of mitral stenosis.  7. The tricuspid valve is normal in structure. Tricuspid valve regurgitation was not visualized by color flow Doppler.  8. The aortic valve is normal in structure. Aortic valve regurgitation was not visualized by color flow Doppler. Mild aortic valve sclerosis without stenosis.  9. The pulmonic valve was normal in structure. Pulmonic valve regurgitation is not visualized by color flow Doppler. 10. The inferior vena cava is normal in size with greater than 50% respiratory variability, suggesting right atrial pressure of 3 mmHg.  Nuclear stress 03/27/19:  Nuclear stress EF: 58%.  There  was no ST segment deviation noted during stress.  The study is normal.  This is a low risk study.  The left ventricular ejection fraction is normal (55-65%).   Normal stress nuclear study with no ischemia or infarction.  Gated ejection fraction 58% with normal wall motion.)       Anesthesia Quick Evaluation

## 2019-04-22 NOTE — Progress Notes (Signed)
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 Cardiology clearance per telephone encounter 04/07/19 "Given past medical history and time since last visit, based on ACC/AHA guidelines, David Brandt would be at acceptable risk for the planned procedure without further cardiovascular testing. Recent echo and stress test were normal. I will route this recommendation to the requesting party via Epic fax function and remove from pre-op pool. Please call with questions. Ok to hold aspirin for 5-7 days prior to the procedure and restart as soon as possible afterward."  Seen at ED 04/04/19 for abd pain and diagnosed with elongated proximal kidney stone on the left with moderate hydronephrosis. Stable elevated creatinine. DMII well controlled, A1c 6.8.  Lab Results      Component                Value               Date                      CREATININE               1.79 (H)            04/21/2019                CREATININE               1.92 (H)            04/04/2019                CREATININE               1.65 (H)            03/30/2019            TTE 03/30/19:  1. Left ventricular ejection fraction, by visual estimation, is 65 to 70%. The left ventricle has normal function. Normal left ventricular size. There is no left ventricular hypertrophy.  2. Left ventricular diastolic Doppler parameters are consistent with pseudonormalization pattern of LV diastolic filling.  3. Global right ventricle has normal systolic function.The right ventricular size is normal. No increase in right ventricular wall thickness.  4. Left atrial size was normal.  5. Right atrial size was normal.  6. The mitral valve is normal in structure. No evidence of mitral valve regurgitation. No evidence of mitral stenosis.  7. The tricuspid valve is normal in structure. Tricuspid valve regurgitation was not visualized by color flow Doppler.  8. The aortic valve is normal in structure. Aortic valve regurgitation  was not visualized by color flow Doppler. Mild aortic valve sclerosis without stenosis.  9. The pulmonic valve was normal in structure. Pulmonic valve regurgitation is not visualized by color flow Doppler. 10. The inferior vena cava is normal in size with greater than 50% respiratory variability, suggesting right atrial pressure of 3 mmHg.  Nuclear stress 03/27/19:  Nuclear stress EF: 58%.  There was no ST segment deviation noted during stress.  The study is normal.  This is a low risk study.  The left ventricular ejection fraction is normal (55-65%).   Normal stress nuclear study with no ischemia or infarction.  Gated ejection fraction 58% with normal wall motion.   [name] [facility]/Anesthesiology Phone [phone] 04/22/2019 4:06 PM

## 2019-04-23 MED ORDER — DEXTROSE 5 % IV SOLN
3.0000 g | INTRAVENOUS | Status: AC
  Administered 2019-04-24: 3 g via INTRAVENOUS
  Filled 2019-04-23: qty 3

## 2019-04-24 ENCOUNTER — Other Ambulatory Visit: Payer: Self-pay

## 2019-04-24 ENCOUNTER — Ambulatory Visit (HOSPITAL_COMMUNITY): Payer: Medicare PPO | Admitting: Registered Nurse

## 2019-04-24 ENCOUNTER — Observation Stay (HOSPITAL_COMMUNITY)
Admission: AD | Admit: 2019-04-24 | Discharge: 2019-04-25 | Disposition: A | Discharge status: Home / Self Care | Payer: Medicare PPO | Attending: Urology | Admitting: Urology

## 2019-04-24 ENCOUNTER — Observation Stay (HOSPITAL_COMMUNITY): Payer: Medicare PPO

## 2019-04-24 ENCOUNTER — Ambulatory Visit (HOSPITAL_COMMUNITY): Payer: Medicare PPO

## 2019-04-24 ENCOUNTER — Encounter (HOSPITAL_COMMUNITY)
Admission: AD | Disposition: A | Discharge status: Home / Self Care | Payer: Self-pay | Source: Home / Self Care | Attending: Urology

## 2019-04-24 ENCOUNTER — Ambulatory Visit (HOSPITAL_COMMUNITY): Payer: Medicare PPO | Admitting: Vascular Surgery

## 2019-04-24 ENCOUNTER — Encounter (HOSPITAL_COMMUNITY): Payer: Self-pay | Admitting: *Deleted

## 2019-04-24 DIAGNOSIS — M1990 Unspecified osteoarthritis, unspecified site: Secondary | ICD-10-CM | POA: Insufficient documentation

## 2019-04-24 DIAGNOSIS — J449 Chronic obstructive pulmonary disease, unspecified: Secondary | ICD-10-CM | POA: Insufficient documentation

## 2019-04-24 DIAGNOSIS — Z8249 Family history of ischemic heart disease and other diseases of the circulatory system: Secondary | ICD-10-CM | POA: Insufficient documentation

## 2019-04-24 DIAGNOSIS — Z7984 Long term (current) use of oral hypoglycemic drugs: Secondary | ICD-10-CM | POA: Insufficient documentation

## 2019-04-24 DIAGNOSIS — Z79899 Other long term (current) drug therapy: Secondary | ICD-10-CM | POA: Diagnosis not present

## 2019-04-24 DIAGNOSIS — Z951 Presence of aortocoronary bypass graft: Secondary | ICD-10-CM | POA: Insufficient documentation

## 2019-04-24 DIAGNOSIS — N209 Urinary calculus, unspecified: Secondary | ICD-10-CM | POA: Diagnosis present

## 2019-04-24 DIAGNOSIS — Z87442 Personal history of urinary calculi: Secondary | ICD-10-CM | POA: Diagnosis not present

## 2019-04-24 DIAGNOSIS — Z882 Allergy status to sulfonamides status: Secondary | ICD-10-CM | POA: Diagnosis not present

## 2019-04-24 DIAGNOSIS — E119 Type 2 diabetes mellitus without complications: Secondary | ICD-10-CM | POA: Diagnosis not present

## 2019-04-24 DIAGNOSIS — I200 Unstable angina: Secondary | ICD-10-CM | POA: Diagnosis not present

## 2019-04-24 DIAGNOSIS — Z7982 Long term (current) use of aspirin: Secondary | ICD-10-CM | POA: Insufficient documentation

## 2019-04-24 DIAGNOSIS — E7800 Pure hypercholesterolemia, unspecified: Secondary | ICD-10-CM | POA: Diagnosis not present

## 2019-04-24 DIAGNOSIS — N200 Calculus of kidney: Secondary | ICD-10-CM

## 2019-04-24 DIAGNOSIS — K5730 Diverticulosis of large intestine without perforation or abscess without bleeding: Secondary | ICD-10-CM | POA: Diagnosis not present

## 2019-04-24 DIAGNOSIS — N132 Hydronephrosis with renal and ureteral calculous obstruction: Principal | ICD-10-CM | POA: Insufficient documentation

## 2019-04-24 DIAGNOSIS — F17210 Nicotine dependence, cigarettes, uncomplicated: Secondary | ICD-10-CM | POA: Diagnosis not present

## 2019-04-24 DIAGNOSIS — K429 Umbilical hernia without obstruction or gangrene: Secondary | ICD-10-CM | POA: Insufficient documentation

## 2019-04-24 DIAGNOSIS — Z7951 Long term (current) use of inhaled steroids: Secondary | ICD-10-CM | POA: Insufficient documentation

## 2019-04-24 DIAGNOSIS — Z6841 Body Mass Index (BMI) 40.0 and over, adult: Secondary | ICD-10-CM | POA: Diagnosis not present

## 2019-04-24 DIAGNOSIS — I10 Essential (primary) hypertension: Secondary | ICD-10-CM | POA: Insufficient documentation

## 2019-04-24 DIAGNOSIS — N201 Calculus of ureter: Secondary | ICD-10-CM | POA: Diagnosis not present

## 2019-04-24 DIAGNOSIS — N202 Calculus of kidney with calculus of ureter: Secondary | ICD-10-CM | POA: Diagnosis not present

## 2019-04-24 DIAGNOSIS — Z888 Allergy status to other drugs, medicaments and biological substances status: Secondary | ICD-10-CM | POA: Insufficient documentation

## 2019-04-24 HISTORY — PX: NEPHROLITHOTOMY: SHX5134

## 2019-04-24 LAB — CBC
HCT: 56.5 % — ABNORMAL HIGH (ref 39.0–52.0)
Hemoglobin: 17.7 g/dL — ABNORMAL HIGH (ref 13.0–17.0)
MCH: 29.8 pg (ref 26.0–34.0)
MCHC: 31.3 g/dL (ref 30.0–36.0)
MCV: 95.3 fL (ref 80.0–100.0)
Platelets: 275 10*3/uL (ref 150–400)
RBC: 5.93 MIL/uL — ABNORMAL HIGH (ref 4.22–5.81)
RDW: 13.3 % (ref 11.5–15.5)
WBC: 18.3 10*3/uL — ABNORMAL HIGH (ref 4.0–10.5)
nRBC: 0 % (ref 0.0–0.2)

## 2019-04-24 LAB — GLUCOSE, CAPILLARY
Glucose-Capillary: 134 mg/dL — ABNORMAL HIGH (ref 70–99)
Glucose-Capillary: 147 mg/dL — ABNORMAL HIGH (ref 70–99)

## 2019-04-24 LAB — BASIC METABOLIC PANEL
Anion gap: 9 (ref 5–15)
BUN: 28 mg/dL — ABNORMAL HIGH (ref 8–23)
CO2: 25 mmol/L (ref 22–32)
Calcium: 9 mg/dL (ref 8.9–10.3)
Chloride: 101 mmol/L (ref 98–111)
Creatinine, Ser: 2.11 mg/dL — ABNORMAL HIGH (ref 0.61–1.24)
GFR calc Af Amer: 35 mL/min — ABNORMAL LOW (ref >60)
GFR calc non Af Amer: 31 mL/min — ABNORMAL LOW (ref >60)
Glucose, Bld: 150 mg/dL — ABNORMAL HIGH (ref 70–99)
Potassium: 4.9 mmol/L (ref 3.5–5.1)
Sodium: 135 mmol/L (ref 135–145)

## 2019-04-24 LAB — TYPE AND SCREEN
ABO/RH(D): B POS
Antibody Screen: NEGATIVE

## 2019-04-24 SURGERY — NEPHROLITHOTOMY PERCUTANEOUS
Anesthesia: General | Laterality: Left

## 2019-04-24 MED ORDER — FENTANYL CITRATE (PF) 250 MCG/5ML IJ SOLN
INTRAMUSCULAR | Status: AC
  Filled 2019-04-24: qty 5

## 2019-04-24 MED ORDER — CIPROFLOXACIN HCL 500 MG PO TABS: 500.0000 mg | ORAL_TABLET | Freq: Two times a day (BID) | ORAL | 0 refills | Status: DC

## 2019-04-24 MED ORDER — LIDOCAINE 2% (20 MG/ML) 5 ML SYRINGE
INTRAMUSCULAR | Status: DC | PRN
  Administered 2019-04-24: 80 mg via INTRAVENOUS

## 2019-04-24 MED ORDER — ONDANSETRON HCL 4 MG/2ML IJ SOLN
INTRAMUSCULAR | Status: AC
  Filled 2019-04-24: qty 2

## 2019-04-24 MED ORDER — BUPIVACAINE-EPINEPHRINE 0.25% -1:200000 IJ SOLN
INTRAMUSCULAR | Status: AC
  Filled 2019-04-24: qty 1

## 2019-04-24 MED ORDER — PANTOPRAZOLE SODIUM 40 MG IV SOLR
40.0000 mg | INTRAVENOUS | Status: DC
  Administered 2019-04-24: 40 mg via INTRAVENOUS
  Filled 2019-04-24: qty 40

## 2019-04-24 MED ORDER — FENTANYL CITRATE (PF) 100 MCG/2ML IJ SOLN
INTRAMUSCULAR | Status: AC
  Filled 2019-04-24: qty 2

## 2019-04-24 MED ORDER — DEXAMETHASONE SODIUM PHOSPHATE 10 MG/ML IJ SOLN
INTRAMUSCULAR | Status: DC | PRN
  Administered 2019-04-24: 5 mg via INTRAVENOUS

## 2019-04-24 MED ORDER — MORPHINE SULFATE (PF) 2 MG/ML IV SOLN: 2.0000 mg | INTRAVENOUS | Status: DC | PRN

## 2019-04-24 MED ORDER — ATORVASTATIN CALCIUM 40 MG PO TABS
80.0000 mg | ORAL_TABLET | Freq: Every day | ORAL | Status: DC
  Administered 2019-04-24 – 2019-04-25 (×2): 80 mg via ORAL
  Filled 2019-04-24 (×2): qty 2

## 2019-04-24 MED ORDER — IOHEXOL 300 MG/ML  SOLN
INTRAMUSCULAR | Status: DC | PRN
  Administered 2019-04-24: 07:00:00 25 mL

## 2019-04-24 MED ORDER — MOMETASONE FURO-FORMOTEROL FUM 200-5 MCG/ACT IN AERO
2.0000 | INHALATION_SPRAY | Freq: Two times a day (BID) | RESPIRATORY_TRACT | Status: DC
  Administered 2019-04-24 – 2019-04-25 (×2): 2 via RESPIRATORY_TRACT
  Filled 2019-04-24: qty 8.8

## 2019-04-24 MED ORDER — PHENYLEPHRINE HCL-NACL 10-0.9 MG/250ML-% IV SOLN
INTRAVENOUS | Status: AC
  Filled 2019-04-24: qty 250

## 2019-04-24 MED ORDER — PROPOFOL 10 MG/ML IV BOLUS
INTRAVENOUS | Status: AC
  Filled 2019-04-24: qty 40

## 2019-04-24 MED ORDER — HYDRALAZINE HCL 20 MG/ML IJ SOLN
INTRAMUSCULAR | Status: AC
  Filled 2019-04-24: qty 1

## 2019-04-24 MED ORDER — BELLADONNA ALKALOIDS-OPIUM 16.2-60 MG RE SUPP
RECTAL | Status: AC
  Filled 2019-04-24: qty 1

## 2019-04-24 MED ORDER — BELLADONNA ALKALOIDS-OPIUM 16.2-60 MG RE SUPP
1.0000 | Freq: Once | RECTAL | Status: AC
  Administered 2019-04-24: 1 via RECTAL

## 2019-04-24 MED ORDER — ROCURONIUM BROMIDE 10 MG/ML (PF) SYRINGE
PREFILLED_SYRINGE | INTRAVENOUS | Status: AC
  Filled 2019-04-24: qty 10

## 2019-04-24 MED ORDER — METFORMIN HCL ER 500 MG PO TB24
500.0000 mg | ORAL_TABLET | Freq: Every day | ORAL | Status: DC
  Administered 2019-04-25: 500 mg via ORAL
  Filled 2019-04-24: qty 1

## 2019-04-24 MED ORDER — MIDAZOLAM HCL 5 MG/5ML IJ SOLN
INTRAMUSCULAR | Status: DC | PRN
  Administered 2019-04-24: 2 mg via INTRAVENOUS

## 2019-04-24 MED ORDER — DEXAMETHASONE SODIUM PHOSPHATE 10 MG/ML IJ SOLN
INTRAMUSCULAR | Status: AC
  Filled 2019-04-24: qty 1

## 2019-04-24 MED ORDER — CIPROFLOXACIN HCL 500 MG PO TABS
500.0000 mg | ORAL_TABLET | Freq: Two times a day (BID) | ORAL | Status: DC
  Administered 2019-04-24 – 2019-04-25 (×2): 500 mg via ORAL
  Filled 2019-04-24 (×2): qty 1

## 2019-04-24 MED ORDER — MIDAZOLAM HCL 2 MG/2ML IJ SOLN
INTRAMUSCULAR | Status: AC
  Filled 2019-04-24: qty 2

## 2019-04-24 MED ORDER — FENTANYL CITRATE (PF) 100 MCG/2ML IJ SOLN
25.0000 ug | INTRAMUSCULAR | Status: DC | PRN
  Administered 2019-04-24 (×2): 50 ug via INTRAVENOUS

## 2019-04-24 MED ORDER — OXYCODONE HCL 5 MG PO TABS: 5.0000 mg | ORAL_TABLET | Freq: Once | ORAL | Status: DC | PRN

## 2019-04-24 MED ORDER — ONDANSETRON HCL 4 MG/2ML IJ SOLN: 4.0000 mg | Freq: Four times a day (QID) | INTRAMUSCULAR | Status: DC | PRN

## 2019-04-24 MED ORDER — OXYCODONE HCL 5 MG/5ML PO SOLN: 5.0000 mg | Freq: Once | ORAL | Status: DC | PRN

## 2019-04-24 MED ORDER — SUGAMMADEX SODIUM 500 MG/5ML IV SOLN
INTRAVENOUS | Status: AC
  Filled 2019-04-24: qty 5

## 2019-04-24 MED ORDER — CARVEDILOL 25 MG PO TABS
25.0000 mg | ORAL_TABLET | Freq: Two times a day (BID) | ORAL | Status: DC
  Administered 2019-04-24 – 2019-04-25 (×2): 25 mg via ORAL
  Filled 2019-04-24 (×2): qty 1

## 2019-04-24 MED ORDER — ONDANSETRON HCL 4 MG/2ML IJ SOLN
INTRAMUSCULAR | Status: DC | PRN
  Administered 2019-04-24: 4 mg via INTRAVENOUS

## 2019-04-24 MED ORDER — FELODIPINE ER 10 MG PO TB24
10.0000 mg | ORAL_TABLET | Freq: Every day | ORAL | Status: DC
  Administered 2019-04-24 – 2019-04-25 (×2): 10 mg via ORAL
  Filled 2019-04-24 (×4): qty 1

## 2019-04-24 MED ORDER — HYDRALAZINE HCL 20 MG/ML IJ SOLN
5.0000 mg | Freq: Once | INTRAMUSCULAR | Status: AC
  Administered 2019-04-24: 5 mg via INTRAVENOUS

## 2019-04-24 MED ORDER — SUGAMMADEX SODIUM 200 MG/2ML IV SOLN
INTRAVENOUS | Status: DC | PRN
  Administered 2019-04-24: 500 mg via INTRAVENOUS

## 2019-04-24 MED ORDER — BUPIVACAINE HCL (PF) 0.5 % IJ SOLN
INTRAMUSCULAR | Status: AC
  Filled 2019-04-24: qty 30

## 2019-04-24 MED ORDER — TRAMADOL HCL 50 MG PO TABS: 50.0000 mg | ORAL_TABLET | Freq: Four times a day (QID) | ORAL | Status: DC | PRN

## 2019-04-24 MED ORDER — SUCCINYLCHOLINE CHLORIDE 200 MG/10ML IV SOSY
PREFILLED_SYRINGE | INTRAVENOUS | Status: AC
  Filled 2019-04-24: qty 10

## 2019-04-24 MED ORDER — BUPIVACAINE-EPINEPHRINE 0.25% -1:200000 IJ SOLN
INTRAMUSCULAR | Status: DC | PRN
  Administered 2019-04-24: 50 mL

## 2019-04-24 MED ORDER — SUCCINYLCHOLINE CHLORIDE 200 MG/10ML IV SOSY
PREFILLED_SYRINGE | INTRAVENOUS | Status: DC | PRN
  Administered 2019-04-24: 140 mg via INTRAVENOUS

## 2019-04-24 MED ORDER — SODIUM CHLORIDE 0.9 % IR SOLN
Status: DC | PRN
  Administered 2019-04-24: 21000 mL
  Administered 2019-04-24: 6000 mL

## 2019-04-24 MED ORDER — LACTATED RINGERS IV SOLN
INTRAVENOUS | Status: DC
  Administered 2019-04-24 (×2): via INTRAVENOUS

## 2019-04-24 MED ORDER — LIDOCAINE 2% (20 MG/ML) 5 ML SYRINGE
INTRAMUSCULAR | Status: AC
  Filled 2019-04-24: qty 5

## 2019-04-24 MED ORDER — HYDROCHLOROTHIAZIDE 25 MG PO TABS
12.5000 mg | ORAL_TABLET | Freq: Every day | ORAL | Status: DC
  Administered 2019-04-25: 12.5 mg via ORAL
  Filled 2019-04-24: qty 1

## 2019-04-24 MED ORDER — FENTANYL CITRATE (PF) 250 MCG/5ML IJ SOLN
INTRAMUSCULAR | Status: DC | PRN
  Administered 2019-04-24 (×2): 50 ug via INTRAVENOUS
  Administered 2019-04-24: 100 ug via INTRAVENOUS
  Administered 2019-04-24: 50 ug via INTRAVENOUS

## 2019-04-24 MED ORDER — SODIUM CHLORIDE 0.45 % IV SOLN
INTRAVENOUS | Status: DC
  Administered 2019-04-24: 17:00:00 via INTRAVENOUS

## 2019-04-24 MED ORDER — TRAMADOL HCL 50 MG PO TABS: 50.0000 mg | ORAL_TABLET | Freq: Four times a day (QID) | ORAL | 0 refills | Status: DC | PRN

## 2019-04-24 MED ORDER — CHLORHEXIDINE GLUCONATE CLOTH 2 % EX PADS
6.0000 | MEDICATED_PAD | Freq: Every day | CUTANEOUS | Status: DC
  Administered 2019-04-24: 22:00:00 6 via TOPICAL

## 2019-04-24 MED ORDER — ROCURONIUM BROMIDE 10 MG/ML (PF) SYRINGE
PREFILLED_SYRINGE | INTRAVENOUS | Status: DC | PRN
  Administered 2019-04-24: 60 mg via INTRAVENOUS
  Administered 2019-04-24 (×4): 20 mg via INTRAVENOUS

## 2019-04-24 MED ORDER — PROPOFOL 10 MG/ML IV BOLUS
INTRAVENOUS | Status: DC | PRN
  Administered 2019-04-24: 180 mg via INTRAVENOUS

## 2019-04-24 SURGICAL SUPPLY — 65 items
ADH SKN CLS APL DERMABOND .7 (GAUZE/BANDAGES/DRESSINGS) ×1
APL PRP STRL LF DISP 70% ISPRP (MISCELLANEOUS) ×1
APL SKNCLS STERI-STRIP NONHPOA (GAUZE/BANDAGES/DRESSINGS) ×1
BAG DRN RND TRDRP ANRFLXCHMBR (UROLOGICAL SUPPLIES) ×1
BAG URINE DRAIN 2000ML AR STRL (UROLOGICAL SUPPLIES) ×2 IMPLANT
BASKET STONE NCOMPASS (UROLOGICAL SUPPLIES) IMPLANT
BASKET ZERO TIP NITINOL 2.4FR (BASKET) ×1 IMPLANT
BENZOIN TINCTURE PRP APPL 2/3 (GAUZE/BANDAGES/DRESSINGS) ×2 IMPLANT
BLADE SURG 15 STRL LF DISP TIS (BLADE) ×1 IMPLANT
BLADE SURG 15 STRL SS (BLADE) ×2
BSKT STON RTRVL ZERO TP 2.4FR (BASKET) ×1
CATH AINSWORTH 30CC 24FR (CATHETERS) ×1 IMPLANT
CATH FOLEY 2WAY SLVR  5CC 16FR (CATHETERS) ×1
CATH FOLEY 2WAY SLVR 5CC 16FR (CATHETERS) ×1 IMPLANT
CATH IMAGER II 65CM (CATHETERS) ×2 IMPLANT
CATH URET 5FR 28IN OPEN ENDED (CATHETERS) ×2 IMPLANT
CATH URET DUAL LUMEN 6-10FR 50 (CATHETERS) ×2 IMPLANT
CATH X-FORCE N30 NEPHROSTOMY (TUBING) ×2 IMPLANT
CHLORAPREP W/TINT 26 (MISCELLANEOUS) ×2 IMPLANT
COVER WAND RF STERILE (DRAPES) IMPLANT
DERMABOND ADVANCED (GAUZE/BANDAGES/DRESSINGS) ×1
DERMABOND ADVANCED .7 DNX12 (GAUZE/BANDAGES/DRESSINGS) IMPLANT
DRAPE C-ARM 42X120 X-RAY (DRAPES) ×2 IMPLANT
DRAPE LINGEMAN PERC (DRAPES) ×2 IMPLANT
DRSG PAD ABDOMINAL 8X10 ST (GAUZE/BANDAGES/DRESSINGS) ×4 IMPLANT
DRSG TEGADERM 8X12 (GAUZE/BANDAGES/DRESSINGS) ×3 IMPLANT
EXTRACTOR STONE 1.7FRX115CM (UROLOGICAL SUPPLIES) IMPLANT
FIBER LASER FLEXIVA 365 (UROLOGICAL SUPPLIES) IMPLANT
FIBER LASER TRAC TIP (UROLOGICAL SUPPLIES) IMPLANT
GAUZE SPONGE 4X4 12PLY STRL (GAUZE/BANDAGES/DRESSINGS) ×2 IMPLANT
GLOVE BIOGEL M STRL SZ7.5 (GLOVE) ×2 IMPLANT
GOWN STRL REUS W/TWL XL LVL3 (GOWN DISPOSABLE) ×2 IMPLANT
GUIDEWIRE AMPLAZ .035X145 (WIRE) ×4 IMPLANT
GUIDEWIRE ANG ZIPWIRE 038X150 (WIRE) IMPLANT
GUIDEWIRE STR DUAL SENSOR (WIRE) ×4 IMPLANT
GUIDEWIRE ZIPWRE .038 STRAIGHT (WIRE) ×1 IMPLANT
HLDR NDL AMPLATZ W/INSERTS (MISCELLANEOUS) IMPLANT
HOLDER NEEDLE AMPLATZ W/INSERT (MISCELLANEOUS) ×2 IMPLANT
IV SET EXTENSION CATH 6 NF (IV SETS) ×2 IMPLANT
KIT BASIN OR (CUSTOM PROCEDURE TRAY) ×2 IMPLANT
KIT PROBE 340X3.4XDISP GRN (MISCELLANEOUS) IMPLANT
KIT PROBE TRILOGY 3.4X340 (MISCELLANEOUS)
KIT PROBE TRILOGY 3.9X350 (MISCELLANEOUS) ×1 IMPLANT
KIT TURNOVER KIT A (KITS) ×1 IMPLANT
MANIFOLD NEPTUNE II (INSTRUMENTS) ×2 IMPLANT
NDL SPNL 20GX3.5 QUINCKE YW (NEEDLE) IMPLANT
NDL TROCAR 18X15 ECHO (NEEDLE) IMPLANT
NDL TROCAR 18X20 (NEEDLE) IMPLANT
NEEDLE SPNL 20GX3.5 QUINCKE YW (NEEDLE) IMPLANT
NEEDLE TROCAR 18X15 ECHO (NEEDLE) IMPLANT
NEEDLE TROCAR 18X20 (NEEDLE) ×2 IMPLANT
NS IRRIG 1000ML POUR BTL (IV SOLUTION) ×2 IMPLANT
PACK CYSTO (CUSTOM PROCEDURE TRAY) ×2 IMPLANT
SHEATH PEELAWAY SET 9 (SHEATH) IMPLANT
SPONGE LAP 4X18 RFD (DISPOSABLE) ×2 IMPLANT
STENT URET 6FRX26 CONTOUR (STENTS) ×1 IMPLANT
SUT ETHILON 3 0 PS 1 (SUTURE) ×2 IMPLANT
SYR 10ML LL (SYRINGE) ×2 IMPLANT
SYR 20ML LL LF (SYRINGE) ×4 IMPLANT
SYR 30ML LL (SYRINGE) ×1 IMPLANT
SYR 50ML LL SCALE MARK (SYRINGE) ×2 IMPLANT
TOWEL OR 17X26 10 PK STRL BLUE (TOWEL DISPOSABLE) ×2 IMPLANT
TUBING CONNECTING 10 (TUBING) ×4 IMPLANT
TUBING STONE CATCHER TRILOGY (MISCELLANEOUS) IMPLANT
TUBING UROLOGY SET (TUBING) ×2 IMPLANT

## 2019-04-24 NOTE — Anesthesia Postprocedure Evaluation (Signed)
Anesthesia Post Note  Patient: David Brandt  Procedure(s) Performed: NEPHROLITHOTOMY PERCUTANEOUS WITH SURGEON ACCESS (Left )     Patient location during evaluation: PACU Anesthesia Type: General Level of consciousness: awake Pain management: pain level controlled Vital Signs Assessment: post-procedure vital signs reviewed and stable Respiratory status: spontaneous breathing, nonlabored ventilation, respiratory function stable and patient connected to nasal cannula oxygen Cardiovascular status: blood pressure returned to baseline and stable Postop Assessment: no apparent nausea or vomiting Anesthetic complications: no    Last Vitals:  Vitals:   04/24/19 1500 04/24/19 1534  BP: (!) 164/94 (!) 178/94  Pulse: 70 69  Resp: 19 20  Temp: 36.4 C 36.6 C  SpO2: 95% 100%    Last Pain:  Vitals:   04/24/19 1539  TempSrc:   PainSc: 1                  Ryan P Ellender

## 2019-04-24 NOTE — Transfer of Care (Signed)
Immediate Anesthesia Transfer of Care Note  Patient: David Brandt  Procedure(s) Performed: NEPHROLITHOTOMY PERCUTANEOUS WITH SURGEON ACCESS (Left )  Patient Location: PACU  Anesthesia Type:General  Level of Consciousness: sedated  Airway & Oxygen Therapy: Patient Spontanous Breathing and Patient connected to face mask oxygen  Post-op Assessment: Report given to RN and Post -op Vital signs reviewed and stable  Post vital signs: Reviewed and stable  Last Vitals:  Vitals Value Taken Time  BP    Temp    Pulse    Resp    SpO2      Last Pain:  Vitals:   04/24/19 0613  TempSrc: Oral         Complications: No apparent anesthesia complications

## 2019-04-24 NOTE — Anesthesia Procedure Notes (Signed)
Procedure Name: Intubation Date/Time: 04/24/2019 7:36 AM Performed by: Talbot Grumbling, CRNA Pre-anesthesia Checklist: Patient identified, Emergency Drugs available, Suction available and Patient being monitored Patient Re-evaluated:Patient Re-evaluated prior to induction Oxygen Delivery Method: Circle system utilized Preoxygenation: Pre-oxygenation with 100% oxygen Induction Type: IV induction Ventilation: Mask ventilation without difficulty Laryngoscope Size: Mac and 4 Grade View: Grade I Tube type: Oral Tube size: 8.0 mm Number of attempts: 1 Airway Equipment and Method: Stylet Placement Confirmation: positive ETCO2,  breath sounds checked- equal and bilateral and ETT inserted through vocal cords under direct vision Secured at: 23 cm Tube secured with: Tape Dental Injury: Teeth and Oropharynx as per pre-operative assessment

## 2019-04-24 NOTE — H&P (View-Only) (Signed)
Acute Kidney Stone  HPI: David Brandt is a 73 year-old male patient who was referred by Dr. Ishmael Holter. [name], MD who is here for further eval and management of kidney stones.  He was diagnosed with a kidney stone on 04/04/2019. The patient presented to [facility] ED with symptoms of a kidney stone.   His pain started about 04/04/2019. The pain is on the left side.   Abdomen/Pelvic CT: Apr 04, 2019. The patient underwent CT scan prior to today's appointment.   The patient relates initially having flank pain. He is currently having back pain. He denies having flank pain, groin pain, nausea, vomiting, fever, chills, and voiding symptoms. He has been taking oxycodone 5/325 mg. He has not caught a stone in his urine strainer since his symptoms began.   He has had ESWL for treatment of his stones in the past. This is not his first kidney stone. His first stone was approximately 03/26/1999. He has had 1 stones prior to getting this one.   Patient presented to ER for evaluation of abdominal pain which localized to the left lower quadrant. Patient then developed left flank and lower abdomen.   The CT scan shows extensive stone within the mid left ureter as well as the upper and mid pole calices of the left kidney. There is delayed uptake of the contrast into the patient's left kidney.   Since the patient was in the see ER he has not had any significant pain. He denies any fevers or chills. He denies any dysuria or hematuria.   The patient has an extensive cardiac history and had open heart surgery in 2017. Since that time he has been doing reasonably well. He can walk a flight of steps, but does get short of breath. He is morbidly obese. His diabetes appears to be fairly well controlled.     ALLERGIES: Allopurinol TABS sulfa valsartan    MEDICATIONS: Hydrochlorothiazide 12.5 mg tablet  Metformin Hcl 500 mg tablet  Metoprolol Succinate 50 mg tablet, extended release 24 hr  Metoprolol Tartrate 25 mg  tablet  Aspirin Ec 81 mg tablet, delayed release  Atorvastatin Calcium 40 mg tablet  Atorvastatin Calcium 80 mg tablet  Carvedilol 25 mg tablet  Felodipine Er 10 mg tablet, extended release 24 hr  Omega XL  Symbicort 160 mcg-4.5 mcg/actuation hfa aerosol with adapter  Vitamin D3     GU PSH: None     PSH Notes: Cardiac Catheterization: right/left heart cath and coronary angiography (2017), coronary artery bypass graft, lymph node biopsy (2017), TEE without cardioversion (2017)   NON-GU PSH: Coronary Artery Bypass Grafting, 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 Has smoked since 03/25/2016. Smokes 1 pack per day.   Tobacco Use Assessment Completed: Used Tobacco in last 30 days? Drinks 4 drinks per week. Types of alcohol consumed: Beer.  Drinks 1 caffeinated drink per day. Patient's occupation is/was Retired.    REVIEW OF SYSTEMS:    GU Review Male:  Patient denies frequent urination, hard to postpone urination, burning/ pain with urination, get up at night to urinate, leakage of urine, stream starts and stops, trouble starting your stream, have to strain to urinate , erection problems, and penile pain.  Gastrointestinal (Upper):   Patient denies nausea, vomiting, and indigestion/ heartburn.  Gastrointestinal (Lower):   Patient denies diarrhea and constipation.  Constitutional:   Patient denies fever, night sweats, fatigue, and weight loss.  Skin:   Patient  denies skin rash/ lesion and itching.  Eyes:   Patient denies blurred vision and double vision.  Ears/ Nose/ Throat:   Patient denies sore throat and sinus problems.  Hematologic/Lymphatic:   Patient denies swollen glands and easy bruising.  Cardiovascular:   Patient denies leg swelling and chest pains.  Respiratory:   Patient reports shortness of breath. Patient denies cough.  Endocrine:   Patient denies excessive thirst.  Musculoskeletal:   Patient reports back pain. Patient denies joint pain.  Neurological:   Patient denies headaches and dizziness.  Psychologic:   Patient denies depression and anxiety.   VITAL SIGNS:      04/06/2019 01:30 PM  Weight 325 lb / 147.42 kg  Height 69 in / 175.26 cm  BP 144/87 mmHg  Pulse 76 /min  Temperature 97.7 F / 36.5 C  BMI 48.0 kg/m   MULTI-SYSTEM PHYSICAL EXAMINATION:    Constitutional: Obese. Poor hygiene. No physical deformities. Normally developed.   Neck: Neck symmetrical, not swollen. Normal tracheal position.  Respiratory: Normal breath sounds. No labored breathing, no use of accessory muscles.   Cardiovascular: Regular rate and rhythm. No murmur, no gallop. Sternotomy scar. Normal temperature, normal extremity pulses, no swelling, no varicosities.   Lymphatic: No enlargement of neck, axillae, groin.  Skin: No paleness, no jaundice, no cyanosis. No lesion, no ulcer, no rash.  Neurologic / Psychiatric: Oriented to time, oriented to place, oriented to person. No depression, no anxiety, no agitation.  Gastrointestinal: No mass, no tenderness, no rigidity, non obese abdomen.  Eyes: Normal conjunctivae. Normal eyelids.  Ears, Nose, Mouth, and Throat: Left ear no scars, no lesions, no masses. Right ear no scars, no lesions, no masses. Nose no scars, no lesions, no masses. Normal hearing. Normal lips.  Musculoskeletal: Normal gait and station of head and neck.     PAST DATA REVIEWED:  Source Of History:  Patient  Records Review:   Previous  Doctor Records, Previous Patient Records, POC Tool  X-Ray Review: C.T. Abdomen/Pelvis: Reviewed Films. Discussed With Patient.     PROCEDURES:          Urinalysis w/Scope Dipstick Dipstick Cont'd Micro  Color: Amber Bilirubin: Neg mg/dL WBC/hpf: 20 - 40/hpf  Appearance: Slightly Cloudy Ketones: Neg mg/dL RBC/hpf: 20 - 40/hpf  Specific Gravity: 1.020 Blood: 2+ ery/uL Bacteria: Few (10-25/hpf)  pH: 6.0 Protein: 1+ mg/dL Cystals: NS (Not Seen)  Glucose: Neg mg/dL Urobilinogen: 0.2 mg/dL Casts: NS (Not Seen)    Nitrites: Neg Trichomonas: Not Present    Leukocyte Esterase: 2+ leu/uL Mucous: Not Present      Epithelial Cells: 0 - 5/hpf      Yeast: NS (Not Seen)      Sperm: Not Present    ASSESSMENT:      ICD-10 Details  1 GU:   Renal and ureteral calculus - N20.2    PLAN:           Orders Labs Urine Culture          Document Letter(s):  Created for  Patient: Clinical Summary         Notes:   As it relates to the surgery itself I discussed the fact that the patient would be prone, and explained the renal access component. We then discussed the stone removal process as well as a postprocedure stent placement. I discussed the risks of this operation which predominantly include bleeding and damage to the surrounding structures. I also described for him the possibility of difficulty obtaining access which may require an additional surgery. After going through surgery, potential complications, and expected outcome, the patient has agreed to proceed with the operation.   Fortunately, his pain at this time seems to be reasonably well controlled. I am happy refill his pain medication as needed. He will contact us if that becomes necessary.   The patient does need cardiac clearance.

## 2019-04-24 NOTE — Discharge Instructions (Signed)
Discharge instructions following PCNL  Call your doctor for: Fevers greater than 100.5 Severe nausea or vomiting Increasing pain not controlled by pain medication Increasing redness or drainage from incisions Decreased urine output or a catheter is no longer draining  The number for questions is [phone].  Activity: Gradually increase activity with short frequent walks, 3-4 times a day.  Avoid strenuous activities, like sports, lawn-mowing, or heavy lifting (more than 10-15 pounds).  Wear loose, comfortable clothing that pull or kink the tube or tubes.  Do not drive while taking pain medication, or until your doctor permitts it.  Bathing and dressing changes: You should not shower for 48 hours after surgery.  Do not soak your back in a bathtub.  Diet: It is extremely important to drink plenty of fluids after surgery, especially water.  You may resume your regular diet, unless otherwise instructed.  Medications: May take Tylenol (acetaminophen) or ibuprofen (Advil, Motrin) as directed over-the-counter. Take any prescriptions as directed.  Follow-up appointments: Follow-up appointment will be scheduled with Dr. Nikkie Liming in 10-14 days for hospital check and stent removal.  

## 2019-04-24 NOTE — Op Note (Signed)
Preoperative diagnosis:  1. Left obstructing ureteral stone 2. Partial left staghorn calculi  Postoperative diagnosis:  1. same   Procedure: 1. Cystoscopy 2. Left ureteroscopy 3. Left retrograde pyelogram 4. Left percutaneous renal access 5. Left nephrolithotomy 6. Left antegrade ureteral stent placement 7. Left completion nephrostogram  Surgeon: [name], MD  Anesthesia: General  Complications: None  Intraoperative findings: 1.:  I was unable to advance a wire retrograde from the patient's bladder into his kidney because of severe high-grade obstruction from what I came to learn was a Steinstrasse. 2.:  Given that I was unable to perform a retrograde pyelogram to help obtain access I used a coaxial needle and targeted the partial staghorn.  Once I was in the patient's collecting system I then was able to inject contrast into the collecting system to help delineate it for access. 3.:  There was a lot of stones in the ureter which ultimately I was able to clear and bypass. 4: There is also likely residual stone in the mid calyx which I was unable to remove with the access that I was able to obtain.  As such, this patient is going to need a second look procedure. #5: The left retrograde pyelogram which was performed at the beginning of the case demonstrated a large tortuous ureter at the midportion with a Steinstrasse.  There was proximal hydronephrosis. 6: The completion nephrostogram at the end of the case demonstrated a filling defect in the midportion of the left calyx likely residual stone.  There is also extravasation in the upper pole noting collecting system perforation.  EBL: 200 mL  Specimens: Left kidney and ureteral stones which will be taken to [facility] for stone composition analysis.  Indication: David Brandt is a 73 y.o. patient with left-sided obstructing stones.  This was evaluated in the emergency room with a CT scan.  In discussion for  treatment options we opted to proceed with left PCNL.  After reviewing the management options for treatment, he elected to proceed with the above surgical procedure(s). We have discussed the potential benefits and risks of the procedure, side effects of the proposed treatment, the likelihood of the patient achieving the goals of the procedure, and any potential problems that might occur during the procedure or recuperation. Informed consent has been obtained.  Description of procedure:  The patient was taken to the operating room and general anesthesia was induced.  The patient was then flipped prone on the split leg bed, prepped and draped in the usual sterile fashion, and preoperative antibiotics were administered. A preoperative time-out was performed.   I began by using a flexible cystoscope and passed it to the patient's urethra and into his bladder under visual guidance.  I then cannulated the patient's left ureteral orifice and advanced it up to the mid aspect of the ureter.  I was unable to get it beyond this area initially.  I backed the scope out over the wire and attempted to advance the open-ended catheter over the wire which I was unsuccessful at doing.  I then exchanged the sensor wire for a Glidewire and again tried to advance this through the open-ended catheter unsuccessfully.  At this point I did injected 10 cc of Omnipaque contrast into the patient's left ureter performing a retrograde pyelogram with the above findings.  At this point I backed out the open-ended catheter and attempted to perform ureteroscopy.  I used the single lumen flexible ureteroscope and advanced it over the wire and into  the patient's left ureter which I was only able to get into the midportion at the level of the obstruction.  I tried to advance a wire through the ureteroscope unsuccessfully.  At this point I opted to forego the retrograde access and turned my attention to the patient's kidney and the left flank.  At  this point, I placed a 16 French Foley catheter.  Using a C arm at 25 degrees I targeted the patient's stone over the lower pole.  Using a bull's-eye technique I was able to get into the patient's collecting system.  I remove the inner portion of the coaxial needle and clear urine was obtained.  Once the kidney had been emptied I then injected 20 cc of Omnipaque contrast which delineated the collecting system.  At this point I attempted to target the lower portion of the stone burden in the lower pole but was unsure of my access, and as such I opted not to dilate it.  Instead, I again accessed the midpole calyx which was long and narrow and not ideal, but at least at this point I was sure that I was in the proper location and was able to get in at the tip of the calyx.  This was done using a coaxial needle in the bull's-eye technique at 27 degrees.  Once in the collecting system the inner portion of the needle was removed and was able to advance a wire into the distal ureter.  Again I was unable to get beyond the obstruction.  I then opted at this point to dilate a tract over this sensor wire.  I did this using a 30 French nephrostomy balloon dilator under fluoroscopic guidance.  Once the access was obtained I positioned the trocar over the balloon into the tip of the calyx.  I then deflated the balloon and remove the dilator giving me access into the kidney in the midportion.  At this point I was able to orient myself to the kidney and noted that there was a partial staghorn emanating from the lower portion of the kidney in the lower pole calyces.  I used the cystoscope to navigate down into the patient's mid ureter where I encountered a significant amount of ureteral stone.  Using a 0 tip basket I was able to remove the majority of the ureteral stone.  Once the majority was removed I was able to get a wire across the obstructed area and into the bladder.  I then remove the cystoscope and placed nephroscope  into the renal pelvis and was able to break up and remove a lot of the stuff that I pulled up from the ureter as well as some of the staghorn stone that was emanating into the renal pelvis.  Once the majority of the stone had been removed with the nephroscope and the trilogy I then began using the cystoscope to access the lower pole stones in a more comprehensive way.  I was able to remove the majority of the stones in the lower pole, but there was a big stag horn stone that was stuck within the intrapolar calyx that I was unable to completely remove because of the angle.  At this point, had been working on this patient for over 4 hours.  Given his habitus and the amount of anesthesia we opted to stage the procedure.  I then advanced a stent over the wire that I was able to get across his ureter through the nephroscope.  I passed a 26  cm 6 French double-J ureteral stent under fluoroscopic guidance with the distal end curling nicely in the bladder and the proximal end curling nicely in the renal pelvis.  I then reinspected the renal pelvis and removed any remaining stones it was easily accessible.  I then advanced a 24 [place] Ainsworth catheter over the last remaining wire and into the renal pelvis.  I performed a nephrostogram at this point with 20 cc of contrast which demonstrated a filling defect in the interpolar calyx consistent with the partial static that I seen and was unable to remove.  There is also some extravasation from the access point.  I then backed out the trocar over the nephrostomy tube catheter and then monitored for severe bleeding at this point.  Given that there was no bleeding coming from the neph tube or around that I opted to remove the nephrostomy tube.  I held direct pressure to this area until hemostasis was noted to be quite good.  I then instilled quarter percent Marcaine with epinephrine into the nephrostomy tube tract.  I closed the incision with 2-0 nylon in vertical mattress.   Dermabond was placed over the stab incisions that I had made earlier for access.  4 x 4 gauze and a Tegaderm was placed over the area.  The patient was subsequently flipped back to the supine position and extubated.  He was returned to the PACU in stable condition.  [name], M.D.

## 2019-04-24 NOTE — H&P (Signed)
Acute Kidney Stone  HPI: David Brandt is a 73 year-old male patient who was referred by Dr. Ishmael Holter. [name], MD who is here for further eval and management of kidney stones.  He was diagnosed with a kidney stone on 04/04/2019. The patient presented to [facility] ED with symptoms of a kidney stone.   His pain started about 04/04/2019. The pain is on the left side.   Abdomen/Pelvic CT: Apr 04, 2019. The patient underwent CT scan prior to today's appointment.   The patient relates initially having flank pain. He is currently having back pain. He denies having flank pain, groin pain, nausea, vomiting, fever, chills, and voiding symptoms. He has been taking oxycodone 5/325 mg. He has not caught a stone in his urine strainer since his symptoms began.   He has had ESWL for treatment of his stones in the past. This is not his first kidney stone. His first stone was approximately 03/26/1999. He has had 1 stones prior to getting this one.   Patient presented to ER for evaluation of abdominal pain which localized to the left lower quadrant. Patient then developed left flank and lower abdomen.   The CT scan shows extensive stone within the mid left ureter as well as the upper and mid pole calices of the left kidney. There is delayed uptake of the contrast into the patient's left kidney.   Since the patient was in the see ER he has not had any significant pain. He denies any fevers or chills. He denies any dysuria or hematuria.   The patient has an extensive cardiac history and had open heart surgery in 2017. Since that time he has been doing reasonably well. He can walk a flight of steps, but does get short of breath. He is morbidly obese. His diabetes appears to be fairly well controlled.     ALLERGIES: Allopurinol TABS sulfa valsartan    MEDICATIONS: Hydrochlorothiazide 12.5 mg tablet  Metformin Hcl 500 mg tablet  Metoprolol Succinate 50 mg tablet, extended release 24 hr  Metoprolol Tartrate 25 mg  tablet  Aspirin Ec 81 mg tablet, delayed release  Atorvastatin Calcium 40 mg tablet  Atorvastatin Calcium 80 mg tablet  Carvedilol 25 mg tablet  Felodipine Er 10 mg tablet, extended release 24 hr  Omega XL  Symbicort 160 mcg-4.5 mcg/actuation hfa aerosol with adapter  Vitamin D3     GU PSH: None     PSH Notes: Cardiac Catheterization: right/left heart cath and coronary angiography (2017), coronary artery bypass graft, lymph node biopsy (2017), TEE without cardioversion (2017)   NON-GU PSH: Coronary Artery Bypass Grafting, 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 Has smoked since 03/25/2016. Smokes 1 pack per day.   Tobacco Use Assessment Completed: Used Tobacco in last 30 days? Drinks 4 drinks per week. Types of alcohol consumed: Beer.  Drinks 1 caffeinated drink per day. Patient's occupation is/was Retired.    REVIEW OF SYSTEMS:    GU Review Male:  Patient denies frequent urination, hard to postpone urination, burning/ pain with urination, get up at night to urinate, leakage of urine, stream starts and stops, trouble starting your stream, have to strain to urinate , erection problems, and penile pain.  Gastrointestinal (Upper):   Patient denies nausea, vomiting, and indigestion/ heartburn.  Gastrointestinal (Lower):   Patient denies diarrhea and constipation.  Constitutional:   Patient denies fever, night sweats, fatigue, and weight loss.  Skin:   Patient  denies skin rash/ lesion and itching.  Eyes:   Patient denies blurred vision and double vision.  Ears/ Nose/ Throat:   Patient denies sore throat and sinus problems.  Hematologic/Lymphatic:   Patient denies swollen glands and easy bruising.  Cardiovascular:   Patient denies leg swelling and chest pains.  Respiratory:   Patient reports shortness of breath. Patient denies cough.  Endocrine:   Patient denies excessive thirst.  Musculoskeletal:   Patient reports back pain. Patient denies joint pain.  Neurological:   Patient denies headaches and dizziness.  Psychologic:   Patient denies depression and anxiety.   VITAL SIGNS:      04/06/2019 01:30 PM  Weight 325 lb / 147.42 kg  Height 69 in / 175.26 cm  BP 144/87 mmHg  Pulse 76 /min  Temperature 97.7 F / 36.5 C  BMI 48.0 kg/m   MULTI-SYSTEM PHYSICAL EXAMINATION:    Constitutional: Obese. Poor hygiene. No physical deformities. Normally developed.   Neck: Neck symmetrical, not swollen. Normal tracheal position.  Respiratory: Normal breath sounds. No labored breathing, no use of accessory muscles.   Cardiovascular: Regular rate and rhythm. No murmur, no gallop. Sternotomy scar. Normal temperature, normal extremity pulses, no swelling, no varicosities.   Lymphatic: No enlargement of neck, axillae, groin.  Skin: No paleness, no jaundice, no cyanosis. No lesion, no ulcer, no rash.  Neurologic / Psychiatric: Oriented to time, oriented to place, oriented to person. No depression, no anxiety, no agitation.  Gastrointestinal: No mass, no tenderness, no rigidity, non obese abdomen.  Eyes: Normal conjunctivae. Normal eyelids.  Ears, Nose, Mouth, and Throat: Left ear no scars, no lesions, no masses. Right ear no scars, no lesions, no masses. Nose no scars, no lesions, no masses. Normal hearing. Normal lips.  Musculoskeletal: Normal gait and station of head and neck.     PAST DATA REVIEWED:  Source Of History:  Patient  Records Review:   Previous  Doctor Records, Previous Patient Records, POC Tool  X-Ray Review: C.T. Abdomen/Pelvis: Reviewed Films. Discussed With Patient.     PROCEDURES:          Urinalysis w/Scope Dipstick Dipstick Cont'd Micro  Color: Amber Bilirubin: Neg mg/dL WBC/hpf: 20 - 40/hpf  Appearance: Slightly Cloudy Ketones: Neg mg/dL RBC/hpf: 20 - 40/hpf  Specific Gravity: 1.020 Blood: 2+ ery/uL Bacteria: Few (10-25/hpf)  pH: 6.0 Protein: 1+ mg/dL Cystals: NS (Not Seen)  Glucose: Neg mg/dL Urobilinogen: 0.2 mg/dL Casts: NS (Not Seen)    Nitrites: Neg Trichomonas: Not Present    Leukocyte Esterase: 2+ leu/uL Mucous: Not Present      Epithelial Cells: 0 - 5/hpf      Yeast: NS (Not Seen)      Sperm: Not Present    ASSESSMENT:      ICD-10 Details  1 GU:   Renal and ureteral calculus - N20.2    PLAN:           Orders Labs Urine Culture          Document Letter(s):  Created for  Patient: Clinical Summary         Notes:   As it relates to the surgery itself I discussed the fact that the patient would be prone, and explained the renal access component. We then discussed the stone removal process as well as a postprocedure stent placement. I discussed the risks of this operation which predominantly include bleeding and damage to the surrounding structures. I also described for him the possibility of difficulty obtaining access which may require an additional surgery. After going through surgery, potential complications, and expected outcome, the patient has agreed to proceed with the operation.   Fortunately, his pain at this time seems to be reasonably well controlled. I am happy refill his pain medication as needed. He will contact us if that becomes necessary.   The patient does need cardiac clearance.

## 2019-04-24 NOTE — OR Nursing (Signed)
Stone taken by Dr. Herrick 

## 2019-04-24 NOTE — Interval H&P Note (Signed)
History and Physical Interval Note:  04/24/2019 7:27 AM  David Brandt  has presented today for surgery, with the diagnosis of LEFT KIDNEY AND URETERAL STONE.  The various methods of treatment have been discussed with the patient and family. After consideration of risks, benefits and other options for treatment, the patient has consented to  Procedure(s): NEPHROLITHOTOMY PERCUTANEOUS WITH SURGEON ACCESS (Left) as a surgical intervention.  [name] history has been reviewed, patient examined, no change in status, stable for surgery.  I have reviewed the patient's chart and labs.  Questions were answered to the patient's satisfaction.     [name]

## 2019-04-25 ENCOUNTER — Encounter (HOSPITAL_COMMUNITY): Payer: Self-pay | Admitting: Urology

## 2019-04-25 DIAGNOSIS — Z6841 Body Mass Index (BMI) 40.0 and over, adult: Secondary | ICD-10-CM | POA: Diagnosis not present

## 2019-04-25 DIAGNOSIS — E7800 Pure hypercholesterolemia, unspecified: Secondary | ICD-10-CM | POA: Diagnosis not present

## 2019-04-25 DIAGNOSIS — E119 Type 2 diabetes mellitus without complications: Secondary | ICD-10-CM | POA: Diagnosis not present

## 2019-04-25 DIAGNOSIS — Z7951 Long term (current) use of inhaled steroids: Secondary | ICD-10-CM | POA: Diagnosis not present

## 2019-04-25 DIAGNOSIS — J449 Chronic obstructive pulmonary disease, unspecified: Secondary | ICD-10-CM | POA: Diagnosis not present

## 2019-04-25 DIAGNOSIS — I10 Essential (primary) hypertension: Secondary | ICD-10-CM | POA: Diagnosis not present

## 2019-04-25 DIAGNOSIS — Z87442 Personal history of urinary calculi: Secondary | ICD-10-CM | POA: Diagnosis not present

## 2019-04-25 DIAGNOSIS — N132 Hydronephrosis with renal and ureteral calculous obstruction: Secondary | ICD-10-CM | POA: Diagnosis not present

## 2019-04-25 LAB — CBC
HCT: 50.1 % (ref 39.0–52.0)
Hemoglobin: 15.9 g/dL (ref 13.0–17.0)
MCH: 29.7 pg (ref 26.0–34.0)
MCHC: 31.7 g/dL (ref 30.0–36.0)
MCV: 93.6 fL (ref 80.0–100.0)
Platelets: 238 10*3/uL (ref 150–400)
RBC: 5.35 MIL/uL (ref 4.22–5.81)
RDW: 13.5 % (ref 11.5–15.5)
WBC: 15.1 10*3/uL — ABNORMAL HIGH (ref 4.0–10.5)
nRBC: 0 % (ref 0.0–0.2)

## 2019-04-25 LAB — BASIC METABOLIC PANEL
Anion gap: 10 (ref 5–15)
BUN: 24 mg/dL — ABNORMAL HIGH (ref 8–23)
CO2: 24 mmol/L (ref 22–32)
Calcium: 8.6 mg/dL — ABNORMAL LOW (ref 8.9–10.3)
Chloride: 101 mmol/L (ref 98–111)
Creatinine, Ser: 1.77 mg/dL — ABNORMAL HIGH (ref 0.61–1.24)
GFR calc Af Amer: 44 mL/min — ABNORMAL LOW (ref >60)
GFR calc non Af Amer: 38 mL/min — ABNORMAL LOW (ref >60)
Glucose, Bld: 125 mg/dL — ABNORMAL HIGH (ref 70–99)
Potassium: 4.2 mmol/L (ref 3.5–5.1)
Sodium: 135 mmol/L (ref 135–145)

## 2019-04-25 NOTE — Plan of Care (Signed)
  Problem: Clinical Measurements: Goal: Postoperative complications will be avoided or minimized Outcome: Completed/Met   Problem: Skin Integrity: Goal: Demonstration of wound healing without infection will improve Outcome: Completed/Met   Problem: Education: Goal: Knowledge of General Education information will improve Description: Including pain rating scale, medication(s)/side effects and non-pharmacologic comfort measures Outcome: Completed/Met   Problem: Health Behavior/Discharge Planning: Goal: Ability to manage health-related needs will improve Outcome: Completed/Met

## 2019-04-25 NOTE — Discharge Summary (Signed)
Physician Discharge Summary  Patient ID: David Brandt MRN: [id number] DOB/AGE: 02-26-1946 73 y.o.  Admit date: 04/24/2019 Discharge date: 04/25/2019  Admission Diagnoses: Large L>R Renal Stones  Discharge Diagnoses:  Active Problems:   Urolithiasis   Nephrolithiasis   Discharged Condition: good  Hospital Course: Pt underwent LEFT percutaneous nephrostolithostomy on 04/24/19 without acute complications. JJ ureteral stent left in place. FU CT with dramatic reduction in stone volume. By the AM of POD 1 he is tollerating PO intake, Hgb 15.9, Cr 1.7, foley removed, and felt to be adequate for discharge.   Consults: None  Significant Diagnostic Studies: labs: as per above, CT as per above.   Treatments: surgery: as per above.   Discharge Exam: Blood pressure (!) 164/79, pulse 74, temperature 98.6 F (37 C), temperature source Oral, resp. rate 18, height 5\' 9"  (1.753 m), weight (!) 142.4 kg, SpO2 95 %. General appearance: alert and cooperative Eyes: negative Nose: Nares normal. Septum midline. Mucosa normal. No drainage or sinus tenderness. Throat: lips, mucosa, and tongue normal; teeth and gums normal Neck: supple, symmetrical, trachea midline Back: symmetric, no curvature. ROM normal. No CVA tenderness., left PCNL site wtih dry dressing. No hematomas. c/d/i.  Resp: Non-labored on room air.  Cardio: Nl rate GI: soft, non-tender; bowel sounds normal; no masses,  no organomegaly and large truncal obesity.  Male genitalia: normal, foley with thin red urine, no clots in catheter. 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 Foely removed.  Extremities: extremities normal, atraumatic, no cyanosis or edema Skin: Skin color, texture, turgor normal. No rashes or lesions Lymph nodes: Cervical, supraclavicular, and axillary nodes normal. Neurologic: Grossly normal Incision/Wound: as per above  Disposition: HOME    Follow-up Information    [name], MD In 2 weeks.    Specialty: Urology Why: second stage ureteroscopy - to remove all remaining stones on left side of urinary tract. Contact information: [place] [address] [phone]           Signed: Alexis Frock 04/25/2019, 9:26 AM

## 2019-04-28 ENCOUNTER — Ambulatory Visit (INDEPENDENT_AMBULATORY_CARE_PROVIDER_SITE_OTHER): Payer: Medicare PPO | Admitting: Family Medicine

## 2019-04-28 ENCOUNTER — Other Ambulatory Visit: Payer: Self-pay | Admitting: Urology

## 2019-04-28 ENCOUNTER — Other Ambulatory Visit: Payer: Self-pay | Admitting: Family Medicine

## 2019-04-28 ENCOUNTER — Other Ambulatory Visit: Payer: Self-pay

## 2019-04-28 ENCOUNTER — Encounter: Payer: Self-pay | Admitting: Family Medicine

## 2019-04-28 VITALS — BP 150/90 | HR 79 | Temp 98.0°F | Ht 68.0 in | Wt 310.4 lb

## 2019-04-28 DIAGNOSIS — Z23 Encounter for immunization: Secondary | ICD-10-CM

## 2019-04-28 DIAGNOSIS — Z0000 Encounter for general adult medical examination without abnormal findings: Secondary | ICD-10-CM

## 2019-04-28 DIAGNOSIS — E291 Testicular hypofunction: Secondary | ICD-10-CM

## 2019-04-28 NOTE — Progress Notes (Signed)
Subjective:    Patient ID: David Brandt, male    DOB: Sep 28, 1945, 73 y.o.   MRN: [id number]  HPI Here for a well exam. He feels fine except for kidney stones. He sees Dr. Louis Meckel for bilateral kidney stones. On 04-24-19 he had percutaneous nephrolithotomies, but not all the stones could be retrieved. Now he is scheduled for ureteroscopies on 05-27-19 to removed the remaining stones. He had extensive labwork per Dr. Oval Linsey recently and these were fairly unremarkable. His A1c in August was 6.7.    Review of Systems  Constitutional: Negative.   HENT: Negative.   Eyes: Negative.   Respiratory: Negative.   Cardiovascular: Negative.   Gastrointestinal: Negative.   Genitourinary: Negative.   Musculoskeletal: Negative.   Skin: Negative.   Neurological: Negative.   Psychiatric/Behavioral: Negative.        Objective:   Physical Exam Constitutional:      General: He is not in acute distress.    Appearance: He is well-developed. He is not diaphoretic.  HENT:     Head: Normocephalic and atraumatic.     Right Ear: External ear normal.     Left Ear: External ear normal.     Nose: Nose normal.     Mouth/Throat:     Pharynx: No oropharyngeal exudate.  Eyes:     General: No scleral icterus.       Right eye: No discharge.        Left eye: No discharge.     Conjunctiva/sclera: Conjunctivae normal.     Pupils: Pupils are equal, round, and reactive to light.  Neck:     Musculoskeletal: Neck supple.     Thyroid: No thyromegaly.     Vascular: No JVD.     Trachea: No tracheal deviation.  Cardiovascular:     Rate and Rhythm: Normal rate and regular rhythm.     Heart sounds: Normal heart sounds. No murmur. No friction rub. No gallop.   Pulmonary:     Effort: Pulmonary effort is normal. No respiratory distress.     Breath sounds: Normal breath sounds. No wheezing or rales.  Chest:     Chest wall: No tenderness.  Abdominal:     General: Bowel sounds are normal. There is no  distension.     Palpations: Abdomen is soft. There is no mass.     Tenderness: There is no abdominal tenderness. There is no guarding or rebound.  Genitourinary:    Penis: Normal. No tenderness.      Scrotum/Testes: Normal.     Prostate: Normal.     Rectum: Normal. Guaiac result negative.  Musculoskeletal: Normal range of motion.        General: No tenderness.  Lymphadenopathy:     Cervical: No cervical adenopathy.  Skin:    General: Skin is warm and dry.     Coloration: Skin is not pale.     Findings: No erythema or rash.  Neurological:     Mental Status: He is alert and oriented to person, place, and time.     Cranial Nerves: No cranial nerve deficit.     Motor: No abnormal muscle tone.     Coordination: Coordination normal.     Deep Tendon Reflexes: Reflexes are normal and symmetric. Reflexes normal.  Psychiatric:        Behavior: Behavior normal.        Thought Content: Thought content normal.        Judgment: Judgment normal.  Assessment & Plan:  Well exam. We discussed diet and exercise. Check a PSA.  Alysia Penna, MD

## 2019-04-29 LAB — PSA: PSA: 3.03 ng/mL (ref 0.10–4.00)

## 2019-04-29 MED ORDER — TESTOSTERONE CYPIONATE 200 MG/ML IM SOLN: INTRAMUSCULAR | 5 refills | Status: DC

## 2019-04-29 NOTE — Telephone Encounter (Signed)
Done

## 2019-04-29 NOTE — Addendum Note (Signed)
Addended by: Wynn Banker H on: 04/29/2019 10:41 AM   Modules accepted: Orders

## 2019-05-04 ENCOUNTER — Other Ambulatory Visit (HOSPITAL_COMMUNITY)
Admission: RE | Admit: 2019-05-04 | Discharge: 2019-05-04 | Disposition: A | Discharge status: Home / Self Care | Payer: Medicare PPO | Source: Ambulatory Visit | Attending: Urology | Admitting: Urology

## 2019-05-04 DIAGNOSIS — Z20828 Contact with and (suspected) exposure to other viral communicable diseases: Secondary | ICD-10-CM | POA: Insufficient documentation

## 2019-05-04 DIAGNOSIS — N202 Calculus of kidney with calculus of ureter: Secondary | ICD-10-CM | POA: Diagnosis not present

## 2019-05-04 DIAGNOSIS — Z01812 Encounter for preprocedural laboratory examination: Secondary | ICD-10-CM | POA: Diagnosis not present

## 2019-05-05 LAB — NOVEL CORONAVIRUS, NAA (HOSP ORDER, SEND-OUT TO REF LAB; TAT 18-24 HRS): SARS-CoV-2, NAA: NOT DETECTED

## 2019-05-06 ENCOUNTER — Encounter (HOSPITAL_COMMUNITY): Payer: Self-pay | Admitting: *Deleted

## 2019-05-06 ENCOUNTER — Other Ambulatory Visit: Payer: Self-pay

## 2019-05-06 NOTE — Progress Notes (Addendum)
PCP - Dr. Delma Freeze Cardiologist - Dr. Skeet Latch  Chest x-ray - 04/04/2019 EKG - 04/06/2019 Stress Test - 03/27/2019 ECHO - 04/09/2019 Cardiac Cath - before CABG done 2017  Sleep Study - yes years ago nut unknown date CPAP - yes  Fasting Blood Sugar - 112-120 Checks Blood Sugar __rarely___ times a day  Blood Thinner Instructions:none Aspirin Instructions:Instructed to Stop Aspirin 5-7 days prior to surgery- prescribed by Dr. Skeet Latch as preventative Last Dose:11/07/20520  Anesthesia review:  Chart given to Audria Nine to review.  Patient denies shortness of breath, fever, cough and chest pain via phone call with patient .  Patient has a history of CAD, HTN, OSA and uses CPAP, COPD, DM, S/P CABG in 2017.  Patient verbalized understanding of instructions that were given to them via phone call today 05/06/2019. Patient was also instructed that they will need to review over the  instructions again that I just instructed them via phone for surgery tomorrow 05/06/2019.

## 2019-05-07 ENCOUNTER — Ambulatory Visit (HOSPITAL_COMMUNITY)
Admission: RE | Admit: 2019-05-07 | Discharge: 2019-05-07 | Disposition: A | Discharge status: Home / Self Care | Payer: Medicare PPO | Attending: Urology | Admitting: Urology

## 2019-05-07 ENCOUNTER — Ambulatory Visit (HOSPITAL_COMMUNITY): Payer: Medicare PPO

## 2019-05-07 ENCOUNTER — Encounter (HOSPITAL_COMMUNITY)
Admission: RE | Disposition: A | Discharge status: Home / Self Care | Payer: Self-pay | Source: Home / Self Care | Attending: Urology

## 2019-05-07 ENCOUNTER — Ambulatory Visit (HOSPITAL_COMMUNITY): Payer: Medicare PPO | Admitting: Anesthesiology

## 2019-05-07 ENCOUNTER — Other Ambulatory Visit: Payer: Self-pay

## 2019-05-07 ENCOUNTER — Ambulatory Visit: Payer: Medicare PPO

## 2019-05-07 DIAGNOSIS — Z7951 Long term (current) use of inhaled steroids: Secondary | ICD-10-CM | POA: Insufficient documentation

## 2019-05-07 DIAGNOSIS — N132 Hydronephrosis with renal and ureteral calculous obstruction: Secondary | ICD-10-CM | POA: Insufficient documentation

## 2019-05-07 DIAGNOSIS — Z951 Presence of aortocoronary bypass graft: Secondary | ICD-10-CM | POA: Diagnosis not present

## 2019-05-07 DIAGNOSIS — J449 Chronic obstructive pulmonary disease, unspecified: Secondary | ICD-10-CM | POA: Insufficient documentation

## 2019-05-07 DIAGNOSIS — G4730 Sleep apnea, unspecified: Secondary | ICD-10-CM | POA: Insufficient documentation

## 2019-05-07 DIAGNOSIS — Z6841 Body Mass Index (BMI) 40.0 and over, adult: Secondary | ICD-10-CM | POA: Diagnosis not present

## 2019-05-07 DIAGNOSIS — E7800 Pure hypercholesterolemia, unspecified: Secondary | ICD-10-CM | POA: Diagnosis not present

## 2019-05-07 DIAGNOSIS — N2889 Other specified disorders of kidney and ureter: Secondary | ICD-10-CM | POA: Insufficient documentation

## 2019-05-07 DIAGNOSIS — N202 Calculus of kidney with calculus of ureter: Secondary | ICD-10-CM | POA: Diagnosis not present

## 2019-05-07 DIAGNOSIS — Z79899 Other long term (current) drug therapy: Secondary | ICD-10-CM | POA: Diagnosis not present

## 2019-05-07 DIAGNOSIS — Z7984 Long term (current) use of oral hypoglycemic drugs: Secondary | ICD-10-CM | POA: Diagnosis not present

## 2019-05-07 DIAGNOSIS — Z87442 Personal history of urinary calculi: Secondary | ICD-10-CM | POA: Insufficient documentation

## 2019-05-07 DIAGNOSIS — E119 Type 2 diabetes mellitus without complications: Secondary | ICD-10-CM | POA: Diagnosis not present

## 2019-05-07 DIAGNOSIS — E785 Hyperlipidemia, unspecified: Secondary | ICD-10-CM | POA: Diagnosis not present

## 2019-05-07 DIAGNOSIS — Z7982 Long term (current) use of aspirin: Secondary | ICD-10-CM | POA: Diagnosis not present

## 2019-05-07 DIAGNOSIS — I10 Essential (primary) hypertension: Secondary | ICD-10-CM | POA: Diagnosis not present

## 2019-05-07 DIAGNOSIS — N200 Calculus of kidney: Secondary | ICD-10-CM | POA: Diagnosis not present

## 2019-05-07 DIAGNOSIS — D4122 Neoplasm of uncertain behavior of left ureter: Secondary | ICD-10-CM | POA: Diagnosis not present

## 2019-05-07 DIAGNOSIS — F17210 Nicotine dependence, cigarettes, uncomplicated: Secondary | ICD-10-CM | POA: Diagnosis not present

## 2019-05-07 HISTORY — PX: CYSTOSCOPY/URETEROSCOPY/HOLMIUM LASER/STENT PLACEMENT: SHX6546

## 2019-05-07 LAB — CBC
HCT: 52.2 % — ABNORMAL HIGH (ref 39.0–52.0)
Hemoglobin: 16.8 g/dL (ref 13.0–17.0)
MCH: 29.9 pg (ref 26.0–34.0)
MCHC: 32.2 g/dL (ref 30.0–36.0)
MCV: 93 fL (ref 80.0–100.0)
Platelets: 194 10*3/uL (ref 150–400)
RBC: 5.61 MIL/uL (ref 4.22–5.81)
RDW: 13.1 % (ref 11.5–15.5)
WBC: 10.9 10*3/uL — ABNORMAL HIGH (ref 4.0–10.5)
nRBC: 0 % (ref 0.0–0.2)

## 2019-05-07 LAB — BASIC METABOLIC PANEL
Anion gap: 9 (ref 5–15)
BUN: 23 mg/dL (ref 8–23)
CO2: 25 mmol/L (ref 22–32)
Calcium: 9.2 mg/dL (ref 8.9–10.3)
Chloride: 103 mmol/L (ref 98–111)
Creatinine, Ser: 1.39 mg/dL — ABNORMAL HIGH (ref 0.61–1.24)
GFR calc Af Amer: 59 mL/min — ABNORMAL LOW (ref >60)
GFR calc non Af Amer: 51 mL/min — ABNORMAL LOW (ref >60)
Glucose, Bld: 135 mg/dL — ABNORMAL HIGH (ref 70–99)
Potassium: 3.4 mmol/L — ABNORMAL LOW (ref 3.5–5.1)
Sodium: 137 mmol/L (ref 135–145)

## 2019-05-07 LAB — GLUCOSE, CAPILLARY
Glucose-Capillary: 123 mg/dL — ABNORMAL HIGH (ref 70–99)
Glucose-Capillary: 111 mg/dL — ABNORMAL HIGH (ref 70–99)

## 2019-05-07 SURGERY — CYSTOSCOPY/URETEROSCOPY/HOLMIUM LASER/STENT PLACEMENT
Anesthesia: General | Site: Ureter | Laterality: Bilateral

## 2019-05-07 MED ORDER — DEXAMETHASONE SODIUM PHOSPHATE 10 MG/ML IJ SOLN
INTRAMUSCULAR | Status: AC
  Filled 2019-05-07: qty 1

## 2019-05-07 MED ORDER — LIDOCAINE HCL URETHRAL/MUCOSAL 2 % EX GEL
CUTANEOUS | Status: AC
  Filled 2019-05-07: qty 30

## 2019-05-07 MED ORDER — SODIUM CHLORIDE 0.9 % IR SOLN
Status: DC | PRN
  Administered 2019-05-07: 3000 mL via INTRAVESICAL

## 2019-05-07 MED ORDER — LACTATED RINGERS IV SOLN
INTRAVENOUS | Status: DC
  Administered 2019-05-07: 10:00:00 via INTRAVENOUS

## 2019-05-07 MED ORDER — OXYCODONE HCL 5 MG/5ML PO SOLN: 5.0000 mg | Freq: Once | ORAL | Status: DC | PRN

## 2019-05-07 MED ORDER — DEXAMETHASONE SODIUM PHOSPHATE 10 MG/ML IJ SOLN
INTRAMUSCULAR | Status: DC | PRN
  Administered 2019-05-07: 10 mg via INTRAVENOUS

## 2019-05-07 MED ORDER — ACETAMINOPHEN 325 MG PO TABS: 325.0000 mg | ORAL_TABLET | ORAL | Status: DC | PRN

## 2019-05-07 MED ORDER — MEPERIDINE HCL 50 MG/ML IJ SOLN: 6.2500 mg | INTRAMUSCULAR | Status: DC | PRN

## 2019-05-07 MED ORDER — OXYCODONE HCL 5 MG PO TABS: 5.0000 mg | ORAL_TABLET | Freq: Once | ORAL | Status: DC | PRN

## 2019-05-07 MED ORDER — PHENYLEPHRINE 40 MCG/ML (10ML) SYRINGE FOR IV PUSH (FOR BLOOD PRESSURE SUPPORT)
PREFILLED_SYRINGE | INTRAVENOUS | Status: AC
  Filled 2019-05-07: qty 10

## 2019-05-07 MED ORDER — PROPOFOL 10 MG/ML IV BOLUS
INTRAVENOUS | Status: DC | PRN
  Administered 2019-05-07: 260 mg via INTRAVENOUS

## 2019-05-07 MED ORDER — TRAMADOL HCL 50 MG PO TABS: 50.0000 mg | ORAL_TABLET | Freq: Four times a day (QID) | ORAL | 0 refills | Status: DC | PRN

## 2019-05-07 MED ORDER — SUCCINYLCHOLINE CHLORIDE 200 MG/10ML IV SOSY
PREFILLED_SYRINGE | INTRAVENOUS | Status: DC | PRN
  Administered 2019-05-07: 140 mg via INTRAVENOUS

## 2019-05-07 MED ORDER — FENTANYL CITRATE (PF) 100 MCG/2ML IJ SOLN
INTRAMUSCULAR | Status: DC | PRN
  Administered 2019-05-07 (×4): 50 ug via INTRAVENOUS

## 2019-05-07 MED ORDER — ONDANSETRON HCL 4 MG/2ML IJ SOLN
INTRAMUSCULAR | Status: AC
  Filled 2019-05-07: qty 2

## 2019-05-07 MED ORDER — BELLADONNA ALKALOIDS-OPIUM 16.2-30 MG RE SUPP
RECTAL | Status: AC
  Filled 2019-05-07: qty 1

## 2019-05-07 MED ORDER — PROPOFOL 10 MG/ML IV BOLUS
INTRAVENOUS | Status: AC
  Filled 2019-05-07: qty 20

## 2019-05-07 MED ORDER — BELLADONNA ALKALOIDS-OPIUM 16.2-60 MG RE SUPP
RECTAL | Status: DC | PRN
  Administered 2019-05-07: 1 via RECTAL

## 2019-05-07 MED ORDER — ONDANSETRON HCL 4 MG/2ML IJ SOLN: 4.0000 mg | Freq: Once | INTRAMUSCULAR | Status: DC | PRN

## 2019-05-07 MED ORDER — CIPROFLOXACIN IN D5W 400 MG/200ML IV SOLN
INTRAVENOUS | Status: AC
  Filled 2019-05-07: qty 200

## 2019-05-07 MED ORDER — LIDOCAINE 2% (20 MG/ML) 5 ML SYRINGE
INTRAMUSCULAR | Status: DC | PRN
  Administered 2019-05-07: 75 mg via INTRAVENOUS
  Administered 2019-05-07: 25 mg via INTRAVENOUS

## 2019-05-07 MED ORDER — ONDANSETRON HCL 4 MG/2ML IJ SOLN
INTRAMUSCULAR | Status: DC | PRN
  Administered 2019-05-07: 4 mg via INTRAVENOUS

## 2019-05-07 MED ORDER — LACTATED RINGERS IV SOLN
INTRAVENOUS | Status: DC | PRN
  Administered 2019-05-07 (×2): via INTRAVENOUS

## 2019-05-07 MED ORDER — CIPROFLOXACIN IN D5W 400 MG/200ML IV SOLN
400.0000 mg | INTRAVENOUS | Status: AC
  Administered 2019-05-07: 400 mg via INTRAVENOUS

## 2019-05-07 MED ORDER — MIDAZOLAM HCL 2 MG/2ML IJ SOLN
INTRAMUSCULAR | Status: AC
  Filled 2019-05-07: qty 2

## 2019-05-07 MED ORDER — MIDAZOLAM HCL 5 MG/5ML IJ SOLN
INTRAMUSCULAR | Status: DC | PRN
  Administered 2019-05-07 (×2): 1 mg via INTRAVENOUS

## 2019-05-07 MED ORDER — IOHEXOL 300 MG/ML  SOLN
INTRAMUSCULAR | Status: DC | PRN
  Administered 2019-05-07: 40 mL

## 2019-05-07 MED ORDER — PHENYLEPHRINE 40 MCG/ML (10ML) SYRINGE FOR IV PUSH (FOR BLOOD PRESSURE SUPPORT)
PREFILLED_SYRINGE | INTRAVENOUS | Status: DC | PRN
  Administered 2019-05-07: 80 ug via INTRAVENOUS

## 2019-05-07 MED ORDER — ACETAMINOPHEN 160 MG/5ML PO SOLN: 325.0000 mg | ORAL | Status: DC | PRN

## 2019-05-07 MED ORDER — FENTANYL CITRATE (PF) 250 MCG/5ML IJ SOLN
INTRAMUSCULAR | Status: AC
  Filled 2019-05-07: qty 5

## 2019-05-07 MED ORDER — FENTANYL CITRATE (PF) 100 MCG/2ML IJ SOLN: 25.0000 ug | INTRAMUSCULAR | Status: DC | PRN

## 2019-05-07 SURGICAL SUPPLY — 21 items
BAG URO CATCHER STRL LF (MISCELLANEOUS) ×2 IMPLANT
BASKET ZERO TIP NITINOL 2.4FR (BASKET) ×1 IMPLANT
BSKT STON RTRVL ZERO TP 2.4FR (BASKET) ×1
CATH URET 5FR 28IN OPEN ENDED (CATHETERS) ×2 IMPLANT
CLOTH BEACON ORANGE TIMEOUT ST (SAFETY) ×2 IMPLANT
EXTRACTOR STONE 1.7FRX115CM (UROLOGICAL SUPPLIES) ×1 IMPLANT
FIBER LASER FLEXIVA 365 (UROLOGICAL SUPPLIES) ×1 IMPLANT
FIBER LASER TRAC TIP (UROLOGICAL SUPPLIES) ×1 IMPLANT
GLOVE BIOGEL M STRL SZ7.5 (GLOVE) ×2 IMPLANT
GOWN STRL REUS W/TWL XL LVL3 (GOWN DISPOSABLE) ×2 IMPLANT
GUIDEWIRE ANG ZIPWIRE 038X150 (WIRE) IMPLANT
GUIDEWIRE STR DUAL SENSOR (WIRE) ×3 IMPLANT
KIT TURNOVER KIT A (KITS) IMPLANT
MANIFOLD NEPTUNE II (INSTRUMENTS) ×2 IMPLANT
PACK CYSTO (CUSTOM PROCEDURE TRAY) ×2 IMPLANT
SHEATH URETERAL 12FRX28CM (UROLOGICAL SUPPLIES) ×1 IMPLANT
SHEATH URETERAL 12FRX35CM (MISCELLANEOUS) IMPLANT
STENT CONTOUR 6FRX26X.038 (STENTS) ×2 IMPLANT
TUBING CONNECTING 10 (TUBING) ×2 IMPLANT
TUBING UROLOGY SET (TUBING) ×2 IMPLANT
WIRE COONS/BENSON .038X145CM (WIRE) IMPLANT

## 2019-05-07 NOTE — Anesthesia Preprocedure Evaluation (Addendum)
Anesthesia Evaluation  Patient identified by MRN, date of birth, ID band Patient awake    Reviewed: Allergy & Precautions, H&P , NPO status , Patient's Chart, lab work & pertinent test results  Airway Mallampati: II   Neck ROM: full    Dental   Pulmonary asthma , sleep apnea , COPD, Current Smoker, former smoker,    breath sounds clear to auscultation       Cardiovascular hypertension, + angina  Rhythm:regular Rate:Normal     Neuro/Psych    GI/Hepatic   Endo/Other  diabetes, Type 2Morbid obesity  Renal/GU Renal InsufficiencyRenal disease     Musculoskeletal  (+) Arthritis ,   Abdominal   Peds  Hematology   Anesthesia Other Findings   Reproductive/Obstetrics                             Anesthesia Physical  Anesthesia Plan  ASA: III  Anesthesia Plan: General   Post-op Pain Management:    Induction: Intravenous  PONV Risk Score and Plan: 1 and Ondansetron, Dexamethasone, Midazolam and Treatment may vary due to age or medical condition  Airway Management Planned: Oral ETT  Additional Equipment:   Intra-op Plan:   Post-operative Plan: Extubation in OR  Informed Consent: I have reviewed the patients History and Physical, chart, labs and discussed the procedure including the risks, benefits and alternatives for the proposed anesthesia with the patient or authorized representative who has indicated his/her understanding and acceptance.       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 Cardiology clearance per telephone encounter 04/07/19 "Given past medical history and time since last visit, based on ACC/AHA guidelines, Lucus Fauteux would be at acceptable risk for the planned procedure without further cardiovascular testing. Recent echo and stress test were normal.. Ok to hold aspirin for 5-7  days prior to the procedure and restart as soon as possible afterward."  TTE 03/30/19:  1. Left ventricular ejection fraction, by visual estimation, is 65 to 70%. The left ventricle has normal function. Normal left ventricular size. There is no left ventricular hypertrophy.  2. Left ventricular diastolic Doppler parameters are consistent with pseudonormalization pattern of LV diastolic filling.  3. Global right ventricle has normal systolic function.The right ventricular size is normal. No increase in right ventricular wall thickness.    Nuclear stress 03/27/19:  Nuclear stress EF: 58%.  There was no ST segment deviation noted during stress.  The study is normal.  This is a low risk study.  The left ventricular ejection fraction is normal (55-65%).   Normal stress nuclear study with no ischemia or infarction.  Gated ejection fraction 58% with normal wall motion.)       Anesthesia Quick Evaluation

## 2019-05-07 NOTE — Discharge Instructions (Signed)
DISCHARGE INSTRUCTIONS FOR KIDNEY STONE/URETERAL STENT   MEDICATIONS:  1.  Resume all your other meds from home - except do not take any extra narcotic pain meds that you may have at home.  2. Pyridium is to help with the burning/stinging when you urinate. 3. Tramadol is for moderate/severe pain, otherwise taking upto 1000 mg every 6 hours of plainTylenol will help treat your pain.     ACTIVITY:  1. No strenuous activity x 1week  2. No driving while on narcotic pain medications  3. Drink plenty of water  4. Continue to walk at home - you can still get blood clots when you are at home, so keep active, but don't over do it.  5. May return to work/school tomorrow or when you feel ready   BATHING:  1. You can shower and we recommend daily showers   SIGNS/SYMPTOMS TO CALL:  Please call us if you have a fever greater than 101.5, uncontrolled nausea/vomiting, uncontrolled pain, dizziness, unable to urinate, bloody urine, chest pain, shortness of breath, leg swelling, leg pain, redness around wound, drainage from wound, or any other concerns or questions.   You can reach [name] at [phone].   FOLLOW-UP:  1. You have a another surgery scheduled on 05/27/19 for completion of the remainder of all your stones.

## 2019-05-07 NOTE — Anesthesia Procedure Notes (Signed)
Procedure Name: Intubation Date/Time: 05/07/2019 11:43 AM Performed by: Lissa Morales, CRNA Pre-anesthesia Checklist: Patient identified, Emergency Drugs available, Suction available and Patient being monitored Patient Re-evaluated:Patient Re-evaluated prior to induction Oxygen Delivery Method: Circle system utilized Preoxygenation: Pre-oxygenation with 100% oxygen Induction Type: IV induction, Cricoid Pressure applied and Rapid sequence Laryngoscope Size: Mac and 4 Grade View: Grade II Tube type: Oral Tube size: 8.0 mm Number of attempts: 1 Airway Equipment and Method: Stylet and Oral airway Placement Confirmation: ETT inserted through vocal cords under direct vision,  positive ETCO2 and breath sounds checked- equal and bilateral Secured at: 22 cm Tube secured with: Tape Dental Injury: Teeth and Oropharynx as per pre-operative assessment

## 2019-05-07 NOTE — Interval H&P Note (Signed)
History and Physical Interval Note: Patient alert remaining left-sided stone burden as well as some nonobstructing stones in the right kidney.  Today, our plan is to perform a second stage left-sided ureteroscopy and then began treatment of his stones on the right.  He will need additional surgery to clear all the remaining stones.  There have been no changes to his history and physical.   05/07/2019 11:09 AM  David Brandt  has presented today for surgery, with the diagnosis of BILATERAL URETERAL AND KIDNEY STONES.  The various methods of treatment have been discussed with the patient and family. After consideration of risks, benefits and other options for treatment, the patient has consented to  Procedure(s): BILATERAL URETEROSCOPY/HOLMIUM LASER/STENT PLACEMENT (Bilateral) as a surgical intervention.  [name] history has been reviewed, patient examined, no change in status, stable for surgery.  I have reviewed the patient's chart and labs.  Questions were answered to the patient's satisfaction.     [name]

## 2019-05-07 NOTE — Op Note (Signed)
Preoperative diagnosis:  1. Bilateral nephrolithiasis   Postoperative diagnosis:  1. Same 2. Left ureteral mass   Procedure: 1. Bilateral retrograde pyelograms with interpretation. 2. Left ureteroscopy/laser lithotripsy/stone extraction 3. Left ureteral mass biopsy 4. Left pyeloscopy/laser lithotripsy 5. Left ureteral stent exchange 6. Right ureteral stent placement. 7.  Surgeon: [name], MD  Anesthesia: General  Complications: None  Intraoperative findings:  1. Large left ureteral stone trapped in ureter by large obstructing solid mass - which was biopsied.  Ureteral stone was able to be treated with laser fragmentation. 2. Large lower pole stone in left kidney which was fragmented into numerous smaller pieces and the fragments dusted. 3. Left retrograde pyelogram demonstrated a large filling defect in the mid ureter with proximal hydronephrosis.  There was also a large filling defect in the left lower pole.  No other appreciable abnormalities 4.  The right retrograde pyelogram was normal with a normal caliber ureter and no filling defects or hydronephrosis. 5.  6Fx 26cm stents were left bilaterally in the ureters.  EBL: Minimal  Specimens: Left ureteral mass biopsy  Indication: David Brandt is a 73 y.o. patient with large obstructing ureteral stone as well as stones within the lower pole of the left kidney as well as the right kidney and has undergone a PCNL already on the left side presents for further management of his stones - a second look on the left side and a start to the right side.  After reviewing the management options for treatment, he elected to proceed with the above surgical procedure(s). We have discussed the potential benefits and risks of the procedure, side effects of the proposed treatment, the likelihood of the patient achieving the goals of the procedure, and any potential problems that might occur during the procedure or recuperation. Informed  consent has been obtained.  Description of procedure:  The patient was taken to the operating room and general anesthesia was induced.  The patient was placed in the dorsal lithotomy position, prepped and draped in the usual sterile fashion, and preoperative antibiotics were administered. A preoperative time-out was performed.   A 51F 30 degree cystoscope was placed into the patients urethra and into his bladder and a grasper used to pull the stent to the urethral meatus.  The stent was then wired and was removed over the wire.  I was unable to advance the wire beyond the mid ureter and even tried using the open ended unsuccessfully.  I then performed a retrograde pyelogram on the left with the above findings.  I was then ultimately able to the the wire beyond the mid-ureter but had extreme difficulty advancing it up in the renal pelvis.  I then advanced a semi-rigid ureteroscope up through the ureter and to the mid-portion of the ureter.  Noting an obstructing mass in this area.  I was able to biopsy it and get some fragments that I sent for pathology.  I then advanced a access sheath up and passed the ureteral lesion.  I then proceeded to laser fragment the stone in the ureter  in a dusting fashion.  I then exchanged my semi-rigid scope to the flexible ureteroscope.  The second stone in the lower pole of the left kidney was fragmented into small pieces and then ultimately dusted as best as possible.  I then returned back to the site of the ureteral lesion to ensure that there was plenty of room to navigate through the lesions.  I lasered additional areas of the tumors  prior to placement stents both kidneys.  The retrograde pyelogram on the patient's right side was performed prior to the stent being placed with the above findings.  The case was subsequently terminated because it was clear that he would need another procedure on both ureters.  As such the patient was awoken  And returned to the PACU in  good condition.                                                                                                                                                                                                                                                                                                                                                                                                                               [name], M.D.

## 2019-05-07 NOTE — Transfer of Care (Signed)
Immediate Anesthesia Transfer of Care Note  Patient: David Brandt  Procedure(s) Performed: LEFT URETEROSCOPY/HOLMIUM LASER/STENT PLACEMENT/BIOPSY LEFT MID URETER/RIGHT RETROGRADE STENT PLACEMENT  (Bilateral Ureter)  Patient Location: PACU  Anesthesia Type:General  Level of Consciousness: awake, alert , oriented and patient cooperative  Airway & Oxygen Therapy: Patient Spontanous Breathing and Patient connected to face mask oxygen  Post-op Assessment: Report given to RN, Post -op Vital signs reviewed and stable and Patient moving all extremities X 4  Post vital signs: stable  Last Vitals:  Vitals Value Taken Time  BP 171/90 05/07/19 1432  Temp 36.8 C 05/07/19 1432  Pulse 73 05/07/19 1435  Resp 19 05/07/19 1435  SpO2 96 % 05/07/19 1435  Vitals shown include unvalidated device data.  Last Pain:  Vitals:   05/07/19 1432  TempSrc:   PainSc: Asleep         Complications: No apparent anesthesia complications

## 2019-05-08 ENCOUNTER — Encounter (HOSPITAL_COMMUNITY): Payer: Self-pay | Admitting: Urology

## 2019-05-08 NOTE — Anesthesia Postprocedure Evaluation (Signed)
Anesthesia Post Note  Patient: Treyshon Timlin  Procedure(s) Performed: LEFT URETEROSCOPY/HOLMIUM LASER/STENT PLACEMENT/BIOPSY LEFT MID URETER/RIGHT RETROGRADE STENT PLACEMENT  (Bilateral Ureter)     Patient location during evaluation: PACU Anesthesia Type: General Level of consciousness: awake and alert Pain management: pain level controlled Vital Signs Assessment: post-procedure vital signs reviewed and stable Respiratory status: spontaneous breathing, nonlabored ventilation, respiratory function stable and patient connected to nasal cannula oxygen Cardiovascular status: blood pressure returned to baseline and stable Postop Assessment: no apparent nausea or vomiting Anesthetic complications: no    Last Vitals:  Vitals:   05/07/19 1500 05/07/19 1534  BP: (!) 152/93 (!) 161/96  Pulse: 69 71  Resp: (!) 21   Temp: 36.5 C 36.7 C  SpO2: 92% 94%    Last Pain:  Vitals:   05/07/19 1534  TempSrc: Oral  PainSc:    Pain Goal:                   Jeriann Sayres

## 2019-05-10 LAB — SURGICAL PATHOLOGY

## 2019-05-11 DIAGNOSIS — G4733 Obstructive sleep apnea (adult) (pediatric): Secondary | ICD-10-CM | POA: Diagnosis not present

## 2019-05-19 ENCOUNTER — Telehealth: Payer: Self-pay | Admitting: Cardiovascular Disease

## 2019-05-19 NOTE — Telephone Encounter (Signed)
New Message    David Brandt says the prescription they received last, on aug 5th, they need the numerical value for the cpap pressure setting    Please call

## 2019-05-19 NOTE — Patient Instructions (Addendum)
DUE TO COVID-19 ONLY ONE VISITOR IS ALLOWED TO COME WITH YOU AND STAY IN THE WAITING ROOM ONLY DURING PRE OP AND PROCEDURE DAY OF SURGERY. THE 1 VISITOR MAY VISIT WITH YOU AFTER SURGERY IN YOUR PRIVATE ROOM DURING VISITING HOURS ONLY!   YOUR COVID TEST IS COMPLETED, PLEASE BEGIN THE QUARANTINE INSTRUCTIONS AS OUTLINED IN YOUR HANDOUT.                [facility]    Your procedure is scheduled on: 12-2   Report to [place]  Entrance   Report to admitting at 6:30AM     Call this number if you have problems the morning of surgery [phone]    Remember: Do not eat food or drink liquids :After Midnight. BRUSH YOUR TEETH MORNING OF SURGERY AND RINSE YOUR MOUTH OUT, NO CHEWING GUM CANDY OR MINTS.     Take these medicines the morning of surgery with A SIP OF WATER: CARVEDILOL, FELODIPINE, METOPROLOL, SYMBICORT INHALER IF NEEDED   How to Manage Your Diabetes Before and After Surgery  Why is it important to control my blood sugar before and after surgery? . Improving blood sugar levels before and after surgery helps healing and can limit problems. . A way of improving blood sugar control is eating a healthy diet by: o  Eating less sugar and carbohydrates o  Increasing activity/exercise o  Talking with your doctor about reaching your blood sugar goals . High blood sugars (greater than 180 mg/dL) can raise your risk of infections and slow your recovery, so you will need to focus on controlling your diabetes during the weeks before surgery. . Make sure that the doctor who takes care of your diabetes knows about your planned surgery including the date and location.  How do I manage my blood sugar before surgery? . Check your blood sugar at least 4 times a day, starting 2 days before surgery, to make sure that the level is not too high or low. o Check your blood sugar the morning of your surgery when you wake up and every 2 hours until you get to the Short Stay  unit. . If your blood sugar is less than 70 mg/dL, you will need to treat for low blood sugar: o Do not take insulin. o Treat a low blood sugar (less than 70 mg/dL) with  cup of clear juice (cranberry or apple), 4 glucose tablets, OR glucose gel. o Recheck blood sugar in 15 minutes after treatment (to make sure it is greater than 70 mg/dL). If your blood sugar is not greater than 70 mg/dL on recheck, call [phone] for further instructions. . Report your blood sugar to the short stay nurse when you get to Short Stay.  . If you are admitted to the hospital after surgery: o Your blood sugar will be checked by the staff and you will probably be given insulin after surgery (instead of oral diabetes medicines) to make sure you have good blood sugar levels. o The goal for blood sugar control after surgery is 80-180 mg/dL.   WHAT DO I DO ABOUT MY DIABETES MEDICATION?  DO NOT TAKE ANY DIABETIC MEDICATIONS DAY OF YOUR SURGERY   Reviewed and Endorsed by [facility] Patient Education Committee, August 2015                               You may not have any metal on your body including  hair pins and              piercings  Do not wear jewelry, make-up, lotions, powders or perfumes, deodorant                         Men may shave face and neck.   Do not bring valuables to the hospital. [place].  Contacts, dentures or bridgework may not be worn into surgery.       Patients discharged the day of surgery will not be allowed to drive home. IF YOU ARE HAVING SURGERY AND GOING HOME THE SAME DAY, YOU MUST HAVE AN ADULT TO DRIVE YOU HOME AND BE WITH YOU FOR 24 HOURS. YOU MAY GO HOME BY TAXI OR UBER OR ORTHERWISE, BUT AN ADULT MUST ACCOMPANY YOU HOME AND STAY WITH YOU FOR 24 HOURS.  Name and phone number of your driver:  Special Instructions: N/A              Please read over the following fact sheets you were  given: _____________________________________________________________________             [facility] - Preparing for Surgery Before surgery, you can play an important role.  Because skin is not sterile, your skin needs to be as free of germs as possible.  You can reduce the number of germs on your skin by washing with CHG (chlorahexidine gluconate) soap before surgery.  CHG is an antiseptic cleaner which kills germs and bonds with the skin to continue killing germs even after washing. Please DO NOT use if you have an allergy to CHG or antibacterial soaps.  If your skin becomes reddened/irritated stop using the CHG and inform your nurse when you arrive at Short Stay. Do not shave (including legs and underarms) for at least 48 hours prior to the first CHG shower.  You may shave your face/neck. Please follow these instructions carefully:  1.  Shower with CHG Soap the night before surgery and the  morning of Surgery.  2.  If you choose to wash your hair, wash your hair first as usual with your  normal  shampoo.  3.  After you shampoo, rinse your hair and body thoroughly to remove the  shampoo.                           4.  Use CHG as you would any other liquid soap.  You can apply chg directly  to the skin and wash                       Gently with a scrungie or clean washcloth.  5.  Apply the CHG Soap to your body ONLY FROM THE NECK DOWN.   Do not use on face/ open                           Wound or open sores. Avoid contact with eyes, ears mouth and genitals (private parts).                       Wash face,  Genitals (private parts) with your normal soap.             6.  Wash thoroughly, paying special  attention to the area where your surgery  will be performed.  7.  Thoroughly rinse your body with warm water from the neck down.  8.  DO NOT shower/wash with your normal soap after using and rinsing off  the CHG Soap.                9.  Pat yourself dry with a clean towel.            10.  Wear  clean pajamas.            11.  Place clean sheets on your bed the night of your first shower and do not  sleep with pets. Day of Surgery : Do not apply any lotions/deodorants the morning of surgery.  Please wear clean clothes to the hospital/surgery center.  FAILURE TO FOLLOW THESE INSTRUCTIONS MAY RESULT IN THE CANCELLATION OF YOUR SURGERY PATIENT SIGNATURE_________________________________  NURSE SIGNATURE__________________________________  ________________________________________________________________________

## 2019-05-19 NOTE — Telephone Encounter (Signed)
Will route to sleep coordinator.

## 2019-05-23 ENCOUNTER — Other Ambulatory Visit (HOSPITAL_COMMUNITY)
Admission: RE | Admit: 2019-05-23 | Discharge: 2019-05-23 | Disposition: A | Discharge status: Home / Self Care | Payer: Medicare PPO | Source: Ambulatory Visit | Attending: Urology | Admitting: Urology

## 2019-05-23 DIAGNOSIS — Z01812 Encounter for preprocedural laboratory examination: Secondary | ICD-10-CM | POA: Insufficient documentation

## 2019-05-23 DIAGNOSIS — Z20828 Contact with and (suspected) exposure to other viral communicable diseases: Secondary | ICD-10-CM | POA: Diagnosis not present

## 2019-05-24 LAB — NOVEL CORONAVIRUS, NAA (HOSP ORDER, SEND-OUT TO REF LAB; TAT 18-24 HRS): SARS-CoV-2, NAA: NOT DETECTED

## 2019-05-25 ENCOUNTER — Other Ambulatory Visit: Payer: Self-pay

## 2019-05-25 ENCOUNTER — Encounter (HOSPITAL_COMMUNITY): Payer: Self-pay

## 2019-05-25 ENCOUNTER — Encounter (HOSPITAL_COMMUNITY)
Admission: RE | Admit: 2019-05-25 | Discharge: 2019-05-25 | Disposition: A | Discharge status: Home / Self Care | Payer: Medicare PPO | Source: Ambulatory Visit | Attending: Urology | Admitting: Urology

## 2019-05-25 DIAGNOSIS — E119 Type 2 diabetes mellitus without complications: Secondary | ICD-10-CM | POA: Diagnosis not present

## 2019-05-25 DIAGNOSIS — M1990 Unspecified osteoarthritis, unspecified site: Secondary | ICD-10-CM | POA: Diagnosis not present

## 2019-05-25 DIAGNOSIS — Z951 Presence of aortocoronary bypass graft: Secondary | ICD-10-CM | POA: Insufficient documentation

## 2019-05-25 DIAGNOSIS — N200 Calculus of kidney: Secondary | ICD-10-CM | POA: Insufficient documentation

## 2019-05-25 DIAGNOSIS — I25810 Atherosclerosis of coronary artery bypass graft(s) without angina pectoris: Secondary | ICD-10-CM | POA: Diagnosis not present

## 2019-05-25 DIAGNOSIS — Z79899 Other long term (current) drug therapy: Secondary | ICD-10-CM | POA: Diagnosis not present

## 2019-05-25 DIAGNOSIS — Z87891 Personal history of nicotine dependence: Secondary | ICD-10-CM | POA: Insufficient documentation

## 2019-05-25 DIAGNOSIS — Z7982 Long term (current) use of aspirin: Secondary | ICD-10-CM | POA: Insufficient documentation

## 2019-05-25 DIAGNOSIS — J45909 Unspecified asthma, uncomplicated: Secondary | ICD-10-CM | POA: Insufficient documentation

## 2019-05-25 DIAGNOSIS — I358 Other nonrheumatic aortic valve disorders: Secondary | ICD-10-CM | POA: Insufficient documentation

## 2019-05-25 DIAGNOSIS — I10 Essential (primary) hypertension: Secondary | ICD-10-CM | POA: Insufficient documentation

## 2019-05-25 DIAGNOSIS — Z01812 Encounter for preprocedural laboratory examination: Secondary | ICD-10-CM | POA: Insufficient documentation

## 2019-05-25 DIAGNOSIS — E785 Hyperlipidemia, unspecified: Secondary | ICD-10-CM | POA: Insufficient documentation

## 2019-05-25 DIAGNOSIS — G4730 Sleep apnea, unspecified: Secondary | ICD-10-CM | POA: Diagnosis not present

## 2019-05-25 DIAGNOSIS — Z7984 Long term (current) use of oral hypoglycemic drugs: Secondary | ICD-10-CM | POA: Diagnosis not present

## 2019-05-25 LAB — CBC
HCT: 51.5 % (ref 39.0–52.0)
Hemoglobin: 16.2 g/dL (ref 13.0–17.0)
MCH: 30.1 pg (ref 26.0–34.0)
MCHC: 31.5 g/dL (ref 30.0–36.0)
MCV: 95.5 fL (ref 80.0–100.0)
Platelets: 181 10*3/uL (ref 150–400)
RBC: 5.39 MIL/uL (ref 4.22–5.81)
RDW: 14.3 % (ref 11.5–15.5)
WBC: 10.9 10*3/uL — ABNORMAL HIGH (ref 4.0–10.5)
nRBC: 0 % (ref 0.0–0.2)

## 2019-05-25 LAB — BASIC METABOLIC PANEL WITH GFR
Anion gap: 8 (ref 5–15)
BUN: 16 mg/dL (ref 8–23)
CO2: 29 mmol/L (ref 22–32)
Calcium: 9 mg/dL (ref 8.9–10.3)
Chloride: 104 mmol/L (ref 98–111)
Creatinine, Ser: 1.28 mg/dL — ABNORMAL HIGH (ref 0.61–1.24)
GFR calc Af Amer: >60 mL/min
GFR calc non Af Amer: 56 mL/min — ABNORMAL LOW
Glucose, Bld: 128 mg/dL — ABNORMAL HIGH (ref 70–99)
Potassium: 3.8 mmol/L (ref 3.5–5.1)
Sodium: 141 mmol/L (ref 135–145)

## 2019-05-25 LAB — GLUCOSE, CAPILLARY: Glucose-Capillary: 135 mg/dL — ABNORMAL HIGH (ref 70–99)

## 2019-05-25 NOTE — Progress Notes (Signed)
PCP - Dr. Delma Freeze Cardiologist - Dr. Skeet Latch  Chest x-ray - 04/04/2019 EKG - 04/06/2019 Stress Test - 03/27/2019 ECHO - 03/30/2019 Cardiac Cath - before CABG done 2017  Sleep Study - yes  CPAP - yes  Fasting Blood Sugar -  Checks Blood Sugar __1___ times a day [id number] 04-21-2019 epic    Blood Thinner Instructions:none Aspirin Instructions:Instructed to Stop Aspirin 2 days before surgery  Last Dose:05/24/2019  Anesthesia review:    Patient denies shortness of breath, fever, cough and chest pain via phone call with patient .  Patient has a history of CAD, HTN, OSA and uses CPAP, COPD, DM, S/P CABG in 2017.  Patient verbalized understanding of instructions that were given to them at PAT appt. Patient was also instructed that they will need to review over the  instructions again before surgery

## 2019-05-26 NOTE — Anesthesia Preprocedure Evaluation (Addendum)
Anesthesia Evaluation  Patient identified by MRN, date of birth, ID band Patient awake    Reviewed: Allergy & Precautions, NPO status , Patient's Chart, lab work & pertinent test results  Airway Mallampati: III       Dental  (+) Edentulous Upper, Edentulous Lower   Pulmonary former smoker,    Pulmonary exam normal breath sounds clear to auscultation       Cardiovascular hypertension, Pt. on home beta blockers and Pt. on medications Normal cardiovascular exam Rhythm:Regular Rate:Normal     Neuro/Psych negative neurological ROS  negative psych ROS   GI/Hepatic negative GI ROS, Neg liver ROS,   Endo/Other  diabetes, Type 2, Oral Hypoglycemic AgentsMorbid obesity  Renal/GU      Musculoskeletal   Abdominal Normal abdominal exam  (+)   Peds  Hematology   Anesthesia Other Findings   Reproductive/Obstetrics                                                           Anesthesia Evaluation  Patient identified by MRN, date of birth, ID band Patient awake    Reviewed: Allergy & Precautions, H&P , NPO status , Patient's Chart, lab work & pertinent test results  Airway Mallampati: II   Neck ROM: full    Dental   Pulmonary asthma , sleep apnea , COPD, Current Smoker, former smoker,    breath sounds clear to auscultation       Cardiovascular hypertension, + angina  Rhythm:regular Rate:Normal     Neuro/Psych    GI/Hepatic   Endo/Other  diabetes, Type 2Morbid obesity  Renal/GU Renal InsufficiencyRenal disease     Musculoskeletal  (+) Arthritis ,   Abdominal   Peds  Hematology   Anesthesia Other Findings   Reproductive/Obstetrics                             Anesthesia Physical  Anesthesia Plan  ASA: III  Anesthesia Plan: General   Post-op Pain Management:    Induction: Intravenous  PONV Risk Score and Plan: 1 and Ondansetron,  Dexamethasone, Midazolam and Treatment may vary due to age or medical condition  Airway Management Planned: Oral ETT  Additional Equipment:   Intra-op Plan:   Post-operative Plan: Extubation in OR  Informed Consent: I have reviewed the patients History and Physical, chart, labs and discussed the procedure including the risks, benefits and alternatives for the proposed anesthesia with the patient or authorized representative who has indicated his/her understanding and acceptance.       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 Cardiology clearance per telephone encounter 04/07/19 "Given past medical history and time since last visit, based on ACC/AHA guidelines, Dhyey Mcgee would be at acceptable risk for the planned procedure without further cardiovascular testing. Recent echo and stress test were normal.. Ok to hold aspirin for 5-7 days prior to the procedure and restart as soon as possible afterward."  TTE 03/30/19:  1. Left ventricular ejection fraction, by visual estimation, is 65 to 70%. The left ventricle has normal function. Normal left ventricular size. There is no left ventricular hypertrophy.  2. Left ventricular diastolic Doppler parameters are consistent with pseudonormalization pattern of LV diastolic filling.  3.  Global right ventricle has normal systolic function.The right ventricular size is normal. No increase in right ventricular wall thickness.    Nuclear stress 03/27/19:  Nuclear stress EF: 58%.  There was no ST segment deviation noted during stress.  The study is normal.  This is a low risk study.  The left ventricular ejection fraction is normal (55-65%).   Normal stress nuclear study with no ischemia or infarction.  Gated ejection fraction 58% with normal wall motion.)       Anesthesia Quick Evaluation  Anesthesia Physical Anesthesia Plan  ASA:  III  Anesthesia Plan: General   Post-op Pain Management:    Induction: Intravenous  PONV Risk Score and Plan: Ondansetron and Treatment may vary due to age or medical condition  Airway Management Planned: Oral ETT  Additional Equipment: None  Intra-op Plan:   Post-operative Plan: Extubation in OR  Informed Consent: I have reviewed the patients History and Physical, chart, labs and discussed the procedure including the risks, benefits and alternatives for the proposed anesthesia with the patient or authorized representative who has indicated his/her understanding and acceptance.     Dental advisory given  Plan Discussed with: CRNA  Anesthesia Plan Comments: (See PAT note 05/25/2019, Konrad Felix, PA-C)      Anesthesia Quick Evaluation

## 2019-05-26 NOTE — Progress Notes (Signed)
Anesthesia Chart Review   Case: [id number] Date/Time: 05/27/19 0815   Procedure: BILATERAL URETEROSCOPY/HOLMIUM LASER/STENT EXCHANGE (Bilateral )   Anesthesia type: General   Pre-op diagnosis: BILATERAL NEPHROLITHIASIS   Location: [facility]   Surgeon: [name], MD      DISCUSSION:73 y.o. former smoker (36 pack years, quit 06/25/18) with h/o HTN, HLD, DM II, sleep apnea w/cpap, CABG 2017, bilateral nephrolithiasis scheduled for above procedure 05/27/2019 with Dr. Louis Meckel.   Follows with cardiology for CAD s/p CABG 2017. Cardiology clearance per telephone encounter 04/07/19 "Given past medical history and time since last visit, based on ACC/AHA guidelines, Vien Richburg would be at acceptable risk for the planned procedure without further cardiovascular testing. Recent echo and stress test were normal. I will route this recommendation to the requesting party via Epic fax function and remove from pre-op pool. Please call with questions. Ok to hold aspirin for 5-7 days prior to the procedure and restart as soon as possible afterward."  S/p ureteroscopy and stent placement 05/07/2019 with no anesthesia complications noted.   Anticipate pt can proceed with planned procedure barring acute status change.   VS: BP (!) 156/94   Pulse 77   Temp 36.9 C (Oral)   Resp 18   Ht 5\' 9"  (1.753 m)   Wt (!) 141.5 kg   SpO2 99%   BMI 46.07 kg/m   PROVIDERS: [name], MD is PCP   Skeet Latch, MD is Cardiologist  LABS: Labs reviewed: Acceptable for surgery. (all labs ordered are listed, but only abnormal results are displayed)  Labs Reviewed  GLUCOSE, CAPILLARY - Abnormal; Notable for the following components:      Result Value   Glucose-Capillary 135 (*)    All other components within normal limits  BASIC METABOLIC PANEL - Abnormal; Notable for the following components:   Glucose, Bld 128 (*)    Creatinine, Ser 1.28 (*)    GFR calc non Af Amer 56  (*)    All other components within normal limits  CBC - Abnormal; Notable for the following components:   WBC 10.9 (*)    All other components within normal limits     IMAGES:   EKG: 04/06/2019 Rate 70 bpm  Sinus rhythm  Atrial premature complex Probable left atrial enlargement  Left anterior fascicular block  Anterior infarct, old Borderline repolarization abnormality   CV: Echo 03/30/2019 IMPRESSIONS   1. Left ventricular ejection fraction, by visual estimation, is 65 to 70%. The left ventricle has normal function. Normal left ventricular size. There is no left ventricular hypertrophy.  2. Left ventricular diastolic Doppler parameters are consistent with pseudonormalization pattern of LV diastolic filling.  3. Global right ventricle has normal systolic function.The right ventricular size is normal. No increase in right ventricular wall thickness.  4. Left atrial size was normal.  5. Right atrial size was normal.  6. The mitral valve is normal in structure. No evidence of mitral valve regurgitation. No evidence of mitral stenosis.  7. The tricuspid valve is normal in structure. Tricuspid valve regurgitation was not visualized by color flow Doppler.  8. The aortic valve is normal in structure. Aortic valve regurgitation was not visualized by color flow Doppler. Mild aortic valve sclerosis without stenosis.  9. The pulmonic valve was normal in structure. Pulmonic valve regurgitation is not visualized by color flow Doppler. 10. The inferior vena cava is normal in size with greater than 50% respiratory variability, suggesting right atrial pressure of  3 mmHg.  Myocardial Perfusion Imaging 03/27/2019  Nuclear stress EF: 58%.  There was no ST segment deviation noted during stress.  The study is normal.  This is a low risk study.  The left ventricular ejection fraction is normal (55-65%).   Normal stress nuclear study with no ischemia or infarction.  Gated ejection fraction  58% with normal wall motion. Past Medical History:  Diagnosis Date  . Abnormal cardiovascular stress test 03/29/2016  . Allergic rhinitis   . Allergy   . Arthritis   . Asthma   . Diabetes ([place]) 03/29/2016  . Gout   . History of kidney stones   . Hyperlipidemia   . Hypertension   . Sleep apnea    cpap    Past Surgical History:  Procedure Laterality Date  . CARDIAC CATHETERIZATION N/A 03/29/2016   Procedure: Right/Left Heart Cath and Coronary Angiography;  Surgeon: Nelva Bush, MD;  Location: [place] [facility];  Service: Cardiovascular;  Laterality: N/A;  . CARDIAC CATHETERIZATION N/A 03/29/2016   Procedure: Intravascular Pressure Wire/FFR Study;  Surgeon: Nelva Bush, MD;  Location: [place] [facility];  Service: Cardiovascular;  Laterality: N/A;  . CARDIAC CATHETERIZATION Left 04/02/2016   Procedure: FEMORAL ARTERIAL LINE INSERTION;  Surgeon: [name], MD;  Location: [place];  Service: Open Heart Surgery;  Laterality: Left;  . COLONOSCOPY  02/20/2018   per Dr. Carlean Purl, no polyps, repeat in 5 yrs (previous adenomatous polyps)   . CORONARY ARTERY BYPASS GRAFT N/A 04/02/2016   Procedure: CORONARY ARTERY BYPASS GRAFTING (CABG) X 5 UTILIZING LEFT INTERNAL MAMMARY ARTERY AND ENDOSCOPICALLY HARVESTED GREATER SAPHENOUS VEIN ; Mammary to LAD, Sequencial 1st to 2nd Diagonal, Vein to OM, Vein to Distal RCA.;  Surgeon: [name], MD;  Location: [place];  Service: Open Heart Surgery;  Laterality: N/A;  . CYSTOSCOPY/URETEROSCOPY/HOLMIUM LASER/STENT PLACEMENT Bilateral 05/07/2019   Procedure: LEFT URETEROSCOPY/HOLMIUM LASER/STENT PLACEMENT/BIOPSY LEFT MID URETER/RIGHT RETROGRADE STENT PLACEMENT ;  Surgeon: [name], MD;  [facility];  Service: Urology;  Laterality: Bilateral;  . LYMPH NODE BIOPSY Left 04/02/2016   Procedure: INCIDENTAL LEFT MAMMARY LYMPH NODE BIOPSY;  Surgeon: [name], MD;  Location: [facility];  Service: Open Heart Surgery;  Laterality: Left;  .  mass abdomen  1955  . NEPHROLITHOTOMY Left 04/24/2019   Procedure: NEPHROLITHOTOMY PERCUTANEOUS WITH SURGEON ACCESS;  Surgeon: [name], MD;  Location[facility];  Service: Urology;  Laterality: Left;  . TEE WITHOUT CARDIOVERSION N/A 04/02/2016   Procedure: TRANSESOPHAGEAL ECHOCARDIOGRAM (TEE);  Surgeon: [name], MD;  Location: [place];  Service: Open Heart Surgery;  Laterality: N/A;    MEDICATIONS: . aspirin EC 81 MG tablet  . atorvastatin (LIPITOR) 80 MG tablet  . carvedilol (COREG) 25 MG tablet  . Cholecalciferol (VITAMIN D3) 2000 units TABS  . felodipine (PLENDIL) 10 MG 24 hr tablet  . hydrochlorothiazide (HYDRODIURIL) 12.5 MG tablet  . metFORMIN (GLUCOPHAGE-XR) 500 MG 24 hr tablet  . metoprolol succinate (TOPROL-XL) 50 MG 24 hr tablet  . ondansetron (ZOFRAN ODT) 4 MG disintegrating tablet  . OVER THE COUNTER MEDICATION  . SYMBICORT 160-4.5 MCG/ACT inhaler  . testosterone cypionate (DEPOTESTOSTERONE CYPIONATE) 200 MG/ML injection  . testosterone cypionate (DEPOTESTOSTERONE CYPIONATE) 200 MG/ML injection  . traMADol (ULTRAM) 50 MG tablet   No current facility-administered medications for this encounter.      Maia Plan [facility] Pre-Surgical Testing [phone] 05/26/19  12:52 PM

## 2019-05-27 ENCOUNTER — Ambulatory Visit (HOSPITAL_COMMUNITY): Payer: Medicare PPO

## 2019-05-27 ENCOUNTER — Ambulatory Visit (HOSPITAL_COMMUNITY): Payer: Medicare PPO | Admitting: Certified Registered Nurse Anesthetist

## 2019-05-27 ENCOUNTER — Ambulatory Visit (HOSPITAL_COMMUNITY)
Admission: RE | Admit: 2019-05-27 | Discharge: 2019-05-27 | Disposition: A | Discharge status: Home / Self Care | Payer: Medicare PPO | Attending: Urology | Admitting: Urology

## 2019-05-27 ENCOUNTER — Ambulatory Visit (HOSPITAL_COMMUNITY): Payer: Medicare PPO | Admitting: Physician Assistant

## 2019-05-27 ENCOUNTER — Encounter (HOSPITAL_COMMUNITY): Payer: Self-pay | Admitting: *Deleted

## 2019-05-27 ENCOUNTER — Encounter (HOSPITAL_COMMUNITY)
Admission: RE | Disposition: A | Discharge status: Home / Self Care | Payer: Self-pay | Source: Home / Self Care | Attending: Urology

## 2019-05-27 DIAGNOSIS — E119 Type 2 diabetes mellitus without complications: Secondary | ICD-10-CM | POA: Diagnosis not present

## 2019-05-27 DIAGNOSIS — G4733 Obstructive sleep apnea (adult) (pediatric): Secondary | ICD-10-CM | POA: Diagnosis not present

## 2019-05-27 DIAGNOSIS — Z79899 Other long term (current) drug therapy: Secondary | ICD-10-CM | POA: Insufficient documentation

## 2019-05-27 DIAGNOSIS — Z7984 Long term (current) use of oral hypoglycemic drugs: Secondary | ICD-10-CM | POA: Diagnosis not present

## 2019-05-27 DIAGNOSIS — Z6841 Body Mass Index (BMI) 40.0 and over, adult: Secondary | ICD-10-CM | POA: Insufficient documentation

## 2019-05-27 DIAGNOSIS — Z7951 Long term (current) use of inhaled steroids: Secondary | ICD-10-CM | POA: Insufficient documentation

## 2019-05-27 DIAGNOSIS — N2889 Other specified disorders of kidney and ureter: Secondary | ICD-10-CM | POA: Insufficient documentation

## 2019-05-27 DIAGNOSIS — Z87891 Personal history of nicotine dependence: Secondary | ICD-10-CM | POA: Diagnosis not present

## 2019-05-27 DIAGNOSIS — Z7982 Long term (current) use of aspirin: Secondary | ICD-10-CM | POA: Insufficient documentation

## 2019-05-27 DIAGNOSIS — E1169 Type 2 diabetes mellitus with other specified complication: Secondary | ICD-10-CM | POA: Diagnosis not present

## 2019-05-27 DIAGNOSIS — N200 Calculus of kidney: Secondary | ICD-10-CM | POA: Diagnosis not present

## 2019-05-27 DIAGNOSIS — I10 Essential (primary) hypertension: Secondary | ICD-10-CM | POA: Diagnosis not present

## 2019-05-27 HISTORY — PX: CYSTOSCOPY/URETEROSCOPY/HOLMIUM LASER/STENT PLACEMENT: SHX6546

## 2019-05-27 LAB — GLUCOSE, CAPILLARY
Glucose-Capillary: 118 mg/dL — ABNORMAL HIGH (ref 70–99)
Glucose-Capillary: 106 mg/dL — ABNORMAL HIGH (ref 70–99)

## 2019-05-27 SURGERY — CYSTOSCOPY/URETEROSCOPY/HOLMIUM LASER/STENT PLACEMENT
Anesthesia: General | Laterality: Bilateral

## 2019-05-27 MED ORDER — BELLADONNA ALKALOIDS-OPIUM 16.2-30 MG RE SUPP
RECTAL | Status: AC
  Filled 2019-05-27: qty 1

## 2019-05-27 MED ORDER — PHENYLEPHRINE HCL (PRESSORS) 10 MG/ML IV SOLN
INTRAVENOUS | Status: AC
  Filled 2019-05-27: qty 1

## 2019-05-27 MED ORDER — SODIUM CHLORIDE 0.9 % IR SOLN
Status: DC | PRN
  Administered 2019-05-27: 9000 mL via INTRAVESICAL

## 2019-05-27 MED ORDER — FENTANYL CITRATE (PF) 100 MCG/2ML IJ SOLN: 25.0000 ug | INTRAMUSCULAR | Status: DC | PRN

## 2019-05-27 MED ORDER — ACETAMINOPHEN 10 MG/ML IV SOLN: 1000.0000 mg | Freq: Once | INTRAVENOUS | Status: DC | PRN

## 2019-05-27 MED ORDER — PHENYLEPHRINE HCL-NACL 10-0.9 MG/250ML-% IV SOLN
INTRAVENOUS | Status: DC | PRN
  Administered 2019-05-27: 60 ug/min via INTRAVENOUS

## 2019-05-27 MED ORDER — ACETAMINOPHEN 160 MG/5ML PO SOLN: 325.0000 mg | ORAL | Status: DC | PRN

## 2019-05-27 MED ORDER — IOHEXOL 300 MG/ML  SOLN
INTRAMUSCULAR | Status: DC | PRN
  Administered 2019-05-27: 40 mL

## 2019-05-27 MED ORDER — ONDANSETRON HCL 4 MG/2ML IJ SOLN
INTRAMUSCULAR | Status: DC | PRN
  Administered 2019-05-27: 4 mg via INTRAVENOUS

## 2019-05-27 MED ORDER — LIDOCAINE 2% (20 MG/ML) 5 ML SYRINGE
INTRAMUSCULAR | Status: AC
  Filled 2019-05-27: qty 5

## 2019-05-27 MED ORDER — CIPROFLOXACIN IN D5W 400 MG/200ML IV SOLN
400.0000 mg | INTRAVENOUS | Status: AC
  Administered 2019-05-27: 400 mg via INTRAVENOUS
  Filled 2019-05-27: qty 200

## 2019-05-27 MED ORDER — MEPERIDINE HCL 50 MG/ML IJ SOLN: 6.2500 mg | INTRAMUSCULAR | Status: DC | PRN

## 2019-05-27 MED ORDER — SUCCINYLCHOLINE CHLORIDE 200 MG/10ML IV SOSY
PREFILLED_SYRINGE | INTRAVENOUS | Status: AC
  Filled 2019-05-27: qty 10

## 2019-05-27 MED ORDER — LACTATED RINGERS IV SOLN
INTRAVENOUS | Status: DC
  Administered 2019-05-27 (×2): via INTRAVENOUS

## 2019-05-27 MED ORDER — PHENAZOPYRIDINE HCL 200 MG PO TABS: 200.0000 mg | ORAL_TABLET | Freq: Three times a day (TID) | ORAL | 0 refills | Status: DC | PRN

## 2019-05-27 MED ORDER — LIDOCAINE HCL URETHRAL/MUCOSAL 2 % EX GEL
CUTANEOUS | Status: DC | PRN
  Administered 2019-05-27: 1 via URETHRAL

## 2019-05-27 MED ORDER — ONDANSETRON HCL 4 MG/2ML IJ SOLN
INTRAMUSCULAR | Status: AC
  Filled 2019-05-27: qty 2

## 2019-05-27 MED ORDER — OXYCODONE HCL 5 MG PO TABS: 5.0000 mg | ORAL_TABLET | Freq: Once | ORAL | Status: DC | PRN

## 2019-05-27 MED ORDER — FENTANYL CITRATE (PF) 100 MCG/2ML IJ SOLN
INTRAMUSCULAR | Status: DC | PRN
  Administered 2019-05-27 (×2): 50 ug via INTRAVENOUS
  Administered 2019-05-27: 100 ug via INTRAVENOUS
  Administered 2019-05-27: 50 ug via INTRAVENOUS

## 2019-05-27 MED ORDER — PHENYLEPHRINE 40 MCG/ML (10ML) SYRINGE FOR IV PUSH (FOR BLOOD PRESSURE SUPPORT)
PREFILLED_SYRINGE | INTRAVENOUS | Status: DC | PRN
  Administered 2019-05-27 (×2): 120 ug via INTRAVENOUS
  Administered 2019-05-27: 80 ug via INTRAVENOUS
  Administered 2019-05-27 (×2): 120 ug via INTRAVENOUS
  Administered 2019-05-27 (×2): 80 ug via INTRAVENOUS
  Administered 2019-05-27: 120 ug via INTRAVENOUS

## 2019-05-27 MED ORDER — ROCURONIUM BROMIDE 10 MG/ML (PF) SYRINGE
PREFILLED_SYRINGE | INTRAVENOUS | Status: AC
  Filled 2019-05-27: qty 10

## 2019-05-27 MED ORDER — ACETAMINOPHEN 325 MG PO TABS: 325.0000 mg | ORAL_TABLET | ORAL | Status: DC | PRN

## 2019-05-27 MED ORDER — TRAMADOL HCL 50 MG PO TABS: 50.0000 mg | ORAL_TABLET | Freq: Four times a day (QID) | ORAL | 0 refills | Status: DC | PRN

## 2019-05-27 MED ORDER — LIDOCAINE HCL URETHRAL/MUCOSAL 2 % EX GEL
CUTANEOUS | Status: AC
  Filled 2019-05-27: qty 30

## 2019-05-27 MED ORDER — 0.9 % SODIUM CHLORIDE (POUR BTL) OPTIME
TOPICAL | Status: DC | PRN
  Administered 2019-05-27: 1000 mL

## 2019-05-27 MED ORDER — OXYCODONE HCL 5 MG/5ML PO SOLN: 5.0000 mg | Freq: Once | ORAL | Status: DC | PRN

## 2019-05-27 MED ORDER — LIDOCAINE 2% (20 MG/ML) 5 ML SYRINGE
INTRAMUSCULAR | Status: DC | PRN
  Administered 2019-05-27: 100 mg via INTRAVENOUS

## 2019-05-27 MED ORDER — PROPOFOL 10 MG/ML IV BOLUS
INTRAVENOUS | Status: DC | PRN
  Administered 2019-05-27: 250 mg via INTRAVENOUS

## 2019-05-27 MED ORDER — FENTANYL CITRATE (PF) 250 MCG/5ML IJ SOLN
INTRAMUSCULAR | Status: AC
  Filled 2019-05-27: qty 5

## 2019-05-27 MED ORDER — PROPOFOL 10 MG/ML IV BOLUS
INTRAVENOUS | Status: AC
  Filled 2019-05-27: qty 40

## 2019-05-27 MED ORDER — PROMETHAZINE HCL 25 MG/ML IJ SOLN: 6.2500 mg | INTRAMUSCULAR | Status: DC | PRN

## 2019-05-27 MED ORDER — SUCCINYLCHOLINE CHLORIDE 200 MG/10ML IV SOSY
PREFILLED_SYRINGE | INTRAVENOUS | Status: DC | PRN
  Administered 2019-05-27: 160 mg via INTRAVENOUS

## 2019-05-27 MED ORDER — PHENYLEPHRINE 40 MCG/ML (10ML) SYRINGE FOR IV PUSH (FOR BLOOD PRESSURE SUPPORT)
PREFILLED_SYRINGE | INTRAVENOUS | Status: AC
  Filled 2019-05-27: qty 30

## 2019-05-27 MED ORDER — BELLADONNA ALKALOIDS-OPIUM 16.2-60 MG RE SUPP
RECTAL | Status: DC | PRN
  Administered 2019-05-27: 1 via RECTAL

## 2019-05-27 SURGICAL SUPPLY — 23 items
BAG URO CATCHER STRL LF (MISCELLANEOUS) ×2 IMPLANT
BASKET ZERO TIP NITINOL 2.4FR (BASKET) IMPLANT
BSKT STON RTRVL ZERO TP 2.4FR (BASKET)
CATH URET 5FR 28IN OPEN ENDED (CATHETERS) ×2 IMPLANT
CATH URET DUAL LUMEN 6-10FR 50 (CATHETERS) ×1 IMPLANT
CLOTH BEACON ORANGE TIMEOUT ST (SAFETY) ×2 IMPLANT
EXTRACTOR STONE 1.7FRX115CM (UROLOGICAL SUPPLIES) IMPLANT
EXTRACTOR STONE NITINOL NGAGE (UROLOGICAL SUPPLIES) ×3 IMPLANT
FIBER LASER TRAC TIP (UROLOGICAL SUPPLIES) ×1 IMPLANT
GLOVE BIOGEL M STRL SZ7.5 (GLOVE) ×3 IMPLANT
GOWN STRL REUS W/TWL XL LVL3 (GOWN DISPOSABLE) ×2 IMPLANT
GUIDEWIRE ANG ZIPWIRE 038X150 (WIRE) IMPLANT
GUIDEWIRE STR DUAL SENSOR (WIRE) ×3 IMPLANT
KIT TURNOVER KIT A (KITS) ×1 IMPLANT
MANIFOLD NEPTUNE II (INSTRUMENTS) ×2 IMPLANT
PACK CYSTO (CUSTOM PROCEDURE TRAY) ×2 IMPLANT
SHEATH URETERAL 12FRX28CM (UROLOGICAL SUPPLIES) IMPLANT
SHEATH URETERAL 12FRX35CM (MISCELLANEOUS) ×1 IMPLANT
STENT CONTOUR 6FRX24X.038 (STENTS) ×1 IMPLANT
STENT URET 6FRX24 CONTOUR (STENTS) ×1 IMPLANT
TUBING CONNECTING 10 (TUBING) ×2 IMPLANT
TUBING UROLOGY SET (TUBING) ×2 IMPLANT
WIRE COONS/BENSON .038X145CM (WIRE) IMPLANT

## 2019-05-27 NOTE — H&P (Signed)
Acute Kidney Stone  HPI: David Brandt is a 73 year-old male patient who was referred by Dr. Ishmael Holter. [name], MD who is here for further eval and management of kidney stones.  He was diagnosed with a kidney stone on 04/04/2019. The patient presented to [facility] ED with symptoms of a kidney stone.   His pain started about 04/04/2019. The pain is on the left side.   Abdomen/Pelvic CT: Apr 04, 2019. The patient underwent CT scan prior to today's appointment.   The patient relates initially having flank pain. He is currently having back pain. He denies having flank pain, groin pain, nausea, vomiting, fever, chills, and voiding symptoms. He has been taking oxycodone 5/325 mg. He has not caught a stone in his urine strainer since his symptoms began.   He has had [facility] for treatment of his stones in the past. This is not his first kidney stone. His first stone was approximately 03/26/1999. He has had 1 stones prior to getting this one.   Patient presented to ER for evaluation of abdominal pain which localized to the left lower quadrant. Patient then developed left flank and lower abdomen.   The CT scan shows extensive stone within the mid left ureter as well as the upper and mid pole calices of the left kidney. There is delayed uptake of the contrast into the patient's left kidney.   Since the patient was in the see ER he has not had any significant pain. He denies any fevers or chills. He denies any dysuria or hematuria.   The patient has an extensive cardiac history and had open heart surgery in 2017. Since that time he has been doing reasonably well. He can walk a flight of steps, but does get short of breath. He is morbidly obese. His diabetes appears to be fairly well controlled.     ALLERGIES: Allopurinol TABS sulfa valsartan    MEDICATIONS: Hydrochlorothiazide 12.5 mg tablet  Metformin Hcl 500 mg tablet  Metoprolol Succinate 50 mg tablet, extended release 24 hr  Metoprolol  Tartrate 25 mg tablet  Aspirin Ec 81 mg tablet, delayed release  Atorvastatin Calcium 40 mg tablet  Atorvastatin Calcium 80 mg tablet  Carvedilol 25 mg tablet  Felodipine Er 10 mg tablet, extended release 24 hr  Omega XL  Symbicort 160 mcg-4.5 mcg/actuation hfa aerosol with adapter  Vitamin D3     GU PSH: None     PSH Notes: Cardiac Catheterization: right/left heart cath and coronary angiography (2017), coronary artery bypass graft, lymph node biopsy (2017), TEE without cardioversion (2017)   NON-GU PSH: Coronary Artery Bypass Grafting, 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 Has smoked since 03/25/2016. Smokes 1 pack per day.   Tobacco Use Assessment Completed: Used Tobacco in last 30 days? Drinks 4 drinks per week. Types of alcohol consumed: Beer.  Drinks 1 caffeinated drink per day. Patient's occupation is/was Retired.    REVIEW OF SYSTEMS:    GU Review Male:  Patient denies frequent urination, hard to postpone urination, burning/ pain with urination, get up at night to urinate, leakage of urine, stream starts and stops, trouble starting your stream, have to strain to urinate , erection problems, and penile pain.  Gastrointestinal (Upper):   Patient denies nausea, vomiting, and indigestion/ heartburn.  Gastrointestinal (Lower):   Patient denies diarrhea and constipation.  Constitutional:   Patient denies fever, night sweats, fatigue, and weight  loss.  Skin:   Patient denies skin rash/ lesion and itching.  Eyes:   Patient denies blurred vision and double vision.  Ears/ Nose/ Throat:   Patient denies sore throat and sinus problems.  Hematologic/Lymphatic:   Patient denies swollen glands and easy bruising.  Cardiovascular:   Patient denies leg swelling and chest pains.  Respiratory:   Patient reports shortness of breath. Patient denies cough.  Endocrine:   Patient denies excessive thirst.  Musculoskeletal:   Patient reports back pain. Patient denies joint pain.  Neurological:   Patient denies headaches and dizziness.  Psychologic:   Patient denies depression and anxiety.   VITAL SIGNS:      04/06/2019 01:30 PM  Weight 325 lb / 147.42 kg  Height 69 in / 175.26 cm  BP 144/87 mmHg  Pulse 76 /min  Temperature 97.7 F / 36.5 C  BMI 48.0 kg/m   MULTI-SYSTEM PHYSICAL EXAMINATION:    Constitutional: Obese. Poor hygiene. No physical deformities. Normally developed.   Neck: Neck symmetrical, not swollen. Normal tracheal position.  Respiratory: Normal breath sounds. No labored breathing, no use of accessory muscles.   Cardiovascular: Regular rate and rhythm. No murmur, no gallop. Sternotomy scar. Normal temperature, normal extremity pulses, no swelling, no varicosities.   Lymphatic: No enlargement of neck, axillae, groin.  Skin: No paleness, no jaundice, no cyanosis. No lesion, no ulcer, no rash.  Neurologic / Psychiatric: Oriented to time, oriented to place, oriented to person. No depression, no anxiety, no agitation.  Gastrointestinal: No mass, no tenderness, no rigidity, non obese abdomen.  Eyes: Normal conjunctivae. Normal eyelids.  Ears, Nose, Mouth, and Throat: Left ear no scars, no lesions, no masses. Right ear no scars, no lesions, no masses. Nose no scars, no lesions, no masses. Normal hearing. Normal lips.  Musculoskeletal: Normal gait and station of head and neck.     PAST DATA REVIEWED:  Source Of History:  Patient   Records Review:   Previous Doctor Records, Previous Patient Records, POC Tool  X-Ray Review: C.T. Abdomen/Pelvis: Reviewed Films. Discussed With Patient.     PROCEDURES:          Urinalysis w/Scope Dipstick Dipstick Cont'd Micro  Color: Amber Bilirubin: Neg mg/dL WBC/hpf: 20 - 40/hpf  Appearance: Slightly Cloudy Ketones: Neg mg/dL RBC/hpf: 20 - 40/hpf  Specific Gravity: 1.020 Blood: 2+ ery/uL Bacteria: Few (10-25/hpf)  pH: 6.0 Protein: 1+ mg/dL Cystals: NS (Not Seen)  Glucose: Neg mg/dL Urobilinogen: 0.2 mg/dL Casts: NS (Not Seen)    Nitrites: Neg Trichomonas: Not Present    Leukocyte Esterase: 2+ leu/uL Mucous: Not Present      Epithelial Cells: 0 - 5/hpf      Yeast: NS (Not Seen)      Sperm: Not Present    ASSESSMENT:      ICD-10 Details  1 GU:   Renal and ureteral calculus - N20.2    PLAN:           Orders Labs Urine Culture          Document Letter(s):  Created for  Patient: Clinical Summary         Notes:   As it relates to the surgery itself I discussed the fact that the patient would be prone, and explained the renal access component. We then discussed the stone removal process as well as a postprocedure stent placement. I discussed the risks of this operation which predominantly include bleeding and damage to the surrounding structures. I also described for him the possibility of difficulty obtaining access which may require an additional surgery. After going through surgery, potential complications, and expected outcome, the patient has agreed to proceed with the operation.   Fortunately, his pain at this time seems to be reasonably well controlled. I am happy refill his pain medication as needed. He will contact us if that becomes necessary.   The patient does need cardiac clearance.

## 2019-05-27 NOTE — Op Note (Signed)
Preoperative diagnosis:  1. Bilateral kidney stones.  Postoperative diagnosis:  1. Same  Procedure: 1. Cystoscopy, bilateral retrograde pyelograms with interpretation 2. Bilateral laser lithotripsy, stent exchange  Surgeon: [name], MD  Anesthesia: General  Complications: None  Intraoperative findings:  #1: The patient right retrograde pyelogram was performed using 10 cc of Omnipaque contrast demonstrating normal caliber ureter with no significant filling defects or abnormalities.  There was a large filling defect in the lower pole as well as the midpole calyx consistent with the patient's CT scan and her known stones. 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  In addition, there was some haziness on the fluoroscopy in the renal pelvis, but there were no other large filling defects or abnormalities. #3: A large majority of the stones were removed on the patient's right side, the residual stones were dusted into small fragments. #4: The residual stones in the patient's left side were mostly small enough to pass, there were some larger stones that I did dusting the smaller fragments, there was still quite a bit of stone burden in the patient's interpolar calyx. #5: Bilateral 6 x 24 cm double-J ureteral stents were placed.  EBL: Minimal  Specimens: None  Indication: David Brandt is a 73 y.o. 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  He presents today for staged ureteroscopy.  After reviewing the management options for treatment, he elected to proceed with the  above surgical procedure(s). We have discussed the potential benefits and risks of the procedure, side effects of the proposed treatment, the likelihood of the patient achieving the goals of the procedure, and any potential problems that might occur during the procedure or recuperation. Informed consent has been obtained.  Description of procedure:  The patient was taken to the operating room and general anesthesia was induced.  The patient was placed in the dorsal lithotomy position, prepped and draped in the usual sterile fashion, and preoperative antibiotics were administered. A preoperative time-out was performed.   21 French 30 degrees cystoscope was gently passed to the patient's urethra and into the bladder under visual guidance.  The stent emanating from the patient's right ureteral orifice was brought to the urethral meatus using a stent grasper.  I then advanced a wire through the stent and remove the stent over the wire.  I then advanced a dual-lumen catheter and performed retrograde pyelogram with the above findings.  I then advanced a second wire up through the dual-lumen cath and remove the catheter over the wire.  I then passed the 35 cm 12/14 French ureteral access sheath over the second wire and into the left proximal ureter removing the inner portion as well as the second wire.  I then performed pyeloscopy and encountered the stones in the mid calyx as well as the lower pole calyx.  Using a 200 m laser fiber I fragmented the stone into numerous small pieces and then removed most of those pieces through the sheath using an engage basket.  I then opted at this point to just the remaining aspect of the stone given the amount of stone that  had already been removed and the patient total stone burden.  This seemed to be the most efficient way.  Once all the stone fragments had been chipped and dusted to an size that seemed reasonable to pass I slowly backed out the ureteroscope noting no  significant ureteral trauma.  I then advanced the cystoscope back and over the wire and into the bladder to facilitate stent placement.  I advanced a 24 cm 6 French ureteral stent over a wire and into the right renal pelvis under fluoroscopic guidance.  A nice curl was noted in the bladder as well.  I then grasped the stent from the patient's left ureteral orifice and brought that to the urethral meatus.  I wired the stent remove the stent over wire.  I then advanced a dual-lumen catheter and performed retrograde pyelogram with above findings.  I then advanced a second ureteral stent into the left renal pelvis and remove the dual-lumen catheter over the wire.  I then advanced a ureteral access sheath to the proximal ureter removing the inner portion as well as second wire.  I then again performed pyeloscopy noting that there were a large amount of stone fragments within the renal pelvis.  Most of these were small enough to pass, but there were several big stones that needed to be fragmented into the smaller pieces.  I did remove some of the pieces, but opted to just the stones as best as possible to ensure that they were small enough to pass.  However, there was a large amount of stone burden, and may be some stone fragments that were too big to pass.  However, after 3 hours of ureteroscopy, I opted to place a stent and potentially staged procedure.  At this point I slowly backed out the ureteroscope removing the access sheath simultaneously.  I lasered the polyps within the ureter that were remaining, that had been previously biopsied and noted to be von Bruns nests.  I then backloaded the cystoscope over the second wire and returned to the patient's bladder.  I then advanced a 24 cm time 6 French double-J ureteral stent over the wire and into the left renal pelvis.  A nice curl was noted under fluoroscopy.  The curl in the bladder was also noted to be in good position.  I subsequently emptied the patient's  bladder.  I removed the scope and injected lidocaine jelly into the patient's urethra.  I then placed a B&O suppository.  The patient was subsequently extubated return the PACU in stable condition.  Disposition: The patient will be followed up in 2 weeks with a CT scan prior.   [name], M.D.

## 2019-05-27 NOTE — Transfer of Care (Signed)
Immediate Anesthesia Transfer of Care Note  Patient: David Brandt  Procedure(s) Performed: CYSTOSCOPY, URETEROSCOPY/ RETROGRADE PYELOGRAM/ HOLMIUM LASER/STENT EXCHANGE (Bilateral )  Patient Location: PACU  Anesthesia Type:General  Level of Consciousness: drowsy and patient cooperative  Airway & Oxygen Therapy: Patient Spontanous Breathing and Patient connected to face mask oxygen  Post-op Assessment: Report given to RN and Post -op Vital signs reviewed and stable  Post vital signs: Reviewed and stable  Last Vitals:  Vitals Value Taken Time  BP 133/81 05/27/19 1147  Temp    Pulse 66 05/27/19 1149  Resp 23 05/27/19 1149  SpO2 100 % 05/27/19 1149  Vitals shown include unvalidated device data.  Last Pain:  Vitals:   05/27/19 0703  TempSrc: Oral         Complications: No apparent anesthesia complications

## 2019-05-27 NOTE — Anesthesia Procedure Notes (Signed)
Procedure Name: Intubation Date/Time: 05/27/2019 8:32 AM Performed by: Montel Clock, CRNA Pre-anesthesia Checklist: Patient identified, Emergency Drugs available, Suction available, Patient being monitored and Timeout performed Patient Re-evaluated:Patient Re-evaluated prior to induction Oxygen Delivery Method: Circle system utilized Preoxygenation: Pre-oxygenation with 100% oxygen Induction Type: IV induction and Rapid sequence Laryngoscope Size: Mac and 4 Grade View: Grade I Tube type: Oral Tube size: 7.5 mm Number of attempts: 1 Airway Equipment and Method: Stylet Placement Confirmation: ETT inserted through vocal cords under direct vision,  positive ETCO2 and breath sounds checked- equal and bilateral Secured at: 23 cm Tube secured with: Tape Dental Injury: Teeth and Oropharynx as per pre-operative assessment

## 2019-05-27 NOTE — Discharge Instructions (Signed)
DISCHARGE INSTRUCTIONS FOR KIDNEY STONE/URETERAL STENT   MEDICATIONS:  1.  Resume all your other meds from home - except do not take any extra narcotic pain meds that you may have at home.  2. Pyridium is to help with the burning/stinging when you urinate. 3. Tramadol is for moderate/severe pain, otherwise taking upto 1000 mg every 6 hours of plainTylenol will help treat your pain.    ACTIVITY:  1. No strenuous activity x 1week  2. No driving while on narcotic pain medications  3. Drink plenty of water  4. Continue to walk at home - you can still get blood clots when you are at home, so keep active, but don't over do it.  5. May return to work/school tomorrow or when you feel ready   BATHING:  1. You can shower and we recommend daily showers    SIGNS/SYMPTOMS TO CALL:  Please call us if you have a fever greater than 101.5, uncontrolled nausea/vomiting, uncontrolled pain, dizziness, unable to urinate, bloody urine, chest pain, shortness of breath, leg swelling, leg pain, redness around wound, drainage from wound, or any other concerns or questions.   You can reach [name] at [phone].   FOLLOW-UP:  1. We will schedule you for f/u in 2 weeks w a CT scan prior to assess for residual stone fragments.

## 2019-05-27 NOTE — Interval H&P Note (Signed)
History and Physical Interval Note: Patient presents today for the third stage of his bilateral ureteroscopy and stone removal procedure.  He tolerated stents well and has no changes to his history and physical.  Plan is to proceed as planned. 05/27/2019 8:15 AM  David Brandt  has presented today for surgery, with the diagnosis of BILATERAL NEPHROLITHIASIS.  The various methods of treatment have been discussed with the patient and family. After consideration of risks, benefits and other options for treatment, the patient has consented to  Procedure(s): BILATERAL URETEROSCOPY/HOLMIUM LASER/STENT EXCHANGE (Bilateral) as a surgical intervention.  [name] history has been reviewed, patient examined, no change in status, stable for surgery.  I have reviewed the patient's chart and labs.  Questions were answered to the patient's satisfaction.     [name]

## 2019-05-28 ENCOUNTER — Encounter (HOSPITAL_COMMUNITY): Payer: Self-pay | Admitting: Urology

## 2019-05-28 NOTE — Anesthesia Postprocedure Evaluation (Signed)
Anesthesia Post Note  Patient: David Brandt  Procedure(s) Performed: CYSTOSCOPY, URETEROSCOPY/ RETROGRADE PYELOGRAM/ HOLMIUM LASER/STENT EXCHANGE (Bilateral )     Patient location during evaluation: PACU Anesthesia Type: General Level of consciousness: awake Pain management: pain level controlled Vital Signs Assessment: post-procedure vital signs reviewed and stable Respiratory status: spontaneous breathing Cardiovascular status: stable Postop Assessment: no apparent nausea or vomiting Anesthetic complications: no    Last Vitals:  Vitals:   05/27/19 1215 05/27/19 1230  BP:  123/69  Pulse:  60  Resp: 18 14  Temp: 37 C 36.4 C  SpO2:  93%    Last Pain:  Vitals:   05/27/19 1230  TempSrc:   PainSc: 0-No pain   Pain Goal:                   Huston Foley

## 2019-06-01 DIAGNOSIS — N132 Hydronephrosis with renal and ureteral calculous obstruction: Secondary | ICD-10-CM | POA: Diagnosis not present

## 2019-06-01 DIAGNOSIS — N202 Calculus of kidney with calculus of ureter: Secondary | ICD-10-CM | POA: Diagnosis not present

## 2019-06-02 ENCOUNTER — Telehealth: Payer: Self-pay | Admitting: Cardiovascular Disease

## 2019-06-02 NOTE — Telephone Encounter (Signed)
New Message    David Brandt is calling from [facility] says they are missing information for a cpap replacement  Fax number [phone] Please call

## 2019-06-03 NOTE — Telephone Encounter (Signed)
Number has been ordered. Waiting for provider signature.

## 2019-06-08 DIAGNOSIS — N202 Calculus of kidney with calculus of ureter: Secondary | ICD-10-CM | POA: Diagnosis not present

## 2019-06-23 ENCOUNTER — Telehealth (INDEPENDENT_AMBULATORY_CARE_PROVIDER_SITE_OTHER): Payer: Medicare PPO | Admitting: Cardiovascular Disease

## 2019-06-23 ENCOUNTER — Telehealth: Payer: Self-pay | Admitting: Cardiovascular Disease

## 2019-06-23 ENCOUNTER — Encounter: Payer: Self-pay | Admitting: Cardiovascular Disease

## 2019-06-23 VITALS — BP 159/82 | HR 71 | Wt 315.0 lb

## 2019-06-23 DIAGNOSIS — G4733 Obstructive sleep apnea (adult) (pediatric): Secondary | ICD-10-CM | POA: Diagnosis not present

## 2019-06-23 DIAGNOSIS — I2510 Atherosclerotic heart disease of native coronary artery without angina pectoris: Secondary | ICD-10-CM | POA: Diagnosis not present

## 2019-06-23 DIAGNOSIS — I10 Essential (primary) hypertension: Secondary | ICD-10-CM

## 2019-06-23 DIAGNOSIS — Z79899 Other long term (current) drug therapy: Secondary | ICD-10-CM | POA: Diagnosis not present

## 2019-06-23 MED ORDER — SPIRONOLACTONE 25 MG PO TABS: 25.0000 mg | ORAL_TABLET | Freq: Every day | ORAL | 3 refills | Status: DC

## 2019-06-23 NOTE — Patient Instructions (Signed)
Medication Instructions:   Stop taking the Metoprolol. Start taking Spirolactone 25 mg every day.   *If you need a refill on your cardiac medications before your next appointment, please call your pharmacy*  Lab Work: Your physician recommends that you return for lab work in: 1 week  For BMP  If you have labs (blood work) drawn today and your tests are completely normal, you will receive your results only by: Marland Kitchen MyChart Message (if you have MyChart) OR . A paper copy in the mail If you have any lab test that is abnormal or we need to change your treatment, we will call you to review the results.  Testing/Procedures: None  Follow-Up: At [facility], you and your health needs are our priority.  As part of our continuing mission to provide you with exceptional heart care, we have created designated Provider Care Teams.  These Care Teams include your primary Cardiologist (physician) and Advanced Practice Providers (APPs -  Physician Assistants and Nurse Practitioners) who all work together to provide you with the care you need, when you need it.  Your next appointment:   1 month(s)  The format for your next appointment:   Either In Person or Virtual  Provider:   Skeet Latch, MD  Other Instructions Increase exercise up to 150 minutes per week.

## 2019-06-23 NOTE — Progress Notes (Signed)
Virtual Visit via Video Note   This visit type was conducted due to national recommendations for restrictions regarding the COVID-19 Pandemic (e.g. social distancing) in an effort to limit this patient's exposure and mitigate transmission in our community.  Due to his co-morbid illnesses, this patient is at least at moderate risk for complications without adequate follow up.  This format is felt to be most appropriate for this patient at this time.  All issues noted in this document were discussed and addressed.  A limited physical exam was performed with this format.  Please refer to the patient's chart for his consent to telehealth for [facility].   Date:  06/23/2019   ID:  David Brandt, DOB [birth year], MRN [id number]  Patient Location: Home Provider Location: Home  PCP:  David Morale, MD  Cardiologist:  David Latch, MD  Electrophysiologist:  None   Evaluation Performed:  Follow-Up Visit  Chief Complaint:  hypertension  History of Present Illness:    David Brandt is a 73 y.o. male with CAD s/p CABG (LIMA-->LAD, SVG-->D1,D2, SVG-->OM, SVG-->RCA) , diabetes, OSA on CPAP, post-operative atrial fibrillation, hypertension, emphysema, and hyperlipidemia who presents for follow.   David Brandt initially presented with chest pressure and shortness of breath 03/2016.  He underwent Lexiscan Myoview on 03/28/16 that revealed LVEF 48% with a large inferior and inferior lateral MI and no evidence of ischemia. There was also basal to mid inferior wall akinesis.  He subsequently underwent left heart catheterization where he was found to have severe three-vessel CAD.  He underwent CABG with Dr. Servando Snare on 04/02/16.  He developed post-operative atrial fibrillation and was started on amiodarone.  There were were no other postoperative complications and he was discharged 04/11/16.  Echo 03/30/16 revealed LVEF 55-60% and was otherwise unremarkable.  Amiodarone was discontinued 07/2016.     Since his last appointment David Brandt has been doing well.  He hasn't been exercising much due to the coronavirus.  He is no longer going to the gym.  He just bought an exercise bike and treadmill.  He has also been treated three times for kidney stones.  David Brandt denies chest pain and his breathing has been stable.  He hasn't needed to use his inhaler.  He notes that his BP is elevated 2-3 times per week.  He has been feeling well and denies headache or vision changes.  He has no lower extremity edema, orthopnea or PND.  The patient does not have symptoms concerning for COVID-19 infection (fever, chills, cough, or new shortness of breath).    Past Medical History:  Diagnosis Date  . Abnormal cardiovascular stress test 03/29/2016  . Allergic rhinitis   . Allergy   . Arthritis   . Asthma   . Diabetes ([place]) 03/29/2016  . Gout   . History of kidney stones   . Hyperlipidemia   . Hypertension   . Sleep apnea    cpap   Past Surgical History:  Procedure Laterality Date  . CARDIAC CATHETERIZATION N/A 03/29/2016   Procedure: Right/Left Heart Cath and Coronary Angiography;  Surgeon: Nelva Bush, MD;  Location: [place] [facility];  Service: Cardiovascular;  Laterality: N/A;  . CARDIAC CATHETERIZATION N/A 03/29/2016   Procedure: Intravascular Pressure Wire/FFR Study;  Surgeon: Nelva Bush, MD;  Location: [place] [facility];  Service: Cardiovascular;  Laterality: N/A;  . CARDIAC CATHETERIZATION Left 04/02/2016   Procedure: FEMORAL ARTERIAL LINE INSERTION;  Surgeon: [name], MD;  Location: [place];  Service: Open  Heart Surgery;  Laterality: Left;  . COLONOSCOPY  02/20/2018   per Dr. Carlean Purl, no polyps, repeat in 5 yrs (previous adenomatous polyps)   . CORONARY ARTERY BYPASS GRAFT N/A 04/02/2016   Procedure: CORONARY ARTERY BYPASS GRAFTING (CABG) X 5 UTILIZING LEFT INTERNAL MAMMARY ARTERY AND ENDOSCOPICALLY HARVESTED GREATER SAPHENOUS VEIN ; Mammary to LAD, Sequencial 1st to  2nd Diagonal, Vein to OM, Vein to Distal RCA.;  Surgeon: [name], MD;  Location: [place];  Service: Open Heart Surgery;  Laterality: N/A;  . CYSTOSCOPY/URETEROSCOPY/HOLMIUM LASER/STENT PLACEMENT Bilateral 05/07/2019   Procedure: LEFT URETEROSCOPY/HOLMIUM LASER/STENT PLACEMENT/BIOPSY LEFT MID URETER/RIGHT RETROGRADE STENT PLACEMENT ;  Surgeon: [name], MD;  [facility];  Service: Urology;  Laterality: Bilateral;  . CYSTOSCOPY/URETEROSCOPY/HOLMIUM LASER/STENT PLACEMENT Bilateral 05/27/2019   Procedure: CYSTOSCOPY, URETEROSCOPY/ RETROGRADE PYELOGRAM/ HOLMIUM LASER/STENT EXCHANGE;  Surgeon: [name], MD;  [facility];  Service: Urology;  Laterality: Bilateral;  . LYMPH NODE BIOPSY Left 04/02/2016   Procedure: INCIDENTAL LEFT MAMMARY LYMPH NODE BIOPSY;  Surgeon: [name], MD;  Location: [place];  Service: Open Heart Surgery;  Laterality: Left;  . mass abdomen  1955  . NEPHROLITHOTOMY Left 04/24/2019   Procedure: NEPHROLITHOTOMY PERCUTANEOUS WITH SURGEON ACCESS;  Surgeon: [name], MD;  [facility];  Service: Urology;  Laterality: Left;  . TEE WITHOUT CARDIOVERSION N/A 04/02/2016   Procedure: TRANSESOPHAGEAL ECHOCARDIOGRAM (TEE);  Surgeon: [name], MD;  Location: [place];  Service: Open Heart Surgery;  Laterality: N/A;     Current Meds  Medication Sig  . aspirin EC 81 MG tablet Take 81 mg by mouth daily.  Marland Kitchen atorvastatin (LIPITOR) 80 MG tablet Take 80 mg by mouth daily at 6 PM.   . carvedilol (COREG) 25 MG tablet Take 1 tablet (25 mg total) by mouth 2 (two) times daily.  . Cholecalciferol (VITAMIN D3) 2000 units TABS Take 2,000 Units by mouth daily.  . felodipine (PLENDIL) 10 MG 24 hr tablet TAKE 1 TABLET BY MOUTH EVERY DAY (Patient taking differently: Take 10 mg by mouth daily. )  . hydrochlorothiazide (HYDRODIURIL) 12.5 MG tablet Take 12.5 mg by mouth 2 (two) times daily.  . metFORMIN (GLUCOPHAGE-XR) 500 MG 24 hr tablet TAKE 1  TABLET BY MOUTH EVERY DAY (Patient taking differently: Take 500 mg by mouth daily. )  . OVER THE COUNTER MEDICATION Take 2 capsules by mouth 2 (two) times daily. OmegaXL  . SYMBICORT 160-4.5 MCG/ACT inhaler TAKE 2 PUFFS BY MOUTH TWICE A DAY (Patient taking differently: Inhale 2 puffs into the lungs 2 (two) times daily. )  . testosterone cypionate (DEPOTESTOSTERONE CYPIONATE) 200 MG/ML injection INJECT 1 ML INTRAMUSCULARLY EVERY 14 DAYS  . [DISCONTINUED] metoprolol succinate (TOPROL-XL) 50 MG 24 hr tablet Take 50 mg by mouth daily. Take with or immediately following a meal.     Allergies:   Allopurinol, Valsartan-hydrochlorothiazide, and Sulfa antibiotics   Social History   Tobacco Use  . Smoking status: Former Smoker    Packs/day: 1.00    Years: 47.00    Pack years: 47.00    Types: Cigarettes    Quit date: 06/25/2018    Years since quitting: 0.9  . Smokeless tobacco: Never Used  Substance Use Topics  . Alcohol use: Yes    Alcohol/week: 4.0 standard drinks    Types: 4 Cans of beer per week  . Drug use: Yes    Frequency: 7.0 times per week    Types: Marijuana    Comment: marijuana-states none  recently on 05/06/2019     Family Hx: [name] family history includes Colon cancer (age of onset: 16) in his father; Diabetes in his maternal uncle and mother; Heart disease in his maternal aunt; Stroke in his mother. There is no history of Stomach cancer, Esophageal cancer, or Rectal cancer.  ROS:   Please see the history of present illness.     All other systems reviewed and are negative.  Prior CV studies:   The following studies were reviewed today:  Echo 03/30/19: IMPRESSIONS   1. Left ventricular ejection fraction, by visual estimation, is 65 to 70%. The left ventricle has normal function. Normal left ventricular size. There is no left ventricular hypertrophy.  2. Left ventricular diastolic Doppler parameters are consistent with pseudonormalization pattern of LV diastolic  filling.  3. Global right ventricle has normal systolic function.The right ventricular size is normal. No increase in right ventricular wall thickness.  4. Left atrial size was normal.  5. Right atrial size was normal.  6. The mitral valve is normal in structure. No evidence of mitral valve regurgitation. No evidence of mitral stenosis.  7. The tricuspid valve is normal in structure. Tricuspid valve regurgitation was not visualized by color flow Doppler.  8. The aortic valve is normal in structure. Aortic valve regurgitation was not visualized by color flow Doppler. Mild aortic valve sclerosis without stenosis.  9. The pulmonic valve was normal in structure. Pulmonic valve regurgitation is not visualized by color flow Doppler. 10. The inferior vena cava is normal in size with greater than 50% respiratory variability, suggesting right atrial pressure of 3 mmHg.  Lexiscan Myoview 03/27/19:  Nuclear stress EF: 58%.  There was no ST segment deviation noted during stress.  The study is normal.  This is a low risk study.  The left ventricular ejection fraction is normal (55-65%).   Normal stress nuclear study with no ischemia or infarction.  Gated ejection fraction 58% with normal wall motion.  Labs/Other Tests and Data Reviewed:    EKG:  No ECG reviewed.  Recent Labs: 04/21/2019: ALT 22 05/25/2019: BUN 16; Creatinine, Ser 1.28; Hemoglobin 16.2; Platelets 181; Potassium 3.8; Sodium 141   Recent Lipid Panel Lab Results  Component Value Date/Time   CHOL 116 03/30/2019 08:45 AM   TRIG 213 (H) 03/30/2019 08:45 AM   HDL 37 (L) 03/30/2019 08:45 AM   CHOLHDL 3.1 03/30/2019 08:45 AM   CHOLHDL 4.1 03/30/2016 01:56 AM   LDLCALC 45 03/30/2019 08:45 AM    Wt Readings from Last 3 Encounters:  06/23/19 (!) 315 lb (142.9 kg)  05/25/19 (!) 312 lb (141.5 kg)  05/07/19 (!) 310 lb 6.4 oz (140.8 kg)     Objective:    Vital Signs:  BP (!) 159/82   Pulse 71   Wt (!) 315 lb (142.9 kg)   BMI  46.52 kg/m    VITAL SIGNS:  reviewed GEN:  no acute distress EYES:  sclerae anicteric, EOMI - Extraocular Movements Intact RESPIRATORY:  normal respiratory effort, symmetric expansion CARDIOVASCULAR:  no peripheral edema SKIN:  no rash, lesions or ulcers. MUSCULOSKELETAL:  no obvious deformities. NEURO:  alert and oriented x 3, no obvious focal deficit PSYCH:  normal affect  ASSESSMENT & PLAN:    # CAD s/p CABG:  # Hyperlipidemia:   # Shortness of breath: Symptoms have improved.  Echo and Lexiscan Myoview were unremarkable 03/2019.  Continue aspirin, atorvastatin, carvedilol.  Increase exercise to 150 minutes per week.  If triglycerides remain elevated, we  will need to add Vascepa at follow up.  # Hypertension:  Blood pressure is above goal. We discussed starting back his exercise routine.  He is taking both metoprolol and carvedilol. Stop metoprolol.  Continue felodipine, HCTZ and add spironolactone 25mg  daily.  Check BMP in a week.   # Post-op atrial fibrillation: No recurrence since stopping amiodarone.  Continue carvedilol.  # OSA: Encouraged continued use of CPAP.  # Morbid obesity: We discussed following a Mediterranean diet.  We also talked about reducing his snacks and portions.  150 minutes of exercise weekly.  COVID-19 Education: The signs and symptoms of COVID-19 were discussed with the patient and how to seek care for testing (follow up with PCP or arrange E-visit).  The importance of social distancing was discussed today.  Time:   Today, I have spent 21 minutes with the patient with telehealth technology discussing the above problems.     Medication Adjustments/Labs and Tests Ordered: Current medicines are reviewed at length with the patient today.  Concerns regarding medicines are outlined above.   Tests Ordered: Orders Placed This Encounter  Procedures  . Basic metabolic panel    Medication Changes: Meds ordered this encounter  Medications  .  spironolactone (ALDACTONE) 25 MG tablet    Sig: Take 1 tablet (25 mg total) by mouth daily.    Dispense:  90 tablet    Refill:  3    Follow Up:  Either In Person or Virtual in 1 month(s)  Signed, David Latch, MD  06/23/2019 10:26 AM    [place]

## 2019-06-23 NOTE — Telephone Encounter (Signed)
Left message for patient to call and schedule 1 month follow up appt with Dr. Oval Linsey per 06/23/19 staff message

## 2019-07-07 DIAGNOSIS — Z79899 Other long term (current) drug therapy: Secondary | ICD-10-CM | POA: Diagnosis not present

## 2019-07-07 DIAGNOSIS — N202 Calculus of kidney with calculus of ureter: Secondary | ICD-10-CM | POA: Diagnosis not present

## 2019-07-08 LAB — BASIC METABOLIC PANEL
BUN/Creatinine Ratio: 11 (ref 10–24)
BUN: 14 mg/dL (ref 8–27)
CO2: 22 mmol/L (ref 20–29)
Calcium: 10 mg/dL (ref 8.6–10.2)
Chloride: 97 mmol/L (ref 96–106)
Creatinine, Ser: 1.3 mg/dL — ABNORMAL HIGH (ref 0.76–1.27)
GFR calc Af Amer: 63 mL/min/{1.73_m2} (ref >59)
GFR calc non Af Amer: 55 mL/min/{1.73_m2} — ABNORMAL LOW (ref >59)
Glucose: 110 mg/dL — ABNORMAL HIGH (ref 65–99)
Potassium: 4.2 mmol/L (ref 3.5–5.2)
Sodium: 138 mmol/L (ref 134–144)

## 2019-07-16 DIAGNOSIS — N200 Calculus of kidney: Secondary | ICD-10-CM | POA: Diagnosis not present

## 2019-08-04 ENCOUNTER — Ambulatory Visit: Payer: Medicare PPO | Admitting: Cardiovascular Disease

## 2019-08-04 ENCOUNTER — Other Ambulatory Visit: Payer: Self-pay

## 2019-08-04 ENCOUNTER — Encounter: Payer: Self-pay | Admitting: Cardiovascular Disease

## 2019-08-04 VITALS — BP 142/87 | HR 83 | Temp 97.9°F | Ht 69.0 in | Wt 313.2 lb

## 2019-08-04 DIAGNOSIS — G4733 Obstructive sleep apnea (adult) (pediatric): Secondary | ICD-10-CM

## 2019-08-04 DIAGNOSIS — I10 Essential (primary) hypertension: Secondary | ICD-10-CM | POA: Diagnosis not present

## 2019-08-04 DIAGNOSIS — Z951 Presence of aortocoronary bypass graft: Secondary | ICD-10-CM | POA: Diagnosis not present

## 2019-08-04 DIAGNOSIS — E785 Hyperlipidemia, unspecified: Secondary | ICD-10-CM

## 2019-08-04 MED ORDER — HYDRALAZINE HCL 50 MG PO TABS: 50.0000 mg | ORAL_TABLET | Freq: Two times a day (BID) | ORAL | 1 refills | Status: DC

## 2019-08-04 MED ORDER — HYDRALAZINE HCL 50 MG PO TABS: 50.0000 mg | ORAL_TABLET | Freq: Three times a day (TID) | ORAL | 1 refills | Status: DC

## 2019-08-04 NOTE — Progress Notes (Signed)
Cardiology Office Note   Date:  08/21/2019   ID:  David Brandt, DOB Mar 17, 1946, MRN [id number]  PCP:  [name], MD  Cardiologist:   Skeet Latch, MD  [facility] APP: Kerin Ransom, PA-C  No chief complaint on file.    History of Present Illness: David Brandt is a 74 y.o. male with CAD s/p CABG (LIMA-->LAD, SVG-->D1,D2, SVG-->OM, SVG-->RCA) , diabetes, OSA on CPAP, post-operative atrial fibrillation, hypertension,emphysema,and hyperlipidemia who presents for follow. David Brandt initially presented with chest pressure and shortness of breath 03/2016. He underwent Lexiscan Myoviewon 03/28/16 that revealed LVEF 48% with a large inferior and inferior lateral MI and no evidence of ischemia. There was also basal to mid inferior wall akinesis. He subsequently underwent left heart catheterization where he was found to have severe three-vessel CAD. He underwent CABG with Dr. Servando Snare on 04/02/16. He developed post-operative atrial fibrillation and was started on amiodarone. There were were no other postoperative complications and he was discharged 04/11/16. Echo 03/30/16 revealed LVEF 55-60% and was otherwise unremarkable. Amiodaronewas discontinued 07/2016.   David Brandt has been doing okay.  For the last couple months he has struggled off and on with pickups.  He is trying to start back exercising more.  Since the pandemic started he has not been going to the gym and has noticed a change in his body.  He gets more tired and does not have as much stamina.  He is trying to do the treadmill for a few minutes each day.  He has no exertional chest pain but does get short of breath.  He denies lower extremity edema, orthopnea, or PND.  His blood pressure has been ranging from the 120s to the 150s over 80s to 90s.  He uses his CPAP regularly and finds that this helps him to feel more refreshed when he wakes up.  He denies daytime somnolence.   Past Medical History:  Diagnosis  Date  . Abnormal cardiovascular stress test 03/29/2016  . Allergic rhinitis   . Allergy   . Arthritis   . Asthma   . Diabetes ([place]) 03/29/2016  . Gout   . History of kidney stones   . Hyperlipidemia   . Hypertension   . Sleep apnea    cpap    Past Surgical History:  Procedure Laterality Date  . CARDIAC CATHETERIZATION N/A 03/29/2016   Procedure: Right/Left Heart Cath and Coronary Angiography;  Surgeon: Nelva Bush, MD;  Location: [place] [facility];  Service: Cardiovascular;  Laterality: N/A;  . CARDIAC CATHETERIZATION N/A 03/29/2016   Procedure: Intravascular Pressure Wire/FFR Study;  Surgeon: Nelva Bush, MD;  Location: [place] [facility];  Service: Cardiovascular;  Laterality: N/A;  . CARDIAC CATHETERIZATION Left 04/02/2016   Procedure: FEMORAL ARTERIAL LINE INSERTION;  Surgeon: [name], MD;  Location: [place];  Service: Open Heart Surgery;  Laterality: Left;  . COLONOSCOPY  02/20/2018   per Dr. Carlean Purl, no polyps, repeat in 5 yrs (previous adenomatous polyps)   . CORONARY ARTERY BYPASS GRAFT N/A 04/02/2016   Procedure: CORONARY ARTERY BYPASS GRAFTING (CABG) X 5 UTILIZING LEFT INTERNAL MAMMARY ARTERY AND ENDOSCOPICALLY HARVESTED GREATER SAPHENOUS VEIN ; Mammary to LAD, Sequencial 1st to 2nd Diagonal, Vein to OM, Vein to Distal RCA.;  Surgeon: [name], MD;  Location: [place];  Service: Open Heart Surgery;  Laterality: N/A;  . CYSTOSCOPY/URETEROSCOPY/HOLMIUM LASER/STENT PLACEMENT Bilateral 05/07/2019   Procedure: LEFT URETEROSCOPY/HOLMIUM LASER/STENT PLACEMENT/BIOPSY LEFT MID URETER/RIGHT RETROGRADE STENT PLACEMENT ;  Surgeon: Louis Meckel  W, MD;  [facility];  Service: Urology;  Laterality: Bilateral;  . CYSTOSCOPY/URETEROSCOPY/HOLMIUM LASER/STENT PLACEMENT Bilateral 05/27/2019   Procedure: CYSTOSCOPY, URETEROSCOPY/ RETROGRADE PYELOGRAM/ HOLMIUM LASER/STENT EXCHANGE;  Surgeon: [name], MD;  [facility];  Service: Urology;  Laterality:  Bilateral;  . LYMPH NODE BIOPSY Left 04/02/2016   Procedure: INCIDENTAL LEFT MAMMARY LYMPH NODE BIOPSY;  Surgeon: [name], MD;  Location: [place];  Service: Open Heart Surgery;  Laterality: Left;  . mass abdomen  1955  . NEPHROLITHOTOMY Left 04/24/2019   Procedure: NEPHROLITHOTOMY PERCUTANEOUS WITH SURGEON ACCESS;  Surgeon: [name], MD;  [facility];  Service: Urology;  Laterality: Left;  . TEE WITHOUT CARDIOVERSION N/A 04/02/2016   Procedure: TRANSESOPHAGEAL ECHOCARDIOGRAM (TEE);  Surgeon: [name], MD;  Location: [place];  Service: Open Heart Surgery;  Laterality: N/A;     Current Outpatient Medications  Medication Sig Dispense Refill  . aspirin EC 81 MG tablet Take 81 mg by mouth daily.    Marland Kitchen atorvastatin (LIPITOR) 80 MG tablet Take 80 mg by mouth daily at 6 PM.     . Cholecalciferol (VITAMIN D3) 2000 units TABS Take 2,000 Units by mouth daily.    . felodipine (PLENDIL) 10 MG 24 hr tablet TAKE 1 TABLET BY MOUTH EVERY DAY 90 tablet 2  . hydrochlorothiazide (HYDRODIURIL) 12.5 MG tablet Take 12.5 mg by mouth 2 (two) times daily.    . metFORMIN (GLUCOPHAGE-XR) 500 MG 24 hr tablet TAKE 1 TABLET BY MOUTH EVERY DAY 90 tablet 3  . OVER THE COUNTER MEDICATION Take 2 capsules by mouth 2 (two) times daily. OmegaXL    . spironolactone (ALDACTONE) 25 MG tablet Take 1 tablet (25 mg total) by mouth daily. 90 tablet 3  . SYMBICORT 160-4.5 MCG/ACT inhaler TAKE 2 PUFFS BY MOUTH TWICE A DAY 30.6 Inhaler 3  . testosterone cypionate (DEPOTESTOSTERONE CYPIONATE) 200 MG/ML injection INJECT 1 ML INTRAMUSCULARLY EVERY 14 DAYS 6 mL 1  . carvedilol (COREG) 25 MG tablet Take 1 tablet (25 mg total) by mouth 2 (two) times daily. 180 tablet 3  . hydrALAZINE (APRESOLINE) 50 MG tablet Take 1 tablet (50 mg total) by mouth 2 (two) times daily. 180 tablet 1   No current facility-administered medications for this visit.    Allergies:   Allopurinol, Valsartan-hydrochlorothiazide, and Sulfa  antibiotics    Social History:  The patient  reports that he quit smoking about 13 months ago. His smoking use included cigarettes. He has a 47.00 pack-year smoking history. He has never used smokeless tobacco. He reports current alcohol use of about 4.0 standard drinks of alcohol per week. He reports current drug use. Frequency: 7.00 times per week. Drug: Marijuana.   Family History:  [name] family history includes Colon cancer (age of onset: 64) in his father; Diabetes in his maternal uncle and mother; Heart disease in his maternal aunt; Stroke in his mother.    ROS:  Please see the history of present illness.   Otherwise, review of systems are positive for none.   All other systems are reviewed and negative.    PHYSICAL EXAM: VS:  BP (!) 142/87   Pulse 83   Temp 97.9 F (36.6 C)   Ht 5\' 9"  (1.753 m)   Wt (!) 313 lb 3.2 oz (142.1 kg)   SpO2 97%   BMI 46.25 kg/m  , BMI Body mass index is 46.25 kg/m. GENERAL:  Well appearing HEENT: Pupils equal round and reactive, fundi not visualized,  oral mucosa unremarkable NECK:  No jugular venous distention, waveform within normal limits, carotid upstroke brisk and symmetric, no bruits LUNGS:  Clear to auscultation bilaterally HEART:  RRR.  PMI not displaced or sustained,S1 and S2 within normal limits, no S3, no S4, no clicks, no rubs, no murmurs ABD:  Flat, positive bowel sounds normal in frequency in pitch, no bruits, no rebound, no guarding, no midline pulsatile mass, no hepatomegaly, no splenomegaly EXT:  2 plus pulses throughout, no edema, no cyanosis no clubbing SKIN:  No rashes no nodules NEURO:  Cranial nerves II through XII grossly intact, motor grossly intact throughout PSYCH:  Cognitively intact, oriented to person place and time   EKG:  EKG is not ordered today. The ekg ordered 05/02/16 demonstrates sinus rhythm. Rate 65 bpm.  12/09/17: Sinus rhythm.  Rate 68 bpm.  Prior septal infarct.  Inferolateral TWI 03/23/19: Sinus  rhythm.  Rate 75 bpm.  Lexiscan Myoview 03/29/16: Nuclear stress EF: 48%.  The left ventricular ejection fraction is mildly decreased (45-54%).   Large inferior and inferior lateral wall MI from apex to base with no ischemia EF 48% with mid and basal inferior wall akinesis   LHC 03/29/16: Conclusions: 1. Severe 3 vessel coronary artery disease, as detailed below, including 50% distal LMCA/ostial LAD stenoses (FFR 0.70), 80% D1 lesion, 80% ostial LCx stenosis, and diffusely diseased RCA with 99% mid RCA stenosis with TIMI-1 flow and competitive filling of the PDA and rPL branches by left-to-right collaterals. 2. Mildly decreased LV systolic function with inferior akinesis (LVEF 45-50%) 3. Upper normal to mildly elevated left heart filling pressures. 4. Reduced Fick cardiac output.   Echo  03/30/16:  Study Conclusions  - Left ventricle: The cavity size was normal. Wall thickness was   increased in a pattern of moderate LVH. Systolic function was   normal. The estimated ejection fraction was in the range of 55%   to 60%. Left ventricular diastolic function parameters were   normal. - Left atrium: The atrium was mildly dilated. - Atrial septum: No defect or patent foramen ovale was identified. - Pulmonary arteries: PA peak pressure: 34 mm Hg (S).  Recent Labs: 04/21/2019: ALT 22 05/25/2019: Hemoglobin 16.2; Platelets 181 07/07/2019: BUN 14; Creatinine, Ser 1.30; Potassium 4.2; Sodium 138    Lipid Panel    Component Value Date/Time   CHOL 116 03/30/2019 0845   TRIG 213 (H) 03/30/2019 0845   HDL 37 (L) 03/30/2019 0845   CHOLHDL 3.1 03/30/2019 0845   CHOLHDL 4.1 03/30/2016 0156   VLDL 59 (H) 03/30/2016 0156   LDLCALC 45 03/30/2019 0845      Wt Readings from Last 3 Encounters:  08/04/19 (!) 313 lb 3.2 oz (142.1 kg)  06/23/19 (!) 315 lb (142.9 kg)  05/25/19 (!) 312 lb (141.5 kg)      ASSESSMENT AND PLAN:  # CAD s/p CABG:  # Hyperlipidemia: # Shortness of  breath: Symptoms have improved.  Echo and Lexiscan Myoview were unremarkable 03/2019.  Continue aspirin, atorvastatin, carvedilol.    Continue to encourage him to exercise.  If triglycerides remain elevated, we will need to add Vascepa at follow up.    # Hypertension: Blood pressure remains above goal.  We will add hydralazine 50 mg twice daily.  Continue felodipine, hydrochlorothiazide, spironolactone, and carvedilol.  # Post-op atrial fibrillation: No recurrence since stopping amiodarone. Continue carvedilol.  # OSA: Encouraged continued use of CPAP.  He is doing well with it.  # Morbid obesity: We discussed following a  Mediterranean diet. We also talked about reducing his snacks and portions. 150 minutes of exercise weekly. Current medicines are reviewed at length with the patient today.  The patient does not have concerns regarding medicines.  The following changes have been made:  no change  Labs/ tests ordered today include:   No orders of the defined types were placed in this encounter.    Disposition:   FU with Katee Wentland C. Oval Linsey, MD, [facility] in 2 months.     Signed, Mikea Quadros C. Oval Linsey, MD, [facility]  08/21/2019 4:32 PM    [facility]

## 2019-08-04 NOTE — Patient Instructions (Addendum)
Medication Instructions:  START HYDRALAZINE TO 50 MG TWICE A DAY  *If you need a refill on your cardiac medications before your next appointment, please call your pharmacy*  Lab Work: NONE   Testing/Procedures: NONE  Follow-Up: At [facility], you and your health needs are our priority.  As part of our continuing mission to provide you with exceptional heart care, we have created designated Provider Care Teams.  These Care Teams include your primary Cardiologist (physician) and Advanced Practice Providers (APPs -  Physician Assistants and Nurse Practitioners) who all work together to provide you with the care you need, when you need it.  Your next appointment:   2 month(s)  The format for your next appointment:   In Person  Provider:   You may see Skeet Latch, MD or one of the following Advanced Practice Providers on your designated Care Team:    Kerin Ransom, PA-C  [place], [place]  Coletta Memos, [place]   Other Instructions INCREASE YOUR EXERCISE

## 2019-08-06 DIAGNOSIS — Z20828 Contact with and (suspected) exposure to other viral communicable diseases: Secondary | ICD-10-CM | POA: Diagnosis not present

## 2019-08-11 DIAGNOSIS — G4733 Obstructive sleep apnea (adult) (pediatric): Secondary | ICD-10-CM | POA: Diagnosis not present

## 2019-08-11 DIAGNOSIS — G4731 Primary central sleep apnea: Secondary | ICD-10-CM | POA: Diagnosis not present

## 2019-08-14 ENCOUNTER — Telehealth: Payer: Self-pay | Admitting: Cardiovascular Disease

## 2019-08-14 NOTE — Telephone Encounter (Signed)
Per call from [place] with [facility] stated they need the last office noteS for this  pt faxed to them for Insurance purposes.  [employer] needs this for the CPAP machine.   Resent notes stating using and is benefiting from CPAP therapy.    FAX # [phone]     PHONE  [phone]

## 2019-08-14 NOTE — Telephone Encounter (Signed)
Spoke with [employer] and was told Doreene Burke PA sent over Rx 12/10, will forward to Erasmo Score CMA to verify

## 2019-08-18 ENCOUNTER — Telehealth: Payer: Self-pay | Admitting: Cardiology

## 2019-08-18 NOTE — Telephone Encounter (Signed)
Forward information to  Sleep  coordinator

## 2019-08-18 NOTE — Telephone Encounter (Signed)
RJ from [place] calling requesting the patient's most recent office notes indicating the patient is using and benefiting from his CPAP. He states the prescription they have was for 05/09/2019.

## 2019-08-20 NOTE — Telephone Encounter (Signed)
David Brandt is following up on recent office notes.

## 2019-08-21 ENCOUNTER — Encounter: Payer: Self-pay | Admitting: Cardiovascular Disease

## 2019-08-24 ENCOUNTER — Other Ambulatory Visit: Payer: Self-pay

## 2019-08-25 ENCOUNTER — Other Ambulatory Visit: Payer: Self-pay

## 2019-08-25 MED ORDER — CARVEDILOL 25 MG PO TABS: 25.0000 mg | ORAL_TABLET | Freq: Two times a day (BID) | ORAL | 3 refills | Status: DC

## 2019-08-25 MED ORDER — FELODIPINE ER 10 MG PO TB24: 10.0000 mg | ORAL_TABLET | Freq: Every day | ORAL | 2 refills | Status: DC

## 2019-08-25 MED ORDER — ATORVASTATIN CALCIUM 80 MG PO TABS: 80.0000 mg | ORAL_TABLET | Freq: Every day | ORAL | 3 refills | Status: DC

## 2019-08-28 ENCOUNTER — Other Ambulatory Visit: Payer: Self-pay

## 2019-09-08 DIAGNOSIS — N200 Calculus of kidney: Secondary | ICD-10-CM | POA: Diagnosis not present

## 2019-09-17 DIAGNOSIS — M545 Low back pain: Secondary | ICD-10-CM | POA: Diagnosis not present

## 2019-09-17 DIAGNOSIS — M4726 Other spondylosis with radiculopathy, lumbar region: Secondary | ICD-10-CM | POA: Diagnosis not present

## 2019-09-23 NOTE — Telephone Encounter (Signed)
Spoke with [employer]. She informs me face to face compliance notes has been received.

## 2019-09-29 DIAGNOSIS — M4726 Other spondylosis with radiculopathy, lumbar region: Secondary | ICD-10-CM | POA: Diagnosis not present

## 2019-10-01 DIAGNOSIS — M4726 Other spondylosis with radiculopathy, lumbar region: Secondary | ICD-10-CM | POA: Diagnosis not present

## 2019-10-01 NOTE — Progress Notes (Signed)
Cardiology Office Note   Date:  10/19/2019   ID:  David Brandt, DOB 1945/08/16, MRN [id number]  PCP:  [name], MD  Cardiologist:   Skeet Latch, MD  [facility] APP: Kerin Ransom, PA-C  No chief complaint on file.    History of Present Illness: David Brandt is a 74 y.o. male with CAD s/p CABG (LIMA-->LAD, SVG-->D1,D2, SVG-->OM, SVG-->RCA) , diabetes, OSA on CPAP, post-operative atrial fibrillation, hypertension,emphysema,and hyperlipidemia who presents for follow. David Brandt initially presented with chest pressure and shortness of breath 03/2016. He underwent Lexiscan Myoviewon 03/28/16 that revealed LVEF 48% with a large inferior and inferior lateral MI and no evidence of ischemia. There was also basal to mid inferior wall akinesis. He subsequently underwent left heart catheterization where he was found to have severe three-vessel CAD. He underwent CABG with Dr. Servando Snare on 04/02/16. He developed post-operative atrial fibrillation and was started on amiodarone. There were were no other postoperative complications and he was discharged 04/11/16. Echo 03/30/16 revealed LVEF 55-60% and was otherwise unremarkable. Amiodaronewas discontinued 07/2016.   At his last appointment [name] BP was still elevated so he added hydralazine.  Since then his blood pressure has been much better controlled.  He notices that whenever he gets it checked it is always at target.  He has been doing physical therapy for his back.  He feels good with exercise.  He has no chest pain and his breathing is stable.  He does get short of breath when he has to do a lot of walking.  He uses his inhaler that seems to help.  He continues to use his CPAP regularly.  He has been struggling with sleep and thinks that this is why he is gaining weight.   Past Medical History:  Diagnosis Date  . Abnormal cardiovascular stress test 03/29/2016  . Allergic rhinitis   . Allergy   . Arthritis   .  Asthma   . Diabetes ([place]) 03/29/2016  . Gout   . History of kidney stones   . Hyperlipidemia   . Hypertension   . Sleep apnea    cpap    Past Surgical History:  Procedure Laterality Date  . CARDIAC CATHETERIZATION N/A 03/29/2016   Procedure: Right/Left Heart Cath and Coronary Angiography;  Surgeon: Nelva Bush, MD;  Location: [facility];  Service: Cardiovascular;  Laterality: N/A;  . CARDIAC CATHETERIZATION N/A 03/29/2016   Procedure: Intravascular Pressure Wire/FFR Study;  Surgeon: Nelva Bush, MD;  Location: [facility];  Service: Cardiovascular;  Laterality: N/A;  . CARDIAC CATHETERIZATION Left 04/02/2016   Procedure: FEMORAL ARTERIAL LINE INSERTION;  Surgeon: [name], MD;  Location: [facility];  Service: Open Heart Surgery;  Laterality: Left;  . COLONOSCOPY  02/20/2018   per Dr. Carlean Purl, no polyps, repeat in 5 yrs (previous adenomatous polyps)   . CORONARY ARTERY BYPASS GRAFT N/A 04/02/2016   Procedure: CORONARY ARTERY BYPASS GRAFTING (CABG) X 5 UTILIZING LEFT INTERNAL MAMMARY ARTERY AND ENDOSCOPICALLY HARVESTED GREATER SAPHENOUS VEIN ; Mammary to LAD, Sequencial 1st to 2nd Diagonal, Vein to OM, Vein to Distal RCA.;  Surgeon: [name], MD;  Location: [facility];  Service: Open Heart Surgery;  Laterality: N/A;  . CYSTOSCOPY/URETEROSCOPY/HOLMIUM LASER/STENT PLACEMENT Bilateral 05/07/2019   Procedure: LEFT URETEROSCOPY/HOLMIUM LASER/STENT PLACEMENT/BIOPSY LEFT MID URETER/RIGHT RETROGRADE STENT PLACEMENT ;  Surgeon: [name], MD;  Location: [facility];  Service: Urology;  Laterality: Bilateral;  . CYSTOSCOPY/URETEROSCOPY/HOLMIUM LASER/STENT PLACEMENT Bilateral 05/27/2019   Procedure: CYSTOSCOPY, URETEROSCOPY/  RETROGRADE PYELOGRAM/ HOLMIUM LASER/STENT EXCHANGE;  Surgeon: [name], MD;  Location: [facility];  Service: Urology;  Laterality: Bilateral;  . LYMPH NODE BIOPSY Left 04/02/2016   Procedure: INCIDENTAL LEFT MAMMARY LYMPH NODE BIOPSY;  Surgeon:  [name], MD;  Location: [facility];  Service: Open Heart Surgery;  Laterality: Left;  . mass abdomen  1955  . NEPHROLITHOTOMY Left 04/24/2019   Procedure: NEPHROLITHOTOMY PERCUTANEOUS WITH SURGEON ACCESS;  Surgeon: [name], MD;  Location: [facility];  Service: Urology;  Laterality: Left;  . TEE WITHOUT CARDIOVERSION N/A 04/02/2016   Procedure: TRANSESOPHAGEAL ECHOCARDIOGRAM (TEE);  Surgeon: [name], MD;  Location: [facility];  Service: Open Heart Surgery;  Laterality: N/A;     Current Outpatient Medications  Medication Sig Dispense Refill  . aspirin EC 81 MG tablet Take 81 mg by mouth daily.    Marland Kitchen atorvastatin (LIPITOR) 80 MG tablet Take 1 tablet (80 mg total) by mouth daily at 6 PM. 180 tablet 3  . carvedilol (COREG) 25 MG tablet Take 1 tablet (25 mg total) by mouth 2 (two) times daily. 180 tablet 3  . Cholecalciferol (VITAMIN D3) 2000 units TABS Take 2,000 Units by mouth daily.    . felodipine (PLENDIL) 10 MG 24 hr tablet Take 1 tablet (10 mg total) by mouth daily. 90 tablet 2  . hydrALAZINE (APRESOLINE) 50 MG tablet Take 1 tablet (50 mg total) by mouth 2 (two) times daily. 180 tablet 1  . hydrochlorothiazide (HYDRODIURIL) 12.5 MG tablet Take 12.5 mg by mouth 2 (two) times daily.    . metFORMIN (GLUCOPHAGE-XR) 500 MG 24 hr tablet TAKE 1 TABLET BY MOUTH EVERY DAY 90 tablet 3  . OVER THE COUNTER MEDICATION Take 2 capsules by mouth 2 (two) times daily. OmegaXL    . SYMBICORT 160-4.5 MCG/ACT inhaler TAKE 2 PUFFS BY MOUTH TWICE A DAY 30.6 Inhaler 3  . testosterone cypionate (DEPOTESTOSTERONE CYPIONATE) 200 MG/ML injection INJECT 1 ML INTRAMUSCULARLY EVERY 14 DAYS 6 mL 1  . spironolactone (ALDACTONE) 25 MG tablet Take 1 tablet (25 mg total) by mouth daily. 90 tablet 3   No current facility-administered medications for this visit.    Allergies:   Allopurinol, Valsartan-hydrochlorothiazide, and Sulfa antibiotics    Social History:  The patient  reports that he quit smoking  about 15 months ago. His smoking use included cigarettes. He has a 47.00 pack-year smoking history. He has never used smokeless tobacco. He reports current alcohol use of about 4.0 standard drinks of alcohol per week. He reports current drug use. Frequency: 7.00 times per week. Drug: Marijuana.   Family History:  [name] family history includes Colon cancer (age of onset: 28) in his father; Diabetes in his maternal uncle and mother; Heart disease in his maternal aunt; Stroke in his mother.    ROS:  Please see the history of present illness.   Otherwise, review of systems are positive for none.   All other systems are reviewed and negative.    PHYSICAL EXAM: VS:  BP 124/62   Pulse 63   Ht 5\' 9"  (1.753 m)   Wt (!) 318 lb (144.2 kg)   SpO2 93%   BMI 46.96 kg/m  , BMI Body mass index is 46.96 kg/m. GENERAL:  Well appearing HEENT: Pupils equal round and reactive, fundi not visualized, oral mucosa unremarkable NECK:  No jugular venous distention, waveform within normal limits, carotid upstroke brisk and symmetric, no bruits LUNGS:  Clear to auscultation bilaterally HEART:  RRR.  PMI not displaced or sustained,S1 and S2 within normal limits, no S3, no S4, no clicks, no rubs, no murmurs ABD:  Flat, positive bowel sounds normal in frequency in pitch, no bruits, no rebound, no guarding, no midline pulsatile mass, no hepatomegaly, no splenomegaly EXT:  2 plus pulses throughout, no edema, no cyanosis no clubbing SKIN:  No rashes no nodules NEURO:  Cranial nerves II through XII grossly intact, motor grossly intact throughout PSYCH:  Cognitively intact, oriented to person place and tim  EKG:  EKG is ordered today. The ekg ordered 05/02/16 demonstrates sinus rhythm. Rate 65 bpm.  12/09/17: Sinus rhythm.  Rate 68 bpm.  Prior septal infarct.  Inferolateral TWI 03/23/19: Sinus rhythm.  Rate 75 bpm. 10/02/2019: Sinus rhythm.  Rate 63 bpm.  Left axis deviation.  Low voltage.  Prior septal infarct.   Lateral T wave inversions.  Syncope in 3 months and follow-up  Lexiscan Myoview 03/29/16: Nuclear stress EF: 48%.  The left ventricular ejection fraction is mildly decreased (45-54%).   Large inferior and inferior lateral wall MI from apex to base with no ischemia EF 48% with mid and basal inferior wall akinesis   LHC 03/29/16: Conclusions: 1. Severe 3 vessel coronary artery disease, as detailed below, including 50% distal LMCA/ostial LAD stenoses (FFR 0.70), 80% D1 lesion, 80% ostial LCx stenosis, and diffusely diseased RCA with 99% mid RCA stenosis with TIMI-1 flow and competitive filling of the PDA and rPL branches by left-to-right collaterals. 2. Mildly decreased LV systolic function with inferior akinesis (LVEF 45-50%) 3. Upper normal to mildly elevated left heart filling pressures. 4. Reduced Fick cardiac output.   Echo  03/30/16:  Study Conclusions  - Left ventricle: The cavity size was normal. Wall thickness was   increased in a pattern of moderate LVH. Systolic function was   normal. The estimated ejection fraction was in the range of 55%   to 60%. Left ventricular diastolic function parameters were   normal. - Left atrium: The atrium was mildly dilated. - Atrial septum: No defect or patent foramen ovale was identified. - Pulmonary arteries: PA peak pressure: 34 mm Hg (S).  Recent Labs: 04/21/2019: ALT 22 05/25/2019: Hemoglobin 16.2; Platelets 181 07/07/2019: BUN 14; Creatinine, Ser 1.30; Potassium 4.2; Sodium 138    Lipid Panel    Component Value Date/Time   CHOL 116 03/30/2019 0845   TRIG 213 (H) 03/30/2019 0845   HDL 37 (L) 03/30/2019 0845   CHOLHDL 3.1 03/30/2019 0845   CHOLHDL 4.1 03/30/2016 0156   VLDL 59 (H) 03/30/2016 0156   LDLCALC 45 03/30/2019 0845      Wt Readings from Last 3 Encounters:  10/02/19 (!) 318 lb (144.2 kg)  08/04/19 (!) 313 lb 3.2 oz (142.1 kg)  06/23/19 (!) 315 lb (142.9 kg)      ASSESSMENT AND PLAN:  # CAD s/p CABG:  #  Hyperlipidemia: # Shortness of breath: Symptoms have improved.  Echo and Lexiscan Myoview were unremarkable 03/2019.  Continue aspirin, atorvastatin, carvedilol.    Continue to encourage him to exercise. He will focus on limiting carbs.  Check fasting lipids and CMP in 3 months.  Consider Vascepa if triglycerides remain elevated.  # Hypertension: Blood pressure improved since adding hydralazine.  Continue felodipine, hydrochlorothiazide, spironolactone, and carvedilol.  # Post-op atrial fibrillation: No recurrence since stopping amiodarone. Continue carvedilol.  # OSA: Encouraged continued use of CPAP.  He is doing well with it.  # Morbid obesity: We discussed following a Mediterranean diet. We also  talked about reducing his snacks and portions. 150 minutes of exercise weekly.   He will focus on limiting carbs.   Current medicines are reviewed at length with the patient today.  The patient does not have concerns regarding medicines.  The following changes have been made:  no change  Labs/ tests ordered today include:   Orders Placed This Encounter  Procedures  . Lipid panel  . Comprehensive metabolic panel  . EKG 12-Lead     Disposition:   FU with Alee Katen C. Oval Linsey, MD, [facility] in 6 months.     Signed, Kimbria Camposano C. Oval Linsey, MD, [facility]  10/19/2019 1:57 PM    [facility]

## 2019-10-02 ENCOUNTER — Encounter: Payer: Self-pay | Admitting: Cardiovascular Disease

## 2019-10-02 ENCOUNTER — Other Ambulatory Visit: Payer: Self-pay

## 2019-10-02 ENCOUNTER — Ambulatory Visit: Payer: Medicare PPO | Admitting: Cardiovascular Disease

## 2019-10-02 VITALS — BP 124/62 | HR 63 | Ht 69.0 in | Wt 318.0 lb

## 2019-10-02 DIAGNOSIS — G4733 Obstructive sleep apnea (adult) (pediatric): Secondary | ICD-10-CM

## 2019-10-02 DIAGNOSIS — E7800 Pure hypercholesterolemia, unspecified: Secondary | ICD-10-CM

## 2019-10-02 DIAGNOSIS — J449 Chronic obstructive pulmonary disease, unspecified: Secondary | ICD-10-CM | POA: Diagnosis not present

## 2019-10-02 DIAGNOSIS — E785 Hyperlipidemia, unspecified: Secondary | ICD-10-CM | POA: Diagnosis not present

## 2019-10-02 DIAGNOSIS — Z951 Presence of aortocoronary bypass graft: Secondary | ICD-10-CM | POA: Diagnosis not present

## 2019-10-02 DIAGNOSIS — I10 Essential (primary) hypertension: Secondary | ICD-10-CM

## 2019-10-02 DIAGNOSIS — Z5181 Encounter for therapeutic drug level monitoring: Secondary | ICD-10-CM

## 2019-10-02 NOTE — Patient Instructions (Signed)
Medication Instructions:  Your physician recommends that you continue on your current medications as directed. Please refer to the Current Medication list given to you today.  *If you need a refill on your cardiac medications before your next appointment, please call your pharmacy*  Lab Work: FASTING LP/CMET IN 3 MONTHS  If you have labs (blood work) drawn today and your tests are completely normal, you will receive your results only by: Marland Kitchen MyChart Message (if you have MyChart) OR . A paper copy in the mail If you have any lab test that is abnormal or we need to change your treatment, we will call you to review the results.  Testing/Procedures: NONE   Follow-Up: At [facility], you and your health needs are our priority.  As part of our continuing mission to provide you with exceptional heart care, we have created designated Provider Care Teams.  These Care Teams include your primary Cardiologist (physician) and Advanced Practice Providers (APPs -  Physician Assistants and Nurse Practitioners) who all work together to provide you with the care you need, when you need it.  We recommend signing up for the patient portal called "MyChart".  Sign up information is provided on this After Visit Summary.  MyChart is used to connect with patients for Virtual Visits (Telemedicine).  Patients are able to view lab/test results, encounter notes, upcoming appointments, etc.  Non-urgent messages can be sent to your provider as well.   To learn more about what you can do with MyChart, go to [email or web address].    Your next appointment:   6 month(s)  The format for your next appointment:   IN PER SON   Provider:   You may see Skeet Latch, MD or one of the following Advanced Practice Providers on your designated Care Team:    Kerin Ransom, PA-C  [place], [place]  Coletta Memos, [place]

## 2019-10-06 DIAGNOSIS — M4726 Other spondylosis with radiculopathy, lumbar region: Secondary | ICD-10-CM | POA: Diagnosis not present

## 2019-10-08 DIAGNOSIS — M4726 Other spondylosis with radiculopathy, lumbar region: Secondary | ICD-10-CM | POA: Diagnosis not present

## 2019-10-13 DIAGNOSIS — M4726 Other spondylosis with radiculopathy, lumbar region: Secondary | ICD-10-CM | POA: Diagnosis not present

## 2019-10-15 DIAGNOSIS — I10 Essential (primary) hypertension: Secondary | ICD-10-CM | POA: Diagnosis not present

## 2019-10-15 DIAGNOSIS — Z6841 Body Mass Index (BMI) 40.0 and over, adult: Secondary | ICD-10-CM | POA: Diagnosis not present

## 2019-10-15 DIAGNOSIS — M545 Low back pain: Secondary | ICD-10-CM | POA: Diagnosis not present

## 2019-10-15 DIAGNOSIS — M4726 Other spondylosis with radiculopathy, lumbar region: Secondary | ICD-10-CM | POA: Diagnosis not present

## 2019-10-16 ENCOUNTER — Other Ambulatory Visit: Payer: Self-pay | Admitting: Family Medicine

## 2019-10-16 DIAGNOSIS — E291 Testicular hypofunction: Secondary | ICD-10-CM

## 2019-10-20 DIAGNOSIS — M4726 Other spondylosis with radiculopathy, lumbar region: Secondary | ICD-10-CM | POA: Diagnosis not present

## 2019-10-20 NOTE — Telephone Encounter (Signed)
Okay for refill?   LOV  and refill 04/29/2019 CPE 86ml with 1 refill

## 2019-10-22 DIAGNOSIS — M4726 Other spondylosis with radiculopathy, lumbar region: Secondary | ICD-10-CM | POA: Diagnosis not present

## 2019-10-27 DIAGNOSIS — M4726 Other spondylosis with radiculopathy, lumbar region: Secondary | ICD-10-CM | POA: Diagnosis not present

## 2019-10-28 ENCOUNTER — Other Ambulatory Visit: Payer: Self-pay | Admitting: Cardiovascular Disease

## 2019-10-29 ENCOUNTER — Encounter: Payer: Self-pay | Admitting: Family Medicine

## 2019-10-29 DIAGNOSIS — M4726 Other spondylosis with radiculopathy, lumbar region: Secondary | ICD-10-CM | POA: Diagnosis not present

## 2019-10-29 DIAGNOSIS — L989 Disorder of the skin and subcutaneous tissue, unspecified: Secondary | ICD-10-CM

## 2019-10-29 DIAGNOSIS — M5116 Intervertebral disc disorders with radiculopathy, lumbar region: Secondary | ICD-10-CM | POA: Diagnosis not present

## 2019-11-03 DIAGNOSIS — M4726 Other spondylosis with radiculopathy, lumbar region: Secondary | ICD-10-CM | POA: Diagnosis not present

## 2019-11-03 NOTE — Telephone Encounter (Signed)
Th referral was done

## 2019-11-05 DIAGNOSIS — M4726 Other spondylosis with radiculopathy, lumbar region: Secondary | ICD-10-CM | POA: Diagnosis not present

## 2019-11-05 DIAGNOSIS — R2689 Other abnormalities of gait and mobility: Secondary | ICD-10-CM | POA: Diagnosis not present

## 2019-11-09 ENCOUNTER — Other Ambulatory Visit: Payer: Self-pay | Admitting: Cardiovascular Disease

## 2019-11-10 DIAGNOSIS — R2689 Other abnormalities of gait and mobility: Secondary | ICD-10-CM | POA: Diagnosis not present

## 2019-11-10 DIAGNOSIS — M4726 Other spondylosis with radiculopathy, lumbar region: Secondary | ICD-10-CM | POA: Diagnosis not present

## 2019-11-10 DIAGNOSIS — G4731 Primary central sleep apnea: Secondary | ICD-10-CM | POA: Diagnosis not present

## 2019-11-10 DIAGNOSIS — G4733 Obstructive sleep apnea (adult) (pediatric): Secondary | ICD-10-CM | POA: Diagnosis not present

## 2019-11-12 DIAGNOSIS — I10 Essential (primary) hypertension: Secondary | ICD-10-CM | POA: Diagnosis not present

## 2019-11-12 DIAGNOSIS — M4726 Other spondylosis with radiculopathy, lumbar region: Secondary | ICD-10-CM | POA: Diagnosis not present

## 2019-11-12 DIAGNOSIS — R2689 Other abnormalities of gait and mobility: Secondary | ICD-10-CM | POA: Diagnosis not present

## 2019-11-12 DIAGNOSIS — M545 Low back pain: Secondary | ICD-10-CM | POA: Diagnosis not present

## 2019-11-12 DIAGNOSIS — Z6841 Body Mass Index (BMI) 40.0 and over, adult: Secondary | ICD-10-CM | POA: Diagnosis not present

## 2019-11-17 DIAGNOSIS — M4726 Other spondylosis with radiculopathy, lumbar region: Secondary | ICD-10-CM | POA: Diagnosis not present

## 2019-11-17 DIAGNOSIS — R2689 Other abnormalities of gait and mobility: Secondary | ICD-10-CM | POA: Diagnosis not present

## 2019-11-19 DIAGNOSIS — M4726 Other spondylosis with radiculopathy, lumbar region: Secondary | ICD-10-CM | POA: Diagnosis not present

## 2019-11-19 DIAGNOSIS — R2689 Other abnormalities of gait and mobility: Secondary | ICD-10-CM | POA: Diagnosis not present

## 2019-11-22 ENCOUNTER — Other Ambulatory Visit: Payer: Self-pay | Admitting: Family Medicine

## 2019-11-24 DIAGNOSIS — M48062 Spinal stenosis, lumbar region with neurogenic claudication: Secondary | ICD-10-CM | POA: Diagnosis not present

## 2019-11-24 DIAGNOSIS — M4726 Other spondylosis with radiculopathy, lumbar region: Secondary | ICD-10-CM | POA: Diagnosis not present

## 2019-11-24 DIAGNOSIS — R2689 Other abnormalities of gait and mobility: Secondary | ICD-10-CM | POA: Diagnosis not present

## 2019-11-24 NOTE — Telephone Encounter (Signed)
Patient need to schedule an ov for more refills. Rx sent for 30 day only.

## 2019-12-01 DIAGNOSIS — R2689 Other abnormalities of gait and mobility: Secondary | ICD-10-CM | POA: Diagnosis not present

## 2019-12-01 DIAGNOSIS — M4726 Other spondylosis with radiculopathy, lumbar region: Secondary | ICD-10-CM | POA: Diagnosis not present

## 2019-12-03 DIAGNOSIS — M4726 Other spondylosis with radiculopathy, lumbar region: Secondary | ICD-10-CM | POA: Diagnosis not present

## 2019-12-03 DIAGNOSIS — R2689 Other abnormalities of gait and mobility: Secondary | ICD-10-CM | POA: Diagnosis not present

## 2019-12-08 DIAGNOSIS — R2689 Other abnormalities of gait and mobility: Secondary | ICD-10-CM | POA: Diagnosis not present

## 2019-12-08 DIAGNOSIS — M4726 Other spondylosis with radiculopathy, lumbar region: Secondary | ICD-10-CM | POA: Diagnosis not present

## 2019-12-10 DIAGNOSIS — M48062 Spinal stenosis, lumbar region with neurogenic claudication: Secondary | ICD-10-CM | POA: Diagnosis not present

## 2019-12-10 DIAGNOSIS — R2689 Other abnormalities of gait and mobility: Secondary | ICD-10-CM | POA: Diagnosis not present

## 2019-12-10 DIAGNOSIS — I10 Essential (primary) hypertension: Secondary | ICD-10-CM | POA: Diagnosis not present

## 2019-12-10 DIAGNOSIS — Z6841 Body Mass Index (BMI) 40.0 and over, adult: Secondary | ICD-10-CM | POA: Diagnosis not present

## 2019-12-10 DIAGNOSIS — M4726 Other spondylosis with radiculopathy, lumbar region: Secondary | ICD-10-CM | POA: Diagnosis not present

## 2019-12-11 ENCOUNTER — Other Ambulatory Visit: Payer: Self-pay | Admitting: Family Medicine

## 2019-12-11 NOTE — Telephone Encounter (Signed)
Please advise. Last filled by Dr. Oval Linsey.

## 2019-12-14 DIAGNOSIS — G4731 Primary central sleep apnea: Secondary | ICD-10-CM | POA: Diagnosis not present

## 2019-12-14 DIAGNOSIS — G4733 Obstructive sleep apnea (adult) (pediatric): Secondary | ICD-10-CM | POA: Diagnosis not present

## 2019-12-15 DIAGNOSIS — M4726 Other spondylosis with radiculopathy, lumbar region: Secondary | ICD-10-CM | POA: Diagnosis not present

## 2019-12-15 DIAGNOSIS — R2689 Other abnormalities of gait and mobility: Secondary | ICD-10-CM | POA: Diagnosis not present

## 2019-12-17 DIAGNOSIS — M545 Low back pain: Secondary | ICD-10-CM | POA: Diagnosis not present

## 2019-12-17 DIAGNOSIS — M4726 Other spondylosis with radiculopathy, lumbar region: Secondary | ICD-10-CM | POA: Diagnosis not present

## 2019-12-17 DIAGNOSIS — M479 Spondylosis, unspecified: Secondary | ICD-10-CM | POA: Diagnosis not present

## 2019-12-17 DIAGNOSIS — R2689 Other abnormalities of gait and mobility: Secondary | ICD-10-CM | POA: Diagnosis not present

## 2019-12-22 DIAGNOSIS — R2689 Other abnormalities of gait and mobility: Secondary | ICD-10-CM | POA: Diagnosis not present

## 2019-12-22 DIAGNOSIS — M4726 Other spondylosis with radiculopathy, lumbar region: Secondary | ICD-10-CM | POA: Diagnosis not present

## 2019-12-24 DIAGNOSIS — M4726 Other spondylosis with radiculopathy, lumbar region: Secondary | ICD-10-CM | POA: Diagnosis not present

## 2019-12-24 DIAGNOSIS — R2689 Other abnormalities of gait and mobility: Secondary | ICD-10-CM | POA: Diagnosis not present

## 2019-12-29 DIAGNOSIS — M4726 Other spondylosis with radiculopathy, lumbar region: Secondary | ICD-10-CM | POA: Diagnosis not present

## 2019-12-29 DIAGNOSIS — R2689 Other abnormalities of gait and mobility: Secondary | ICD-10-CM | POA: Diagnosis not present

## 2019-12-31 DIAGNOSIS — R2689 Other abnormalities of gait and mobility: Secondary | ICD-10-CM | POA: Diagnosis not present

## 2019-12-31 DIAGNOSIS — M4726 Other spondylosis with radiculopathy, lumbar region: Secondary | ICD-10-CM | POA: Diagnosis not present

## 2020-01-07 DIAGNOSIS — M4726 Other spondylosis with radiculopathy, lumbar region: Secondary | ICD-10-CM | POA: Diagnosis not present

## 2020-01-07 DIAGNOSIS — R2689 Other abnormalities of gait and mobility: Secondary | ICD-10-CM | POA: Diagnosis not present

## 2020-01-14 DIAGNOSIS — M4726 Other spondylosis with radiculopathy, lumbar region: Secondary | ICD-10-CM | POA: Diagnosis not present

## 2020-01-14 DIAGNOSIS — R2689 Other abnormalities of gait and mobility: Secondary | ICD-10-CM | POA: Diagnosis not present

## 2020-01-17 ENCOUNTER — Other Ambulatory Visit: Payer: Self-pay | Admitting: Family Medicine

## 2020-01-21 DIAGNOSIS — R2689 Other abnormalities of gait and mobility: Secondary | ICD-10-CM | POA: Diagnosis not present

## 2020-01-21 DIAGNOSIS — M4726 Other spondylosis with radiculopathy, lumbar region: Secondary | ICD-10-CM | POA: Diagnosis not present

## 2020-01-25 ENCOUNTER — Ambulatory Visit: Payer: Medicare PPO | Admitting: Physician Assistant

## 2020-01-25 ENCOUNTER — Other Ambulatory Visit: Payer: Self-pay

## 2020-01-25 ENCOUNTER — Encounter: Payer: Self-pay | Admitting: Physician Assistant

## 2020-01-25 DIAGNOSIS — L820 Inflamed seborrheic keratosis: Secondary | ICD-10-CM

## 2020-01-25 DIAGNOSIS — D485 Neoplasm of uncertain behavior of skin: Secondary | ICD-10-CM | POA: Diagnosis not present

## 2020-01-25 DIAGNOSIS — L918 Other hypertrophic disorders of the skin: Secondary | ICD-10-CM | POA: Diagnosis not present

## 2020-01-25 DIAGNOSIS — B078 Other viral warts: Secondary | ICD-10-CM | POA: Diagnosis not present

## 2020-01-25 NOTE — Patient Instructions (Signed)

## 2020-01-25 NOTE — Progress Notes (Signed)
   New Patient Visit  Subjective  David Brandt is a 74 y.o. male who presents for the following: Skin Problem (skin tag on scalp, near eye and all around neck, removed about 15-20 years ago).  Objective  Well appearing patient in no apparent distress; mood and affect are within normal limits.  A focused examination was performed including scalp, face, neck. Relevant physical exam findings are noted in the Assessment and Plan.  Objective  Left Parietal Scalp: Raised warty papule left posterior scalp     Objective  Left Malar Cheek (2), Left Parotid Area, Left Postauricular Area (2): Erythematous stuck-on, waxy papule or plaque.   Objective  Left Anterior Neck (15), Right Anterior Neck (20): Fleshy, skin-colored sessile and pedunculated papules.    Images    Assessment & Plan  Neoplasm of uncertain behavior of skin Left Parietal Scalp  Skin / nail biopsy Type of biopsy: tangential   Informed consent: discussed and consent obtained   Timeout: patient name, date of birth, surgical site, and procedure verified   Procedure prep:  Patient was prepped and draped in usual sterile fashion (Non sterile) Prep type:  Chlorhexidine Anesthesia: the lesion was anesthetized in a standard fashion   Anesthetic:  1% lidocaine w/ epinephrine 1-100,000 local infiltration Instrument used: flexible razor blade   Outcome: patient tolerated procedure well   Post-procedure details: wound care instructions given    Specimen 1 - Surgical pathology Differential Diagnosis: warty papule Check Margins: No  Inflamed seborrheic keratosis (5) Left Parotid Area; Left Malar Cheek (2); Left Postauricular Area (2)  Destruction of lesion - Left Malar Cheek (2), Left Parotid Area, Left Postauricular Area (2) Complexity: simple   Destruction method: cryotherapy   Informed consent: discussed and consent obtained   Timeout:  patient name, date of birth, surgical site, and procedure verified Lesion  destroyed using liquid nitrogen: Yes   Outcome: patient tolerated procedure well with no complications    Skin tag (35) Left Anterior Neck (15); Right Anterior Neck (20)  Epidermal / dermal shaving - Left Anterior Neck, Right Anterior Neck  Informed consent: discussed and consent obtained   Timeout: patient name, date of birth, surgical site, and procedure verified   Instrument used: scissors   Hemostasis achieved with: ferric subsulfate   Outcome: patient tolerated procedure well

## 2020-01-28 DIAGNOSIS — R2689 Other abnormalities of gait and mobility: Secondary | ICD-10-CM | POA: Diagnosis not present

## 2020-01-28 DIAGNOSIS — M4726 Other spondylosis with radiculopathy, lumbar region: Secondary | ICD-10-CM | POA: Diagnosis not present

## 2020-02-02 DIAGNOSIS — N200 Calculus of kidney: Secondary | ICD-10-CM | POA: Diagnosis not present

## 2020-02-04 DIAGNOSIS — R2689 Other abnormalities of gait and mobility: Secondary | ICD-10-CM | POA: Diagnosis not present

## 2020-02-04 DIAGNOSIS — M4726 Other spondylosis with radiculopathy, lumbar region: Secondary | ICD-10-CM | POA: Diagnosis not present

## 2020-02-10 DIAGNOSIS — M4726 Other spondylosis with radiculopathy, lumbar region: Secondary | ICD-10-CM | POA: Diagnosis not present

## 2020-02-10 DIAGNOSIS — R2689 Other abnormalities of gait and mobility: Secondary | ICD-10-CM | POA: Diagnosis not present

## 2020-02-11 DIAGNOSIS — M48062 Spinal stenosis, lumbar region with neurogenic claudication: Secondary | ICD-10-CM | POA: Diagnosis not present

## 2020-02-11 DIAGNOSIS — Z6841 Body Mass Index (BMI) 40.0 and over, adult: Secondary | ICD-10-CM | POA: Diagnosis not present

## 2020-02-11 DIAGNOSIS — M4726 Other spondylosis with radiculopathy, lumbar region: Secondary | ICD-10-CM | POA: Diagnosis not present

## 2020-02-18 DIAGNOSIS — M6281 Muscle weakness (generalized): Secondary | ICD-10-CM | POA: Diagnosis not present

## 2020-02-18 DIAGNOSIS — R2689 Other abnormalities of gait and mobility: Secondary | ICD-10-CM | POA: Diagnosis not present

## 2020-02-18 DIAGNOSIS — M4726 Other spondylosis with radiculopathy, lumbar region: Secondary | ICD-10-CM | POA: Diagnosis not present

## 2020-02-24 ENCOUNTER — Other Ambulatory Visit: Payer: Self-pay | Admitting: Cardiovascular Disease

## 2020-02-25 DIAGNOSIS — M4726 Other spondylosis with radiculopathy, lumbar region: Secondary | ICD-10-CM | POA: Diagnosis not present

## 2020-02-25 DIAGNOSIS — R2689 Other abnormalities of gait and mobility: Secondary | ICD-10-CM | POA: Diagnosis not present

## 2020-03-03 DIAGNOSIS — R2689 Other abnormalities of gait and mobility: Secondary | ICD-10-CM | POA: Diagnosis not present

## 2020-03-03 DIAGNOSIS — M4726 Other spondylosis with radiculopathy, lumbar region: Secondary | ICD-10-CM | POA: Diagnosis not present

## 2020-03-09 ENCOUNTER — Other Ambulatory Visit: Payer: Self-pay | Admitting: Cardiovascular Disease

## 2020-03-10 DIAGNOSIS — M4726 Other spondylosis with radiculopathy, lumbar region: Secondary | ICD-10-CM | POA: Diagnosis not present

## 2020-03-10 DIAGNOSIS — R2689 Other abnormalities of gait and mobility: Secondary | ICD-10-CM | POA: Diagnosis not present

## 2020-03-16 DIAGNOSIS — R2689 Other abnormalities of gait and mobility: Secondary | ICD-10-CM | POA: Diagnosis not present

## 2020-03-16 DIAGNOSIS — M4726 Other spondylosis with radiculopathy, lumbar region: Secondary | ICD-10-CM | POA: Diagnosis not present

## 2020-03-23 DIAGNOSIS — M4726 Other spondylosis with radiculopathy, lumbar region: Secondary | ICD-10-CM | POA: Diagnosis not present

## 2020-03-23 DIAGNOSIS — R2689 Other abnormalities of gait and mobility: Secondary | ICD-10-CM | POA: Diagnosis not present

## 2020-04-06 ENCOUNTER — Other Ambulatory Visit: Payer: Self-pay | Admitting: Cardiovascular Disease

## 2020-04-11 ENCOUNTER — Encounter: Payer: Self-pay | Admitting: Cardiovascular Disease

## 2020-04-11 ENCOUNTER — Ambulatory Visit: Payer: Medicare PPO | Admitting: Cardiovascular Disease

## 2020-04-11 ENCOUNTER — Other Ambulatory Visit: Payer: Self-pay

## 2020-04-11 VITALS — BP 112/72 | HR 76 | Ht 69.0 in | Wt 307.0 lb

## 2020-04-11 DIAGNOSIS — Z951 Presence of aortocoronary bypass graft: Secondary | ICD-10-CM | POA: Diagnosis not present

## 2020-04-11 DIAGNOSIS — E785 Hyperlipidemia, unspecified: Secondary | ICD-10-CM

## 2020-04-11 DIAGNOSIS — Z5181 Encounter for therapeutic drug level monitoring: Secondary | ICD-10-CM

## 2020-04-11 DIAGNOSIS — G4733 Obstructive sleep apnea (adult) (pediatric): Secondary | ICD-10-CM

## 2020-04-11 DIAGNOSIS — E7800 Pure hypercholesterolemia, unspecified: Secondary | ICD-10-CM | POA: Diagnosis not present

## 2020-04-11 DIAGNOSIS — I10 Essential (primary) hypertension: Secondary | ICD-10-CM | POA: Diagnosis not present

## 2020-04-11 NOTE — Patient Instructions (Addendum)
Medication Instructions:  ONLY TAKE THE HYDROCHLOROTHIAZIDE 25 MG DAILY   *If you need a refill on your cardiac medications before your next appointment, please call your pharmacy*  Lab Work: FASTING LP/CMET [place]   If you have labs (blood work) drawn today and your tests are completely normal, you will receive your results only by: Marland Kitchen MyChart Message (if you have MyChart) OR . A paper copy in the mail If you have any lab test that is abnormal or we need to change your treatment, we will call you to review the results.  Testing/Procedures: NONE   Follow-Up: At [facility], you and your health needs are our priority.  As part of our continuing mission to provide you with exceptional heart care, we have created designated Provider Care Teams.  These Care Teams include your primary Cardiologist (physician) and Advanced Practice Providers (APPs -  Physician Assistants and Nurse Practitioners) who all work together to provide you with the care you need, when you need it.  We recommend signing up for the patient portal called "MyChart".  Sign up information is provided on this After Visit Summary.  MyChart is used to connect with patients for Virtual Visits (Telemedicine).  Patients are able to view lab/test results, encounter notes, upcoming appointments, etc.  Non-urgent messages can be sent to your provider as well.   To learn more about what you can do with MyChart, go to [email or web address].    Your next appointment:   6 month(s)  You will receive a reminder letter in the mail two months in advance. If you don't receive a letter, please call our office to schedule the follow-up appointment.  The format for your next appointment:   In Person  Provider:   You may see Skeet Latch, MD or one of the following Advanced Practice Providers on your designated Care Team:    Kerin Ransom, PA-C  [place], [place]  Coletta Memos, [place]

## 2020-04-11 NOTE — Progress Notes (Signed)
Cardiology Office Note   Date:  04/11/2020   ID:  David Brandt, DOB April 11, 1946, MRN [id number]  PCP:  [name], MD  Cardiologist:   Skeet Latch, MD  [facility] APP: Kerin Ransom, PA-C  No chief complaint on file.    History of Present Illness: David Brandt is a 74 y.o. male with CAD s/p CABG (LIMA-->LAD, SVG-->D1,D2, SVG-->OM, SVG-->RCA) , diabetes, OSA on CPAP, post-operative atrial fibrillation, hypertension,emphysema,and hyperlipidemia who presents for follow. David Brandt initially presented with chest pressure and shortness of breath 03/2016. He underwent Lexiscan Myoviewon 03/28/16 that revealed LVEF 48% with a large inferior and inferior lateral MI and no evidence of ischemia. There was also basal to mid inferior wall akinesis. He subsequently underwent left heart catheterization where he was found to have severe three-vessel CAD. He underwent CABG with Dr. Servando Snare on 04/02/16. He developed post-operative atrial fibrillation and was started on amiodarone. There were were no other postoperative complications and he was discharged 04/11/16. Echo 03/30/16 revealed LVEF 55-60% and was otherwise unremarkable. Amiodaronewas discontinued 07/2016. Hydralazine was added to his regimen.  However since his last appointment he notes that his blood pressure sometimes gets low and he feels palpitations when he takes it 3 times a day.  He has been taking it twice a day and this seems to be much better.  He also noticed that he has a hydrochlorothiazide 12.5 mg dose prescribed by his PCP and 25 mg prescribed by me.  He has been taking both and notes that he has been urinating quite a bit.  He is otherwise been feeling pretty well.  He needs surgery on his back but has been instructed to lose 50 pounds before he can have this.  He is limiting his portions and trying to walk when able.  He has no exertional chest pain.  He does still get short of breath.  He notes that this started  after he had Covid in 07/2019.  He does not feel as though he is ever fully recovered.  He has no edema or orthopnea.  He has been moving more and working in his yard.  He struggles with back pain but has been generally feeling well.  He is supposed to go for back surgery but has to lose 50 lb first.  He notes that his back hurts less since he is losing weight.  He is limiting his portions.  Past Medical History:  Diagnosis Date   Abnormal cardiovascular stress test 03/29/2016   Allergic rhinitis    Allergy    Arthritis    Asthma    Diabetes ([place]) 03/29/2016   Gout    History of kidney stones    Hyperlipidemia    Hypertension    Sleep apnea    cpap    Past Surgical History:  Procedure Laterality Date   CARDIAC CATHETERIZATION N/A 03/29/2016   Procedure: Right/Left Heart Cath and Coronary Angiography;  Surgeon: Nelva Bush, MD;  Location: [facility];  Service: Cardiovascular;  Laterality: N/A;   CARDIAC CATHETERIZATION N/A 03/29/2016   Procedure: Intravascular Pressure Wire/FFR Study;  Surgeon: Nelva Bush, MD;  Location: [facility];  Service: Cardiovascular;  Laterality: N/A;   CARDIAC CATHETERIZATION Left 04/02/2016   Procedure: FEMORAL ARTERIAL LINE INSERTION;  Surgeon: [name], MD;  Location: [place];  Service: Open Heart Surgery;  Laterality: Left;   COLONOSCOPY  02/20/2018   per Dr. Carlean Purl, no polyps, repeat in 5 yrs (previous adenomatous polyps)  CORONARY ARTERY BYPASS GRAFT N/A 04/02/2016   Procedure: CORONARY ARTERY BYPASS GRAFTING (CABG) X 5 UTILIZING LEFT INTERNAL MAMMARY ARTERY AND ENDOSCOPICALLY HARVESTED GREATER SAPHENOUS VEIN ; Mammary to LAD, Sequencial 1st to 2nd Diagonal, Vein to OM, Vein to Distal RCA.;  Surgeon: [name], MD;  Location: [facility];  Service: Open Heart Surgery;  Laterality: N/A;   CYSTOSCOPY/URETEROSCOPY/HOLMIUM LASER/STENT PLACEMENT Bilateral 05/07/2019   Procedure: LEFT URETEROSCOPY/HOLMIUM  LASER/STENT PLACEMENT/BIOPSY LEFT MID URETER/RIGHT RETROGRADE STENT PLACEMENT ;  Surgeon: [name], MD;  Location: [facility];  Service: Urology;  Laterality: Bilateral;   CYSTOSCOPY/URETEROSCOPY/HOLMIUM LASER/STENT PLACEMENT Bilateral 05/27/2019   Procedure: CYSTOSCOPY, URETEROSCOPY/ RETROGRADE PYELOGRAM/ HOLMIUM LASER/STENT EXCHANGE;  Surgeon: [name], MD;  Location: [facility];  Service: Urology;  Laterality: Bilateral;   LYMPH NODE BIOPSY Left 04/02/2016   Procedure: INCIDENTAL LEFT MAMMARY LYMPH NODE BIOPSY;  Surgeon: [name], MD;  Location: [place];  Service: Open Heart Surgery;  Laterality: Left;   mass abdomen  1955   NEPHROLITHOTOMY Left 04/24/2019   Procedure: NEPHROLITHOTOMY PERCUTANEOUS WITH SURGEON ACCESS;  Surgeon: [name], MD;  Location: [facility];  Service: Urology;  Laterality: Left;   TEE WITHOUT CARDIOVERSION N/A 04/02/2016   Procedure: TRANSESOPHAGEAL ECHOCARDIOGRAM (TEE);  Surgeon: [name], MD;  Location: [place];  Service: Open Heart Surgery;  Laterality: N/A;     Current Outpatient Medications  Medication Sig Dispense Refill   aspirin EC 81 MG tablet Take 81 mg by mouth daily.     atorvastatin (LIPITOR) 80 MG tablet Take 1 tablet (80 mg total) by mouth daily at 6 PM. 180 tablet 3   carvedilol (COREG) 25 MG tablet TAKE 1 TABLET TWICE DAILY 180 tablet 2   Cholecalciferol (VITAMIN D3) 2000 units TABS Take 2,000 Units by mouth daily.     felodipine (PLENDIL) 10 MG 24 hr tablet TAKE 1 TABLET BY MOUTH EVERY DAY 90 tablet 3   hydrALAZINE (APRESOLINE) 50 MG tablet Take 50 mg by mouth in the morning and at bedtime.     hydrochlorothiazide (HYDRODIURIL) 25 MG tablet Take 25 mg by mouth daily.     metFORMIN (GLUCOPHAGE-XR) 500 MG 24 hr tablet TAKE 1 TABLET BY MOUTH EVERY DAY 90 tablet 3   OVER THE COUNTER MEDICATION Take 2 capsules by mouth 2 (two) times daily. OmegaXL     spironolactone (ALDACTONE) 25 MG tablet TAKE 1 TABLET  EVERY DAY 90 tablet 0   SYMBICORT 160-4.5 MCG/ACT inhaler TAKE 2 PUFFS BY MOUTH TWICE A DAY 30.6 Inhaler 3   testosterone cypionate (DEPOTESTOSTERONE CYPIONATE) 200 MG/ML injection INJECT 1 ML INTRAMUSCULARLY EVERY 14 DAYS 6 mL 1   No current facility-administered medications for this visit.    Allergies:   Allopurinol, Valsartan-hydrochlorothiazide, and Sulfa antibiotics    Social History:  The patient  reports that he quit smoking about 21 months ago. His smoking use included cigarettes. He has a 47.00 pack-year smoking history. He has never used smokeless tobacco. He reports current alcohol use of about 4.0 standard drinks of alcohol per week. He reports current drug use. Frequency: 7.00 times per week. Drug: Marijuana.   Family History:  [name] family history includes Colon cancer (age of onset: 24) in his father; Diabetes in his maternal uncle and mother; Heart disease in his maternal aunt; Stroke in his mother.    ROS:  Please see the history of present illness.   Otherwise, review of systems are positive for none.   All other systems  are reviewed and negative.    PHYSICAL EXAM: VS:  BP 112/72    Pulse 76    Ht 5\' 9"  (1.753 m)    Wt (!) 307 lb (139.3 kg)    SpO2 96%    BMI 45.34 kg/m  , BMI Body mass index is 45.34 kg/m. GENERAL:  Well appearing HEENT: Pupils equal round and reactive, fundi not visualized, oral mucosa unremarkable NECK:  No jugular venous distention, waveform within normal limits, carotid upstroke brisk and symmetric, no bruits LUNGS:  Clear to auscultation bilaterally HEART:  RRR.  PMI not displaced or sustained,S1 and S2 within normal limits, no S3, no S4, no clicks, no rubs, no murmurs ABD:  Flat, positive bowel sounds normal in frequency in pitch, no bruits, no rebound, no guarding, no midline pulsatile mass, no hepatomegaly, no splenomegaly EXT:  2 plus pulses throughout, no edema, no cyanosis no clubbing SKIN:  No rashes no nodules NEURO:  Cranial  nerves II through XII grossly intact, motor grossly intact throughout PSYCH:  Cognitively intact, oriented to person place and tim  EKG:  EKG is not  ordered today. The ekg ordered 05/02/16 demonstrates sinus rhythm. Rate 65 bpm.  12/09/17: Sinus rhythm.  Rate 68 bpm.  Prior septal infarct.  Inferolateral TWI 03/23/19: Sinus rhythm.  Rate 75 bpm. 10/02/2019: Sinus rhythm.  Rate 63 bpm.  Left axis deviation.  Low voltage.  Prior septal infarct.  Lateral T wave inversions.  Syncope in 3 months and follow-up  Lexiscan Myoview 03/29/16: Nuclear stress EF: 48%.  The left ventricular ejection fraction is mildly decreased (45-54%).   Large inferior and inferior lateral wall MI from apex to base with no ischemia EF 48% with mid and basal inferior wall akinesis   LHC 03/29/16: Conclusions: 1. Severe 3 vessel coronary artery disease, as detailed below, including 50% distal LMCA/ostial LAD stenoses (FFR 0.70), 80% D1 lesion, 80% ostial LCx stenosis, and diffusely diseased RCA with 99% mid RCA stenosis with TIMI-1 flow and competitive filling of the PDA and rPL branches by left-to-right collaterals. 2. Mildly decreased LV systolic function with inferior akinesis (LVEF 45-50%) 3. Upper normal to mildly elevated left heart filling pressures. 4. Reduced Fick cardiac output.   Echo  03/30/16:  Study Conclusions  - Left ventricle: The cavity size was normal. Wall thickness was   increased in a pattern of moderate LVH. Systolic function was   normal. The estimated ejection fraction was in the range of 55%   to 60%. Left ventricular diastolic function parameters were   normal. - Left atrium: The atrium was mildly dilated. - Atrial septum: No defect or patent foramen ovale was identified. - Pulmonary arteries: PA peak pressure: 34 mm Hg (S).  Recent Labs: 04/21/2019: ALT 22 05/25/2019: Hemoglobin 16.2; Platelets 181 07/07/2019: BUN 14; Creatinine, Ser 1.30; Potassium 4.2; Sodium 138    Lipid Panel      Component Value Date/Time   CHOL 116 03/30/2019 0845   TRIG 213 (H) 03/30/2019 0845   HDL 37 (L) 03/30/2019 0845   CHOLHDL 3.1 03/30/2019 0845   CHOLHDL 4.1 03/30/2016 0156   VLDL 59 (H) 03/30/2016 0156   LDLCALC 45 03/30/2019 0845      Wt Readings from Last 3 Encounters:  04/11/20 (!) 307 lb (139.3 kg)  10/02/19 (!) 318 lb (144.2 kg)  08/04/19 (!) 313 lb 3.2 oz (142.1 kg)      ASSESSMENT AND PLAN:  # CAD s/p CABG:  # Hyperlipidemia: # Shortness of breath: Symptoms  have improved.  Echo and Lexiscan Myoview were unremarkable 03/2019.  Continue aspirin, atorvastatin, carvedilol.    Continue to encourage him to exercise. He will focus on limiting carbs.  Check fasting lipids and CMP.  Consider Vascepa if triglycerides remain elevated.  # Hypertension: Blood pressure improved since adding hydralazine.  Continue felodipine, hydrochlorothiazide, spironolactone, and carvedilol.  He will make sure to only take HCTZ 25mg .  # Post-op atrial fibrillation: No recurrence since stopping amiodarone. Continue carvedilol.  He is not on chronic anticoagulation.  # OSA: Encouraged continued use of CPAP.  He is doing well with it.  # Morbid obesity: We discussed following a Mediterranean diet. We also talked about reducing his snacks and portions. 150 minutes of exercise weekly.   He will focus on limiting carbs.  He was congratulated on his weight loss thus far.   Current medicines are reviewed at length with the patient today.  The patient does not have concerns regarding medicines.  The following changes have been made:  no change  Labs/ tests ordered today include:   Orders Placed This Encounter  Procedures   Lipid panel   Comprehensive metabolic panel     Disposition:   FU with Malak Orantes C. Oval Linsey, MD, [facility] in 6 months.     Signed, Tamryn Popko C. Oval Linsey, MD, [facility]  04/11/2020 12:44 PM    [place]

## 2020-04-13 DIAGNOSIS — M4726 Other spondylosis with radiculopathy, lumbar region: Secondary | ICD-10-CM | POA: Diagnosis not present

## 2020-04-13 DIAGNOSIS — M48062 Spinal stenosis, lumbar region with neurogenic claudication: Secondary | ICD-10-CM | POA: Diagnosis not present

## 2020-04-14 DIAGNOSIS — I10 Essential (primary) hypertension: Secondary | ICD-10-CM | POA: Diagnosis not present

## 2020-04-14 DIAGNOSIS — Z5181 Encounter for therapeutic drug level monitoring: Secondary | ICD-10-CM | POA: Diagnosis not present

## 2020-04-14 DIAGNOSIS — E7800 Pure hypercholesterolemia, unspecified: Secondary | ICD-10-CM | POA: Diagnosis not present

## 2020-04-14 LAB — COMPREHENSIVE METABOLIC PANEL
ALT: 18 IU/L (ref 0–44)
AST: 17 IU/L (ref 0–40)
Albumin/Globulin Ratio: 1.6 (ref 1.2–2.2)
Albumin: 4.1 g/dL (ref 3.7–4.7)
Alkaline Phosphatase: 94 IU/L (ref 44–121)
BUN/Creatinine Ratio: 13 (ref 10–24)
BUN: 15 mg/dL (ref 8–27)
Bilirubin Total: 1 mg/dL (ref 0.0–1.2)
CO2: 22 mmol/L (ref 20–29)
Calcium: 9.8 mg/dL (ref 8.6–10.2)
Chloride: 98 mmol/L (ref 96–106)
Creatinine, Ser: 1.15 mg/dL (ref 0.76–1.27)
GFR calc Af Amer: 73 mL/min/{1.73_m2} (ref >59)
GFR calc non Af Amer: 63 mL/min/{1.73_m2} (ref >59)
Globulin, Total: 2.6 g/dL (ref 1.5–4.5)
Glucose: 104 mg/dL — ABNORMAL HIGH (ref 65–99)
Potassium: 4.4 mmol/L (ref 3.5–5.2)
Sodium: 135 mmol/L (ref 134–144)
Total Protein: 6.7 g/dL (ref 6.0–8.5)

## 2020-04-14 LAB — LIPID PANEL
Chol/HDL Ratio: 3 ratio (ref 0.0–5.0)
Cholesterol, Total: 127 mg/dL (ref 100–199)
HDL: 43 mg/dL (ref >39)
LDL Chol Calc (NIH): 57 mg/dL (ref 0–99)
Triglycerides: 160 mg/dL — ABNORMAL HIGH (ref 0–149)
VLDL Cholesterol Cal: 27 mg/dL (ref 5–40)

## 2020-04-19 ENCOUNTER — Other Ambulatory Visit: Payer: Self-pay | Admitting: Family Medicine

## 2020-04-19 DIAGNOSIS — E291 Testicular hypofunction: Secondary | ICD-10-CM

## 2020-04-19 NOTE — Telephone Encounter (Signed)
Patient called, left VM to return the call to schedule a physical.

## 2020-04-19 NOTE — Telephone Encounter (Signed)
FYI: refill was refused.   Last OV with PCP and labs was November of 2020.   No upcoming appointments scheduled.

## 2020-04-20 NOTE — Telephone Encounter (Signed)
No, he will need an exam and labs before we can refill this

## 2020-04-20 NOTE — Telephone Encounter (Signed)
Patient called, left VM to return the call to schedule a physical for after 04/27/20. Last CPE 04/28/19. Will need this done before refilling testosterone.

## 2020-04-29 ENCOUNTER — Telehealth: Payer: Self-pay

## 2020-04-29 ENCOUNTER — Telehealth: Payer: Self-pay | Admitting: *Deleted

## 2020-04-29 ENCOUNTER — Other Ambulatory Visit: Payer: Self-pay | Admitting: *Deleted

## 2020-04-29 DIAGNOSIS — E291 Testicular hypofunction: Secondary | ICD-10-CM

## 2020-04-29 MED ORDER — TESTOSTERONE CYPIONATE 200 MG/ML IM SOLN: INTRAMUSCULAR | 1 refills | Status: DC

## 2020-04-29 NOTE — Telephone Encounter (Signed)
This was done by fax

## 2020-04-29 NOTE — Telephone Encounter (Signed)
Patient called stating the pharmacy sent over a request for him to get his injection medication refilled.

## 2020-04-29 NOTE — Telephone Encounter (Signed)
Patient called in needing a refill sent to the pharmacy    testosterone cypionate (DEPOTESTOSTERONE CYPIONATE) 200 MG/ML injection   [id number] - [address], [place] - [address]

## 2020-04-29 NOTE — Telephone Encounter (Signed)
Please advise/resent

## 2020-04-29 NOTE — Telephone Encounter (Signed)
[facility]ed Rx [facility]

## 2020-05-16 ENCOUNTER — Other Ambulatory Visit: Payer: Self-pay | Admitting: *Deleted

## 2020-05-16 DIAGNOSIS — I10 Essential (primary) hypertension: Secondary | ICD-10-CM

## 2020-05-16 DIAGNOSIS — E7800 Pure hypercholesterolemia, unspecified: Secondary | ICD-10-CM

## 2020-05-27 DIAGNOSIS — M48062 Spinal stenosis, lumbar region with neurogenic claudication: Secondary | ICD-10-CM | POA: Diagnosis not present

## 2020-05-27 DIAGNOSIS — M4726 Other spondylosis with radiculopathy, lumbar region: Secondary | ICD-10-CM | POA: Diagnosis not present

## 2020-05-31 ENCOUNTER — Other Ambulatory Visit: Payer: Self-pay | Admitting: Cardiovascular Disease

## 2020-06-03 ENCOUNTER — Encounter: Payer: Self-pay | Admitting: Family Medicine

## 2020-06-03 ENCOUNTER — Other Ambulatory Visit: Payer: Self-pay

## 2020-06-03 ENCOUNTER — Ambulatory Visit: Payer: Medicare PPO | Admitting: Family Medicine

## 2020-06-03 DIAGNOSIS — I10 Essential (primary) hypertension: Secondary | ICD-10-CM | POA: Diagnosis not present

## 2020-06-03 DIAGNOSIS — E1169 Type 2 diabetes mellitus with other specified complication: Secondary | ICD-10-CM | POA: Diagnosis not present

## 2020-06-03 LAB — POCT GLYCOSYLATED HEMOGLOBIN (HGB A1C): Hemoglobin A1C: 5.8 % — AB (ref 4.0–5.6)

## 2020-06-03 NOTE — Progress Notes (Signed)
   Subjective:    Patient ID: David Brandt, male    DOB: [birth year], 74 y.o.   MRN: [id number]  HPI Here for help with losing weight. He feels well except for some low back pain. His BP is stable. His am fastin glucoses run around 110-120. He sees Dr. Rennis Harding for his spinal issues, and he has proposed doing surgery on him. However Dr. Patrice Paradise said he needs to lose 50 lbs before he would consider this. So far David Brandt has lost 20 lbs but he cannot get any further.    Review of Systems  Constitutional: Negative.   Respiratory: Negative.   Cardiovascular: Negative.   Musculoskeletal: Positive for back pain.       Objective:   Physical Exam Constitutional:      Appearance: He is obese.  Cardiovascular:     Rate and Rhythm: Normal rate and regular rhythm.     Pulses: Normal pulses.     Heart sounds: Normal heart sounds.  Pulmonary:     Effort: Pulmonary effort is normal.     Breath sounds: Normal breath sounds.  Neurological:     Mental Status: He is alert.           Assessment & Plan:  His HTN and DM are doing well, but he is struggling to lose weight. We wil [facility]. Alysia Penna, MD

## 2020-06-13 ENCOUNTER — Other Ambulatory Visit: Payer: Self-pay | Admitting: Cardiovascular Disease

## 2020-07-08 DIAGNOSIS — G4733 Obstructive sleep apnea (adult) (pediatric): Secondary | ICD-10-CM | POA: Diagnosis not present

## 2020-07-08 DIAGNOSIS — G4731 Primary central sleep apnea: Secondary | ICD-10-CM | POA: Diagnosis not present

## 2020-07-11 ENCOUNTER — Ambulatory Visit (INDEPENDENT_AMBULATORY_CARE_PROVIDER_SITE_OTHER): Payer: Self-pay | Admitting: Family Medicine

## 2020-07-15 ENCOUNTER — Other Ambulatory Visit: Payer: Self-pay | Admitting: *Deleted

## 2020-07-15 MED ORDER — TRUE METRIX METER DEVI: 0 refills | Status: DC

## 2020-07-15 MED ORDER — TRUE METRIX BLOOD GLUCOSE TEST VI STRP: ORAL_STRIP | 12 refills | Status: DC

## 2020-07-15 MED ORDER — TRUE METRIX LEVEL 1 LOW VI SOLN: 3 refills | Status: DC

## 2020-07-19 ENCOUNTER — Other Ambulatory Visit: Payer: Self-pay

## 2020-07-19 MED ORDER — HYDRALAZINE HCL 50 MG PO TABS
50.0000 mg | ORAL_TABLET | Freq: Two times a day (BID) | ORAL | 3 refills | Status: DC
Start: 2020-07-19 — End: 2020-11-23

## 2020-07-21 ENCOUNTER — Other Ambulatory Visit: Payer: Self-pay

## 2020-07-21 MED ORDER — TRUE METRIX BLOOD GLUCOSE TEST VI STRP
ORAL_STRIP | 12 refills | Status: DC
Start: 2020-07-21 — End: 2020-11-23

## 2020-07-21 MED ORDER — BD SWAB SINGLE USE REGULAR PADS: MEDICATED_PAD | 3 refills | Status: DC

## 2020-07-21 MED ORDER — METFORMIN HCL ER 500 MG PO TB24: 500.0000 mg | ORAL_TABLET | Freq: Every day | ORAL | 3 refills | Status: DC

## 2020-07-22 DIAGNOSIS — M4726 Other spondylosis with radiculopathy, lumbar region: Secondary | ICD-10-CM | POA: Diagnosis not present

## 2020-07-22 DIAGNOSIS — M48062 Spinal stenosis, lumbar region with neurogenic claudication: Secondary | ICD-10-CM | POA: Diagnosis not present

## 2020-07-25 ENCOUNTER — Other Ambulatory Visit: Payer: Self-pay

## 2020-07-25 ENCOUNTER — Encounter (INDEPENDENT_AMBULATORY_CARE_PROVIDER_SITE_OTHER): Payer: Self-pay | Admitting: Family Medicine

## 2020-07-25 ENCOUNTER — Ambulatory Visit (INDEPENDENT_AMBULATORY_CARE_PROVIDER_SITE_OTHER): Payer: Self-pay | Admitting: Family Medicine

## 2020-07-25 ENCOUNTER — Ambulatory Visit (INDEPENDENT_AMBULATORY_CARE_PROVIDER_SITE_OTHER): Payer: Medicare PPO | Admitting: Family Medicine

## 2020-07-25 VITALS — BP 154/77 | HR 93 | Temp 97.6°F | Ht 68.0 in | Wt 302.0 lb

## 2020-07-25 DIAGNOSIS — E7849 Other hyperlipidemia: Secondary | ICD-10-CM

## 2020-07-25 DIAGNOSIS — Z6841 Body Mass Index (BMI) 40.0 and over, adult: Secondary | ICD-10-CM | POA: Diagnosis not present

## 2020-07-25 DIAGNOSIS — E1169 Type 2 diabetes mellitus with other specified complication: Secondary | ICD-10-CM | POA: Diagnosis not present

## 2020-07-25 DIAGNOSIS — E785 Hyperlipidemia, unspecified: Secondary | ICD-10-CM | POA: Diagnosis not present

## 2020-07-25 DIAGNOSIS — G4733 Obstructive sleep apnea (adult) (pediatric): Secondary | ICD-10-CM | POA: Diagnosis not present

## 2020-07-25 DIAGNOSIS — E559 Vitamin D deficiency, unspecified: Secondary | ICD-10-CM | POA: Diagnosis not present

## 2020-07-25 DIAGNOSIS — I10 Essential (primary) hypertension: Secondary | ICD-10-CM | POA: Diagnosis not present

## 2020-07-25 DIAGNOSIS — Z1331 Encounter for screening for depression: Secondary | ICD-10-CM | POA: Diagnosis not present

## 2020-07-25 DIAGNOSIS — R5383 Other fatigue: Secondary | ICD-10-CM

## 2020-07-25 DIAGNOSIS — R0602 Shortness of breath: Secondary | ICD-10-CM | POA: Diagnosis not present

## 2020-07-25 DIAGNOSIS — E1165 Type 2 diabetes mellitus with hyperglycemia: Secondary | ICD-10-CM | POA: Diagnosis not present

## 2020-07-25 DIAGNOSIS — Z0289 Encounter for other administrative examinations: Secondary | ICD-10-CM

## 2020-07-26 LAB — CBC WITH DIFFERENTIAL/PLATELET
Basophils Absolute: 0.1 10*3/uL (ref 0.0–0.2)
Basos: 1 %
EOS (ABSOLUTE): 0.3 10*3/uL (ref 0.0–0.4)
Eos: 3 %
Hematocrit: 64.2 % — ABNORMAL HIGH (ref 37.5–51.0)
Hemoglobin: 20.8 g/dL (ref 13.0–17.7)
Immature Grans (Abs): 0.1 10*3/uL (ref 0.0–0.1)
Immature Granulocytes: 1 %
Lymphocytes Absolute: 1.5 10*3/uL (ref 0.7–3.1)
Lymphs: 15 %
MCH: 30.1 pg (ref 26.6–33.0)
MCHC: 32.4 g/dL (ref 31.5–35.7)
MCV: 93 fL (ref 79–97)
Monocytes Absolute: 1.2 10*3/uL — ABNORMAL HIGH (ref 0.1–0.9)
Monocytes: 12 %
Neutrophils Absolute: 7.2 10*3/uL — ABNORMAL HIGH (ref 1.4–7.0)
Neutrophils: 68 %
Platelets: 195 10*3/uL (ref 150–450)
RBC: 6.9 x10E6/uL — ABNORMAL HIGH (ref 4.14–5.80)
RDW: 13.1 % (ref 11.6–15.4)
WBC: 10.2 10*3/uL (ref 3.4–10.8)

## 2020-07-26 LAB — MICROALBUMIN / CREATININE URINE RATIO
Creatinine, Urine: 62.5 mg/dL
Microalb/Creat Ratio: 9 mg/g creat (ref 0–29)
Microalbumin, Urine: 5.5 ug/mL

## 2020-07-26 LAB — LIPID PANEL WITH LDL/HDL RATIO
Cholesterol, Total: 115 mg/dL (ref 100–199)
HDL: 39 mg/dL — ABNORMAL LOW (ref >39)
LDL Chol Calc (NIH): 44 mg/dL (ref 0–99)
LDL/HDL Ratio: 1.1 ratio (ref 0.0–3.6)
Triglycerides: 194 mg/dL — ABNORMAL HIGH (ref 0–149)
VLDL Cholesterol Cal: 32 mg/dL (ref 5–40)

## 2020-07-26 LAB — COMPREHENSIVE METABOLIC PANEL
ALT: 19 IU/L (ref 0–44)
AST: 23 IU/L (ref 0–40)
Albumin/Globulin Ratio: 1.5 (ref 1.2–2.2)
Albumin: 4.2 g/dL (ref 3.7–4.7)
Alkaline Phosphatase: 119 IU/L (ref 44–121)
BUN/Creatinine Ratio: 10 (ref 10–24)
BUN: 11 mg/dL (ref 8–27)
Bilirubin Total: 0.6 mg/dL (ref 0.0–1.2)
CO2: 24 mmol/L (ref 20–29)
Calcium: 10 mg/dL (ref 8.6–10.2)
Chloride: 98 mmol/L (ref 96–106)
Creatinine, Ser: 1.12 mg/dL (ref 0.76–1.27)
GFR calc Af Amer: 75 mL/min/{1.73_m2} (ref >59)
GFR calc non Af Amer: 65 mL/min/{1.73_m2} (ref >59)
Globulin, Total: 2.8 g/dL (ref 1.5–4.5)
Glucose: 96 mg/dL (ref 65–99)
Potassium: 3.8 mmol/L (ref 3.5–5.2)
Sodium: 140 mmol/L (ref 134–144)
Total Protein: 7 g/dL (ref 6.0–8.5)

## 2020-07-26 LAB — T3: T3, Total: 141 ng/dL (ref 71–180)

## 2020-07-26 LAB — T4: T4, Total: 8.2 ug/dL (ref 4.5–12.0)

## 2020-07-26 LAB — VITAMIN D 25 HYDROXY (VIT D DEFICIENCY, FRACTURES): Vit D, 25-Hydroxy: 39.1 ng/mL (ref 30.0–100.0)

## 2020-07-26 LAB — INSULIN, RANDOM: INSULIN: 22.6 u[IU]/mL (ref 2.6–24.9)

## 2020-07-26 LAB — TSH: TSH: 2.05 u[IU]/mL (ref 0.450–4.500)

## 2020-07-26 NOTE — Progress Notes (Signed)
Dear Dr. [name],   Thank you for referring Franklyn Cafaro to our clinic. The following note includes my evaluation and treatment recommendations.  Chief Complaint:   OBESITY Maahir Horst (MR# [id number]) is a 75 y.o. male who presents for evaluation and treatment of obesity and related comorbidities. Current BMI is Body mass index is 45.92 kg/m. Trestan has been struggling with his weight for many years and has been unsuccessful in either losing weight, maintaining weight loss, or reaching his healthy weight goal.  Cashis is currently in the action stage of change and ready to dedicate time achieving and maintaining a healthier weight. Shomari is interested in becoming our patient and working on intensive lifestyle modifications including (but not limited to) diet and exercise for weight loss.  Riyansh was referred by Dr. Alysia Penna.  He started gaining weight when he retired.  He skips breakfast 5 times per week (not in the habit of eating breakfast).  Tea in the morning - with Tang, lemonade (instant).  Around 11 am - 1 pm will be lunch:  3 eggs, 3-4 strips of bacon, 2-3 slices of toast with jelly (feel full).  Dinner will be fried chicken (couple of wings and a thigh), 1 cup of greens, 1/2 cup of mashed potato (feel full).  For an after dinner snack, apple/pear/banana/grapes.    Elihue's habits were reviewed today and are as follows: His family eats meals together, he thinks his family will eat healthier with him, his desired weight loss is 110 pounds, he started gaining weight after he retired, his heaviest weight ever was 325 pounds, he has significant food cravings issues, he snacks frequently in the evenings, he skips breakfast frequently, he is frequently drinking liquids with calories, he frequently makes poor food choices and he frequently eats larger portions than normal.  Depression Screen Anthoni's Food and Mood (modified PHQ-9) score was 7.  Depression screen [facility] 2/9  07/25/2020  Decreased Interest 0  Down, Depressed, Hopeless 0  PHQ - 2 Score 0  Altered sleeping 0  Tired, decreased energy 3  Change in appetite 0  Feeling bad or failure about yourself  1  Trouble concentrating 1  Moving slowly or fidgety/restless 2  Suicidal thoughts 0  PHQ-9 Score 7  Difficult doing work/chores Somewhat difficult   Subjective:   1. Other fatigue Cato denies daytime somnolence and denies waking up still tired. Patent has a history of symptoms of OSA.  He has a CPAP machine. Kaide generally gets 8 hours of sleep per night, and states that he has generally restful sleep. Snoring is not present. Apneic episodes are not present. Epworth Sleepiness Score is 1.    EKG - NSR @ 80 bpm.  2. SOBOE (shortness of breath on exertion) [name] notes increasing shortness of breath with exercising and seems to be worsening over time with weight gain. He notes getting out of breath sooner with activity than he used to. This has gotten worse recently. Malak denies shortness of breath at rest or orthopnea.  3. Essential hypertension Diagnosed many years ago.  Sees Dr. Oval Linsey in Cardiology.  Blood pressure is normally well controlled.  Denies chest pain, chest pressure, headache.  He took his medications today.  4. Other hyperlipidemia On atorvastatin, aspirin, Prevagen.  Last LDL 57.  5. Type 2 diabetes mellitus with hyperglycemia, without long-term current use of insulin (HCC) On metformin.  Last eye exam was last year.  Last foot exam:  Never.  Last A1c was 5.8 (  improved from 6.8).  6. OSA (obstructive sleep apnea) Wears CPAP nightly with almost 100% compliance.  Last Pulmonology appointment with Dr. Elsworth Soho.  7. Vitamin D deficiency He is not on a supplement.  Endorses fatigue.  Diagnosis likely given obesity.  8. Depression screening Alikai was screened for depression as part of his new patient workup today.  PHQ-9 is 7.  Assessment/Plan:   1. Other fatigue Zandyn  does feel that his weight is causing his energy to be lower than it should be. Fatigue may be related to obesity, depression or many other causes. Labs will be ordered, and in the meanwhile, Harless will focus on self care including making healthy food choices, increasing physical activity and focusing on stress reduction.  - EKG 12-Lead - T3 - T4 - TSH - CBC w/Diff/Platelet  2. SOBOE (shortness of breath on exertion) Estaban does feel that he gets out of breath more easily that he used to when he exercises. Neal's shortness of breath appears to be obesity related and exercise induced. He has agreed to work on weight loss and gradually increase exercise to treat his exercise induced shortness of breath. Will continue to monitor closely.  - CBC with Differential/Platelet  3. Essential hypertension Oluwajomiloju is working on healthy weight loss and exercise to improve blood pressure control. We will watch for signs of hypotension as he continues his lifestyle modifications.  Will check FLP, CMP today.  Follow-up with Cardiology.  - Comprehensive metabolic panel  4. Other hyperlipidemia Cardiovascular risk and specific lipid/LDL goals reviewed.  We discussed several lifestyle modifications today and Benedicto will continue to work on diet, exercise and weight loss efforts. Orders and follow up as documented in patient record.  Check FLP today.  Counseling Intensive lifestyle modifications are the first line treatment for this issue. . Dietary changes: Increase soluble fiber. Decrease simple carbohydrates. . Exercise changes: Moderate to vigorous-intensity aerobic activity 150 minutes per week if tolerated. . Lipid-lowering medications: see documented in medical record.  - Lipid Panel With LDL/HDL Ratio  5. Type 2 diabetes mellitus with hyperglycemia, without long-term current use of insulin (HCC) Good blood sugar control is important to decrease the likelihood of diabetic complications such as  nephropathy, neuropathy, limb loss, blindness, coronary artery disease, and death. Intensive lifestyle modification including diet, exercise and weight loss are the first line of treatment for diabetes.  Will check insulin level and urine MAB today.  - Insulin, random - Urine Microalbumin w/creat. ratio  6. OSA (obstructive sleep apnea) Intensive lifestyle modifications are the first line treatment for this issue. We discussed several lifestyle modifications today and he will continue to work on diet, exercise and weight loss efforts. We will continue to monitor. Orders and follow up as documented in patient record.  Follow-up with Dr. Elsworth Soho.  7. Vitamin D deficiency Low Vitamin D level contributes to fatigue and are associated with obesity, breast, and colon cancer.  Will check vitamin D level today.  - VITAMIN D 25 Hydroxy (Vit-D Deficiency, Fractures)  8. Depression screening Gaylard had a positive depression screening. Depression is commonly associated with obesity and often results in emotional eating behaviors. We will monitor this closely and work on CBT to help improve the non-hunger eating patterns. Referral to Psychology may be required if no improvement is seen as he continues in our clinic.  9. Class 3 severe obesity with serious comorbidity and body mass index (BMI) of 45.0 to 49.9 in adult, unspecified obesity type (HCC)  Ankit is currently  in the action stage of change and his goal is to continue with weight loss efforts. I recommend Neel begin the structured treatment plan as follows:  He has agreed to the Category 3 Plan.  Exercise goals: No exercise has been prescribed at this time.   Behavioral modification strategies: increasing lean protein intake, no skipping meals, meal planning and cooking strategies and keeping healthy foods in the home.  He was informed of the importance of frequent follow-up visits to maximize his success with intensive lifestyle modifications  for his multiple health conditions. He was informed we would discuss his lab results at his next visit unless there is a critical issue that needs to be addressed sooner. Caine agreed to keep his next visit at the agreed upon time to discuss these results.  Objective:   Blood pressure (!) 154/77, pulse 93, temperature 97.6 F (36.4 C), temperature source Oral, height 5\' 8"  (1.727 m), weight (!) 302 lb (137 kg), SpO2 97 %. Body mass index is 45.92 kg/m.  EKG: Normal sinus rhythm, rate 80 bpm.  Indirect Calorimeter completed today shows a VO2 of 272 and a REE of 1890.  His calculated basal metabolic rate is 0000000 thus his basal metabolic rate is worse than expected.  General: Cooperative, alert, well developed, in no acute distress. HEENT: Conjunctivae and lids unremarkable. Cardiovascular: Regular rhythm.  Lungs: Normal work of breathing. Neurologic: No focal deficits.   Lab Results  Component Value Date   CREATININE 1.12 07/25/2020   BUN 11 07/25/2020   NA 140 07/25/2020   K 3.8 07/25/2020   CL 98 07/25/2020   CO2 24 07/25/2020   Lab Results  Component Value Date   ALT 19 07/25/2020   AST 23 07/25/2020   ALKPHOS 119 07/25/2020   BILITOT 0.6 07/25/2020   Lab Results  Component Value Date   HGBA1C 5.8 (A) 06/03/2020   HGBA1C 6.8 (H) 04/21/2019   HGBA1C 6.0 (A) 03/06/2018   HGBA1C 6.8 (H) 12/06/2017   HGBA1C 6.3 (H) 03/31/2016   Lab Results  Component Value Date   INSULIN 22.6 07/25/2020   Lab Results  Component Value Date   TSH 2.050 07/25/2020   Lab Results  Component Value Date   CHOL 115 07/25/2020   HDL 39 (L) 07/25/2020   LDLCALC 44 07/25/2020   TRIG 194 (H) 07/25/2020   CHOLHDL 3.0 04/14/2020   Lab Results  Component Value Date   WBC 10.2 07/25/2020   HGB 20.8 (HH) 07/25/2020   HCT 64.2 (H) 07/25/2020   MCV 93 07/25/2020   PLT 195 07/25/2020   Obesity Behavioral Intervention:   Approximately 15 minutes were spent on the discussion  below.  ASK: We discussed the diagnosis of obesity with [name] today and Staley agreed to give [name] permission to discuss obesity behavioral modification therapy today.  ASSESS: Onyx has the diagnosis of obesity and his BMI today is 45.9. Ladavion is in the action stage of change.   ADVISE: Javani was educated on the multiple health risks of obesity as well as the benefit of weight loss to improve his health. He was advised of the need for long term treatment and the importance of lifestyle modifications to improve his current health and to decrease his risk of future health problems.  AGREE: Multiple dietary modification options and treatment options were discussed and Wilmot agreed to follow the recommendations documented in the above note.  ARRANGE: Miklos was educated on the importance of frequent visits to treat obesity as outlined  per CMS and USPSTF guidelines and agreed to schedule his next follow up appointment today.  Attestation Statements:   Reviewed by clinician on day of visit: allergies, medications, problem list, medical history, surgical history, family history, social history, and previous encounter notes.  This is the patient's first visit at [facility]. [name] NEW PATIENT PACKET was reviewed at length. Included in the packet: current and past health history, medications, allergies, ROS, gynecologic history (women only), surgical history, family history, social history, weight history, weight loss surgery history (for those that have had weight loss surgery), nutritional evaluation, mood and food questionnaire, PHQ9, Epworth questionnaire, sleep habits questionnaire, patient life and health improvement goals questionnaire. These will all be scanned into the patient's chart under media.   During the visit, I independently reviewed the patient's EKG, bioimpedance scale results, and indirect calorimeter results. I used this information to tailor a meal plan  for the patient that will help him to lose weight and will improve his obesity-related conditions going forward. I performed a medically necessary appropriate examination and/or evaluation. I discussed the assessment and treatment plan with the patient. The patient was provided an opportunity to ask questions and all were answered. The patient agreed with the plan and demonstrated an understanding of the instructions. Labs were ordered at this visit and will be reviewed at the next visit unless more critical results need to be addressed immediately. Clinical information was updated and documented in the EMR.   Time spent on visit including pre-visit chart review and post-visit care was 45 minutes.   A separate 15 minutes was spent on risk counseling (see above).   I, Water quality scientist, CMA, am acting as transcriptionist for Coralie Common, MD.  I have reviewed the above documentation for accuracy and completeness, and I agree with the above. - Jinny Blossom, MD

## 2020-07-27 DIAGNOSIS — R5383 Other fatigue: Secondary | ICD-10-CM | POA: Diagnosis not present

## 2020-07-28 LAB — CBC WITH DIFFERENTIAL/PLATELET
Basophils Absolute: 0.1 10*3/uL (ref 0.0–0.2)
Basos: 1 %
EOS (ABSOLUTE): 0.3 10*3/uL (ref 0.0–0.4)
Eos: 4 %
Hematocrit: 59.9 % — ABNORMAL HIGH (ref 37.5–51.0)
Hemoglobin: 20.1 g/dL (ref 13.0–17.7)
Immature Grans (Abs): 0 10*3/uL (ref 0.0–0.1)
Immature Granulocytes: 0 %
Lymphocytes Absolute: 1.3 10*3/uL (ref 0.7–3.1)
Lymphs: 16 %
MCH: 30.7 pg (ref 26.6–33.0)
MCHC: 33.6 g/dL (ref 31.5–35.7)
MCV: 92 fL (ref 79–97)
Monocytes Absolute: 0.9 10*3/uL (ref 0.1–0.9)
Monocytes: 12 %
Neutrophils Absolute: 5.3 10*3/uL (ref 1.4–7.0)
Neutrophils: 67 %
Platelets: 195 10*3/uL (ref 150–450)
RBC: 6.55 x10E6/uL — ABNORMAL HIGH (ref 4.14–5.80)
RDW: 14.7 % (ref 11.6–15.4)
WBC: 7.8 10*3/uL (ref 3.4–10.8)

## 2020-08-08 ENCOUNTER — Ambulatory Visit (INDEPENDENT_AMBULATORY_CARE_PROVIDER_SITE_OTHER): Payer: Medicare PPO | Admitting: Family Medicine

## 2020-08-08 ENCOUNTER — Encounter (INDEPENDENT_AMBULATORY_CARE_PROVIDER_SITE_OTHER): Payer: Self-pay | Admitting: Family Medicine

## 2020-08-08 ENCOUNTER — Other Ambulatory Visit: Payer: Self-pay

## 2020-08-08 VITALS — BP 134/70 | HR 66 | Temp 97.6°F | Ht 68.0 in | Wt 296.0 lb

## 2020-08-08 DIAGNOSIS — E1165 Type 2 diabetes mellitus with hyperglycemia: Secondary | ICD-10-CM | POA: Diagnosis not present

## 2020-08-08 DIAGNOSIS — E559 Vitamin D deficiency, unspecified: Secondary | ICD-10-CM

## 2020-08-08 DIAGNOSIS — Z6841 Body Mass Index (BMI) 40.0 and over, adult: Secondary | ICD-10-CM | POA: Diagnosis not present

## 2020-08-08 DIAGNOSIS — D751 Secondary polycythemia: Secondary | ICD-10-CM | POA: Diagnosis not present

## 2020-08-09 NOTE — Progress Notes (Signed)
Chief Complaint:   OBESITY David Brandt is here to discuss his progress with his obesity treatment plan along with follow-up of his obesity related diagnoses. David Brandt is on the Category 3 Plan and states he is following his eating plan approximately 100% of the time. David Brandt states he is doing 0 minutes 0 times per week.  Today's visit was #: 2 Starting weight: 302 lbs Starting date: 07/25/2020 Today's weight: 296 lbs Today's date: 08/08/2020 Total lbs lost to date: 6 lbs Total lbs lost since last in-office visit: 6 lbs  Interim History: David Brandt denies hunger. He reports the first few weeks went better than he thought. He has acquired a taste for salmon and is enjoying David Brandt bread. For snacks, David Brandt is doing fruit and yogurt. He likes the egg option at breakfast. He is getting 2 oz of David Brandt at lunch. He is not bale to get in the full 8 oz at dinner.  Subjective:   1. Polycythemia David Brandt has an elevated RBC and hemoglobin and hematocrit x 2 (verified). His LFTs are within normal limits. David Brandt is on IM testosterone injections. His last testosterone level was checked in 2020 and was within normal.  2. Vitamin D deficiency David Brandt's last Vit D level was 39.1. He is on OTC Vit D 2,000 units daily. He reports fatigue.   3. Type 2 diabetes mellitus with hyperglycemia, without long-term current use of insulin (HCC) David Brandt's last A1c was 5.8 with an insulin level of 22.6. He is on Metformin 500 mg daily.    Assessment/Plan:   1. Polycythemia Check labs today.  - Testosterone,Free and Total - PSA  2. Vitamin D deficiency Low Vitamin D level contributes to fatigue and are associated with obesity, breast, and colon cancer. He agrees to increase OTC Vitamin D to 4,000 units daily and will follow-up for routine testing of Vitamin D, at least 2-3 times per year to avoid over-replacement.  3. Type 2 diabetes mellitus with hyperglycemia, without long-term current use of insulin (HCC) Good blood  sugar control is important to decrease the likelihood of diabetic complications such as nephropathy, neuropathy, limb loss, blindness, coronary artery disease, and death. Intensive lifestyle modification including diet, exercise and weight loss are the first line of treatment for diabetes. Continue Metformin with no change in dose. Check labs in 3 months.  4. Class 3 severe obesity with serious comorbidity and body mass index (BMI) of 45.0 to 49.9 in adult, unspecified obesity type (HCC) David Brandt is currently in the action stage of change. As such, his goal is to continue with weight loss efforts. He has agreed to the Category 3 Plan.   Exercise goals: No exercise has been prescribed at this time.  Behavioral modification strategies: increasing lean protein intake, meal planning and cooking strategies, keeping healthy foods in the home and planning for success.  David Brandt has agreed to follow-up with our clinic in 2 weeks. He was informed of the importance of frequent follow-up visits to maximize his success with intensive lifestyle modifications for his multiple health conditions.   David Brandt was informed we would discuss his lab results at his next visit unless there is a critical issue that needs to be addressed sooner. David Brandt agreed to keep his next visit at the agreed upon time to discuss these results.  Objective:   Blood pressure 134/70, pulse 66, temperature 97.6 F (36.4 C), temperature source Oral, height 5\' 8"  (1.727 m), weight 296 lb (134.3 kg), SpO2 97 %. Body mass  index is 45.01 kg/m.  David Brandt: Cooperative, alert, well developed, in no acute distress. HEENT: Conjunctivae and lids unremarkable. Cardiovascular: Regular rhythm.  Lungs: Normal work of breathing. Neurologic: No focal deficits.   Lab Results  Component Value Date   CREATININE 1.12 07/25/2020   BUN 11 07/25/2020   NA 140 07/25/2020   K 3.8 07/25/2020   CL 98 07/25/2020   CO2 24 07/25/2020   Lab Results  Component  Value Date   ALT 19 07/25/2020   AST 23 07/25/2020   ALKPHOS 119 07/25/2020   BILITOT 0.6 07/25/2020   Lab Results  Component Value Date   HGBA1C 5.8 (A) 06/03/2020   HGBA1C 6.8 (H) 04/21/2019   HGBA1C 6.0 (A) 03/06/2018   HGBA1C 6.8 (H) 12/06/2017   HGBA1C 6.3 (H) 03/31/2016   Lab Results  Component Value Date   INSULIN 22.6 07/25/2020   Lab Results  Component Value Date   TSH 2.050 07/25/2020   Lab Results  Component Value Date   CHOL 115 07/25/2020   HDL 39 (L) 07/25/2020   LDLCALC 44 07/25/2020   TRIG 194 (H) 07/25/2020   CHOLHDL 3.0 04/14/2020   Lab Results  Component Value Date   WBC 7.8 07/27/2020   HGB 20.1 (HH) 07/27/2020   HCT 59.9 (H) 07/27/2020   MCV 92 07/27/2020   PLT 195 07/27/2020   No results found for: IRON, TIBC, FERRITIN  Obesity Behavioral Intervention:   Approximately 15 minutes were spent on the discussion below.  ASK: We discussed the diagnosis of obesity with David Brandt today and David Brandt agreed to give [name] permission to discuss obesity behavioral modification therapy today.  ASSESS: David Brandt has the diagnosis of obesity and his BMI today is 45. David Brandt is in the action stage of change.   ADVISE: David Brandt was educated on the multiple health risks of obesity as well as the benefit of weight loss to improve his health. He was advised of the need for long term treatment and the importance of lifestyle modifications to improve his current health and to decrease his risk of future health problems.  AGREE: Multiple dietary modification options and treatment options were discussed and David Brandt agreed to follow the recommendations documented in the above note.  ARRANGE: David Brandt was educated on the importance of frequent visits to treat obesity as outlined per CMS and USPSTF guidelines and agreed to schedule his next follow up appointment today.  Attestation Statements:   Reviewed by clinician on day of visit: allergies, medications, problem list, medical  history, surgical history, family history, social history, and previous encounter notes.  David Brandt, am acting as transcriptionist for David Common, MD.   I have reviewed the above documentation for accuracy and completeness, and I agree with the above. - Jinny Blossom, MD

## 2020-08-10 LAB — TESTOSTERONE,FREE AND TOTAL
Testosterone, Free: 14.7 pg/mL (ref 6.6–18.1)
Testosterone: 841 ng/dL (ref 264–916)

## 2020-08-10 LAB — PSA: Prostate Specific Ag, Serum: 3 ng/mL (ref 0.0–4.0)

## 2020-08-12 DIAGNOSIS — N202 Calculus of kidney with calculus of ureter: Secondary | ICD-10-CM | POA: Diagnosis not present

## 2020-08-27 DIAGNOSIS — N200 Calculus of kidney: Secondary | ICD-10-CM | POA: Diagnosis not present

## 2020-08-29 ENCOUNTER — Other Ambulatory Visit: Payer: Self-pay

## 2020-08-29 ENCOUNTER — Encounter (INDEPENDENT_AMBULATORY_CARE_PROVIDER_SITE_OTHER): Payer: Self-pay | Admitting: Family Medicine

## 2020-08-29 ENCOUNTER — Ambulatory Visit (INDEPENDENT_AMBULATORY_CARE_PROVIDER_SITE_OTHER): Payer: Medicare PPO | Admitting: Family Medicine

## 2020-08-29 VITALS — BP 117/72 | HR 80 | Temp 98.0°F | Ht 68.0 in | Wt 284.0 lb

## 2020-08-29 DIAGNOSIS — I10 Essential (primary) hypertension: Secondary | ICD-10-CM | POA: Diagnosis not present

## 2020-08-29 DIAGNOSIS — Z6841 Body Mass Index (BMI) 40.0 and over, adult: Secondary | ICD-10-CM

## 2020-08-29 DIAGNOSIS — D751 Secondary polycythemia: Secondary | ICD-10-CM

## 2020-08-30 ENCOUNTER — Other Ambulatory Visit: Payer: Self-pay | Admitting: Cardiovascular Disease

## 2020-08-30 ENCOUNTER — Telehealth: Payer: Self-pay | Admitting: Hematology and Oncology

## 2020-08-30 NOTE — Progress Notes (Signed)
Subjective:   David Brandt is a 75 y.o. male who presents for an Initial Medicare Annual Wellness Visit.   Review of Systems    N/A  Cardiac Risk Factors include: advanced age (>78men, >39 women);male gender;diabetes mellitus;dyslipidemia;obesity (BMI >30kg/m2);hypertension     Objective:    Today's Vitals   08/31/20 1039 08/31/20 1040  BP: 110/62   Pulse: 73   Temp: 97.9 F (36.6 C)   TempSrc: Oral   SpO2: 95%   Weight: 291 lb 6 oz (132.2 kg)   Height: 5\' 8"  (1.727 m)   PainSc:  4    Body mass index is 44.3 kg/m.  Advanced Directives 08/31/2020 05/27/2019 05/25/2019 05/07/2019 05/06/2019 04/24/2019 04/21/2019  Does Patient Have a Medical Advance Directive? No - No No No No No  Would patient like information on creating a medical advance directive? No - Patient declined No - Guardian declined No - Patient declined No - Patient declined - No - Patient declined -    Current Medications (verified) Outpatient Encounter Medications as of 08/31/2020  Medication Sig   Alcohol Swabs (B-D SINGLE USE SWABS REGULAR) PADS Use as directed.   Apoaequorin (PREVAGEN PO) Take 20 mg by mouth.   aspirin EC 81 MG tablet Take 81 mg by mouth daily.   atorvastatin (LIPITOR) 80 MG tablet Take 1 tablet (80 mg total) by mouth daily at 6 PM.   Blood Glucose Calibration (TRUE METRIX LEVEL 1) Low SOLN Use as directed   Blood Glucose Monitoring Suppl (TRUE METRIX METER) DEVI Use as directed   carvedilol (COREG) 25 MG tablet TAKE 1 TABLET TWICE DAILY   celecoxib (CELEBREX) 200 MG capsule    Cholecalciferol (VITAMIN D3) 25 MCG (1000 UT) CAPS Take 2,000 Units by mouth daily.   felodipine (PLENDIL) 10 MG 24 hr tablet TAKE 1 TABLET BY MOUTH EVERY DAY   glucose blood (TRUE METRIX BLOOD GLUCOSE TEST) test strip Use as instructed   hydrALAZINE (APRESOLINE) 50 MG tablet Take 1 tablet (50 mg total) by mouth 2 (two) times daily.   hydrochlorothiazide (HYDRODIURIL) 25 MG tablet Take 25 mg by  mouth daily.   metFORMIN (GLUCOPHAGE-XR) 500 MG 24 hr tablet Take 1 tablet (500 mg total) by mouth daily.   OVER THE COUNTER MEDICATION Take 2 capsules by mouth 2 (two) times daily. OmegaXL   POTASSIUM CITRATE PO Take 15 mEq by mouth.   spironolactone (ALDACTONE) 25 MG tablet TAKE 1 TABLET EVERY DAY   SYMBICORT 160-4.5 MCG/ACT inhaler TAKE 2 PUFFS BY MOUTH TWICE A DAY   testosterone cypionate (DEPOTESTOSTERONE CYPIONATE) 200 MG/ML injection INJECT 1 ML INTRAMUSCULARLY EVERY 14 DAYS (Patient not taking: Reported on 08/31/2020)   [DISCONTINUED] spironolactone (ALDACTONE) 25 MG tablet TAKE 1 TABLET EVERY DAY   No facility-administered encounter medications on file as of 08/31/2020.    Allergies (verified) Allopurinol, Justicia adhatoda (malabar nut tree) [justicia adhatoda], Valsartan-hydrochlorothiazide, and Sulfa antibiotics   History: Past Medical History:  Diagnosis Date   Abnormal cardiovascular stress test 03/29/2016   Allergic rhinitis    Allergy    Arthritis    Asthma    Back pain    Diabetes ([place]) 03/29/2016   Gout    History of kidney stones    Hyperlipidemia    Hypertension    Joint pain    Multiple food allergies    Prediabetes    Sleep apnea    cpap   Past Surgical History:  Procedure Laterality Date   CARDIAC CATHETERIZATION N/A 03/29/2016  Procedure: Right/Left Heart Cath and Coronary Angiography;  Surgeon: Nelva Bush, MD;  Location: [facility];  Service: Cardiovascular;  Laterality: N/A;   CARDIAC CATHETERIZATION N/A 03/29/2016   Procedure: Intravascular Pressure Wire/FFR Study;  Surgeon: Nelva Bush, MD;  Location: [facility] [facility];  Service: Cardiovascular;  Laterality: N/A;   CARDIAC CATHETERIZATION Left 04/02/2016   Procedure: FEMORAL ARTERIAL LINE INSERTION;  Surgeon: [name], MD;  Location: [facility];  Service: Open Heart Surgery;  Laterality: Left;   COLONOSCOPY  02/20/2018   per Dr. 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   Procedure: CORONARY ARTERY BYPASS GRAFTING (CABG) X 5 UTILIZING LEFT INTERNAL MAMMARY ARTERY AND ENDOSCOPICALLY HARVESTED GREATER SAPHENOUS VEIN ; Mammary to LAD, Sequencial 1st to 2nd Diagonal, Vein to OM, Vein to Distal RCA.;  Surgeon: [name], MD;  Location: [facility];  Service: Open Heart Surgery;  Laterality: N/A;   CYSTOSCOPY/URETEROSCOPY/HOLMIUM LASER/STENT PLACEMENT Bilateral 05/07/2019   Procedure: LEFT URETEROSCOPY/HOLMIUM LASER/STENT PLACEMENT/BIOPSY LEFT MID URETER/RIGHT RETROGRADE STENT PLACEMENT ;  Surgeon: [name], MD;  Location: [facility];  Service: Urology;  Laterality: Bilateral;   CYSTOSCOPY/URETEROSCOPY/HOLMIUM LASER/STENT PLACEMENT Bilateral 05/27/2019   Procedure: CYSTOSCOPY, URETEROSCOPY/ RETROGRADE PYELOGRAM/ HOLMIUM LASER/STENT EXCHANGE;  Surgeon: [name], MD;  Location: [facility];  Service: Urology;  Laterality: Bilateral;   LYMPH NODE BIOPSY Left 04/02/2016   Procedure: INCIDENTAL LEFT MAMMARY LYMPH NODE BIOPSY;  Surgeon: [name], MD;  Location: [facility];  Service: Open Heart Surgery;  Laterality: Left;   mass abdomen  1955   NEPHROLITHOTOMY Left 04/24/2019   Procedure: NEPHROLITHOTOMY PERCUTANEOUS WITH SURGEON ACCESS;  Surgeon: [name], MD;  Location: [facility];  Service: Urology;  Laterality: Left;   TEE WITHOUT CARDIOVERSION N/A 04/02/2016   Procedure: TRANSESOPHAGEAL ECHOCARDIOGRAM (TEE);  Surgeon: [name], MD;  Location: [facility];  Service: Open Heart Surgery;  Laterality: N/A;   Family History  Problem Relation Age of Onset   Colon cancer Father 27   Cancer Father    Alcohol abuse Father    Stroke Mother    Diabetes Mother    Hypertension Mother    Hyperlipidemia Mother    Obesity Mother    Heart disease Maternal Aunt    Diabetes Maternal Uncle    Stomach cancer Neg Hx    Esophageal cancer Neg Hx    Rectal  cancer Neg Hx    Social History   Socioeconomic History   Marital status: Married    Spouse name: Hassan Rowan   Number of children: Y   Years of education: Not on file   Highest education level: Not on file  Occupational History   Not on file  Tobacco Use   Smoking status: Former Smoker    Packs/day: 1.00    Years: 47.00    Pack years: 47.00    Types: Cigarettes    Quit date: 06/25/2018    Years since quitting: 2.1   Smokeless tobacco: Never Used  Vaping Use   Vaping Use: Never used  Substance and Sexual Activity   Alcohol use: Yes    Alcohol/week: 4.0 standard drinks    Types: 4 Cans of beer per week   Drug use: Yes    Frequency: 7.0 times per week    Types: Marijuana    Comment: marijuana-states none recently on 05/06/2019   Sexual activity: Not on file  Other Topics Concern  Not on file  Social History Narrative   Not on file   Social Determinants of Health   Financial Resource Strain: Low Risk    Difficulty of Paying Living Expenses: Not hard at all  Food Insecurity: No Food Insecurity   Worried About Charity fundraiser in the Last Year: Never true   Arboriculturist in the Last Year: Never true  Transportation Needs: No Transportation Needs   Lack of Transportation (Medical): No   Lack of Transportation (Non-Medical): No  Physical Activity: Insufficiently Active   Days of Exercise per Week: 2 days   Minutes of Exercise per Session: 60 min  Stress: No Stress Concern Present   Feeling of Stress : Not at all  Social Connections: Moderately Integrated   Frequency of Communication with Friends and Family: More than three times a week   Frequency of Social Gatherings with Friends and Family: More than three times a week   Attends Religious Services: 1 to 4 times per year   Active Member of [employer] or Organizations: No   Attends Archivist Meetings: Never   Marital Status: Married    Tobacco Counseling Counseling given: Not  Answered   Clinical Intake:  Pre-visit preparation completed: Yes  Pain : 0-10 Pain Score: 4  Pain Type: Chronic pain Pain Orientation: Lower Pain Descriptors / Indicators: Throbbing Pain Onset: More than a month ago Pain Frequency: Constant Pain Relieving Factors: Smoking marijuna  Pain Relieving Factors: Smoking marijuna  Nutritional Risks: None Diabetes: Yes CBG done?: No Did pt. bring in CBG monitor from home?: No  How often do you need to have someone help you when you read instructions, pamphlets, or other written materials from your doctor or pharmacy?: 1 - Never What is the last grade level you completed in school?: high school  Diabetic? Yes Nutrition Risk Assessment:  Has the patient had any N/V/D within the last 2 months?  No  Does the patient have any non-healing wounds?  No  Has the patient had any unintentional weight loss or weight gain?  No   Diabetes:  Is the patient diabetic?  Yes  If diabetic, was a CBG obtained today?  No  Did the patient bring in their glucometer from home?  No  How often do you monitor your CBG's? States checks glucose every now and then .   Financial Strains and Diabetes Management:  Are you having any financial strains with the device, your supplies or your medication? No .  Does the patient want to be seen by Chronic Care Management for management of their diabetes?  No  Would the patient like to be referred to a Nutritionist or for Diabetic Management?  No   Diabetic Exams:  Diabetic Eye Exam: Overdue for diabetic eye exam. Pt has been advised about the importance in completing this exam. Patient advised to call and schedule an eye exam. Diabetic Foot Exam: Overdue, Pt has been advised about the importance in completing this exam. Pt is scheduled for diabetic foot exam on next scheduled .   Interpreter Needed?: No  Information entered by :: [place] of Daily Living In your present state of health, do  you have any difficulty performing the following activities: 08/31/2020  Hearing? N  Vision? N  Difficulty concentrating or making decisions? N  Walking or climbing stairs? N  Dressing or bathing? N  Doing errands, shopping? N  Preparing Food and eating ? N  Using the Toilet? N  In the past six months, have you accidently leaked urine? N  Do you have problems with loss of bowel control? N  Managing your Medications? N  Managing your Finances? N  Housekeeping or managing your Housekeeping? N  Some recent data might be hidden    Patient Care Team: [name], MD as PCP - General (Family Medicine) Skeet Latch, MD as PCP - Cardiology (Cardiology) Clark-Burning, Anderson Malta, PA-C (Inactive) (Dermatology)  Indicate any recent Medical Services you may have received from other than [facility] providers in the past year (date may be approximate).     Assessment:   This is a routine wellness examination for [place].  Hearing/Vision screen  Hearing Screening   125Hz  250Hz  500Hz  1000Hz  2000Hz  3000Hz  4000Hz  6000Hz  8000Hz   Right ear:           Left ear:           Vision Screening Comments: Patient states gets his eyes examined once per year. Has glasses for reading   Dietary issues and exercise activities discussed: Current Exercise Habits: Home exercise routine, Type of exercise: strength training/weights, Time (Minutes): 60, Frequency (Times/Week): 2, Weekly Exercise (Minutes/Week): 120, Intensity: Mild, Exercise limited by: orthopedic condition(s)  Goals     Patient Stated     I would like to get rid of my back pain so I can get back to the things I used to do.       Depression Screen PHQ 2/9 Scores 08/31/2020 07/25/2020 06/03/2020 12/04/2017 07/10/2016 06/26/2016 06/06/2016  PHQ - 2 Score 0 0 0 0 0 0 0  PHQ- 9 Score 0 7 - - - - -    Fall Risk Fall Risk  08/31/2020 06/03/2020 12/04/2017 07/10/2016 05/29/2016  Falls in the past year? 0 0 No No No  Number falls in past yr: 0 0 - - -   Injury with Fall? 0 0 - - -  Risk for fall due to : Orthopedic patient - - - Other (Comment)  Risk for fall due to: Comment - - - - High fall risk based on results of single leg stand.  Follow up Falls evaluation completed;Falls prevention discussed - - - -    FALL RISK PREVENTION PERTAINING TO THE HOME:  Any stairs in or around the home? No  If so, are there any without handrails? No  Home free of loose throw rugs in walkways, pet beds, electrical cords, etc? Yes  Adequate lighting in your home to reduce risk of falls? Yes   ASSISTIVE DEVICES UTILIZED TO PREVENT FALLS:  Life alert? No  Use of a cane, walker or w/c? No  Grab bars in the bathroom? No  Shower chair or bench in shower? No  Elevated toilet seat or a handicapped toilet? No   TIMED UP AND GO:  Was the test performed? Yes .  Length of time to ambulate 10 feet: 5 sec.   Gait steady and fast without use of assistive device  Cognitive Function:   Normal cognitive status assessed by direct observation by this Nurse Health Advisor. No abnormalities found.        Immunizations Immunization History  Administered Date(s) Administered   Influenza Split 04/06/2015   Influenza, High Dose Seasonal PF 03/06/2018, 02/15/2019, 02/15/2019   Influenza,inj,Quad PF,6+ Mos 05/25/2013   Influenza-Unspecified 03/25/2014, 02/23/2020, 03/08/2020   Moderna Sars-Covid-2 Vaccination 08/12/2019, 08/31/2019, 03/08/2020   PFIZER(Purple Top)SARS-COV-2 Vaccination 02/23/2020   Pneumococcal Conjugate-13 04/28/2019   Pneumococcal Polysaccharide-23 04/11/2016   Zoster Recombinat (Shingrix) 03/26/2017  TDAP status: Due, Education has been provided regarding the importance of this vaccine. Advised may receive this vaccine at local pharmacy or Health Dept. Aware to provide a copy of the vaccination record if obtained from local pharmacy or Health Dept. Verbalized acceptance and understanding.  Flu Vaccine status: Up to  date  Pneumococcal vaccine status: Up to date  Covid-19 vaccine status: Completed vaccines  Qualifies for Shingles Vaccine? Yes   Zostavax completed No   Shingrix Completed?: Yes  Screening Tests Health Maintenance  Topic Date Due   Hepatitis C Screening  Never done   FOOT EXAM  Never done   TETANUS/TDAP  Never done   OPHTHALMOLOGY EXAM  03/30/2020   COVID-19 Vaccine (4 - Booster) 09/05/2020   HEMOGLOBIN A1C  12/02/2020   COLONOSCOPY (Pts 45-58yrs Insurance coverage will need to be confirmed)  02/21/2023   INFLUENZA VACCINE  Completed   PNA vac Low Risk Adult  Completed   HPV VACCINES  Aged Out    Health Maintenance  Health Maintenance Due  Topic Date Due   Hepatitis C Screening  Never done   FOOT EXAM  Never done   TETANUS/TDAP  Never done   OPHTHALMOLOGY EXAM  03/30/2020    Colorectal cancer screening: Type of screening: Colonoscopy. Completed 02/20/2018. Repeat every 5 years  Lung Cancer Screening: (Low Dose CT Chest recommended if Age 72-80 years, 30 pack-year currently smoking OR have quit w/in 15years.) does qualify.   Lung Cancer Screening Referral: Yes  Additional Screening:  Hepatitis C Screening: does qualify;   Vision Screening: Recommended annual ophthalmology exams for early detection of glaucoma and other disorders of the eye. Is the patient up to date with their annual eye exam?  Yes  Who is the provider or what is the name of the office in which the patient attends annual eye exams? [facility]  If pt is not established with a provider, would they like to be referred to a provider to establish care? No .   Dental Screening: Recommended annual dental exams for proper oral hygiene  Community Resource Referral / Chronic Care Management: CRR required this visit?  No   CCM required this visit?  No      Plan:     I have personally reviewed and noted the following in the patients chart:    Medical and social history  Use  of alcohol, tobacco or illicit drugs   Current medications and supplements  Functional ability and status  Nutritional status  Physical activity  Advanced directives  List of other physicians  Hospitalizations, surgeries, and ER visits in previous 12 months  Vitals  Screenings to include cognitive, depression, and falls  Referrals and appointments  In addition, I have reviewed and discussed with patient certain preventive protocols, quality metrics, and best practice recommendations. A written personalized care plan for preventive services as well as general preventive health recommendations were provided to patient.     Ofilia Neas, LPN   02/28/931   Nurse Notes: None

## 2020-08-30 NOTE — Telephone Encounter (Signed)
Received a new hem referral from [facility] for polycythemia. David Brandt has been cld and scheduled to see Dr. Chryl Heck on 3/17 at 10:40am. Pt aware to arrive 20 minutes early.

## 2020-08-31 ENCOUNTER — Ambulatory Visit (INDEPENDENT_AMBULATORY_CARE_PROVIDER_SITE_OTHER): Payer: Medicare PPO

## 2020-08-31 ENCOUNTER — Other Ambulatory Visit: Payer: Self-pay

## 2020-08-31 VITALS — BP 110/62 | HR 73 | Temp 97.9°F | Ht 68.0 in | Wt 291.4 lb

## 2020-08-31 DIAGNOSIS — Z0000 Encounter for general adult medical examination without abnormal findings: Secondary | ICD-10-CM | POA: Diagnosis not present

## 2020-08-31 DIAGNOSIS — Z87891 Personal history of nicotine dependence: Secondary | ICD-10-CM

## 2020-08-31 NOTE — Progress Notes (Signed)
Chief Complaint:   OBESITY David Brandt is here to discuss his progress with his obesity treatment plan along with follow-up of his obesity related diagnoses. David Brandt is on the Category 3 Plan and states he is following his eating plan approximately 90-95% of the time. David Brandt states he is going to the gym 60 minutes 1 times per week.  Today's visit was #: 3 Starting weight: 302 lbs Starting date: 07/25/2020 Today's weight: 284 lbs Today's date: 08/29/2020 Total lbs lost to date: 18 lbs Total lbs lost since last in-office visit: 12 lbs  Interim History: David Brandt is getting more used to the meal plan. His wife thinks he is eating too late during the day. He is doing 2 egg sandwich, cold cut sandwich at lunch with yogurt and fruit at night. Dinner is protein and vegetables. He denies hunger. He has an occasional smoothie in the morning. Her has a sweet tooth so often, he is eating a few jelly beans at the end of meals.  Subjective:   1. Polycythemia David Brandt has elevated hemoglobin, hematocrit, and RBC. He is on testosterone. Elevations from last year.  2. Essential hypertension David Brandt's BP is very controlled today. He denies chest pain, chest pressure and headache.  BP Readings from Last 3 Encounters:  08/31/20 110/62  08/29/20 117/72  08/08/20 134/70    Assessment/Plan:   1. Polycythemia Refer to hematology for further assessment and evaluation.  - Ambulatory referral to Hematology  2. Essential hypertension David Brandt is working on healthy weight loss and exercise to improve blood pressure control. We will watch for signs of hypotension as he continues his lifestyle modifications. Continue current treatment plan.  3. Class 3 severe obesity with serious comorbidity and body mass index (BMI) of 40.0 to 44.9 in adult, unspecified obesity type (HCC) David Brandt is currently in the action stage of change. As such, his goal is to continue with weight loss efforts. He has agreed to the Category 3  Plan.   Exercise goals: As is  Behavioral modification strategies: increasing lean protein intake and meal planning and cooking strategies.  David Brandt has agreed to follow-up with our clinic in 2.5 weeks. He was informed of the importance of frequent follow-up visits to maximize his success with intensive lifestyle modifications for his multiple health conditions.   Objective:   Blood pressure 117/72, pulse 80, temperature 98 F (36.7 C), temperature source Oral, height 5\' 8"  (1.727 m), weight 284 lb (128.8 kg), SpO2 98 %. Body mass index is 43.18 kg/m.  General: Cooperative, alert, well developed, in no acute distress. HEENT: Conjunctivae and lids unremarkable. Cardiovascular: Regular rhythm.  Lungs: Normal work of breathing. Neurologic: No focal deficits.   Lab Results  Component Value Date   CREATININE 1.12 07/25/2020   BUN 11 07/25/2020   NA 140 07/25/2020   K 3.8 07/25/2020   CL 98 07/25/2020   CO2 24 07/25/2020   Lab Results  Component Value Date   ALT 19 07/25/2020   AST 23 07/25/2020   ALKPHOS 119 07/25/2020   BILITOT 0.6 07/25/2020   Lab Results  Component Value Date   HGBA1C 5.8 (A) 06/03/2020   HGBA1C 6.8 (H) 04/21/2019   HGBA1C 6.0 (A) 03/06/2018   HGBA1C 6.8 (H) 12/06/2017   HGBA1C 6.3 (H) 03/31/2016   Lab Results  Component Value Date   INSULIN 22.6 07/25/2020   Lab Results  Component Value Date   TSH 2.050 07/25/2020   Lab Results  Component Value Date   CHOL 115  07/25/2020   HDL 39 (L) 07/25/2020   LDLCALC 44 07/25/2020   TRIG 194 (H) 07/25/2020   CHOLHDL 3.0 04/14/2020   Lab Results  Component Value Date   WBC 7.8 07/27/2020   HGB 20.1 (HH) 07/27/2020   HCT 59.9 (H) 07/27/2020   MCV 92 07/27/2020   PLT 195 07/27/2020   No results found for: IRON, TIBC, FERRITIN  Obesity Behavioral Intervention:   Approximately 15 minutes were spent on the discussion below.  ASK: We discussed the diagnosis of obesity with David Brandt today and David Brandt  agreed to give [name] permission to discuss obesity behavioral modification therapy today.  ASSESS: David Brandt has the diagnosis of obesity and his BMI today is 43.3. Areon is in the action stage of change.   ADVISE: David Brandt was educated on the multiple health risks of obesity as well as the benefit of weight loss to improve his health. He was advised of the need for long term treatment and the importance of lifestyle modifications to improve his current health and to decrease his risk of future health problems.  AGREE: Multiple dietary modification options and treatment options were discussed and David Brandt agreed to follow the recommendations documented in the above note.  ARRANGE: David Brandt was educated on the importance of frequent visits to treat obesity as outlined per CMS and USPSTF guidelines and agreed to schedule his next follow up appointment today.  Attestation Statements:   Reviewed by clinician on day of visit: allergies, medications, problem list, medical history, surgical history, family history, social history, and previous encounter notes.  Coral Ceo, am acting as transcriptionist for Coralie Common, MD.   I have reviewed the above documentation for accuracy and completeness, and I agree with the above. - Jinny Blossom, MD

## 2020-08-31 NOTE — Patient Instructions (Signed)
[name] , Thank you for taking time to come for your Medicare Wellness Visit. I appreciate your ongoing commitment to your health goals. Please review the following plan we discussed and let me know if I can assist you in the future.   Screening recommendations/referrals: Colonoscopy: Up to date, next due 02/21/2023 Recommended yearly ophthalmology/optometry visit for glaucoma screening and checkup Recommended yearly dental visit for hygiene and checkup  Vaccinations: Influenza vaccine: Up to date, next due fall 2022  Pneumococcal vaccine: Completed series  Tdap vaccine: Currently due, you may await and injury to receive  Shingles vaccine: Completed series     Advanced directives: Advance directive discussed with you today. Even though you declined this today please call our office should you change your mind and we can give you the proper paperwork for you to fill out.   Conditions/risks identified: None   Next appointment: None   Preventive Care 65 Years and Older, Male Preventive care refers to lifestyle choices and visits with your health care provider that can promote health and wellness. What does preventive care include?  A yearly physical exam. This is also called an annual well check.  Dental exams once or twice a year.  Routine eye exams. Ask your health care provider how often you should have your eyes checked.  Personal lifestyle choices, including:  Daily care of your teeth and gums.  Regular physical activity.  Eating a healthy diet.  Avoiding tobacco and drug use.  Limiting alcohol use.  Practicing safe sex.  Taking low doses of aspirin every day.  Taking vitamin and mineral supplements as recommended by your health care provider. What happens during an annual well check? The services and screenings done by your health care provider during your annual well check will depend on your age, overall health, lifestyle risk factors, and family history  of disease. Counseling  Your health care provider may ask you questions about your:  Alcohol use.  Tobacco use.  Drug use.  Emotional well-being.  Home and relationship well-being.  Sexual activity.  Eating habits.  History of falls.  Memory and ability to understand (cognition).  Work and work Statistician. Screening  You may have the following tests or measurements:  Height, weight, and BMI.  Blood pressure.  Lipid and cholesterol levels. These may be checked every 5 years, or more frequently if you are over 56 years old.  Skin check.  Lung cancer screening. You may have this screening every year starting at age 12 if you have a 30-pack-year history of smoking and currently smoke or have quit within the past 15 years.  Fecal occult blood test (FOBT) of the stool. You may have this test every year starting at age 78.  Flexible sigmoidoscopy or colonoscopy. You may have a sigmoidoscopy every 5 years or a colonoscopy every 10 years starting at age 91.  Prostate cancer screening. Recommendations will vary depending on your family history and other risks.  Hepatitis C blood test.  Hepatitis B blood test.  Sexually transmitted disease (STD) testing.  Diabetes screening. This is done by checking your blood sugar (glucose) after you have not eaten for a while (fasting). You may have this done every 1-3 years.  Abdominal aortic aneurysm (AAA) screening. You may need this if you are a current or former smoker.  Osteoporosis. You may be screened starting at age 26 if you are at high risk. Talk with your health care provider about your test results, treatment options, and if necessary,  the need for more tests. Vaccines  Your health care provider may recommend certain vaccines, such as:  Influenza vaccine. This is recommended every year.  Tetanus, diphtheria, and acellular pertussis (Tdap, Td) vaccine. You may need a Td booster every 10 years.  Zoster vaccine. You may  need this after age 90.  Pneumococcal 13-valent conjugate (PCV13) vaccine. One dose is recommended after age 66.  Pneumococcal polysaccharide (PPSV23) vaccine. One dose is recommended after age 12. Talk to your health care provider about which screenings and vaccines you need and how often you need them. This information is not intended to replace advice given to you by your health care provider. Make sure you discuss any questions you have with your health care provider. Document Released: 07/08/2015 Document Revised: 02/29/2016 Document Reviewed: 04/12/2015 Elsevier Interactive Patient Education  2017 [place] Prevention in the Home Falls can cause injuries. They can happen to people of all ages. There are many things you can do to make your home safe and to help prevent falls. What can I do on the outside of my home?  Regularly fix the edges of walkways and driveways and fix any cracks.  Remove anything that might make you trip as you walk through a door, such as a raised step or threshold.  Trim any bushes or trees on the path to your home.  Use bright outdoor lighting.  Clear any walking paths of anything that might make someone trip, such as rocks or tools.  Regularly check to see if handrails are loose or broken. Make sure that both sides of any steps have handrails.  Any raised decks and porches should have guardrails on the edges.  Have any leaves, snow, or ice cleared regularly.  Use sand or salt on walking paths during winter.  Clean up any spills in your garage right away. This includes oil or grease spills. What can I do in the bathroom?  Use night lights.  Install grab bars by the toilet and in the tub and shower. Do not use towel bars as grab bars.  Use non-skid mats or decals in the tub or shower.  If you need to sit down in the shower, use a plastic, non-slip stool.  Keep the floor dry. Clean up any water that spills on the floor as soon as it  happens.  Remove soap buildup in the tub or shower regularly.  Attach bath mats securely with double-sided non-slip rug tape.  Do not have throw rugs and other things on the floor that can make you trip. What can I do in the bedroom?  Use night lights.  Make sure that you have a light by your bed that is easy to reach.  Do not use any sheets or blankets that are too big for your bed. They should not hang down onto the floor.  Have a firm chair that has side arms. You can use this for support while you get dressed.  Do not have throw rugs and other things on the floor that can make you trip. What can I do in the kitchen?  Clean up any spills right away.  Avoid walking on wet floors.  Keep items that you use a lot in easy-to-reach places.  If you need to reach something above you, use a strong step stool that has a grab bar.  Keep electrical cords out of the way.  Do not use floor polish or wax that makes floors slippery. If you must use  wax, use non-skid floor wax.  Do not have throw rugs and other things on the floor that can make you trip. What can I do with my stairs?  Do not leave any items on the stairs.  Make sure that there are handrails on both sides of the stairs and use them. Fix handrails that are broken or loose. Make sure that handrails are as long as the stairways.  Check any carpeting to make sure that it is firmly attached to the stairs. Fix any carpet that is loose or worn.  Avoid having throw rugs at the top or bottom of the stairs. If you do have throw rugs, attach them to the floor with carpet tape.  Make sure that you have a light switch at the top of the stairs and the bottom of the stairs. If you do not have them, ask someone to add them for you. What else can I do to help prevent falls?  Wear shoes that:  Do not have high heels.  Have rubber bottoms.  Are comfortable and fit you well.  Are closed at the toe. Do not wear sandals.  If you  use a stepladder:  Make sure that it is fully opened. Do not climb a closed stepladder.  Make sure that both sides of the stepladder are locked into place.  Ask someone to hold it for you, if possible.  Clearly mark and make sure that you can see:  Any grab bars or handrails.  First and last steps.  Where the edge of each step is.  Use tools that help you move around (mobility aids) if they are needed. These include:  Canes.  Walkers.  Scooters.  Crutches.  Turn on the lights when you go into a dark area. Replace any light bulbs as soon as they burn out.  Set up your furniture so you have a clear path. Avoid moving your furniture around.  If any of your floors are uneven, fix them.  If there are any pets around you, be aware of where they are.  Review your medicines with your doctor. Some medicines can make you feel dizzy. This can increase your chance of falling. Ask your doctor what other things that you can do to help prevent falls. This information is not intended to replace advice given to you by your health care provider. Make sure you discuss any questions you have with your health care provider. Document Released: 04/07/2009 Document Revised: 11/17/2015 Document Reviewed: 07/16/2014 Elsevier Interactive Patient Education  2017 [employer].

## 2020-09-07 ENCOUNTER — Other Ambulatory Visit: Payer: Self-pay | Admitting: *Deleted

## 2020-09-07 DIAGNOSIS — Z87891 Personal history of nicotine dependence: Secondary | ICD-10-CM

## 2020-09-08 ENCOUNTER — Telehealth (INDEPENDENT_AMBULATORY_CARE_PROVIDER_SITE_OTHER): Payer: Self-pay

## 2020-09-08 ENCOUNTER — Inpatient Hospital Stay: Payer: Medicare PPO | Attending: Hematology and Oncology | Admitting: Hematology and Oncology

## 2020-09-08 ENCOUNTER — Other Ambulatory Visit: Payer: Self-pay

## 2020-09-08 ENCOUNTER — Inpatient Hospital Stay: Payer: Medicare PPO

## 2020-09-08 ENCOUNTER — Encounter: Payer: Self-pay | Admitting: Hematology and Oncology

## 2020-09-08 ENCOUNTER — Telehealth: Payer: Self-pay | Admitting: Hematology and Oncology

## 2020-09-08 VITALS — BP 133/84 | HR 60 | Temp 97.0°F | Resp 17 | Ht 68.0 in | Wt 292.2 lb

## 2020-09-08 DIAGNOSIS — D751 Secondary polycythemia: Secondary | ICD-10-CM

## 2020-09-08 DIAGNOSIS — E291 Testicular hypofunction: Secondary | ICD-10-CM | POA: Diagnosis not present

## 2020-09-08 DIAGNOSIS — I10 Essential (primary) hypertension: Secondary | ICD-10-CM | POA: Insufficient documentation

## 2020-09-08 DIAGNOSIS — E119 Type 2 diabetes mellitus without complications: Secondary | ICD-10-CM | POA: Diagnosis not present

## 2020-09-08 DIAGNOSIS — Z87891 Personal history of nicotine dependence: Secondary | ICD-10-CM | POA: Insufficient documentation

## 2020-09-08 DIAGNOSIS — G4733 Obstructive sleep apnea (adult) (pediatric): Secondary | ICD-10-CM

## 2020-09-08 DIAGNOSIS — E669 Obesity, unspecified: Secondary | ICD-10-CM | POA: Diagnosis not present

## 2020-09-08 LAB — CMP (CANCER CENTER ONLY)
ALT: 24 U/L (ref 0–44)
AST: 20 U/L (ref 15–41)
Albumin: 3.9 g/dL (ref 3.5–5.0)
Alkaline Phosphatase: 88 U/L (ref 38–126)
Anion gap: 7 (ref 5–15)
BUN: 20 mg/dL (ref 8–23)
CO2: 28 mmol/L (ref 22–32)
Calcium: 9.6 mg/dL (ref 8.9–10.3)
Chloride: 98 mmol/L (ref 98–111)
Creatinine: 1.21 mg/dL (ref 0.61–1.24)
GFR, Estimated: >60 mL/min (ref >60)
Glucose, Bld: 112 mg/dL — ABNORMAL HIGH (ref 70–99)
Potassium: 4.5 mmol/L (ref 3.5–5.1)
Sodium: 133 mmol/L — ABNORMAL LOW (ref 135–145)
Total Bilirubin: 1 mg/dL (ref 0.3–1.2)
Total Protein: 7.4 g/dL (ref 6.5–8.1)

## 2020-09-08 LAB — CBC WITH DIFFERENTIAL/PLATELET
Abs Immature Granulocytes: 0.03 10*3/uL (ref 0.00–0.07)
Basophils Absolute: 0.1 10*3/uL (ref 0.0–0.1)
Basophils Relative: 1 %
Eosinophils Absolute: 0.2 10*3/uL (ref 0.0–0.5)
Eosinophils Relative: 2 %
HCT: 63 % — ABNORMAL HIGH (ref 39.0–52.0)
Hemoglobin: 20.1 g/dL — ABNORMAL HIGH (ref 13.0–17.0)
Immature Granulocytes: 0 %
Lymphocytes Relative: 14 %
Lymphs Abs: 1.4 10*3/uL (ref 0.7–4.0)
MCH: 30 pg (ref 26.0–34.0)
MCHC: 31.9 g/dL (ref 30.0–36.0)
MCV: 94 fL (ref 80.0–100.0)
Monocytes Absolute: 1.1 10*3/uL — ABNORMAL HIGH (ref 0.1–1.0)
Monocytes Relative: 11 %
Neutro Abs: 7.2 10*3/uL (ref 1.7–7.7)
Neutrophils Relative %: 72 %
Platelets: 157 10*3/uL (ref 150–400)
RBC: 6.7 MIL/uL — ABNORMAL HIGH (ref 4.22–5.81)
RDW: 14.6 % (ref 11.5–15.5)
WBC: 10 10*3/uL (ref 4.0–10.5)
nRBC: 0 % (ref 0.0–0.2)

## 2020-09-08 NOTE — Telephone Encounter (Signed)
Scheduled follow-up appointments per 3/17 los. Patient is aware. ?

## 2020-09-08 NOTE — Telephone Encounter (Signed)
Patient states that Dr. Jearld Shines sent him to the [facility] for some blood work.  He states that he was told he needed a lobotomy.  Patient wants to know what this is/means.  Please advise.  Thank you

## 2020-09-08 NOTE — Progress Notes (Signed)
[facility] NOTE  Patient Care Team: David Morale, MD as PCP - General (Family Medicine) David Latch, MD as PCP - Cardiology (Cardiology) David Brandt, David Malta, PA-C (Inactive) (Dermatology)  CHIEF COMPLAINTS/PURPOSE OF CONSULTATION:  Polycythemia, secondary  ASSESSMENT & PLAN:  No problem-specific Assessment & Plan notes found for this encounter.  Orders Placed This Encounter  Procedures  . CBC with Differential/Platelet    Standing Status:   Standing    Number of Occurrences:   22    Standing Expiration Date:   09/08/2021  . CMP ([facility] only)    Standing Status:   Future    Number of Occurrences:   1    Standing Expiration Date:   09/08/2021  . Pathologist smear review    Standing Status:   Future    Number of Occurrences:   1    Standing Expiration Date:   09/08/2021   #1.  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  We have discussed about classification of polycythemia to polycythemia vera and secondary polycythemia.  Given chronic polycythemia and since he has been using testosterone injections, I wonder if this is indeed secondary polycythemia from testosterone injections.  I recommended to repeat CBC today since his last testosterone dose was about a month ago.  CBC shows persistent elevation of hemoglobin and hematocrit.  We discussed about therapeutic phlebotomy versus blood donation for immediate correction of the polycythemia patient was leaning towards therapeutic phlebotomy.  I discussed increased risk of thromboembolic events and cardiovascular events with polycythemia. He does not appear to have any hyperviscosity symptoms at this time. We will proceed with therapeutic phlebotomy, I have asked him to refrain from testosterone  injections and return to clinic for 4 weeks.  If he continues to have polycythemia at my next visit, we will proceed with myeloproliferative work-up  2.  Obesity Encouraged weight loss, patient is participating in a weight loss program and has been successfully losing weight.  3.  Age-appropriate cancer screening recommended.  4.  Hypogonadism in male, patient has been using testosterone injections for the past 7 to 8 years.  5.  OSA on CPAP, patient diligent about using it every day.  HISTORY OF PRESENTING ILLNESS:  David Brandt 75 y.o. male is here because of polycythemia.  This is a very pleasant 75 year old male patient with past medical history significant for diabetes, obesity, hypertension, dyslipidemia, obstructive sleep apnea who is referred to hematology for evaluation of secondary polycythemia.  David Brandt arrived to the appointment today by himself.  He is part of the weight loss clinic and has been successfully losing weight at this time.  He tells me that he has been very compliant with his CPAP and continues to use it daily.  He has been taking testosterone injections for the past 7 to 8years and his last dose was on February 15.  He does remember being told that he has high hemoglobin in the past.  He denies any fevers, drenching night sweats, loss of appetite or loss of weight.  No thromboembolic episodes.  No erythromelalgia, intractable pruritus.  He denies any headaches, blurred vision, chest pain or chest pressure or other bleeding symptoms concerning for hyperviscosity symptoms. He feels well overall.  Rest of the pertinent 10 point ROS reviewed and negative.  REVIEW OF SYSTEMS:   Constitutional: Denies fevers, chills or abnormal night  sweats Eyes: Denies blurriness of vision, double vision or watery eyes Ears, nose, mouth, throat, and face: Denies mucositis or sore throat Respiratory: Denies cough, dyspnea or wheezes Cardiovascular: Denies palpitation, chest  discomfort or lower extremity swelling Gastrointestinal:  Denies nausea, heartburn or change in bowel habits Skin: Denies abnormal skin rashes Lymphatics: Denies new lymphadenopathy or easy bruising Neurological:Denies numbness, tingling or new weaknesses Behavioral/Psych: Mood is stable, no new changes  All other systems were reviewed with the patient and are negative.  MEDICAL HISTORY:  Past Medical History:  Diagnosis Date  . Abnormal cardiovascular stress test 03/29/2016  . Allergic rhinitis   . Allergy   . Arthritis   . Asthma   . Back pain   . Diabetes ([place]) 03/29/2016  . Gout   . History of kidney stones   . Hyperlipidemia   . Hypertension   . Joint pain   . Multiple food allergies   . Prediabetes   . Sleep apnea    cpap    SURGICAL HISTORY: Past Surgical History:  Procedure Laterality Date  . CARDIAC CATHETERIZATION N/A 03/29/2016   Procedure: Right/Left Heart Cath and Coronary Angiography;  Surgeon: Nelva Bush, MD;  Location: [facility];  Service: Cardiovascular;  Laterality: N/A;  . CARDIAC CATHETERIZATION N/A 03/29/2016   Procedure: Intravascular Pressure Wire/FFR Study;  Surgeon: Nelva Bush, MD;  Location: [facility];  Service: Cardiovascular;  Laterality: N/A;  . CARDIAC CATHETERIZATION Left 04/02/2016   Procedure: FEMORAL ARTERIAL LINE INSERTION;  Surgeon: [name], MD;  Location: [facility];  Service: Open Heart Surgery;  Laterality: Left;  . COLONOSCOPY  02/20/2018   per Dr. Carlean Purl, no polyps, repeat in 5 yrs (previous adenomatous polyps)   . 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  CYSTOSCOPY/URETEROSCOPY/HOLMIUM LASER/STENT PLACEMENT Bilateral 05/07/2019   Procedure: LEFT URETEROSCOPY/HOLMIUM LASER/STENT PLACEMENT/BIOPSY LEFT MID URETER/RIGHT RETROGRADE STENT PLACEMENT ;  Surgeon: [name], MD;  Location: [facility];  Service: Urology;  Laterality: Bilateral;  . CYSTOSCOPY/URETEROSCOPY/HOLMIUM LASER/STENT PLACEMENT Bilateral 05/27/2019   Procedure: CYSTOSCOPY, URETEROSCOPY/ RETROGRADE PYELOGRAM/ HOLMIUM LASER/STENT EXCHANGE;  Surgeon: [name], MD;  Location: [facility];  Service: Urology;  Laterality: Bilateral;  . LYMPH NODE BIOPSY Left 04/02/2016   Procedure: INCIDENTAL LEFT MAMMARY LYMPH NODE BIOPSY;  Surgeon: [name], MD;  Location: [facility];  Service: Open Heart Surgery;  Laterality: Left;  . mass abdomen  1955  . NEPHROLITHOTOMY Left 04/24/2019   Procedure: NEPHROLITHOTOMY PERCUTANEOUS WITH SURGEON ACCESS;  Surgeon: [name], MD;  Location: [facility];  Service: Urology;  Laterality: Left;  . TEE WITHOUT CARDIOVERSION N/A 04/02/2016   Procedure: TRANSESOPHAGEAL ECHOCARDIOGRAM (TEE);  Surgeon: [name], MD;  Location: [facility];  Service: Open Heart Surgery;  Laterality: N/A;    SOCIAL HISTORY: Social History   Socioeconomic History  . Marital status: Married    Spouse name: Hassan Rowan  . Number of children: Y  . Years of education: Not on file  . Highest education level: Not on file  Occupational History  . Not on file  Tobacco Use  . Smoking status: Former Smoker    Packs/day: 1.00    Years:  47.00    Pack years: 47.00    Types: Cigarettes    Quit date: 06/25/2018    Years since quitting: 2.2  . Smokeless tobacco: Never Used  Vaping Use  . Vaping Use: Never used  Substance and Sexual Activity  . Alcohol use: Yes    Alcohol/week: 4.0 standard drinks    Types: 4 Cans of beer per week  . Drug use: Yes    Frequency: 7.0 times per week    Types: Marijuana    Comment: marijuana-states none recently on 05/06/2019  . Sexual  activity: Not on file  Other Topics Concern  . Not on file  Social History Narrative  . Not on file   Social Determinants of Health   Financial Resource Strain: Low Risk   . Difficulty of Paying Living Expenses: Not hard at all  Food Insecurity: No Food Insecurity  . Worried About Charity fundraiser in the Last Year: Never true  . Ran Out of Food in the Last Year: Never true  Transportation Needs: No Transportation Needs  . Lack of Transportation (Medical): No  . Lack of Transportation (Non-Medical): No  Physical Activity: Insufficiently Active  . Days of Exercise per Week: 2 days  . Minutes of Exercise per Session: 60 min  Stress: No Stress Concern Present  . Feeling of Stress : Not at all  Social Connections: Moderately Integrated  . Frequency of Communication with Friends and Family: More than three times a week  . Frequency of Social Gatherings with Friends and Family: More than three times a week  . Attends Religious Services: 1 to 4 times per year  . Active Member of Clubs or Organizations: No  . Attends Archivist Meetings: Never  . Marital Status: Married  Human resources officer Violence: Not At Risk  . Fear of Current or Ex-Partner: No  . Emotionally Abused: No  . Physically Abused: No  . Sexually Abused: No    FAMILY HISTORY: Family History  Problem Relation Age of Onset  . Colon cancer Father 79  . Cancer Father   . Alcohol abuse Father   . Stroke Mother   . Diabetes Mother   . Hypertension Mother   . Hyperlipidemia Mother   . Obesity Mother   . Heart disease Maternal Aunt   . Diabetes Maternal Uncle   . Stomach cancer Neg Hx   . Esophageal cancer Neg Hx   . Rectal cancer Neg Hx     ALLERGIES:  is allergic to allopurinol, justicia adhatoda (malabar nut tree) [justicia adhatoda], valsartan-hydrochlorothiazide, and sulfa antibiotics.  MEDICATIONS:  Current Outpatient Medications  Medication Sig Dispense Refill  . Alcohol Swabs (B-D SINGLE USE  SWABS REGULAR) PADS Use as directed. 100 each 3  . Apoaequorin (PREVAGEN PO) Take 20 mg by mouth.    Marland Kitchen aspirin EC 81 MG tablet Take 81 mg by mouth daily.    Marland Kitchen atorvastatin (LIPITOR) 80 MG tablet Take 1 tablet (80 mg total) by mouth daily at 6 PM. 180 tablet 3  . Blood Glucose Calibration (TRUE METRIX LEVEL 1) Low SOLN Use as directed 1 each 3  . Blood Glucose Monitoring Suppl (TRUE METRIX METER) DEVI Use as directed 1 each 0  . carvedilol (COREG) 25 MG tablet TAKE 1 TABLET TWICE DAILY 180 tablet 2  . celecoxib (CELEBREX) 200 MG capsule     . Cholecalciferol (VITAMIN D3) 25 MCG (1000 UT) CAPS Take 2,000 Units by mouth daily.    . felodipine (PLENDIL)  10 MG 24 hr tablet TAKE 1 TABLET BY MOUTH EVERY DAY 90 tablet 3  . glucose blood (TRUE METRIX BLOOD GLUCOSE TEST) test strip Use as instructed 100 each 12  . hydrALAZINE (APRESOLINE) 50 MG tablet Take 1 tablet (50 mg total) by mouth 2 (two) times daily. 180 tablet 3  . hydrochlorothiazide (HYDRODIURIL) 25 MG tablet Take 25 mg by mouth daily.    . metFORMIN (GLUCOPHAGE-XR) 500 MG 24 hr tablet Take 1 tablet (500 mg total) by mouth daily. 90 tablet 3  . OVER THE COUNTER MEDICATION Take 2 capsules by mouth 2 (two) times daily. OmegaXL    . POTASSIUM CITRATE PO Take 15 mEq by mouth.    . spironolactone (ALDACTONE) 25 MG tablet TAKE 1 TABLET EVERY DAY 90 tablet 0  . SYMBICORT 160-4.5 MCG/ACT inhaler TAKE 2 PUFFS BY MOUTH TWICE A DAY 30.6 Inhaler 3  . testosterone cypionate (DEPOTESTOSTERONE CYPIONATE) 200 MG/ML injection INJECT 1 ML INTRAMUSCULARLY EVERY 14 DAYS (Patient not taking: No sig reported) 6 mL 1   No current facility-administered medications for this visit.     PHYSICAL EXAMINATION: ECOG PERFORMANCE STATUS: 0 - Asymptomatic  Vitals:   09/08/20 1035  BP: 133/84  Pulse: 60  Resp: 17  Temp: (!) 97 F (36.1 C)  SpO2: 99%   Filed Weights   09/08/20 1035  Weight: 292 lb 3.2 oz (132.5 kg)   Physical Exam Constitutional:       Appearance: Normal appearance. He is obese.  HENT:     Head: Normocephalic and atraumatic.  Cardiovascular:     Rate and Rhythm: Normal rate and regular rhythm.     Pulses: Normal pulses.     Heart sounds: Normal heart sounds.  Pulmonary:     Effort: Pulmonary effort is normal.     Breath sounds: Normal breath sounds.  Abdominal:     General: Abdomen is flat.     Palpations: Abdomen is soft. There is no mass.  Musculoskeletal:        General: Normal range of motion.     Cervical back: Normal range of motion and neck supple. No rigidity.  Lymphadenopathy:     Cervical: No cervical adenopathy.  Skin:    General: Skin is warm and dry.  Neurological:     General: No focal deficit present.     Mental Status: He is alert.  Psychiatric:        Mood and Affect: Mood normal.        Behavior: Behavior normal.     LABORATORY DATA:  I have reviewed the data as listed Lab Results  Component Value Date   WBC 10.0 09/08/2020   HGB 20.1 (H) 09/08/2020   HCT 63.0 (H) 09/08/2020   MCV 94.0 09/08/2020   PLT 157 09/08/2020     Chemistry      Component Value Date/Time   NA 133 (L) 09/08/2020 1127   NA 140 07/25/2020 1134   K 4.5 09/08/2020 1127   CL 98 09/08/2020 1127   CO2 28 09/08/2020 1127   BUN 20 09/08/2020 1127   BUN 11 07/25/2020 1134   CREATININE 1.21 09/08/2020 1127   CREATININE 1.03 05/02/2016 0946      Component Value Date/Time   CALCIUM 9.6 09/08/2020 1127   ALKPHOS 88 09/08/2020 1127   AST 20 09/08/2020 1127   ALT 24 09/08/2020 1127   BILITOT 1.0 09/08/2020 1127     I reviewed his labs.  CBC showed normal white blood cell  count, hemoglobin of 20.1. No absolute basophilia or thrombocytosis.  CMP is normal  RADIOGRAPHIC STUDIES: I have personally reviewed the radiological images as listed and agreed with the findings in the report. No results found.  All questions were answered. The patient knows to call the clinic with any problems, questions or concerns. I  spent 45 minutes in the care of this patient including H and P, review of records, counseling and coordination of care.     Benay Pike, MD 09/08/2020 12:42 PM

## 2020-09-08 NOTE — Telephone Encounter (Signed)
Per note, leaning toward therapeutic phlebotomy.

## 2020-09-09 ENCOUNTER — Encounter: Payer: Self-pay | Admitting: Hematology and Oncology

## 2020-09-09 ENCOUNTER — Telehealth: Payer: Self-pay | Admitting: Hematology and Oncology

## 2020-09-09 ENCOUNTER — Telehealth: Payer: Self-pay

## 2020-09-09 LAB — PATHOLOGIST SMEAR REVIEW

## 2020-09-09 NOTE — Telephone Encounter (Signed)
Returned call to son, Lanny Hurst, regarding questions about patient's upcoming lab and therapeutic phlebotomy appointment. Clarified the nature of the procedure and what the patient could expect. Patient's son verbalized an understanding of the teaching and had no further questions.

## 2020-09-09 NOTE — Telephone Encounter (Signed)
Scheduled per los. Called and spoke with patient. Confirmed appts  

## 2020-09-12 ENCOUNTER — Inpatient Hospital Stay: Payer: Medicare PPO

## 2020-09-12 ENCOUNTER — Other Ambulatory Visit: Payer: Self-pay

## 2020-09-12 VITALS — BP 124/62 | HR 62 | Temp 98.2°F | Resp 18

## 2020-09-12 DIAGNOSIS — D751 Secondary polycythemia: Secondary | ICD-10-CM

## 2020-09-12 DIAGNOSIS — G4733 Obstructive sleep apnea (adult) (pediatric): Secondary | ICD-10-CM | POA: Diagnosis not present

## 2020-09-12 DIAGNOSIS — Z87891 Personal history of nicotine dependence: Secondary | ICD-10-CM | POA: Diagnosis not present

## 2020-09-12 DIAGNOSIS — E291 Testicular hypofunction: Secondary | ICD-10-CM | POA: Diagnosis not present

## 2020-09-12 DIAGNOSIS — I10 Essential (primary) hypertension: Secondary | ICD-10-CM | POA: Diagnosis not present

## 2020-09-12 DIAGNOSIS — E669 Obesity, unspecified: Secondary | ICD-10-CM | POA: Diagnosis not present

## 2020-09-12 DIAGNOSIS — E119 Type 2 diabetes mellitus without complications: Secondary | ICD-10-CM | POA: Diagnosis not present

## 2020-09-12 LAB — CBC WITH DIFFERENTIAL/PLATELET
Abs Immature Granulocytes: 0.02 10*3/uL (ref 0.00–0.07)
Basophils Absolute: 0.1 10*3/uL (ref 0.0–0.1)
Basophils Relative: 1 %
Eosinophils Absolute: 0.3 10*3/uL (ref 0.0–0.5)
Eosinophils Relative: 3 %
HCT: 61.1 % — ABNORMAL HIGH (ref 39.0–52.0)
Hemoglobin: 19.4 g/dL — ABNORMAL HIGH (ref 13.0–17.0)
Immature Granulocytes: 0 %
Lymphocytes Relative: 13 %
Lymphs Abs: 1.3 10*3/uL (ref 0.7–4.0)
MCH: 30 pg (ref 26.0–34.0)
MCHC: 31.8 g/dL (ref 30.0–36.0)
MCV: 94.4 fL (ref 80.0–100.0)
Monocytes Absolute: 1.4 10*3/uL — ABNORMAL HIGH (ref 0.1–1.0)
Monocytes Relative: 14 %
Neutro Abs: 6.8 10*3/uL (ref 1.7–7.7)
Neutrophils Relative %: 69 %
Platelets: 162 10*3/uL (ref 150–400)
RBC: 6.47 MIL/uL — ABNORMAL HIGH (ref 4.22–5.81)
RDW: 14 % (ref 11.5–15.5)
WBC: 9.8 10*3/uL (ref 4.0–10.5)
nRBC: 0 % (ref 0.0–0.2)

## 2020-09-12 NOTE — Progress Notes (Signed)
David Brandt presents today for phlebotomy per MD orders. Phlebotomy procedure started at 1411 and ended at 1420 using 16 G phlebotomy kit. A total of 509 grams were removed. Patient tolerated procedure well and IV needle was removed intact. Patient provided with beverage and snack and was observed for 30 minutes after procedure without incident. Patient education on phlebotomy aftercare and was provided with his AVS. VSS upon leaving infusion room.

## 2020-09-12 NOTE — Patient Instructions (Signed)

## 2020-09-14 NOTE — Telephone Encounter (Signed)
Sent to Dr Jearld Shines

## 2020-09-15 ENCOUNTER — Encounter (INDEPENDENT_AMBULATORY_CARE_PROVIDER_SITE_OTHER): Payer: Self-pay | Admitting: Family Medicine

## 2020-09-15 ENCOUNTER — Other Ambulatory Visit: Payer: Self-pay

## 2020-09-15 ENCOUNTER — Ambulatory Visit (INDEPENDENT_AMBULATORY_CARE_PROVIDER_SITE_OTHER): Payer: Medicare PPO | Admitting: Family Medicine

## 2020-09-15 VITALS — BP 126/69 | HR 66 | Temp 97.8°F | Ht 68.0 in | Wt 283.0 lb

## 2020-09-15 DIAGNOSIS — E1169 Type 2 diabetes mellitus with other specified complication: Secondary | ICD-10-CM | POA: Diagnosis not present

## 2020-09-15 DIAGNOSIS — Z6841 Body Mass Index (BMI) 40.0 and over, adult: Secondary | ICD-10-CM

## 2020-09-15 DIAGNOSIS — E7849 Other hyperlipidemia: Secondary | ICD-10-CM

## 2020-09-17 ENCOUNTER — Other Ambulatory Visit: Payer: Self-pay | Admitting: Cardiovascular Disease

## 2020-09-19 ENCOUNTER — Other Ambulatory Visit: Payer: Self-pay | Admitting: Family Medicine

## 2020-09-19 ENCOUNTER — Other Ambulatory Visit: Payer: Self-pay

## 2020-09-19 MED ORDER — TRUEPLUS LANCETS 33G MISC: 0 refills | Status: DC

## 2020-09-20 NOTE — Progress Notes (Signed)
Chief Complaint:   OBESITY David Brandt is here to discuss his progress with his obesity treatment plan along with follow-up of his obesity related diagnoses. David Brandt is on the Category 3 Plan and states he is following his eating plan approximately 90% of the time. David Brandt states he is going to the [employer] 60 minutes 2 times per week.  Today's visit was #: 4 Starting weight: 302 lbs Starting date: 07/25/2020 Today's weight: 283 lbs Today's date: 09/15/2020 Total lbs lost to date: 19 lbs Total lbs lost since last in-office visit: 0  Interim History: David Brandt saw hematology and had 1 phlebotomy session already. His wife is preparing food and they are eating similarly just he is eating more. 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 He is not always able to eat all meat or all quantity of food on plan. He is planning to go to the beach at the end of May.  Subjective:   1. Type 2 diabetes mellitus with other specified complication, without long-term current use of insulin (HCC) David Brandt's last A1c was 5.8 and insulin level 22.6. He takes Glucophage.  Lab Results  Component Value Date   HGBA1C 5.8 (A) 06/03/2020   HGBA1C 6.8 (H) 04/21/2019   HGBA1C 6.0 (A) 03/06/2018   Lab Results  Component Value Date   LDLCALC 44 07/25/2020   CREATININE 1.21 09/08/2020   Lab Results  Component Value Date   INSULIN 22.6 07/25/2020    2. Other hyperlipidemia David Brandt is on Lipitor daily. He denies myalgias or transaminitis. His last LDL 44, HDL 39, and triglycerides 194.   Lab Results  Component Value Date   ALT 24 09/08/2020   AST 20 09/08/2020   ALKPHOS 88 09/08/2020   BILITOT 1.0 09/08/2020   Lab Results  Component Value Date   CHOL 115 07/25/2020   HDL 39 (L) 07/25/2020   LDLCALC 44 07/25/2020   TRIG 194 (H) 07/25/2020   CHOLHDL 3.0 04/14/2020    Assessment/Plan:   1. Type 2 diabetes mellitus with other specified complication, without long-term current use of  insulin (HCC) Good blood sugar control is important to decrease the likelihood of diabetic complications such as nephropathy, neuropathy, limb loss, blindness, coronary artery disease, and death. Intensive lifestyle modification including diet, exercise and weight loss are the first line of treatment for diabetes. Repeat labs in 3 months.  2. Other hyperlipidemia Cardiovascular risk and specific lipid/LDL goals reviewed.  We discussed several lifestyle modifications today and David Brandt will continue to work on diet, exercise and weight loss efforts. Orders and follow up as documented in patient record. Repeat labs in 3 months.  Counseling Intensive lifestyle modifications are the first line treatment for this issue. . Dietary changes: Increase soluble fiber. Decrease simple carbohydrates. . Exercise changes: Moderate to vigorous-intensity aerobic activity 150 minutes per week if tolerated. . Lipid-lowering medications: see documented in medical record.  3. Class 3 severe obesity with serious comorbidity and body mass index (BMI) of 40.0 to 44.9 in adult, unspecified obesity type (HCC) David Brandt is currently in the action stage of change. As such, his goal is to continue with weight loss efforts. He has agreed to the Category 3 Plan.   Exercise goals: As is  Behavioral modification strategies: increasing lean protein intake, meal planning and cooking strategies, keeping healthy foods in the home and planning for success.  David Brandt has agreed to follow-up with our clinic in 2-3 weeks. He was informed of the importance  of frequent follow-up visits to maximize his success with intensive lifestyle modifications for his multiple health conditions.   Objective:   Blood pressure 126/69, pulse 66, temperature 97.8 F (36.6 C), temperature source Oral, height 5\' 8"  (1.727 m), weight 283 lb (128.4 kg), SpO2 95 %. Body mass index is 43.03 kg/m.  General: Cooperative, alert, well developed, in no acute  distress. HEENT: Conjunctivae and lids unremarkable. Cardiovascular: Regular rhythm.  Lungs: Normal work of breathing. Neurologic: No focal deficits.   Lab Results  Component Value Date   CREATININE 1.21 09/08/2020   BUN 20 09/08/2020   NA 133 (L) 09/08/2020   K 4.5 09/08/2020   CL 98 09/08/2020   CO2 28 09/08/2020   Lab Results  Component Value Date   ALT 24 09/08/2020   AST 20 09/08/2020   ALKPHOS 88 09/08/2020   BILITOT 1.0 09/08/2020   Lab Results  Component Value Date   HGBA1C 5.8 (A) 06/03/2020   HGBA1C 6.8 (H) 04/21/2019   HGBA1C 6.0 (A) 03/06/2018   HGBA1C 6.8 (H) 12/06/2017   HGBA1C 6.3 (H) 03/31/2016   Lab Results  Component Value Date   INSULIN 22.6 07/25/2020   Lab Results  Component Value Date   TSH 2.050 07/25/2020   Lab Results  Component Value Date   CHOL 115 07/25/2020   HDL 39 (L) 07/25/2020   LDLCALC 44 07/25/2020   TRIG 194 (H) 07/25/2020   CHOLHDL 3.0 04/14/2020   Lab Results  Component Value Date   WBC 9.8 09/12/2020   HGB 19.4 (H) 09/12/2020   HCT 61.1 (H) 09/12/2020   MCV 94.4 09/12/2020   PLT 162 09/12/2020    Attestation Statements:   Reviewed by clinician on day of visit: allergies, medications, problem list, medical history, surgical history, family history, social history, and previous encounter notes.  Time spent on visit including pre-visit chart review and post-visit care and charting was 15 minutes.   Coral Ceo, am acting as transcriptionist for Coralie Common, MD.   I have reviewed the above documentation for accuracy and completeness, and I agree with the above. - Jinny Blossom, MD

## 2020-09-26 ENCOUNTER — Other Ambulatory Visit: Payer: Self-pay

## 2020-09-26 ENCOUNTER — Inpatient Hospital Stay: Payer: Medicare PPO | Attending: Hematology and Oncology

## 2020-09-26 ENCOUNTER — Inpatient Hospital Stay: Payer: Medicare PPO

## 2020-09-26 VITALS — BP 141/74 | HR 56 | Temp 97.9°F | Resp 18

## 2020-09-26 DIAGNOSIS — G4733 Obstructive sleep apnea (adult) (pediatric): Secondary | ICD-10-CM | POA: Insufficient documentation

## 2020-09-26 DIAGNOSIS — Z87891 Personal history of nicotine dependence: Secondary | ICD-10-CM | POA: Insufficient documentation

## 2020-09-26 DIAGNOSIS — D751 Secondary polycythemia: Secondary | ICD-10-CM | POA: Diagnosis not present

## 2020-09-26 DIAGNOSIS — E291 Testicular hypofunction: Secondary | ICD-10-CM | POA: Diagnosis not present

## 2020-09-26 DIAGNOSIS — I10 Essential (primary) hypertension: Secondary | ICD-10-CM | POA: Diagnosis not present

## 2020-09-26 DIAGNOSIS — E119 Type 2 diabetes mellitus without complications: Secondary | ICD-10-CM | POA: Diagnosis not present

## 2020-09-26 DIAGNOSIS — E669 Obesity, unspecified: Secondary | ICD-10-CM | POA: Insufficient documentation

## 2020-09-26 LAB — CBC WITH DIFFERENTIAL/PLATELET
Abs Immature Granulocytes: 0.04 10*3/uL (ref 0.00–0.07)
Basophils Absolute: 0.1 10*3/uL (ref 0.0–0.1)
Basophils Relative: 1 %
Eosinophils Absolute: 0.3 10*3/uL (ref 0.0–0.5)
Eosinophils Relative: 3 %
HCT: 58.6 % — ABNORMAL HIGH (ref 39.0–52.0)
Hemoglobin: 19 g/dL — ABNORMAL HIGH (ref 13.0–17.0)
Immature Granulocytes: 0 %
Lymphocytes Relative: 14 %
Lymphs Abs: 1.4 10*3/uL (ref 0.7–4.0)
MCH: 29.9 pg (ref 26.0–34.0)
MCHC: 32.4 g/dL (ref 30.0–36.0)
MCV: 92.1 fL (ref 80.0–100.0)
Monocytes Absolute: 1 10*3/uL (ref 0.1–1.0)
Monocytes Relative: 10 %
Neutro Abs: 7.5 10*3/uL (ref 1.7–7.7)
Neutrophils Relative %: 72 %
Platelets: 174 10*3/uL (ref 150–400)
RBC: 6.36 MIL/uL — ABNORMAL HIGH (ref 4.22–5.81)
RDW: 13.9 % (ref 11.5–15.5)
WBC: 10.3 10*3/uL (ref 4.0–10.5)
nRBC: 0 % (ref 0.0–0.2)

## 2020-09-26 NOTE — Patient Instructions (Signed)

## 2020-09-26 NOTE — Progress Notes (Signed)
David Brandt presents today for phlebotomy per MD orders. 16 g. Phlebotomy kit to L antecubital. Phlebotomy procedure started at 1331 and ended at 1338. 516 grams removed. Patient observed for 30 minutes after procedure without any incident. Patient tolerated procedure well. IV needle removed intact.  Food and drink given.

## 2020-10-06 ENCOUNTER — Inpatient Hospital Stay: Payer: Medicare PPO | Admitting: Hematology and Oncology

## 2020-10-10 ENCOUNTER — Other Ambulatory Visit: Payer: Self-pay

## 2020-10-10 ENCOUNTER — Inpatient Hospital Stay: Payer: Medicare PPO

## 2020-10-10 ENCOUNTER — Encounter (INDEPENDENT_AMBULATORY_CARE_PROVIDER_SITE_OTHER): Payer: Self-pay | Admitting: Family Medicine

## 2020-10-10 ENCOUNTER — Ambulatory Visit (INDEPENDENT_AMBULATORY_CARE_PROVIDER_SITE_OTHER): Payer: Medicare PPO | Admitting: Family Medicine

## 2020-10-10 VITALS — BP 114/58 | HR 62 | Temp 98.0°F | Ht 68.0 in | Wt 277.0 lb

## 2020-10-10 DIAGNOSIS — E1169 Type 2 diabetes mellitus with other specified complication: Secondary | ICD-10-CM | POA: Diagnosis not present

## 2020-10-10 DIAGNOSIS — Z6841 Body Mass Index (BMI) 40.0 and over, adult: Secondary | ICD-10-CM

## 2020-10-10 DIAGNOSIS — E559 Vitamin D deficiency, unspecified: Secondary | ICD-10-CM | POA: Diagnosis not present

## 2020-10-11 NOTE — Progress Notes (Signed)
Chief Complaint:   OBESITY David Brandt is here to discuss his progress with his obesity treatment plan along with follow-up of his obesity related diagnoses. David Brandt is on the Category 3 Plan and states he is following his eating plan approximately 80% of the time. Oral states he is doing cardio and weights 60 minutes 3 times per week.  Today's visit was #: 5 Starting weight: 302 lbs Starting date: 07/25/2020 Today's weight: 277 lbs Today's date: 10/10/2020 Total lbs lost to date: 25 lbs Total lbs lost since last in-office visit: 6  Interim History: David Brandt has committed to category 3 even with birthday parties. He is trying to go to [employer] 3-4 days a week. He is doing egg sandwich and fruit for breakfast and fish for dinner. Pt is still skipping lunch occasionally.  Subjective:   1. Type 2 diabetes mellitus with other specified complication, without long-term current use of insulin (HCC) David Brandt is on Metformin with no GI side effects. His last A1c was 5.8.  2. Vitamin D deficiency David Brandt denies nausea, vomiting, and muscle weakness but notes fatigue. His last Vit D level was 39.1.  Assessment/Plan:   1. Type 2 diabetes mellitus with other specified complication, without long-term current use of insulin (HCC) Good blood sugar control is important to decrease the likelihood of diabetic complications such as nephropathy, neuropathy, limb loss, blindness, coronary artery disease, and death. Intensive lifestyle modification including diet, exercise and weight loss are the first line of treatment for diabetes. Repeat labs in 2 months.  2. Vitamin D deficiency Low Vitamin D level contributes to fatigue and are associated with obesity, breast, and colon cancer. He agrees to continue to take OTC Vitamin D and will follow-up for routine testing of Vitamin D, at least 2-3 times per year to avoid over-replacement. Repeat labs in June.  3. Class 3 severe obesity with serious comorbidity  and body mass index (BMI) of 45.0 to 49.9 in adult, unspecified obesity type (HCC) David Brandt is currently in the action stage of change. As such, his goal is to continue with weight loss efforts. He has agreed to the Category 3 Plan. Pt is to start getting something in daily for lunch.  Exercise goals: As is  Behavioral modification strategies: increasing lean protein intake, no skipping meals, meal planning and cooking strategies, keeping healthy foods in the home and planning for success.  David Brandt has agreed to follow-up with our clinic in 2-3 weeks. He was informed of the importance of frequent follow-up visits to maximize his success with intensive lifestyle modifications for his multiple health conditions.   Objective:   Blood pressure (!) 114/58, pulse 62, temperature 98 F (36.7 C), height 5\' 8"  (1.727 m), weight 277 lb (125.6 kg), SpO2 96 %. Body mass index is 42.12 kg/m.  General: Cooperative, alert, well developed, in no acute distress. HEENT: Conjunctivae and lids unremarkable. Cardiovascular: Regular rhythm.  Lungs: Normal work of breathing. Neurologic: No focal deficits.   Lab Results  Component Value Date   CREATININE 1.21 09/08/2020   BUN 20 09/08/2020   NA 133 (L) 09/08/2020   K 4.5 09/08/2020   CL 98 09/08/2020   CO2 28 09/08/2020   Lab Results  Component Value Date   ALT 24 09/08/2020   AST 20 09/08/2020   ALKPHOS 88 09/08/2020   BILITOT 1.0 09/08/2020   Lab Results  Component Value Date   HGBA1C 5.8 (A) 06/03/2020   HGBA1C 6.8 (H) 04/21/2019   HGBA1C 6.0 (A)  03/06/2018   HGBA1C 6.8 (H) 12/06/2017   HGBA1C 6.3 (H) 03/31/2016   Lab Results  Component Value Date   INSULIN 22.6 07/25/2020   Lab Results  Component Value Date   TSH 2.050 07/25/2020   Lab Results  Component Value Date   CHOL 115 07/25/2020   HDL 39 (L) 07/25/2020   LDLCALC 44 07/25/2020   TRIG 194 (H) 07/25/2020   CHOLHDL 3.0 04/14/2020   Lab Results  Component Value Date   WBC  10.3 09/26/2020   HGB 19.0 (H) 09/26/2020   HCT 58.6 (H) 09/26/2020   MCV 92.1 09/26/2020   PLT 174 09/26/2020   Attestation Statements:   Reviewed by clinician on day of visit: allergies, medications, problem list, medical history, surgical history, family history, social history, and previous encounter notes.  Time spent on visit including pre-visit chart review and post-visit care and charting was 15 minutes.   Coral Ceo, am acting as transcriptionist for Coralie Common, MD.   I have reviewed the above documentation for accuracy and completeness, and I agree with the above. - Jinny Blossom, MD

## 2020-10-15 ENCOUNTER — Encounter: Payer: Self-pay | Admitting: Hematology and Oncology

## 2020-10-16 ENCOUNTER — Other Ambulatory Visit: Payer: Self-pay | Admitting: Family Medicine

## 2020-10-16 DIAGNOSIS — E291 Testicular hypofunction: Secondary | ICD-10-CM

## 2020-10-18 ENCOUNTER — Telehealth: Payer: Self-pay | Admitting: Hematology and Oncology

## 2020-10-18 NOTE — Telephone Encounter (Signed)
Scheduled appt per 4/26 sch msg. Pt aware.  

## 2020-10-19 ENCOUNTER — Ambulatory Visit (INDEPENDENT_AMBULATORY_CARE_PROVIDER_SITE_OTHER)
Admission: RE | Admit: 2020-10-19 | Discharge: 2020-10-19 | Disposition: A | Discharge status: Home / Self Care | Payer: Medicare PPO | Source: Ambulatory Visit | Attending: Family Medicine | Admitting: Family Medicine

## 2020-10-19 ENCOUNTER — Ambulatory Visit (INDEPENDENT_AMBULATORY_CARE_PROVIDER_SITE_OTHER): Payer: Medicare PPO | Admitting: Acute Care

## 2020-10-19 ENCOUNTER — Other Ambulatory Visit: Payer: Self-pay

## 2020-10-19 ENCOUNTER — Encounter: Payer: Self-pay | Admitting: Acute Care

## 2020-10-19 VITALS — BP 124/74 | HR 64 | Temp 97.6°F | Ht 69.0 in | Wt 278.4 lb

## 2020-10-19 DIAGNOSIS — Z87891 Personal history of nicotine dependence: Secondary | ICD-10-CM | POA: Diagnosis not present

## 2020-10-19 DIAGNOSIS — Z122 Encounter for screening for malignant neoplasm of respiratory organs: Secondary | ICD-10-CM

## 2020-10-19 NOTE — Progress Notes (Signed)
Shared Decision Making Visit Lung Cancer Screening Program [phone])   Eligibility:  Age 75 y.o.  Pack Years Smoking History Calculation 47 pack year smoking history (# packs/per year x # years smoked)  Recent History of coughing up blood  no  Unexplained weight loss? no ( >Than 15 pounds within the last 6 months )  Prior History Lung / other cancer no (Diagnosis within the last 5 years already requiring surveillance chest CT Scans).  Smoking Status Former Smoker  Former Smokers: Years since quit: 2 years  Quit Date: 2020   Pt. Does still smoke non-tobacco products  Visit Components:  Discussion included one or more decision making aids. yes  Discussion included risk/benefits of screening. yes  Discussion included potential follow up diagnostic testing for abnormal scans. yes  Discussion included meaning and risk of over diagnosis. yes  Discussion included meaning and risk of False Positives. yes  Discussion included meaning of total radiation exposure. yes  Counseling Included:  Importance of adherence to annual lung cancer LDCT screening. yes  Impact of comorbidities on ability to participate in the program. yes  Ability and willingness to under diagnostic treatment. yes  Smoking Cessation Counseling:  Current Smokers:   Discussed importance of smoking cessation. yes  Information about tobacco cessation classes and interventions provided to patient. yes  Patient provided with "ticket" for LDCT Scan. yes  Symptomatic Patient. no  Counseling NA  Diagnosis Code: Tobacco Use Z72.0  Asymptomatic Patient yes  Counseling (Intermediate counseling: > three minutes counseling) [id number]  Former Smokers:   Discussed the importance of maintaining cigarette abstinence. yes  Diagnosis Code: Personal History of Nicotine Dependence. W10.272  Information about tobacco cessation classes and interventions provided to patient. Yes  Patient provided with "ticket" for  LDCT Scan. yes  Written Order for Lung Cancer Screening with LDCT placed in Epic. Yes (CT Chest Lung Cancer Screening Low Dose W/O CM) [id number] Z12.2-Screening of respiratory organs Z87.891-Personal history of nicotine dependence  I spent 25 minutes of face to face time with Mr. Dugar discussing the risks and benefits of lung cancer screening. We viewed a power point together that explained in detail the above noted topics. We took the time to pause the power point at intervals to allow for questions to be asked and answered to ensure understanding. We discussed that he had taken the single most powerful action possible to decrease his risk of developing lung cancer when he quit smoking. I counseled him to remain smoke free, and to contact me if he ever had the desire to smoke again so that I can provide resources and tools to help support the effort to remain smoke free. We discussed the time and location of the scan, and that either  Doroteo Glassman RN or I will call with the results within  24-48 hours of receiving them. He has my card and contact information in the event he needs to speak with me, in addition to a copy of the power point we reviewed as a resource. He verbalized understanding of all of the above and had no further questions upon leaving the office.     I explained to the patient that there has been a high incidence of coronary artery disease noted on these exams. I explained that this is a non-gated exam therefore degree or severity cannot be determined. This patient is currently on statin therapy. I have asked the patient to follow-up with their PCP regarding any incidental finding of coronary artery disease and management  with diet or medication as they feel is clinically indicated. The patient verbalized understanding of the above and had no further questions.     Magdalen Spatz, NP 10/19/2020

## 2020-10-19 NOTE — Patient Instructions (Signed)
Thank you for participating in the [facility]. It was our pleasure to meet you today. We will call you with the results of your scan within the next few days. Your scan will be assigned a Lung RADS category score by the physicians reading the scans.  This Lung RADS score determines follow up scanning.  See below for description of categories, and follow up screening recommendations. We will be in touch to schedule your follow up screening annually or based on recommendations of our providers. We will fax a copy of your scan results to your Primary Care Physician, or the physician who referred you to the program, to ensure they have the results. Please call the office if you have any questions or concerns regarding your scanning experience or results.  Our office number is [phone]. Please speak with Denise Phelps, RN. She is our Lung Cancer Screening RN. If she is unavailable when you call, please have the office staff send her a message. She will return your call at her earliest convenience. Remember, if your scan is normal, we will scan you annually as long as you continue to meet the criteria for the program. (Age 55-77, Current smoker or smoker who has quit within the last 15 years). If you are a smoker, remember, quitting is the single most powerful action that you can take to decrease your risk of lung cancer and other pulmonary, breathing related problems. We know quitting is hard, and we are here to help.  Please let us know if there is anything we can do to help you meet your goal of quitting. If you are a former smoker, congratulations. We are proud of you! Remain smoke free! Remember you can refer friends or family members through the number above.  We will screen them to make sure they meet criteria for the program. Thank you for helping us take better care of you by participating in Lung Screening.  Lung RADS Categories:  Lung RADS 1: no nodules  or definitely non-concerning nodules.  Recommendation is for a repeat annual scan in 12 months.  Lung RADS 2:  nodules that are non-concerning in appearance and behavior with a very low likelihood of becoming an active cancer. Recommendation is for a repeat annual scan in 12 months.  Lung RADS 3: nodules that are probably non-concerning , includes nodules with a low likelihood of becoming an active cancer.  Recommendation is for a 6-month repeat screening scan. Often noted after an upper respiratory illness. We will be in touch to make sure you have no questions, and to schedule your 6-month scan.  Lung RADS 4 A: nodules with concerning findings, recommendation is most often for a follow up scan in 3 months or additional testing based on our provider's assessment of the scan. We will be in touch to make sure you have no questions and to schedule the recommended 3 month follow up scan.  Lung RADS 4 B:  indicates findings that are concerning. We will be in touch with you to schedule additional diagnostic testing based on our provider's  assessment of the scan.   

## 2020-10-21 DIAGNOSIS — E119 Type 2 diabetes mellitus without complications: Secondary | ICD-10-CM | POA: Diagnosis not present

## 2020-10-24 ENCOUNTER — Inpatient Hospital Stay: Payer: Medicare PPO | Admitting: Hematology and Oncology

## 2020-10-24 ENCOUNTER — Encounter: Payer: Self-pay | Admitting: Hematology and Oncology

## 2020-10-24 ENCOUNTER — Inpatient Hospital Stay: Payer: Medicare PPO | Attending: Hematology and Oncology

## 2020-10-24 ENCOUNTER — Telehealth: Payer: Self-pay | Admitting: Hematology and Oncology

## 2020-10-24 ENCOUNTER — Other Ambulatory Visit: Payer: Self-pay

## 2020-10-24 ENCOUNTER — Inpatient Hospital Stay: Payer: Medicare PPO

## 2020-10-24 VITALS — BP 117/64 | HR 58 | Resp 18

## 2020-10-24 VITALS — BP 117/68 | HR 56 | Temp 97.8°F | Resp 18 | Wt 283.7 lb

## 2020-10-24 DIAGNOSIS — D751 Secondary polycythemia: Secondary | ICD-10-CM | POA: Diagnosis not present

## 2020-10-24 DIAGNOSIS — G4733 Obstructive sleep apnea (adult) (pediatric): Secondary | ICD-10-CM | POA: Diagnosis not present

## 2020-10-24 DIAGNOSIS — E669 Obesity, unspecified: Secondary | ICD-10-CM | POA: Diagnosis not present

## 2020-10-24 DIAGNOSIS — E291 Testicular hypofunction: Secondary | ICD-10-CM | POA: Diagnosis not present

## 2020-10-24 DIAGNOSIS — I10 Essential (primary) hypertension: Secondary | ICD-10-CM | POA: Diagnosis not present

## 2020-10-24 DIAGNOSIS — E119 Type 2 diabetes mellitus without complications: Secondary | ICD-10-CM | POA: Diagnosis not present

## 2020-10-24 DIAGNOSIS — Z87891 Personal history of nicotine dependence: Secondary | ICD-10-CM | POA: Diagnosis not present

## 2020-10-24 LAB — CBC WITH DIFFERENTIAL/PLATELET
Abs Immature Granulocytes: 0.04 10*3/uL (ref 0.00–0.07)
Basophils Absolute: 0.1 10*3/uL (ref 0.0–0.1)
Basophils Relative: 1 %
Eosinophils Absolute: 0.4 10*3/uL (ref 0.0–0.5)
Eosinophils Relative: 4 %
HCT: 52.3 % — ABNORMAL HIGH (ref 39.0–52.0)
Hemoglobin: 17.1 g/dL — ABNORMAL HIGH (ref 13.0–17.0)
Immature Granulocytes: 0 %
Lymphocytes Relative: 16 %
Lymphs Abs: 1.5 10*3/uL (ref 0.7–4.0)
MCH: 29.7 pg (ref 26.0–34.0)
MCHC: 32.7 g/dL (ref 30.0–36.0)
MCV: 90.8 fL (ref 80.0–100.0)
Monocytes Absolute: 0.9 10*3/uL (ref 0.1–1.0)
Monocytes Relative: 10 %
Neutro Abs: 6.7 10*3/uL (ref 1.7–7.7)
Neutrophils Relative %: 69 %
Platelets: 190 10*3/uL (ref 150–400)
RBC: 5.76 MIL/uL (ref 4.22–5.81)
RDW: 14 % (ref 11.5–15.5)
WBC: 9.6 10*3/uL (ref 4.0–10.5)
nRBC: 0 % (ref 0.0–0.2)

## 2020-10-24 NOTE — Progress Notes (Signed)
[facility] NOTE  Patient Care Team: [name], MD as PCP - General (Family Medicine) Skeet Latch, MD as PCP - Cardiology (Cardiology) Clark-Burning, Anderson Malta, PA-C (Inactive) (Dermatology)  CHIEF COMPLAINTS/PURPOSE OF CONSULTATION:  Polycythemia, secondary  ASSESSMENT & PLAN:  No problem-specific Assessment & Plan notes found for this encounter.  Orders Placed This Encounter  Procedures  . CBC with Differential/Platelet    Standing Status:   Standing    Number of Occurrences:   22    Standing Expiration Date:   10/24/2021   #1.  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  Patient is here for follow-up.  He is doing really well.  He has been on testosterone injection for about 10 to 15 years but he does not feel any difference when he was discontinued on it.  He denies any hyperviscosity symptoms today.  No B symptoms. Physical examination, abdominal obesity, otherwise no major problems. I have reviewed labs from today, hematocrit greater than 51, we will proceed with phlebotomy. If he intends to go back on testosterone injection, recommended labs and tentative phlebotomy monthly if he meets parameters.  If he decides not to go back on phlebotomy, he can return to clinic in 3 months for follow-up and repeat labs.  He is agreeable to both recommendations.  He will let us know if he ends up going back on testosterone after discussing with his primary care physician.  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 He is currently off of testosterone.  I have discussed that testosterone does increase the risk of polycythemia which in turn can increase the risk of cardiovascular events.  3. OSA on CPAP,  patient diligent about using it every day.  HISTORY OF PRESENTING ILLNESS:  David Brandt 75 y.o. male is here because of polycythemia.  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  He is doing really well.  He just complains of some seasonal allergies.  No B symptoms.  No change in breathing.  No hyperviscosity symptoms.  No change in bowel or urinary habits. No new neurological complaints.  Rest of the pertinent 10 point ROS reviewed and negative.  REVIEW OF SYSTEMS:   Constitutional: Denies fevers, chills or abnormal night sweats Eyes: Denies blurriness of vision, double vision or watery eyes Ears, nose, mouth, throat, and face: Denies mucositis or sore throat Respiratory: Denies cough, dyspnea or wheezes Cardiovascular: Denies palpitation, chest discomfort or lower extremity swelling Gastrointestinal:  Denies nausea, heartburn or change in bowel habits Skin: Denies abnormal skin rashes Lymphatics: Denies new lymphadenopathy or easy bruising Neurological:Denies numbness, tingling or new weaknesses Behavioral/Psych: Mood is stable, no new changes  All other systems were reviewed with the patient and are negative.  MEDICAL HISTORY:  Past Medical History:  Diagnosis Date  . Abnormal cardiovascular stress test 03/29/2016  . Allergic rhinitis   . Allergy   . Arthritis   . Asthma   . Back pain   . Diabetes ([place]) 03/29/2016  . Gout   . History of kidney stones   . Hyperlipidemia   .  Hypertension   . Joint pain   . Multiple food allergies   . Prediabetes   . Sleep apnea    cpap    SURGICAL HISTORY: Past Surgical History:  Procedure Laterality Date  . CARDIAC CATHETERIZATION N/A  03/29/2016   Procedure: Right/Left Heart Cath and Coronary Angiography;  Surgeon: Nelva Bush, MD;  Location: [facility];  Service: Cardiovascular;  Laterality: N/A;  . CARDIAC CATHETERIZATION N/A 03/29/2016   Procedure: Intravascular Pressure Wire/FFR Study;  Surgeon: Nelva Bush, MD;  Location: [facility];  Service: Cardiovascular;  Laterality: N/A;  . CARDIAC CATHETERIZATION Left 04/02/2016   Procedure: FEMORAL ARTERIAL LINE INSERTION;  Surgeon: [name], MD;  Location: [facility];  Service: Open Heart Surgery;  Laterality: Left;  . COLONOSCOPY  02/20/2018   per Dr. Carlean Purl, no polyps, repeat in 5 yrs (previous adenomatous polyps)   . CORONARY ARTERY BYPASS GRAFT N/A 04/02/2016   Procedure: CORONARY ARTERY BYPASS GRAFTING (CABG) X 5 UTILIZING LEFT INTERNAL MAMMARY ARTERY AND ENDOSCOPICALLY HARVESTED GREATER SAPHENOUS VEIN ; Mammary to LAD, Sequencial 1st to 2nd Diagonal, Vein to OM, Vein to Distal RCA.;  Surgeon: [name], MD;  Location: [facility];  Service: Open Heart Surgery;  Laterality: N/A;  . CYSTOSCOPY/URETEROSCOPY/HOLMIUM LASER/STENT PLACEMENT Bilateral 05/07/2019   Procedure: LEFT URETEROSCOPY/HOLMIUM LASER/STENT PLACEMENT/BIOPSY LEFT MID URETER/RIGHT RETROGRADE STENT PLACEMENT ;  Surgeon: [name], MD;  Location: [facility];  Service: Urology;  Laterality: Bilateral;  . CYSTOSCOPY/URETEROSCOPY/HOLMIUM LASER/STENT PLACEMENT Bilateral 05/27/2019   Procedure: CYSTOSCOPY, URETEROSCOPY/ RETROGRADE PYELOGRAM/ HOLMIUM LASER/STENT EXCHANGE;  Surgeon: [name], MD;  Location: [facility];  Service: Urology;  Laterality: Bilateral;  . LYMPH NODE BIOPSY Left 04/02/2016   Procedure: INCIDENTAL LEFT MAMMARY LYMPH NODE BIOPSY;  Surgeon: [name], MD;  Location: [facility];  Service: Open Heart Surgery;  Laterality: Left;  . mass abdomen  1955  . NEPHROLITHOTOMY Left 04/24/2019   Procedure: NEPHROLITHOTOMY PERCUTANEOUS WITH SURGEON ACCESS;  Surgeon:  [name], MD;  Location: [facility];  Service: Urology;  Laterality: Left;  . TEE WITHOUT CARDIOVERSION N/A 04/02/2016   Procedure: TRANSESOPHAGEAL ECHOCARDIOGRAM (TEE);  Surgeon: [name], MD;  Location: [facility];  Service: Open Heart Surgery;  Laterality: N/A;    SOCIAL HISTORY: Social History   Socioeconomic History  . Marital status: Married    Spouse name: Hassan Rowan  . Number of children: Y  . Years of education: Not on file  . Highest education level: Not on file  Occupational History  . Not on file  Tobacco Use  . Smoking status: Former Smoker    Packs/day: 1.00    Years: 47.00    Pack years: 47.00    Types: Cigarettes    Quit date: 06/25/2018    Years since quitting: 2.3  . Smokeless tobacco: Never Used  Vaping Use  . Vaping Use: Never used  Substance and Sexual Activity  . Alcohol use: Yes    Alcohol/week: 4.0 standard drinks    Types: 4 Cans of beer per week  . Drug use: Yes    Frequency: 7.0 times per week    Types: Marijuana    Comment: marijuana-states still smoking 10/19/2020  . Sexual activity: Not on file  Other Topics Concern  . Not on file  Social History Narrative  . Not on file   Social Determinants of Health   Financial Resource Strain: Low Risk   . Difficulty of Paying Living Expenses: Not hard at all  Food  Insecurity: No Food Insecurity  . Worried About Charity fundraiser in the Last Year: Never true  . Ran Out of Food in the Last Year: Never true  Transportation Needs: No Transportation Needs  . Lack of Transportation (Medical): No  . Lack of Transportation (Non-Medical): No  Physical Activity: Insufficiently Active  . Days of Exercise per Week: 2 days  . Minutes of Exercise per Session: 60 min  Stress: No Stress Concern Present  . Feeling of Stress : Not at all  Social Connections: Moderately Integrated  . Frequency of Communication with Friends and Family: More than three times a week  . Frequency of Social Gatherings with  Friends and Family: More than three times a week  . Attends Religious Services: 1 to 4 times per year  . Active Member of Clubs or Organizations: No  . Attends Archivist Meetings: Never  . Marital Status: Married  Human resources officer Violence: Not At Risk  . Fear of Current or Ex-Partner: No  . Emotionally Abused: No  . Physically Abused: No  . Sexually Abused: No    FAMILY HISTORY: Family History  Problem Relation Age of Onset  . Colon cancer Father 74  . Cancer Father   . Alcohol abuse Father   . Stroke Mother   . Diabetes Mother   . Hypertension Mother   . Hyperlipidemia Mother   . Obesity Mother   . Heart disease Maternal Aunt   . Diabetes Maternal Uncle   . Stomach cancer Neg Hx   . Esophageal cancer Neg Hx   . Rectal cancer Neg Hx     ALLERGIES:  is allergic to allopurinol, justicia adhatoda (malabar nut tree) [justicia adhatoda], valsartan-hydrochlorothiazide, and sulfa antibiotics.  MEDICATIONS:  Current Outpatient Medications  Medication Sig Dispense Refill  . Alcohol Swabs (B-D SINGLE USE SWABS REGULAR) PADS Use as directed. 100 each 3  . Apoaequorin (PREVAGEN PO) Take 20 mg by mouth.    Marland Kitchen aspirin EC 81 MG tablet Take 81 mg by mouth daily.    Marland Kitchen atorvastatin (LIPITOR) 80 MG tablet TAKE 1 TABLET (80 MG TOTAL) BY MOUTH DAILY AT 6 PM. 90 tablet 1  . Blood Glucose Calibration (TRUE METRIX LEVEL 1) Low SOLN Use as directed 1 each 3  . Blood Glucose Monitoring Suppl (TRUE METRIX METER) DEVI Use as directed 1 each 0  . carvedilol (COREG) 25 MG tablet TAKE 1 TABLET TWICE DAILY 180 tablet 2  . celecoxib (CELEBREX) 200 MG capsule     . Cholecalciferol (VITAMIN D3) 25 MCG (1000 UT) CAPS Take 2,000 Units by mouth daily.    . felodipine (PLENDIL) 10 MG 24 hr tablet TAKE 1 TABLET BY MOUTH EVERY DAY 90 tablet 3  . glucose blood (TRUE METRIX BLOOD GLUCOSE TEST) test strip Use as instructed 100 each 12  . hydrALAZINE (APRESOLINE) 50 MG tablet Take 1 tablet (50 mg total)  by mouth 2 (two) times daily. 180 tablet 3  . hydrochlorothiazide (HYDRODIURIL) 25 MG tablet Take 25 mg by mouth daily.    . metFORMIN (GLUCOPHAGE-XR) 500 MG 24 hr tablet Take 1 tablet (500 mg total) by mouth daily. 90 tablet 3  . OVER THE COUNTER MEDICATION Take 2 capsules by mouth 2 (two) times daily. OmegaXL    . POTASSIUM CITRATE PO Take 15 mEq by mouth.    . spironolactone (ALDACTONE) 25 MG tablet TAKE 1 TABLET EVERY DAY 90 tablet 0  . SYMBICORT 160-4.5 MCG/ACT inhaler TAKE 2 PUFFS BY MOUTH  TWICE A DAY 30.6 Inhaler 3  . TRUEplus Lancets 33G MISC Use to check Blood Sugars daily 100 each 0   No current facility-administered medications for this visit.     PHYSICAL EXAMINATION: ECOG PERFORMANCE STATUS: 0 - Asymptomatic  Vitals:   10/24/20 0933  BP: 117/68  Pulse: (!) 56  Resp: 18  Temp: 97.8 F (36.6 C)  SpO2: 96%   Filed Weights   10/24/20 0933  Weight: 283 lb 11.2 oz (128.7 kg)   Physical Exam Constitutional:      Appearance: Normal appearance. He is obese.  HENT:     Head: Normocephalic and atraumatic.  Cardiovascular:     Rate and Rhythm: Normal rate and regular rhythm.     Pulses: Normal pulses.     Heart sounds: Normal heart sounds.  Pulmonary:     Effort: Pulmonary effort is normal.     Breath sounds: Normal breath sounds.  Abdominal:     General: Abdomen is flat.     Palpations: Abdomen is soft. There is no mass.  Musculoskeletal:        General: Normal range of motion.     Cervical back: Normal range of motion and neck supple. No rigidity.  Lymphadenopathy:     Cervical: No cervical adenopathy.  Skin:    General: Skin is warm and dry.  Neurological:     General: No focal deficit present.     Mental Status: He is alert.  Psychiatric:        Mood and Affect: Mood normal.        Behavior: Behavior normal.     LABORATORY DATA:  I have reviewed the data as listed Lab Results  Component Value Date   WBC 9.6 10/24/2020   HGB 17.1 (H) 10/24/2020    HCT 52.3 (H) 10/24/2020   MCV 90.8 10/24/2020   PLT 190 10/24/2020     Chemistry      Component Value Date/Time   NA 133 (L) 09/08/2020 1127   NA 140 07/25/2020 1134   K 4.5 09/08/2020 1127   CL 98 09/08/2020 1127   CO2 28 09/08/2020 1127   BUN 20 09/08/2020 1127   BUN 11 07/25/2020 1134   CREATININE 1.21 09/08/2020 1127   CREATININE 1.03 05/02/2016 0946      Component Value Date/Time   CALCIUM 9.6 09/08/2020 1127   ALKPHOS 88 09/08/2020 1127   AST 20 09/08/2020 1127   ALT 24 09/08/2020 1127   BILITOT 1.0 09/08/2020 1127     I reviewed his labs.  CBC showed hemoglobin of 17 and hematocrit of 52  RADIOGRAPHIC STUDIES: I have personally reviewed the radiological images as listed and agreed with the findings in the report. CT CHEST LUNG CA SCREEN LOW DOSE W/O CM  Result Date: 10/20/2020 CLINICAL DATA:  Lung cancer screening. Fifty-four pack-year history. Former asymptomatic smoker. EXAM: CT CHEST WITHOUT CONTRAST LOW-DOSE FOR LUNG CANCER SCREENING TECHNIQUE: Multidetector CT imaging of the chest was performed following the standard protocol without IV contrast. COMPARISON:  04/05/2015 FINDINGS: Cardiovascular: Normal heart size. No pleural or pericardial effusion. Aortic atherosclerosis. Previous CABG procedure. Mediastinum/Nodes: No enlarged mediastinal, hilar, or axillary lymph nodes. Thyroid gland, trachea, and esophagus demonstrate no significant findings. Lungs/Pleura: No pleural effusion. No airspace consolidation, atelectasis, or pneumothorax. Mild paraseptal and centrilobular emphysema with diffuse bronchial wall thickening. Single tiny nodule in the lateral left lung base has an equivalent diameter of 2 mm. No additional lung nodules. Upper Abdomen: No acute abnormality. Musculoskeletal:  No chest wall mass or suspicious bone lesions identified. Previous median sternotomy IMPRESSION: 1. Lung-RADS 2, benign appearance or behavior. Continue annual screening with low-dose chest CT  without contrast in 12 months. 2. Diffuse bronchial wall thickening with emphysema, as above; imaging findings suggestive of underlying COPD. 3. Emphysema and aortic atherosclerosis. Aortic Atherosclerosis (ICD10-I70.0) and Emphysema (ICD10-J43.9). Electronically Signed   By: Kerby Moors M.D.   On: 10/20/2020 07:44    I have spent 20 minutes in the care of this patient including history, physical exam, discussed about phlebotomies regularly versus blood donation depending on if he ends up going back on testosterone, discussed about hyperviscosity with testosterone.    Benay Pike, MD 10/24/2020 10:07 AM

## 2020-10-24 NOTE — Patient Instructions (Signed)

## 2020-10-24 NOTE — Telephone Encounter (Signed)
Scheduled per los. Confirmed appts. Gave avs and calendar

## 2020-10-24 NOTE — Progress Notes (Signed)
David Brandt presents today for phlebotomy per MD orders. Phlebotomy procedure started at 1143 via 16G to L AC and ended at 1148. 513 grams removed. IV needle removed intact. Patient tolerated procedure well. Offered food and drink and observed for 30 minutes after procedure without any incident.

## 2020-10-29 NOTE — Progress Notes (Signed)
Please call patient and let them  know their  low dose Ct was read as a Lung RADS 2: nodules that are benign in appearance and behavior with a very low likelihood of becoming a clinically active cancer due to size or lack of growth. Recommendation per radiology is for a repeat LDCT in 12 months. .Please let them  know we will order and schedule their  annual screening scan for 09/2021. Please let them  know there was notation of CAD on their  scan.  Please remind the patient  that this is a non-gated exam therefore degree or severity of disease  cannot be determined. Please have them  follow up with their PCP regarding potential risk factor modification, dietary therapy or pharmacologic therapy if clinically indicated. Pt.  is  currently on statin therapy. Please place order for annual  screening scan for  09/2021 and fax results to PCP. Thanks so much. 

## 2020-11-01 ENCOUNTER — Encounter: Payer: Self-pay | Admitting: *Deleted

## 2020-11-01 ENCOUNTER — Other Ambulatory Visit: Payer: Self-pay | Admitting: Cardiovascular Disease

## 2020-11-01 ENCOUNTER — Other Ambulatory Visit: Payer: Self-pay | Admitting: *Deleted

## 2020-11-01 DIAGNOSIS — Z87891 Personal history of nicotine dependence: Secondary | ICD-10-CM

## 2020-11-03 ENCOUNTER — Ambulatory Visit (INDEPENDENT_AMBULATORY_CARE_PROVIDER_SITE_OTHER): Payer: Medicare PPO | Admitting: Family Medicine

## 2020-11-03 ENCOUNTER — Encounter (INDEPENDENT_AMBULATORY_CARE_PROVIDER_SITE_OTHER): Payer: Self-pay | Admitting: Family Medicine

## 2020-11-03 ENCOUNTER — Other Ambulatory Visit: Payer: Self-pay

## 2020-11-03 ENCOUNTER — Other Ambulatory Visit: Payer: Self-pay | Admitting: Cardiovascular Disease

## 2020-11-03 VITALS — BP 93/50 | HR 57 | Temp 97.6°F | Ht 68.0 in | Wt 278.0 lb

## 2020-11-03 DIAGNOSIS — E1165 Type 2 diabetes mellitus with hyperglycemia: Secondary | ICD-10-CM

## 2020-11-03 DIAGNOSIS — I10 Essential (primary) hypertension: Secondary | ICD-10-CM

## 2020-11-03 DIAGNOSIS — Z6841 Body Mass Index (BMI) 40.0 and over, adult: Secondary | ICD-10-CM

## 2020-11-07 ENCOUNTER — Inpatient Hospital Stay: Payer: Medicare PPO

## 2020-11-07 ENCOUNTER — Other Ambulatory Visit: Payer: Self-pay

## 2020-11-07 DIAGNOSIS — D751 Secondary polycythemia: Secondary | ICD-10-CM

## 2020-11-07 DIAGNOSIS — Z87891 Personal history of nicotine dependence: Secondary | ICD-10-CM | POA: Diagnosis not present

## 2020-11-07 DIAGNOSIS — I10 Essential (primary) hypertension: Secondary | ICD-10-CM | POA: Diagnosis not present

## 2020-11-07 DIAGNOSIS — E669 Obesity, unspecified: Secondary | ICD-10-CM | POA: Diagnosis not present

## 2020-11-07 DIAGNOSIS — E119 Type 2 diabetes mellitus without complications: Secondary | ICD-10-CM | POA: Diagnosis not present

## 2020-11-07 DIAGNOSIS — E291 Testicular hypofunction: Secondary | ICD-10-CM | POA: Diagnosis not present

## 2020-11-07 DIAGNOSIS — G4733 Obstructive sleep apnea (adult) (pediatric): Secondary | ICD-10-CM | POA: Diagnosis not present

## 2020-11-07 LAB — CBC WITH DIFFERENTIAL/PLATELET
Abs Immature Granulocytes: 0.02 10*3/uL (ref 0.00–0.07)
Basophils Absolute: 0 10*3/uL (ref 0.0–0.1)
Basophils Relative: 0 %
Eosinophils Absolute: 0.4 10*3/uL (ref 0.0–0.5)
Eosinophils Relative: 4 %
HCT: 48.7 % (ref 39.0–52.0)
Hemoglobin: 15.7 g/dL (ref 13.0–17.0)
Immature Granulocytes: 0 %
Lymphocytes Relative: 14 %
Lymphs Abs: 1.3 10*3/uL (ref 0.7–4.0)
MCH: 29.7 pg (ref 26.0–34.0)
MCHC: 32.2 g/dL (ref 30.0–36.0)
MCV: 92.2 fL (ref 80.0–100.0)
Monocytes Absolute: 0.8 10*3/uL (ref 0.1–1.0)
Monocytes Relative: 9 %
Neutro Abs: 6.8 10*3/uL (ref 1.7–7.7)
Neutrophils Relative %: 73 %
Platelets: 187 10*3/uL (ref 150–400)
RBC: 5.28 MIL/uL (ref 4.22–5.81)
RDW: 14.6 % (ref 11.5–15.5)
WBC: 9.4 10*3/uL (ref 4.0–10.5)
nRBC: 0 % (ref 0.0–0.2)

## 2020-11-07 NOTE — Progress Notes (Signed)
Per Dr. Chryl Heck, hold phlebotomy today due to HCT < 51.  VSS.  Patient c/o of no symptoms or discomfort.

## 2020-11-07 NOTE — Progress Notes (Signed)
Chief Complaint:   OBESITY David Brandt is here to discuss his progress with his obesity treatment plan along with follow-up of his obesity related diagnoses. David Brandt is on the Category 3 Plan and states he is following his eating plan approximately 90% of the time. David Brandt states he is going to the gym and doing yard work 60 minutes 3-4 times per week.  Today's visit was #: 6 Starting weight: 302 lbs Starting date: 07/25/2020 Today's weight: 278 lbs Today's date: 11/03/2020 Total lbs lost to date: 24 Total lbs lost since last in-office visit: 0  Interim History: David Brandt has been doing a significant amount of yard work over the last few weeks. He has been planting a good amount of vegetables. He hasn't been eating as plan dictates. Eating pork and protein bars. Breakfast has been a couple of eggs, sausage, 2 slices 45 cal bread; lunch is occasionally a hamburger, yogurt, fruit; dinner is steaks, vegetables. He feels better when eating more protein. Eating yogurt, watermelon, cantaloupe, strawberries for snacks. Eating Atkins protein bars snacks. Going to the beach the week after next.  Subjective:   1. Essential hypertension BP is borderline low today. David Brandt sees cardiology. He denies dizziness or faintness.   2. Type 2 diabetes mellitus with hyperglycemia, without long-term current use of insulin (HCC) David Brandt is on Metformin. He denies GI side effects.  Assessment/Plan:   1. Essential hypertension David Brandt is working on healthy weight loss and exercise to improve blood pressure control. We will watch for signs of hypotension as he continues his lifestyle modifications. Follow up with Dr. Oval Linsey at next appointment (next week).  2. Type 2 diabetes mellitus with hyperglycemia, without long-term current use of insulin (HCC) Good blood sugar control is important to decrease the likelihood of diabetic complications such as nephropathy, neuropathy, limb loss, blindness, coronary artery disease,  and death. Intensive lifestyle modification including diet, exercise and weight loss are the first line of treatment for diabetes. Continue Metformin. Pt denies need for refill today. Repeat labs at next appt.  3. Class 3 severe obesity with serious comorbidity and body mass index (BMI) of 40.0 to 44.9 in adult, unspecified obesity type (HCC)  David Brandt is currently in the action stage of change. As such, his goal is to continue with weight loss efforts. He has agreed to the Category 3 Plan.   Exercise goals: As is  Behavioral modification strategies: increasing lean protein intake, meal planning and cooking strategies, keeping healthy foods in the home and planning for success.  David Brandt has agreed to follow-up with our clinic in 3 weeks. He was informed of the importance of frequent follow-up visits to maximize his success with intensive lifestyle modifications for his multiple health conditions.   Objective:   Blood pressure (!) 93/50, pulse (!) 57, temperature 97.6 F (36.4 C), height 5\' 8"  (1.727 m), weight 278 lb (126.1 kg), SpO2 97 %. Body mass index is 42.27 kg/m.  General: Cooperative, alert, well developed, in no acute distress. HEENT: Conjunctivae and lids unremarkable. Cardiovascular: Regular rhythm.  Lungs: Normal work of breathing. Neurologic: No focal deficits.   Lab Results  Component Value Date   CREATININE 1.21 09/08/2020   BUN 20 09/08/2020   NA 133 (L) 09/08/2020   K 4.5 09/08/2020   CL 98 09/08/2020   CO2 28 09/08/2020   Lab Results  Component Value Date   ALT 24 09/08/2020   AST 20 09/08/2020   ALKPHOS 88 09/08/2020   BILITOT 1.0 09/08/2020  Lab Results  Component Value Date   HGBA1C 5.8 (A) 06/03/2020   HGBA1C 6.8 (H) 04/21/2019   HGBA1C 6.0 (A) 03/06/2018   HGBA1C 6.8 (H) 12/06/2017   HGBA1C 6.3 (H) 03/31/2016   Lab Results  Component Value Date   INSULIN 22.6 07/25/2020   Lab Results  Component Value Date   TSH 2.050 07/25/2020   Lab  Results  Component Value Date   CHOL 115 07/25/2020   HDL 39 (L) 07/25/2020   LDLCALC 44 07/25/2020   TRIG 194 (H) 07/25/2020   CHOLHDL 3.0 04/14/2020   Lab Results  Component Value Date   WBC 9.6 10/24/2020   HGB 17.1 (H) 10/24/2020   HCT 52.3 (H) 10/24/2020   MCV 90.8 10/24/2020   PLT 190 10/24/2020    Attestation Statements:   Reviewed by clinician on day of visit: allergies, medications, problem list, medical history, surgical history, family history, social history, and previous encounter notes.  Time spent on visit including pre-visit chart review and post-visit care and charting was 15 minutes.   Coral Ceo, CMA, am acting as transcriptionist for Coralie Common, MD.   .I have reviewed the above documentation for accuracy and completeness, and I agree with the above. - Jinny Blossom, MD

## 2020-11-21 NOTE — Progress Notes (Signed)
Cardiology Clinic Note   Patient Name: David Brandt Date of Encounter: 11/23/2020  Primary Care Provider:  [name], MD Primary Cardiologist:  David Latch, MD  Patient Profile    David Brandt 75 year old male presents the clinic today for follow-up evaluation of his essential hypertension, HLD, and CAD status post CABG x5.  Past Medical History    Past Medical History:  Diagnosis Date  . Abnormal cardiovascular stress test 03/29/2016  . Allergic rhinitis   . Allergy   . Arthritis   . Asthma   . Back pain   . Diabetes ([place]) 03/29/2016  . Gout   . History of kidney stones   . Hyperlipidemia   . Hypertension   . Joint pain   . Multiple food allergies   . Prediabetes   . Sleep apnea    cpap   Past Surgical History:  Procedure Laterality Date  . CARDIAC CATHETERIZATION N/A 03/29/2016   Procedure: Right/Left Heart Cath and Coronary Angiography;  Surgeon: David Bush, MD;  Location: [facility];  Service: Cardiovascular;  Laterality: N/A;  . CARDIAC CATHETERIZATION N/A 03/29/2016   Procedure: Intravascular Pressure Wire/FFR Study;  Surgeon: David Bush, MD;  Location: [facility];  Service: Cardiovascular;  Laterality: N/A;  . CARDIAC CATHETERIZATION Left 04/02/2016   Procedure: FEMORAL ARTERIAL LINE INSERTION;  Surgeon: David Isaac, MD;  Location: [facility];  Service: Open Heart Surgery;  Laterality: Left;  . COLONOSCOPY  02/20/2018   per David Brandt, no polyps, repeat in 5 yrs (previous adenomatous polyps)   . CORONARY ARTERY BYPASS GRAFT N/A 04/02/2016   Procedure: CORONARY ARTERY BYPASS GRAFTING (CABG) X 5 UTILIZING LEFT INTERNAL MAMMARY ARTERY AND ENDOSCOPICALLY HARVESTED GREATER SAPHENOUS VEIN ; Mammary to LAD, Sequencial 1st to 2nd Diagonal, Vein to OM, Vein to Distal RCA.;  Surgeon: David Isaac, MD;  Location: [facility];  Service: Open Heart Surgery;  Laterality: N/A;  . CYSTOSCOPY/URETEROSCOPY/HOLMIUM LASER/STENT PLACEMENT  Bilateral 05/07/2019   Procedure: LEFT URETEROSCOPY/HOLMIUM LASER/STENT PLACEMENT/BIOPSY LEFT MID URETER/RIGHT RETROGRADE STENT PLACEMENT ;  Surgeon: David Hughs, MD;  Location: [facility];  Service: Urology;  Laterality: Bilateral;  . CYSTOSCOPY/URETEROSCOPY/HOLMIUM LASER/STENT PLACEMENT Bilateral 05/27/2019   Procedure: CYSTOSCOPY, URETEROSCOPY/ RETROGRADE PYELOGRAM/ HOLMIUM LASER/STENT EXCHANGE;  Surgeon: David Hughs, MD;  Location: [facility];  Service: Urology;  Laterality: Bilateral;  . LYMPH NODE BIOPSY Left 04/02/2016   Procedure: INCIDENTAL LEFT MAMMARY LYMPH NODE BIOPSY;  Surgeon: David Isaac, MD;  Location: [facility];  Service: Open Heart Surgery;  Laterality: Left;  . mass abdomen  1955  . NEPHROLITHOTOMY Left 04/24/2019   Procedure: NEPHROLITHOTOMY PERCUTANEOUS WITH SURGEON ACCESS;  Surgeon: David Hughs, MD;  Location: [facility];  Service: Urology;  Laterality: Left;  . TEE WITHOUT CARDIOVERSION N/A 04/02/2016   Procedure: TRANSESOPHAGEAL ECHOCARDIOGRAM (TEE);  Surgeon: David Isaac, MD;  Location: [facility];  Service: Open Heart Surgery;  Laterality: N/A;    Allergies  Allergies  Allergen Reactions  . Allopurinol     Joint pain   . Justicia Adhatoda (Malabar Nut Tree) [Justicia Adhatoda]     Allergic to Walnuts, hickory nuts, and almonds  . Valsartan-Hydrochlorothiazide     Lip swelling   . Sulfa Antibiotics Hives    History of Present Illness    David Brandt has a PMH of coronary artery disease status post CABG x5 (LIMA-->LAD, SVG-->D1,D2, SVG-->OM, SVG-->RCA) , diabetes, OSA on CPAP, and postoperative atrial fibrillation.  His PMH also includes HTN, HLD, and  emphysema.  He was initially seen by cardiology with chest pressure and shortness of breath 10/17.  He underwent nuclear stress test on 03/28/2016 which showed an EF of 48% and large inferior/inferior lateral MI with no evidence of ischemia.  There was no basal to mid inferior wall akinesis.  He  underwent cardiac catheterization and was found to have severe three-vessel coronary artery disease.  He underwent CABG by David Brandt on 04/02/2016.  He developed postoperative atrial fibrillation and was started on amiodarone.  He had no other postoperative complications and was discharged on 04/11/2016.  His echocardiogram 03/30/2016 showed an LVEF of 55-60% and was otherwise unremarkable.  His amiodarone was discontinued on 2/18.  Hydralazine was added to his medication regimen.  He reported that he did occasionally have palpitations with the medication taking it 3 times a day.  Medication changed to twice daily symptoms are much better.  He noted that he had a 12.5 mg HCTZ that was prescribed by his PCP and a 25 mg HCTZ that was prescribed by David Brandt.  He had been taking both prescriptions.  He did note increased urination but indicated he was feeling well.  He indicated that he needed to have surgery on his back but was instructed to lose 50 pounds before he would be able to have the procedure.  He was limiting his calorie intake/portions and trying to walk daily.    He was last seen by David Brandt on 04/11/2020.  During that time he denied exertional chest pain.  He reported that he did have some residual shortness of breath.  He reported that it started after having COVID-19 infection 2/21.  He did not feel he had fully recovered.  He denied lower extremity swelling and orthopnea.  He reported he was moving more and had been working his yard.  He continued to struggle with back pain.  He reported less back pain with weight loss.  He continued to limit his portions.  He presents to the clinic today for follow-up evaluation states he feels well.  He continues to lose weight.  He is now 279 pounds down from 325 pounds.  He reports that his back pain is much improved.  He has been working on eating smaller portions.  His wife was also overweight and she has lost a significant amount of weight as well  through limiting her portion size.  We reviewed his blood pressure and blood pressure medications.  Blood pressure today is 92/58.  He denies lightheadedness/dizziness, presyncope and syncope.  His main complaint is numbness in his bilateral toes.  We discussed that this may be diabetic neuropathy and I recommended that he follow-up with his PCP.  He reports that [place] [facility]  I will have him follow-up in 6 to 8 months, give a salty 6 diet sheet, have him continue his diet, and increase his physical activity.  Today he denies chest pain, shortness of breath, lower extremity edema, fatigue, palpitations, melena, hematuria, hemoptysis, diaphoresis, weakness, presyncope, syncope, orthopnea, and PND.   Home Medications    Prior to Admission medications   Medication Sig Start Date End Date Taking? Authorizing Provider  Alcohol Swabs (B-D SINGLE USE SWABS REGULAR) PADS Use as directed. 07/21/20   David Morale, MD  Apoaequorin (PREVAGEN PO) Take 20 mg by mouth.    [provider]  aspirin EC 81 MG tablet Take 81 mg by mouth daily.    [provider]  atorvastatin (LIPITOR) 80 MG tablet TAKE 1 TABLET (80 MG TOTAL) BY MOUTH DAILY AT 6 PM. 09/19/20   David Latch, MD  Blood Glucose Calibration (TRUE METRIX LEVEL 1) Low SOLN Use as directed 07/15/20   David Morale, MD  Blood Glucose Monitoring Suppl (TRUE METRIX METER) DEVI Use as directed 07/15/20   David Morale, MD  carvedilol (COREG) 25 MG tablet TAKE 1 TABLET TWICE DAILY 02/24/20   David Latch, MD  celecoxib (CELEBREX) 200 MG capsule  04/14/20   [provider]  Cholecalciferol (VITAMIN D3) 25 MCG (1000 UT) CAPS Take 2,000 Units by mouth daily.    [provider]  felodipine (PLENDIL) 10 MG 24 hr tablet TAKE 1 TABLET BY MOUTH EVERY DAY 11/10/19   David Latch, MD  glucose blood (TRUE METRIX BLOOD GLUCOSE TEST) test strip Use as instructed 07/21/20   David Morale,  MD  hydrALAZINE (APRESOLINE) 50 MG tablet Take 1 tablet (50 mg total) by mouth 2 (two) times daily. 07/19/20   David Latch, MD  hydrochlorothiazide (HYDRODIURIL) 25 MG tablet TAKE 1 TABLET EVERY DAY (NEED MD APPOINTMENT FOR REFILLS) 11/02/20   David Latch, MD  metFORMIN (GLUCOPHAGE-XR) 500 MG 24 hr tablet Take 1 tablet (500 mg total) by mouth daily. 07/21/20   David Morale, MD  OVER THE COUNTER MEDICATION Take 2 capsules by mouth 2 (two) times daily. OmegaXL    [provider]  POTASSIUM CITRATE PO Take 15 mEq by mouth.    [provider]  spironolactone (ALDACTONE) 25 MG tablet TAKE 1 TABLET EVERY DAY 11/03/20   David Latch, MD  [facility] 160-4.5 MCG/ACT inhaler TAKE 2 PUFFS BY MOUTH TWICE A DAY 01/18/20   David Morale, MD  TRUEplus Lancets 33G MISC Use to check Blood Sugars daily 09/19/20   David Morale, MD    Family History    Family History  Problem Relation Age of Onset  . Colon cancer Father 52  . Cancer Father   . Alcohol abuse Father   . Stroke Mother   . Diabetes Mother   . Hypertension Mother   . Hyperlipidemia Mother   . Obesity Mother   . Heart disease Maternal Aunt   . Diabetes Maternal Uncle   . Stomach cancer Neg Hx   . Esophageal cancer Neg Hx   . Rectal cancer Neg Hx    He indicated that the status of his mother is unknown. He indicated that his father is deceased. He indicated that the status of his maternal aunt is unknown. He indicated that the status of his maternal uncle is unknown. He indicated that the status of his neg hx is unknown.  Social History    Social History   Socioeconomic History  . Marital status: Married    Spouse name: Hassan Rowan  . Number of children: Y  . Years of education: Not on file  . Highest education level: Not on file  Occupational History  . Not on file  Tobacco Use  . Smoking status: Former Smoker    Packs/day: 1.00    Years: 47.00    Pack years: 47.00    Types: Cigarettes    Quit date:  06/25/2018    Years since quitting: 2.4  . Smokeless tobacco: Never Used  Vaping Use  . Vaping Use: Never used  Substance and Sexual Activity  . Alcohol use: Yes    Alcohol/week: 4.0 standard drinks    Types: 4 Cans of beer per week  .  Drug use: Yes    Frequency: 7.0 times per week    Types: Marijuana    Comment: marijuana-states still smoking 10/19/2020  . Sexual activity: Not on file  Other Topics Concern  . Not on file  Social History Narrative  . Not on file   Social Determinants of Health   Financial Resource Strain: Low Risk   . Difficulty of Paying Living Expenses: Not hard at all  Food Insecurity: No Food Insecurity  . Worried About Charity fundraiser in the Last Year: Never true  . Ran Out of Food in the Last Year: Never true  Transportation Needs: No Transportation Needs  . Lack of Transportation (Medical): No  . Lack of Transportation (Non-Medical): No  Physical Activity: Insufficiently Active  . Days of Exercise per Week: 2 days  . Minutes of Exercise per Session: 60 min  Stress: No Stress Concern Present  . Feeling of Stress : Not at all  Social Connections: Moderately Integrated  . Frequency of Communication with Friends and Family: More than three times a week  . Frequency of Social Gatherings with Friends and Family: More than three times a week  . Attends Religious Services: 1 to 4 times per year  . Active Member of Clubs or Organizations: No  . Attends Archivist Meetings: Never  . Marital Status: Married  Human resources officer Violence: Not At Risk  . Fear of Current or Ex-Partner: No  . Emotionally Abused: No  . Physically Abused: No  . Sexually Abused: No     Review of Systems    General:  No chills, fever, night sweats or weight changes.  Cardiovascular:  No chest pain, dyspnea on exertion, edema, orthopnea, palpitations, paroxysmal nocturnal dyspnea. Dermatological: No rash, lesions/masses Respiratory: No cough, dyspnea Urologic: No  hematuria, dysuria Abdominal:   No nausea, vomiting, diarrhea, bright red blood per rectum, melena, or hematemesis Neurologic:  No visual changes, wkns, changes in mental status. All other systems reviewed and are otherwise negative except as noted above.  Physical Exam    VS:  BP (!) 92/58   Pulse (!) 55   Ht 5\' 8"  (1.727 m)   Wt 279 lb (126.6 kg)   SpO2 96%   BMI 42.42 kg/m  , BMI Body mass index is 42.42 kg/m. GEN: Well nourished, well developed, in no acute distress. HEENT: normal. Neck: Supple, no JVD, carotid bruits, or masses. Cardiac: RRR, no murmurs, rubs, or gallops. No clubbing, cyanosis, edema.  Radials/DP/PT 2+ and equal bilaterally.  Respiratory:  Respirations regular and unlabored, clear to auscultation bilaterally. GI: Soft, nontender, nondistended, BS + x 4. MS: no deformity or atrophy. Skin: warm and dry, no rash. Neuro:  Strength and sensation are intact.  Numbness bilateral feet/toes. Psych: Normal affect.  Accessory Clinical Findings    Recent Labs: 07/25/2020: TSH 2.050 09/08/2020: ALT 24; BUN 20; Creatinine 1.21; Potassium 4.5; Sodium 133 11/07/2020: Hemoglobin 15.7; Platelets 187   Recent Lipid Panel    Component Value Date/Time   CHOL 115 07/25/2020 1134   TRIG 194 (H) 07/25/2020 1134   HDL 39 (L) 07/25/2020 1134   CHOLHDL 3.0 04/14/2020 1100   CHOLHDL 4.1 03/30/2016 0156   VLDL 59 (H) 03/30/2016 0156   LDLCALC 44 07/25/2020 1134    ECG personally reviewed by me today-none today.  Cardiac catheterization 03/29/2016 Conclusions: 1. Severe 3 vessel coronary artery disease, as detailed below, including 50% distal LMCA/ostial LAD stenoses (FFR 0.70), 80% D1 lesion, 80% ostial LCx stenosis,  and diffusely diseased RCA with 99% mid RCA stenosis with TIMI-1 flow and competitive filling of the PDA and rPL branches by left-to-right collaterals. 2. Mildly decreased LV systolic function with inferior akinesis (LVEF 45-50%) 3. Upper normal to mildly elevated  left heart filling pressures. 4. Reduced Fick cardiac output.  Recommendations: 1. Transfer to 3W-stepdown for monitoring in the setting of unstable angina with severe three-vessel CAD.  Initiate heparin infusion 4 hours after TR band deflated. 2. Cardiac surgery consultation in AM to evaluate for CABG. 3. Consider viability study to further assess the inferior wall prior to revascularization. 4. Transthoracic echocardiogram to evaluate for significant valvular disease. 5. Continue ASA and metoprolol; will increase atorvastatin to 80 mg daily.  Diagnostic Dominance: Right    Intervention    Echocardiogram 03/30/2019 IMPRESSIONS    1. Left ventricular ejection fraction, by visual estimation, is 65 to  70%. The left ventricle has normal function. Normal left ventricular size.  There is no left ventricular hypertrophy.  2. Left ventricular diastolic Doppler parameters are consistent with  pseudonormalization pattern of LV diastolic filling.  3. Global right ventricle has normal systolic function.The right  ventricular size is normal. No increase in right ventricular wall  thickness.  4. Left atrial size was normal.  5. Right atrial size was normal.  6. The mitral valve is normal in structure. No evidence of mitral valve  regurgitation. No evidence of mitral stenosis.  7. The tricuspid valve is normal in structure. Tricuspid valve  regurgitation was not visualized by color flow Doppler.  8. The aortic valve is normal in structure. Aortic valve regurgitation  was not visualized by color flow Doppler. Mild aortic valve sclerosis  without stenosis.  9. The pulmonic valve was normal in structure. Pulmonic valve  regurgitation is not visualized by color flow Doppler.  10. The inferior vena cava is normal in size with greater than 50%  respiratory variability, suggesting right atrial pressure of 3 mmHg.   In comparison to the previous echocardiogram(s): 12/17/17 EF 55-60%.   FINDINGS  Left Ventricle: Left ventricular ejection fraction, by visual estimation,  is 65 to 70%. The left ventricle has normal function. There is no left  ventricular hypertrophy. Normal left ventricular size. Spectral Doppler  shows Left ventricular diastolic  Doppler parameters are consistent with pseudonormalization pattern of LV  diastolic filling.   Right Ventricle: The right ventricular size is normal. No increase in  right ventricular wall thickness. Global RV systolic function is has  normal systolic function.   Left Atrium: Left atrial size was normal in size.   Right Atrium: Right atrial size was normal in size   Pericardium: There is no evidence of pericardial effusion.   Mitral Valve: The mitral valve is normal in structure. No evidence of  mitral valve stenosis by observation. No evidence of mitral valve  regurgitation.   Tricuspid Valve: The tricuspid valve is normal in structure. Tricuspid  valve regurgitation was not visualized by color flow Doppler.   Aortic Valve: The aortic valve is normal in structure. Aortic valve  regurgitation was not visualized by color flow Doppler. Mild aortic valve  sclerosis is present, with no evidence of aortic valve stenosis.   Pulmonic Valve: The pulmonic valve was normal in structure. Pulmonic valve  regurgitation is not visualized by color flow Doppler.   Aorta: The aortic root, ascending aorta and aortic arch are all  structurally normal, with no evidence of dilitation or obstruction.   Venous: The inferior vena cava is  normal in size with greater than 50%  respiratory variability, suggesting right atrial pressure of 3 mmHg.   IAS/Shunts: No atrial level shunt detected by color flow Doppler. No  ventricular septal defect is seen or detected. There is no evidence of an  atrial septal defect.   Assessment & Plan   1.  Coronary artery disease- no chest pain today.  Status post CABG  x5 (LIMA-->LAD, SVG-->D1,D2,  SVG-->OM, SVG-->RCA by David Brandt 10/17.  Underwent echocardiogram and nuclear stress test 10/20 which were unremarkable. Continue aspirin, atorvastatin, carvedilol Heart healthy low-sodium diet-salty 6 given Increase physical activity as tolerated  Hyperlipidemia-07/25/2020: Cholesterol, Total 115; HDL 39; LDL Chol Calc (NIH) 44; Triglycerides 194 Continue atorvastatin Heart healthy low-sodium high-fiber diet Increase physical activity as tolerated  Essential hypertension-BP today 92/58.  Well-controlled at home. Continue carvedilol, HCTZ, spironolactone Reduce hydralazine 25 mg twice daily Heart healthy low-sodium diet-salty 6 given Increase physical activity as tolerated  Postoperative atrial fibrillation- no recent episodes of irregular heartbeat or increased/accelerated heartbeats. Continue carvedilol Heart healthy low-sodium diet-salty 6 given Increase physical activity as tolerated  Morbid obesity-weight today 279.  Continues to limit portions and increase physical activity.  Has noticed decrease back pain. Continue weight loss  OSA- reports compliance with CPAP. Continue CPAP use  Disposition: Follow-up with David Brandt or me in 6-8 months.   Jossie Ng. Bertice Risse NP-C    11/23/2020, 12:16 PM [address] Office [phone] Fax [phone]  Notice: This dictation was prepared with Dragon dictation along with smaller phrase technology. Any transcriptional errors that result from this process are unintentional and may not be corrected upon review.  I spent 15 minutes examining this patient, reviewing medications, and using patient centered shared decision making involving her cardiac care.  Prior to her visit I spent greater than 20 minutes reviewing her past medical history,  medications, and prior cardiac tests.

## 2020-11-22 ENCOUNTER — Other Ambulatory Visit: Payer: Medicare PPO

## 2020-11-22 DIAGNOSIS — E119 Type 2 diabetes mellitus without complications: Secondary | ICD-10-CM | POA: Diagnosis not present

## 2020-11-22 DIAGNOSIS — H16223 Keratoconjunctivitis sicca, not specified as Sjogren's, bilateral: Secondary | ICD-10-CM | POA: Diagnosis not present

## 2020-11-22 DIAGNOSIS — H2513 Age-related nuclear cataract, bilateral: Secondary | ICD-10-CM | POA: Diagnosis not present

## 2020-11-22 LAB — HM DIABETES EYE EXAM

## 2020-11-23 ENCOUNTER — Encounter: Payer: Self-pay | Admitting: Family Medicine

## 2020-11-23 ENCOUNTER — Encounter: Payer: Self-pay | Admitting: General Practice

## 2020-11-23 ENCOUNTER — Ambulatory Visit: Payer: Medicare PPO | Admitting: General Practice

## 2020-11-23 ENCOUNTER — Other Ambulatory Visit: Payer: Self-pay

## 2020-11-23 ENCOUNTER — Ambulatory Visit: Payer: Medicare PPO | Admitting: Family Medicine

## 2020-11-23 VITALS — BP 92/58 | HR 55 | Ht 68.0 in | Wt 279.0 lb

## 2020-11-23 VITALS — BP 98/60 | HR 73 | Temp 98.0°F | Wt 280.0 lb

## 2020-11-23 DIAGNOSIS — Z951 Presence of aortocoronary bypass graft: Secondary | ICD-10-CM

## 2020-11-23 DIAGNOSIS — I10 Essential (primary) hypertension: Secondary | ICD-10-CM | POA: Diagnosis not present

## 2020-11-23 DIAGNOSIS — I2510 Atherosclerotic heart disease of native coronary artery without angina pectoris: Secondary | ICD-10-CM | POA: Diagnosis not present

## 2020-11-23 DIAGNOSIS — E1140 Type 2 diabetes mellitus with diabetic neuropathy, unspecified: Secondary | ICD-10-CM | POA: Diagnosis not present

## 2020-11-23 DIAGNOSIS — I4891 Unspecified atrial fibrillation: Secondary | ICD-10-CM | POA: Diagnosis not present

## 2020-11-23 DIAGNOSIS — G4733 Obstructive sleep apnea (adult) (pediatric): Secondary | ICD-10-CM

## 2020-11-23 DIAGNOSIS — E1169 Type 2 diabetes mellitus with other specified complication: Secondary | ICD-10-CM

## 2020-11-23 DIAGNOSIS — E7800 Pure hypercholesterolemia, unspecified: Secondary | ICD-10-CM | POA: Diagnosis not present

## 2020-11-23 MED ORDER — HYDRALAZINE HCL 25 MG PO TABS: 25.0000 mg | ORAL_TABLET | Freq: Two times a day (BID) | ORAL | 1 refills | Status: DC

## 2020-11-23 MED ORDER — GABAPENTIN 100 MG PO CAPS: 100.0000 mg | ORAL_CAPSULE | Freq: Three times a day (TID) | ORAL | 3 refills | Status: DC

## 2020-11-23 NOTE — Patient Instructions (Addendum)
Medication Instructions:  REDUCE hydralazine to 25mg  twice daily. Per Coletta Memos, NP you may take one half tablet of your 50mg  hydralazine (25mg ) twice daily until you need your refill.   *If you need a refill on your cardiac medications before your next appointment, please call your pharmacy*   Lab Work: None ordered.   Testing/Procedures: None ordered.    Follow-Up: At [facility], you and your health needs are our priority.  As part of our continuing mission to provide you with exceptional heart care, we have created designated Provider Care Teams.  These Care Teams include your primary Cardiologist (physician) and Advanced Practice Providers (APPs -  Physician Assistants and Nurse Practitioners) who all work together to provide you with the care you need, when you need it.  We recommend signing up for the patient portal called "MyChart".  Sign up information is provided on this After Visit Summary.  MyChart is used to connect with patients for Virtual Visits (Telemedicine).  Patients are able to view lab/test results, encounter notes, upcoming appointments, etc.  Non-urgent messages can be sent to your provider as well.   To learn more about what you can do with MyChart, go to [email or web address].    Your next appointment:   6-8 month(s)  The format for your next appointment:   In Person  Provider:   Skeet Latch, MD   Other Instructions Please review the "salty six" information sheet provided.  Per Coletta Memos, NP increase your physical activity. Please follow up with your primary care provider for diabetic neuropathy.

## 2020-11-23 NOTE — Progress Notes (Signed)
   Subjective:    Patient ID: David Brandt, male    DOB: [birth year], 74 y.o.   MRN: [id number]  HPI Here for numbness and burning and tingling in both feet. This started in the right foot several years ago and it has spread to both feet now. The discomfort is to the point he wants to do something about it. Nothing in the hands. His diabetes has been well controlled. His last A1c in December was 5.8. He has lost a lot of weight in the past year by cutting back on his food portions. He saw the cardiology clinic this morning and they told him his circulation was fine.   Review of Systems  Constitutional: Negative.   Respiratory: Negative.   Cardiovascular: Negative.   Neurological: Positive for numbness.       Objective:   Physical Exam Constitutional:      Appearance: Normal appearance.  Cardiovascular:     Rate and Rhythm: Normal rate and regular rhythm.     Pulses: Normal pulses.     Heart sounds: Normal heart sounds.  Pulmonary:     Effort: Pulmonary effort is normal.     Breath sounds: Normal breath sounds.  Skin:    General: Skin is warm and dry.  Neurological:     Mental Status: He is alert.           Assessment & Plan:  Hie has neuropathy, likely due to the diabetes. We will check an A1c today. He will try Gabapentin 100 mg TID, and recheck in 3-4 weeks.  Alysia Penna, MD

## 2020-11-24 LAB — HEMOGLOBIN A1C: Hgb A1c MFr Bld: 6.2 % (ref 4.6–6.5)

## 2020-11-29 DIAGNOSIS — G4731 Primary central sleep apnea: Secondary | ICD-10-CM | POA: Diagnosis not present

## 2020-11-29 DIAGNOSIS — G4733 Obstructive sleep apnea (adult) (pediatric): Secondary | ICD-10-CM | POA: Diagnosis not present

## 2020-12-01 ENCOUNTER — Other Ambulatory Visit: Payer: Self-pay | Admitting: Cardiovascular Disease

## 2020-12-03 ENCOUNTER — Encounter (INDEPENDENT_AMBULATORY_CARE_PROVIDER_SITE_OTHER): Payer: Self-pay

## 2020-12-05 ENCOUNTER — Other Ambulatory Visit: Payer: Self-pay

## 2020-12-05 ENCOUNTER — Inpatient Hospital Stay: Payer: Medicare PPO

## 2020-12-05 ENCOUNTER — Encounter: Payer: Self-pay | Admitting: *Deleted

## 2020-12-05 ENCOUNTER — Ambulatory Visit (INDEPENDENT_AMBULATORY_CARE_PROVIDER_SITE_OTHER): Payer: Medicare PPO | Admitting: Family Medicine

## 2020-12-05 ENCOUNTER — Inpatient Hospital Stay: Payer: Medicare PPO | Attending: Hematology and Oncology

## 2020-12-05 DIAGNOSIS — D751 Secondary polycythemia: Secondary | ICD-10-CM | POA: Diagnosis not present

## 2020-12-05 LAB — CBC WITH DIFFERENTIAL/PLATELET
Abs Immature Granulocytes: 0.04 10*3/uL (ref 0.00–0.07)
Basophils Absolute: 0.1 10*3/uL (ref 0.0–0.1)
Basophils Relative: 1 %
Eosinophils Absolute: 0.3 10*3/uL (ref 0.0–0.5)
Eosinophils Relative: 2 %
HCT: 46.8 % (ref 39.0–52.0)
Hemoglobin: 15.4 g/dL (ref 13.0–17.0)
Immature Granulocytes: 0 %
Lymphocytes Relative: 16 %
Lymphs Abs: 1.7 10*3/uL (ref 0.7–4.0)
MCH: 30.1 pg (ref 26.0–34.0)
MCHC: 32.9 g/dL (ref 30.0–36.0)
MCV: 91.6 fL (ref 80.0–100.0)
Monocytes Absolute: 1.4 10*3/uL — ABNORMAL HIGH (ref 0.1–1.0)
Monocytes Relative: 13 %
Neutro Abs: 6.8 10*3/uL (ref 1.7–7.7)
Neutrophils Relative %: 68 %
Platelets: 209 10*3/uL (ref 150–400)
RBC: 5.11 MIL/uL (ref 4.22–5.81)
RDW: 15.3 % (ref 11.5–15.5)
WBC: 10.2 10*3/uL (ref 4.0–10.5)
nRBC: 0 % (ref 0.0–0.2)

## 2020-12-05 NOTE — Progress Notes (Signed)
Per parameters, pt does not need phlebotomy today.  HCT 46, only perform if greater than 51.  Copy of labs given to pt.  Pt verbalized understanding.

## 2020-12-06 ENCOUNTER — Ambulatory Visit (INDEPENDENT_AMBULATORY_CARE_PROVIDER_SITE_OTHER): Payer: Medicare PPO | Admitting: Family Medicine

## 2020-12-06 VITALS — BP 96/56 | HR 55 | Temp 98.3°F | Ht 68.0 in | Wt 279.0 lb

## 2020-12-06 DIAGNOSIS — E1169 Type 2 diabetes mellitus with other specified complication: Secondary | ICD-10-CM | POA: Diagnosis not present

## 2020-12-06 DIAGNOSIS — Z6841 Body Mass Index (BMI) 40.0 and over, adult: Secondary | ICD-10-CM

## 2020-12-06 MED ORDER — METFORMIN HCL ER 500 MG PO TB24: ORAL_TABLET | ORAL | 3 refills | Status: DC

## 2020-12-13 NOTE — Progress Notes (Signed)
Chief Complaint:   OBESITY David Brandt is here to discuss his progress with his obesity treatment plan along with follow-up of his obesity related diagnoses.   Today's visit was #: 7 Starting weight: 302 lbs Starting date: 07/25/2020 Today's weight: 279 lbs Today's date: 12/06/2020 Weight change since last visit: +1 lbs Total lbs lost to date: 23 lbs Body mass index is 42.42 kg/m.  Total weight loss percentage to date: -7.62%  Interim History:  David Brandt's last office visit with Dr. Jearld Brandt, 1 month ago.  This is his first office visit with me.  He was on vacation in [place] for 5 days recently.  Ate a lot off plan and gained 2 pounds in water weight.  Unable to eat all his protein during the day and night.    Current Meal Plan: the Category 3 Plan for 40% of the time.  Current Exercise Plan: Working in the garden.   Assessment/Plan:   Medications Discontinued During This Encounter  Medication Reason   metFORMIN (GLUCOPHAGE-XR) 500 MG 24 hr tablet    Meds ordered this encounter  Medications   metFORMIN (GLUCOPHAGE-XR) 500 MG 24 hr tablet    Sig: Daily with lunch    Dispense:  90 tablet    Refill:  3   1. Type 2 diabetes mellitus with other specified complication, without long-term current use of insulin (HCC) Diabetes Mellitus: Not at goal. Medication: metformin XR 500 mg daily. Issues reviewed: blood sugar goals, complications of diabetes mellitus, hypoglycemia prevention and treatment, exercise, and nutrition. FBS 100-110s.  Endorses more sweets cravings at night, but unable to eat all his dinner proteins.  Plan:  Change metformin to taking it at lunch to help with evening sweets cravings.  Nutritional plan counseling done as well. The importance of regular follow up with PCP and all other specialists as scheduled was stressed to patient today. The patient will continue to focus on protein-rich, low simple carbohydrate foods. We reviewed the importance of hydration, regular  exercise for stress reduction, and restorative sleep.   Lab Results  Component Value Date   HGBA1C 6.2 11/23/2020   HGBA1C 5.8 (A) 06/03/2020   HGBA1C 6.8 (H) 04/21/2019   Lab Results  Component Value Date   LDLCALC 44 07/25/2020   CREATININE 1.21 09/08/2020   - Refill metFORMIN (GLUCOPHAGE-XR) 500 MG 24 hr tablet; Daily with lunch  Dispense: 90 tablet; Refill: 3   2. Class 3 severe obesity with serious comorbidity and body mass index (BMI) of 40.0 to 44.9 in adult, unspecified obesity type ([place])  Course: David Brandt is currently in the action stage of change. As such, his goal is to continue with weight loss efforts.   Nutrition goals: He has agreed to the Category 3 Plan with protein equivalents.   Exercise goals:  As is.  Behavioral modification strategies: increasing lean protein intake, decreasing simple carbohydrates, meal planning and cooking strategies, and planning for success.  Meal planning and eating all foods throughout the day (breaking it up into multiple smaller meals).  David Brandt has agreed to follow-up with our clinic in 2-3 weeks with Dr. Jearld Brandt. He was informed of the importance of frequent follow-up visits to maximize his success with intensive lifestyle modifications for his multiple health conditions.    Objective:   Blood pressure (!) 96/56, pulse (!) 55, temperature 98.3 F (36.8 C), height 5\' 8"  (1.727 m), weight 279 lb (126.6 kg), SpO2 95 %. Body mass index is 42.42 kg/m.  General: Cooperative, alert, well  developed, in no acute distress. HEENT: Conjunctivae and lids unremarkable. Cardiovascular: Regular rhythm.  Lungs: Normal work of breathing. Neurologic: No focal deficits.   Lab Results  Component Value Date   CREATININE 1.21 09/08/2020   BUN 20 09/08/2020   NA 133 (L) 09/08/2020   K 4.5 09/08/2020   CL 98 09/08/2020   CO2 28 09/08/2020   Lab Results  Component Value Date   ALT 24 09/08/2020   AST 20 09/08/2020   ALKPHOS 88 09/08/2020    BILITOT 1.0 09/08/2020   Lab Results  Component Value Date   HGBA1C 6.2 11/23/2020   HGBA1C 5.8 (A) 06/03/2020   HGBA1C 6.8 (H) 04/21/2019   HGBA1C 6.0 (A) 03/06/2018   HGBA1C 6.8 (H) 12/06/2017   Lab Results  Component Value Date   INSULIN 22.6 07/25/2020   Lab Results  Component Value Date   TSH 2.050 07/25/2020   Lab Results  Component Value Date   CHOL 115 07/25/2020   HDL 39 (L) 07/25/2020   LDLCALC 44 07/25/2020   TRIG 194 (H) 07/25/2020   CHOLHDL 3.0 04/14/2020   Lab Results  Component Value Date   WBC 10.2 12/05/2020   HGB 15.4 12/05/2020   HCT 46.8 12/05/2020   MCV 91.6 12/05/2020   PLT 209 12/05/2020     Obesity Behavioral Intervention:   Approximately 15 minutes were spent on the discussion below.  ASK: We discussed the diagnosis of obesity with David Brandt today and David Brandt agreed to give [name] permission to discuss obesity behavioral modification therapy today.  ASSESS: David Brandt has the diagnosis of obesity and his BMI today is 42.5. David Brandt is in the action stage of change.   ADVISE: David Brandt was educated on the multiple health risks of obesity as well as the benefit of weight loss to improve his health. He was advised of the need for long term treatment and the importance of lifestyle modifications to improve his current health and to decrease his risk of future health problems.  AGREE: Multiple dietary modification options and treatment options were discussed and David Brandt agreed to follow the recommendations documented in the above note.  ARRANGE: David Brandt was educated on the importance of frequent visits to treat obesity as outlined per CMS and USPSTF guidelines and agreed to schedule his next follow up appointment today.    Attestation Statements:   Reviewed by clinician on day of visit: allergies, medications, problem list, medical history, surgical history, family history, social history, and previous encounter notes.  I, Water quality scientist, CMA, am acting as  Location manager for [employer], DO.  I have reviewed the above documentation for accuracy and completeness, and I agree with the above. Marjory Sneddon, D.O.  The [place] was signed into law in 2016 which includes the topic of electronic health records.  This provides immediate access to information in MyChart.  This includes consultation notes, operative notes, office notes, lab results and pathology reports.  If you have any questions about what you read please let us know at your next visit so we can discuss your concerns and take corrective action if need be.  We are right here with you.

## 2020-12-22 ENCOUNTER — Ambulatory Visit (INDEPENDENT_AMBULATORY_CARE_PROVIDER_SITE_OTHER): Payer: Medicare PPO | Admitting: Family Medicine

## 2020-12-24 ENCOUNTER — Other Ambulatory Visit: Payer: Self-pay | Admitting: Cardiovascular Disease

## 2020-12-27 NOTE — Telephone Encounter (Signed)
New prescription for hydralazine 50 mg 3 times daily.

## 2021-01-02 ENCOUNTER — Inpatient Hospital Stay: Payer: Medicare PPO | Attending: Hematology and Oncology

## 2021-01-02 ENCOUNTER — Telehealth: Payer: Self-pay | Admitting: Hematology and Oncology

## 2021-01-02 ENCOUNTER — Inpatient Hospital Stay: Payer: Medicare PPO

## 2021-01-02 DIAGNOSIS — D751 Secondary polycythemia: Secondary | ICD-10-CM | POA: Insufficient documentation

## 2021-01-02 NOTE — Telephone Encounter (Signed)
Scheduled appointment per 07/11 sch msg. Left message.

## 2021-01-05 ENCOUNTER — Encounter (INDEPENDENT_AMBULATORY_CARE_PROVIDER_SITE_OTHER): Payer: Self-pay

## 2021-01-09 ENCOUNTER — Ambulatory Visit (INDEPENDENT_AMBULATORY_CARE_PROVIDER_SITE_OTHER): Payer: Medicare PPO | Admitting: Family Medicine

## 2021-01-09 ENCOUNTER — Encounter (INDEPENDENT_AMBULATORY_CARE_PROVIDER_SITE_OTHER): Payer: Self-pay

## 2021-01-10 ENCOUNTER — Telehealth: Payer: Self-pay | Admitting: Hematology and Oncology

## 2021-01-10 ENCOUNTER — Inpatient Hospital Stay: Payer: Medicare PPO

## 2021-01-10 NOTE — Telephone Encounter (Signed)
R/s appts per 7/19 sch msg. Called pt, no answer. Left msg with appt date and time.

## 2021-01-16 ENCOUNTER — Other Ambulatory Visit: Payer: Self-pay

## 2021-01-16 ENCOUNTER — Inpatient Hospital Stay: Payer: Medicare PPO

## 2021-01-16 DIAGNOSIS — D751 Secondary polycythemia: Secondary | ICD-10-CM | POA: Diagnosis not present

## 2021-01-16 LAB — CBC WITH DIFFERENTIAL/PLATELET
Abs Immature Granulocytes: 0.03 10*3/uL (ref 0.00–0.07)
Basophils Absolute: 0.1 10*3/uL (ref 0.0–0.1)
Basophils Relative: 1 %
Eosinophils Absolute: 0.3 10*3/uL (ref 0.0–0.5)
Eosinophils Relative: 3 %
HCT: 48 % (ref 39.0–52.0)
Hemoglobin: 15.6 g/dL (ref 13.0–17.0)
Immature Granulocytes: 0 %
Lymphocytes Relative: 15 %
Lymphs Abs: 1.4 10*3/uL (ref 0.7–4.0)
MCH: 30.3 pg (ref 26.0–34.0)
MCHC: 32.5 g/dL (ref 30.0–36.0)
MCV: 93.2 fL (ref 80.0–100.0)
Monocytes Absolute: 1.2 10*3/uL — ABNORMAL HIGH (ref 0.1–1.0)
Monocytes Relative: 12 %
Neutro Abs: 6.8 10*3/uL (ref 1.7–7.7)
Neutrophils Relative %: 69 %
Platelets: 204 10*3/uL (ref 150–400)
RBC: 5.15 MIL/uL (ref 4.22–5.81)
RDW: 13.6 % (ref 11.5–15.5)
WBC: 9.8 10*3/uL (ref 4.0–10.5)
nRBC: 0 % (ref 0.0–0.2)

## 2021-01-16 NOTE — Progress Notes (Signed)
Patient labs resulted back HCT of 48, per tx plan HCT needs to be > 51 to proceed with therapeutic phlebotomy. Patient made aware of labs and next appointment, and given copy. He states understanding.

## 2021-01-17 ENCOUNTER — Ambulatory Visit: Payer: Medicare PPO | Admitting: Family Medicine

## 2021-01-17 ENCOUNTER — Encounter: Payer: Self-pay | Admitting: Family Medicine

## 2021-01-17 VITALS — BP 114/60 | HR 59 | Temp 98.8°F | Ht 68.0 in | Wt 279.4 lb

## 2021-01-17 DIAGNOSIS — E1169 Type 2 diabetes mellitus with other specified complication: Secondary | ICD-10-CM | POA: Diagnosis not present

## 2021-01-17 DIAGNOSIS — D751 Secondary polycythemia: Secondary | ICD-10-CM

## 2021-01-17 DIAGNOSIS — E1140 Type 2 diabetes mellitus with diabetic neuropathy, unspecified: Secondary | ICD-10-CM | POA: Diagnosis not present

## 2021-01-17 DIAGNOSIS — F419 Anxiety disorder, unspecified: Secondary | ICD-10-CM

## 2021-01-17 DIAGNOSIS — J449 Chronic obstructive pulmonary disease, unspecified: Secondary | ICD-10-CM | POA: Diagnosis not present

## 2021-01-17 MED ORDER — ALBUTEROL SULFATE HFA 108 (90 BASE) MCG/ACT IN AERS: 2.0000 | INHALATION_SPRAY | RESPIRATORY_TRACT | 5 refills | Status: DC | PRN

## 2021-01-17 MED ORDER — SERTRALINE HCL 50 MG PO TABS: 50.0000 mg | ORAL_TABLET | Freq: Every day | ORAL | 5 refills | Status: DC

## 2021-01-17 MED ORDER — GABAPENTIN 300 MG PO CAPS: 300.0000 mg | ORAL_CAPSULE | Freq: Three times a day (TID) | ORAL | 5 refills | Status: DC

## 2021-01-17 NOTE — Progress Notes (Signed)
   Subjective:    Patient ID: David Brandt, male    DOB: 08-Sep-1945, 75 y.o.   MRN: [id number]  HPI Here for several issues. First he has developed some anxiety over th epast few months thaty he has never experienced before. This mostly shows up when he is driving. He feels anxious and worries that "something bad is going to happen". He denies any depression symptoms. Also we have been trying Gabapentin 100 mg TID for the neuropathy in his feet, but this has not helped. Lastly he has had some periods of SOB, especially when he is exercising. No chest pain. He had an ECHO and a Myoview stress test in October 2020, both of which were normal. He has known emphysema, and he uses Symbicort BID. He has never had a rescue inhaler however. No coughing. He had a chest CT in April which was remarkable only for the COPD. His Hgb was normal at 15. 6 yesterday. His A1c on 11-23-20 was excellent at 5.8.    Review of Systems  Constitutional: Negative.   Respiratory:  Positive for shortness of breath. Negative for cough, chest tightness and wheezing.   Cardiovascular: Negative.   Psychiatric/Behavioral:  Negative for agitation, behavioral problems, confusion, decreased concentration, dysphoric mood, hallucinations and sleep disturbance. The patient is nervous/anxious.       Objective:   Physical Exam Constitutional:      Appearance: He is obese. He is not ill-appearing.  Cardiovascular:     Rate and Rhythm: Normal rate and regular rhythm.     Pulses: Normal pulses.     Heart sounds: Normal heart sounds.  Pulmonary:     Effort: Pulmonary effort is normal.     Breath sounds: Normal breath sounds.  Lymphadenopathy:     Cervical: No cervical adenopathy.  Neurological:     General: No focal deficit present.     Mental Status: He is alert and oriented to person, place, and time.  Psychiatric:        Mood and Affect: Mood normal.        Behavior: Behavior normal.        Thought Content: Thought content  normal.          Assessment & Plan:  His SOB is likely from his COPD, and he may have a reactive airways component to this. He will try a Ventolin HFA inhaler as needed, and I suggested he use this just before he works out at [employer]. For the anxiety, he will try Zoloft 50 mg daily. For the neuropathy, we will increase the Gabapentin to 300 mg TID. He will report back in 3 weeks. We spent 35 minutes reviewing records and discussing these issues.  Alysia Penna, MD

## 2021-01-23 ENCOUNTER — Ambulatory Visit (INDEPENDENT_AMBULATORY_CARE_PROVIDER_SITE_OTHER): Payer: Medicare PPO | Admitting: Bariatrics

## 2021-01-23 ENCOUNTER — Other Ambulatory Visit: Payer: Self-pay

## 2021-01-23 ENCOUNTER — Encounter (INDEPENDENT_AMBULATORY_CARE_PROVIDER_SITE_OTHER): Payer: Self-pay | Admitting: Bariatrics

## 2021-01-23 ENCOUNTER — Encounter (INDEPENDENT_AMBULATORY_CARE_PROVIDER_SITE_OTHER): Payer: Self-pay

## 2021-01-23 ENCOUNTER — Ambulatory Visit (INDEPENDENT_AMBULATORY_CARE_PROVIDER_SITE_OTHER): Payer: Medicare PPO | Admitting: Family Medicine

## 2021-01-23 VITALS — BP 113/63 | HR 64 | Temp 98.0°F | Ht 68.0 in | Wt 285.0 lb

## 2021-01-23 DIAGNOSIS — Z6841 Body Mass Index (BMI) 40.0 and over, adult: Secondary | ICD-10-CM

## 2021-01-23 DIAGNOSIS — I10 Essential (primary) hypertension: Secondary | ICD-10-CM

## 2021-01-23 DIAGNOSIS — H40003 Preglaucoma, unspecified, bilateral: Secondary | ICD-10-CM | POA: Diagnosis not present

## 2021-01-23 DIAGNOSIS — E1165 Type 2 diabetes mellitus with hyperglycemia: Secondary | ICD-10-CM | POA: Diagnosis not present

## 2021-01-25 ENCOUNTER — Encounter (INDEPENDENT_AMBULATORY_CARE_PROVIDER_SITE_OTHER): Payer: Self-pay | Admitting: Bariatrics

## 2021-01-25 NOTE — Progress Notes (Signed)
Chief Complaint:   OBESITY David Brandt is here to discuss his progress with his obesity treatment plan along with follow-up of his obesity related diagnoses. David Brandt is on the Category 3 Plan and states he is following his eating plan approximately 65-70% of the time. [name] states he is doing cardio and weights for 60 minutes 3-4 times per week.  Today's visit was #: 8 Starting weight: 302 lbs Starting date: 07/25/2020 Today's weight: 285 lbs Today's date: 01/23/2021 Total lbs lost to date: 17 lbs Total lbs lost since last in-office visit: 6 lbs  Interim History: David Brandt is down an additional 6 lbs since his last visit. He states that he wants some sweets after his dinner. He is trying to lose 50 lbs.  Subjective:   1. Type 2 diabetes mellitus with hyperglycemia, without long-term current use of insulin (HCC) David Brandt is taking Metformin.  2. Essential hypertension [place] is taking Coreg, Apresoline, HCTZ and Aldactone, controlled.  Assessment/Plan:   1. Type 2 diabetes mellitus with hyperglycemia, without long-term current use of insulin (HCC) Good blood sugar control is important to decrease the likelihood of diabetic complications such as nephropathy, neuropathy, limb loss, blindness, coronary artery disease, and death. Intensive lifestyle modification including diet, exercise and weight loss are the first line of treatment for diabetes. David Brandt will continue medications.   2. Essential hypertension David Brandt will continue medication. He is working on healthy weight loss and exercise to improve blood pressure control. We will watch for signs of hypotension as he continues his lifestyle modifications.   3. Obesity, current BMI 43.5 David Brandt is currently in the action stage of change. As such, his goal is to continue with weight loss efforts. He has agreed to the Category 3 Plan.   David Brandt will continue meal planning. He will continue intentional eating. He will make good choices for low  calories and low sugar substitute.  Exercise goals:  As is, cardio weights and [place] is doing well.  Behavioral modification strategies: increasing lean protein intake, decreasing simple carbohydrates, increasing vegetables, increasing water intake, decreasing eating out, no skipping meals, meal planning and cooking strategies, keeping healthy foods in the home, and planning for success.  David Brandt has agreed to follow-up with our clinic in 2 weeks with Dr. Jearld Shines. He was informed of the importance of frequent follow-up visits to maximize his success with intensive lifestyle modifications for his multiple health conditions.   Objective:   Blood pressure 113/63, pulse 64, temperature 98 F (36.7 C), height '5\' 8"'$  (1.727 m), weight 285 lb (129.3 kg), SpO2 95 %. Body mass index is 43.33 kg/m.  General: Cooperative, alert, well developed, in no acute distress. HEENT: Conjunctivae and lids unremarkable. Cardiovascular: Regular rhythm.  Lungs: Normal work of breathing. Neurologic: No focal deficits.   Lab Results  Component Value Date   CREATININE 1.21 09/08/2020   BUN 20 09/08/2020   NA 133 (L) 09/08/2020   K 4.5 09/08/2020   CL 98 09/08/2020   CO2 28 09/08/2020   Lab Results  Component Value Date   ALT 24 09/08/2020   AST 20 09/08/2020   ALKPHOS 88 09/08/2020   BILITOT 1.0 09/08/2020   Lab Results  Component Value Date   HGBA1C 6.2 11/23/2020   HGBA1C 5.8 (A) 06/03/2020   HGBA1C 6.8 (H) 04/21/2019   HGBA1C 6.0 (A) 03/06/2018   HGBA1C 6.8 (H) 12/06/2017   Lab Results  Component Value Date   INSULIN 22.6 07/25/2020   Lab Results  Component Value Date  TSH 2.050 07/25/2020   Lab Results  Component Value Date   CHOL 115 07/25/2020   HDL 39 (L) 07/25/2020   LDLCALC 44 07/25/2020   TRIG 194 (H) 07/25/2020   CHOLHDL 3.0 04/14/2020   Lab Results  Component Value Date   VD25OH 39.1 07/25/2020   Lab Results  Component Value Date   WBC 9.8 01/16/2021   HGB 15.6  01/16/2021   HCT 48.0 01/16/2021   MCV 93.2 01/16/2021   PLT 204 01/16/2021   No results found for: IRON, TIBC, FERRITIN  Obesity Behavioral Intervention:   Approximately 15 minutes were spent on the discussion below.  ASK: We discussed the diagnosis of obesity with David Brandt today and David Brandt agreed to give [name] permission to discuss obesity behavioral modification therapy today.  ASSESS: David Brandt has the diagnosis of obesity and his BMI today is 43.5. David Brandt is in the action stage of change.   ADVISE: David Brandt was educated on the multiple health risks of obesity as well as the benefit of weight loss to improve his health. He was advised of the need for long term treatment and the importance of lifestyle modifications to improve his current health and to decrease his risk of future health problems.  AGREE: Multiple dietary modification options and treatment options were discussed and David Brandt agreed to follow the recommendations documented in the above note.  ARRANGE: David Brandt was educated on the importance of frequent visits to treat obesity as outlined per CMS and USPSTF guidelines and agreed to schedule his next follow up appointment today.  Attestation Statements:   Reviewed by clinician on day of visit: allergies, medications, problem list, medical history, surgical history, family history, social history, and previous encounter notes.  I, David Brandt, RMA, am acting as Location manager for [employer], DO.   I have reviewed the above documentation for accuracy and completeness, and I agree with the above. David Lesch, DO

## 2021-02-06 ENCOUNTER — Inpatient Hospital Stay: Payer: Medicare PPO

## 2021-02-06 ENCOUNTER — Inpatient Hospital Stay: Payer: Medicare PPO | Attending: Hematology and Oncology | Admitting: Hematology and Oncology

## 2021-02-06 ENCOUNTER — Encounter: Payer: Self-pay | Admitting: Hematology and Oncology

## 2021-02-06 ENCOUNTER — Other Ambulatory Visit: Payer: Self-pay

## 2021-02-06 VITALS — BP 117/55 | HR 48 | Temp 98.0°F | Resp 17 | Wt 288.0 lb

## 2021-02-06 DIAGNOSIS — D751 Secondary polycythemia: Secondary | ICD-10-CM | POA: Insufficient documentation

## 2021-02-06 DIAGNOSIS — Z87891 Personal history of nicotine dependence: Secondary | ICD-10-CM | POA: Diagnosis not present

## 2021-02-06 DIAGNOSIS — G4733 Obstructive sleep apnea (adult) (pediatric): Secondary | ICD-10-CM | POA: Insufficient documentation

## 2021-02-06 DIAGNOSIS — E291 Testicular hypofunction: Secondary | ICD-10-CM | POA: Diagnosis not present

## 2021-02-06 DIAGNOSIS — R001 Bradycardia, unspecified: Secondary | ICD-10-CM | POA: Diagnosis not present

## 2021-02-06 LAB — CBC WITH DIFFERENTIAL/PLATELET
Abs Immature Granulocytes: 0.04 10*3/uL (ref 0.00–0.07)
Basophils Absolute: 0.1 10*3/uL (ref 0.0–0.1)
Basophils Relative: 1 %
Eosinophils Absolute: 0.4 10*3/uL (ref 0.0–0.5)
Eosinophils Relative: 4 %
HCT: 49.1 % (ref 39.0–52.0)
Hemoglobin: 15.9 g/dL (ref 13.0–17.0)
Immature Granulocytes: 0 %
Lymphocytes Relative: 16 %
Lymphs Abs: 1.8 10*3/uL (ref 0.7–4.0)
MCH: 30.6 pg (ref 26.0–34.0)
MCHC: 32.4 g/dL (ref 30.0–36.0)
MCV: 94.4 fL (ref 80.0–100.0)
Monocytes Absolute: 1.2 10*3/uL — ABNORMAL HIGH (ref 0.1–1.0)
Monocytes Relative: 11 %
Neutro Abs: 7.3 10*3/uL (ref 1.7–7.7)
Neutrophils Relative %: 68 %
Platelets: 194 10*3/uL (ref 150–400)
RBC: 5.2 MIL/uL (ref 4.22–5.81)
RDW: 13.5 % (ref 11.5–15.5)
WBC: 10.8 10*3/uL — ABNORMAL HIGH (ref 4.0–10.5)
nRBC: 0 % (ref 0.0–0.2)

## 2021-02-06 NOTE — Progress Notes (Signed)
[facility] CONSULT NOTE  Patient Care Team: [name], MD as PCP - General (Family Medicine) Skeet Latch, MD as PCP - Cardiology (Cardiology) Clark-Burning, Anderson Malta, PA-C (Inactive) (Dermatology)  CHIEF COMPLAINTS/PURPOSE OF CONSULTATION:  Polycythemia, secondary, follow up  ASSESSMENT & PLAN:   #1.  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  Since he stopped testosterone, his polycythemia is well cotnrolled. He however was hoping to try testosterone again, since he has no erections without it. He can restart a lower dose if desired but has to be monitored monthly with labs and as needed phlebotomy if he chooses to restart it. At this time, since he is not needing phlebotomies regularly, he will continue follow up every quarter with as needed phlebotomies.  If he wishes to donate blood, he can skip some therapeutic phlebotomies. No concerning review of systems findings or physical examination findings today.  He is agreeable to the above plan.  He was informed to keep [name] posted if he restarts his testosterone.  2. OSA on CPAP, patient diligent about using it every day.  3.  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  I have advised him to cut down the carvedilol dose to 25 mg once in the morning and 12.5 mg once in the evening and follow-up with his primary care physician for further recommendations.  He is completely asymptomatic from the bradycardia at this time.   HISTORY OF PRESENTING ILLNESS:  David Brandt 75 y.o. male is here because of polycythemia.  This is a very pleasant 75 year old male patient with past medical history significant for diabetes, obesity, hypertension, dyslipidemia, obstructive sleep apnea who is referred to  hematology for evaluation of secondary polycythemia likely from testosterone supplementation.  Interval history  David Brandt is here for follow-up by himself.   Since last visit, no complaints He was hoping to restart some testosterone back since he has no erections without it. Otherwise diligent with CPAP Energy levels are good, meeting with diet doctor to keep his weight under control No change in bowel habits or urinary habits. No new neurological complaints. Rest of the pertinent 10 point ROS reviewed and neg.  MEDICAL HISTORY:  Past Medical History:  Diagnosis Date   Abnormal cardiovascular stress test 03/29/2016   Allergic rhinitis    Allergy    Arthritis    Asthma    Back pain    Diabetes ([place]) 03/29/2016   Gout    History of kidney stones    Hyperlipidemia    Hypertension    Joint pain    Multiple food allergies    Prediabetes    Sleep apnea    cpap    SURGICAL HISTORY: Past Surgical History:  Procedure Laterality Date   CARDIAC CATHETERIZATION N/A 03/29/2016   Procedure: Right/Left Heart Cath and Coronary Angiography;  Surgeon: Nelva Bush, MD;  Location: [facility];  Service: Cardiovascular;  Laterality: N/A;   CARDIAC CATHETERIZATION N/A 03/29/2016   Procedure: Intravascular Pressure Wire/FFR Study;  Surgeon: Nelva Bush, MD;  Location: [facility];  Service: Cardiovascular;  Laterality: N/A;   CARDIAC CATHETERIZATION Left 04/02/2016   Procedure: FEMORAL ARTERIAL LINE INSERTION;  Surgeon: [name], MD;  Location: [facility];  Service: Open Heart Surgery;  Laterality: Left;   COLONOSCOPY  02/20/2018   per Dr. 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/2016   Procedure: CORONARY ARTERY BYPASS GRAFTING (CABG) X 5 UTILIZING LEFT INTERNAL MAMMARY ARTERY AND ENDOSCOPICALLY HARVESTED GREATER SAPHENOUS VEIN ; Mammary to LAD, Sequencial 1st to 2nd Diagonal, Vein to OM, Vein to Distal  RCA.;  Surgeon: [name], MD;  Location: [facility];  Service: Open Heart Surgery;  Laterality: N/A;   CYSTOSCOPY/URETEROSCOPY/HOLMIUM LASER/STENT PLACEMENT Bilateral 05/07/2019   Procedure: LEFT URETEROSCOPY/HOLMIUM LASER/STENT PLACEMENT/BIOPSY LEFT MID URETER/RIGHT RETROGRADE STENT PLACEMENT ;  Surgeon: [name], MD;  Location: [facility];  Service: Urology;  Laterality: Bilateral;   CYSTOSCOPY/URETEROSCOPY/HOLMIUM LASER/STENT PLACEMENT Bilateral 05/27/2019   Procedure: CYSTOSCOPY, URETEROSCOPY/ RETROGRADE PYELOGRAM/ HOLMIUM LASER/STENT EXCHANGE;  Surgeon: [name], MD;  Location: [facility];  Service: Urology;  Laterality: Bilateral;   LYMPH NODE BIOPSY Left 04/02/2016   Procedure: INCIDENTAL LEFT MAMMARY LYMPH NODE BIOPSY;  Surgeon: [name], MD;  Location: [facility];  Service: Open Heart Surgery;  Laterality: Left;   mass abdomen  1955   NEPHROLITHOTOMY Left 04/24/2019   Procedure: NEPHROLITHOTOMY PERCUTANEOUS WITH SURGEON ACCESS;  Surgeon: [name], MD;  Location: [facility];  Service: Urology;  Laterality: Left;   TEE WITHOUT CARDIOVERSION N/A 04/02/2016   Procedure: TRANSESOPHAGEAL ECHOCARDIOGRAM (TEE);  Surgeon: [name], MD;  Location: [facility];  Service: Open Heart Surgery;  Laterality: N/A;    SOCIAL HISTORY: Social History   Socioeconomic History   Marital status: Married    Spouse name: Hassan Rowan   Number of children: Y   Years of education: Not on file   Highest education level: Not on file  Occupational History   Not on file  Tobacco Use   Smoking status: Former    Packs/day: 1.00    Years: 47.00    Pack years: 47.00    Types: Cigarettes    Quit date: 06/25/2018    Years since quitting: 2.6   Smokeless tobacco: Never  Vaping Use   Vaping Use: Never used  Substance and Sexual Activity   Alcohol use: Yes    Alcohol/week: 4.0 standard drinks    Types: 4 Cans of beer per week   Drug use: Yes    Frequency: 7.0 times per week    Types:  Marijuana    Comment: marijuana-states still smoking 10/19/2020   Sexual activity: Not on file  Other Topics Concern   Not on file  Social History Narrative   Not on file   Social Determinants of Health   Financial Resource Strain: Low Risk    Difficulty of Paying Living Expenses: Not hard at all  Food Insecurity: No Food Insecurity   Worried About Charity fundraiser in the Last Year: Never true   Ran Out of Food in the Last Year: Never true  Transportation Needs: No Transportation Needs   Lack of Transportation (Medical): No   Lack of Transportation (Non-Medical): No  Physical Activity: Insufficiently Active   Days of Exercise per Week: 2 days   Minutes of Exercise per Session: 60 min  Stress: No Stress Concern Present   Feeling of Stress : Not at all  Social Connections: Moderately Integrated   Frequency of Communication with Friends and Family: More than three times a week   Frequency of Social Gatherings with Friends and Family: More than three times a week   Attends Religious Services: 1 to 4 times per year   Active Member of Clubs or Organizations: No   Attends  Archivist Meetings: Never   Marital Status: Married  Human resources officer Violence: Not At Risk   Fear of Current or Ex-Partner: No   Emotionally Abused: No   Physically Abused: No   Sexually Abused: No    FAMILY HISTORY: Family History  Problem Relation Age of Onset   Colon cancer Father 26   Cancer Father    Alcohol abuse Father    Stroke Mother    Diabetes Mother    Hypertension Mother    Hyperlipidemia Mother    Obesity Mother    Heart disease Maternal Aunt    Diabetes Maternal Uncle    Stomach cancer Neg Hx    Esophageal cancer Neg Hx    Rectal cancer Neg Hx     ALLERGIES:  is allergic to allopurinol, justicia adhatoda (malabar nut tree) [justicia adhatoda], valsartan-hydrochlorothiazide, and sulfa antibiotics.  MEDICATIONS:  Current Outpatient Medications  Medication Sig Dispense  Refill   albuterol (VENTOLIN HFA) 108 (90 Base) MCG/ACT inhaler Inhale 2 puffs into the lungs every 4 (four) hours as needed for wheezing or shortness of breath. 8 g 5   Alcohol Swabs (B-D SINGLE USE SWABS REGULAR) PADS Use as directed. 100 each 3   Apoaequorin (PREVAGEN PO) Take 20 mg by mouth.     aspirin EC 81 MG tablet Take 81 mg by mouth daily.     atorvastatin (LIPITOR) 80 MG tablet TAKE 1 TABLET (80 MG TOTAL) BY MOUTH DAILY AT 6 PM. 90 tablet 1   carvedilol (COREG) 25 MG tablet TAKE 1 TABLET TWICE DAILY (PLEASE SCHEDULE AN APPT FOR FUTURE REFILLS) 180 tablet 2   celecoxib (CELEBREX) 200 MG capsule      Cholecalciferol (VITAMIN D3) 25 MCG (1000 UT) CAPS Take 2,000 Units by mouth daily.     felodipine (PLENDIL) 10 MG 24 hr tablet TAKE 1 TABLET BY MOUTH EVERY DAY 90 tablet 3   gabapentin (NEURONTIN) 300 MG capsule Take 1 capsule (300 mg total) by mouth 3 (three) times daily. 90 capsule 5   hydrALAZINE (APRESOLINE) 50 MG tablet TAKE 1 TABLET BY MOUTH THREE TIMES A DAY 270 tablet 1   hydrochlorothiazide (HYDRODIURIL) 25 MG tablet TAKE 1 TABLET EVERY DAY (NEED MD APPOINTMENT FOR REFILLS) 90 tablet 2   metFORMIN (GLUCOPHAGE-XR) 500 MG 24 hr tablet Daily with lunch 90 tablet 3   OVER THE COUNTER MEDICATION Take 2 capsules by mouth 2 (two) times daily. OmegaXL     POTASSIUM CITRATE PO Take 15 mEq by mouth.     sertraline (ZOLOFT) 50 MG tablet Take 1 tablet (50 mg total) by mouth daily. 30 tablet 5   spironolactone (ALDACTONE) 25 MG tablet TAKE 1 TABLET EVERY DAY 90 tablet 0   SYMBICORT 160-4.5 MCG/ACT inhaler TAKE 2 PUFFS BY MOUTH TWICE A DAY 30.6 Inhaler 3   No current facility-administered medications for this visit.     PHYSICAL EXAMINATION: ECOG PERFORMANCE STATUS: 0 - Asymptomatic  Vitals:   02/06/21 0858  BP: (!) 117/55  Pulse: (!) 48  Resp: 17  Temp: 98 F (36.7 C)  SpO2: 92%   Filed Weights   02/06/21 0858  Weight: 288 lb (130.6 kg)   Physical Exam Constitutional:       Appearance: Normal appearance. He is obese.  HENT:     Head: Normocephalic and atraumatic.  Cardiovascular:     Rate and Rhythm: Normal rate and regular rhythm.     Pulses: Normal pulses.     Heart sounds: Normal heart  sounds.  Pulmonary:     Effort: Pulmonary effort is normal.     Breath sounds: Normal breath sounds.  Abdominal:     General: Abdomen is flat.     Palpations: Abdomen is soft. There is no mass.  Musculoskeletal:        General: Normal range of motion.     Cervical back: Normal range of motion and neck supple. No rigidity.  Lymphadenopathy:     Cervical: No cervical adenopathy.  Skin:    General: Skin is warm and dry.  Neurological:     General: No focal deficit present.     Mental Status: He is alert.  Psychiatric:        Mood and Affect: Mood normal.        Behavior: Behavior normal.    LABORATORY DATA:  I have reviewed the data as listed Lab Results  Component Value Date   WBC 10.8 (H) 02/06/2021   HGB 15.9 02/06/2021   HCT 49.1 02/06/2021   MCV 94.4 02/06/2021   PLT 194 02/06/2021     Chemistry      Component Value Date/Time   NA 133 (L) 09/08/2020 1127   NA 140 07/25/2020 1134   K 4.5 09/08/2020 1127   CL 98 09/08/2020 1127   CO2 28 09/08/2020 1127   BUN 20 09/08/2020 1127   BUN 11 07/25/2020 1134   CREATININE 1.21 09/08/2020 1127   CREATININE 1.03 05/02/2016 0946      Component Value Date/Time   CALCIUM 9.6 09/08/2020 1127   ALKPHOS 88 09/08/2020 1127   AST 20 09/08/2020 1127   ALT 24 09/08/2020 1127   BILITOT 1.0 09/08/2020 1127     I reviewed his labs.   CBC with Hb of 15.9, WBC of 10.8 and platelet count of 194.  RADIOGRAPHIC STUDIES: I have personally reviewed the radiological images as listed and agreed with the findings in the report. No results found.  I have spent 30 minutes in the care of this patient including history and physical, review of records, discussion about therapeutic phlebotomy versus blood donation,  testosterone supplementation and secondary polycythemia as well as medication management for sinus bradycardia.   Benay Pike, MD 02/06/2021 9:05 AM

## 2021-02-07 ENCOUNTER — Encounter (INDEPENDENT_AMBULATORY_CARE_PROVIDER_SITE_OTHER): Payer: Self-pay | Admitting: Bariatrics

## 2021-02-07 ENCOUNTER — Other Ambulatory Visit: Payer: Self-pay

## 2021-02-07 ENCOUNTER — Ambulatory Visit (INDEPENDENT_AMBULATORY_CARE_PROVIDER_SITE_OTHER): Payer: Medicare PPO | Admitting: Bariatrics

## 2021-02-07 VITALS — BP 136/78 | HR 54 | Temp 97.6°F | Ht 68.0 in | Wt 284.0 lb

## 2021-02-07 DIAGNOSIS — E1165 Type 2 diabetes mellitus with hyperglycemia: Secondary | ICD-10-CM | POA: Diagnosis not present

## 2021-02-07 DIAGNOSIS — I10 Essential (primary) hypertension: Secondary | ICD-10-CM

## 2021-02-07 DIAGNOSIS — Z6841 Body Mass Index (BMI) 40.0 and over, adult: Secondary | ICD-10-CM

## 2021-02-08 ENCOUNTER — Other Ambulatory Visit: Payer: Self-pay | Admitting: Family Medicine

## 2021-02-08 NOTE — Progress Notes (Signed)
Chief Complaint:   OBESITY David Brandt is here to discuss his progress with his obesity treatment plan along with follow-up of his obesity related diagnoses. David Brandt is on the Category 3 Plan and states he is following his eating plan approximately 50% of the time. David Brandt states he is cardio and strength for 60 minutes 3 times per week.  Today's visit was #: 9 Starting weight: 302 lbs Starting date: 07/25/2020 Today's weight: 284 lbs Today's date:02/07/2021 Total lbs lost to date: 18 lbs Total lbs lost since last in-office visit: 1 lb  Interim History: David Brandt is down 1 additional lb since his last visit. He has been eating too many snacks.  Subjective:   1. Type 2 diabetes mellitus with hyperglycemia, without long-term current use of insulin (HCC) David Brandt is currently taking Metformin. His diabetes is well controlled. He did medicine reconcilation.  2. Essential hypertension Review: taking medications as instructed, no medication side effects noted, no chest pain on exertion, no dyspnea on exertion, no swelling of ankles. David Brandt's hypertension is controlled.  Assessment/Plan:   1. Type 2 diabetes mellitus with hyperglycemia, without long-term current use of insulin (HCC) David Brandt will decrease carbohydrates and increase healthy fats and protein. He will continue his medications.Good blood sugar control is important to decrease the likelihood of diabetic complications such as nephropathy, neuropathy, limb loss, blindness, coronary artery disease, and death. Intensive lifestyle modification including diet, exercise and weight loss are the first line of treatment for diabetes.   2. Essential hypertension David Brandt will continue his medication. He is working on healthy weight loss and exercise to improve blood pressure control. We will watch for signs of hypotension as he continues his lifestyle modifications.  3. Obesity, current BMI 43.3 David Brandt is currently in the action stage of change. As  such, his goal is to continue with weight loss efforts. He has agreed to the Category 3 Plan.   David Brandt will continue meal planning. He will continue intentional eating. He will increase H2O. He will "stop snacking bad stuff like chips".  Exercise goals:  As is.  Behavioral modification strategies: increasing lean protein intake, decreasing simple carbohydrates, increasing vegetables, increasing water intake, decreasing eating out, no skipping meals, meal planning and cooking strategies, keeping healthy foods in the home, and planning for success.  David Brandt has agreed to follow-up with our clinic in 2-3 weeks with Dr.Opalski or Dr.Ukleja. He was informed of the importance of frequent follow-up visits to maximize his success with intensive lifestyle modifications for his multiple health conditions.   Objective:   Blood pressure 136/78, pulse (!) 54, temperature 97.6 F (36.4 C), height '5\' 8"'$  (1.727 m), weight 284 lb (128.8 kg), SpO2 95 %. Body mass index is 43.18 kg/m.  General: Cooperative, alert, well developed, in no acute distress. HEENT: Conjunctivae and lids unremarkable. Cardiovascular: Regular rhythm.  Lungs: Normal work of breathing. Neurologic: No focal deficits.   Lab Results  Component Value Date   CREATININE 1.21 09/08/2020   BUN 20 09/08/2020   NA 133 (L) 09/08/2020   K 4.5 09/08/2020   CL 98 09/08/2020   CO2 28 09/08/2020   Lab Results  Component Value Date   ALT 24 09/08/2020   AST 20 09/08/2020   ALKPHOS 88 09/08/2020   BILITOT 1.0 09/08/2020   Lab Results  Component Value Date   HGBA1C 6.2 11/23/2020   HGBA1C 5.8 (A) 06/03/2020   HGBA1C 6.8 (H) 04/21/2019   HGBA1C 6.0 (A) 03/06/2018   HGBA1C 6.8 (H) 12/06/2017  Lab Results  Component Value Date   INSULIN 22.6 07/25/2020   Lab Results  Component Value Date   TSH 2.050 07/25/2020   Lab Results  Component Value Date   CHOL 115 07/25/2020   HDL 39 (L) 07/25/2020   LDLCALC 44 07/25/2020   TRIG 194  (H) 07/25/2020   CHOLHDL 3.0 04/14/2020   Lab Results  Component Value Date   VD25OH 39.1 07/25/2020   Lab Results  Component Value Date   WBC 10.8 (H) 02/06/2021   HGB 15.9 02/06/2021   HCT 49.1 02/06/2021   MCV 94.4 02/06/2021   PLT 194 02/06/2021   No results found for: IRON, TIBC, FERRITIN  Obesity Behavioral Intervention:   Approximately 15 minutes were spent on the discussion below.  ASK: We discussed the diagnosis of obesity with David Brandt today and David Brandt agreed to give [name] permission to discuss obesity behavioral modification therapy today.  ASSESS: David Brandt has the diagnosis of obesity and his BMI today is 43.3. David Brandt is in the action stage of change.   ADVISE: David Brandt was educated on the multiple health risks of obesity as well as the benefit of weight loss to improve his health. He was advised of the need for long term treatment and the importance of lifestyle modifications to improve his current health and to decrease his risk of future health problems.  AGREE: Multiple dietary modification options and treatment options were discussed and David Brandt agreed to follow the recommendations documented in the above note.  ARRANGE: David Brandt was educated on the importance of frequent visits to treat obesity as outlined per CMS and USPSTF guidelines and agreed to schedule his next follow up appointment today.  Attestation Statements:   Reviewed by clinician on day of visit: allergies, medications, problem list, medical history, surgical history, family history, social history, and previous encounter notes.  I, [name], RMA, am acting as Location manager for [employer], DO.   I have reviewed the above documentation for accuracy and completeness, and I agree with the above. Jearld Lesch, DO

## 2021-02-10 ENCOUNTER — Encounter (INDEPENDENT_AMBULATORY_CARE_PROVIDER_SITE_OTHER): Payer: Self-pay | Admitting: Bariatrics

## 2021-02-10 NOTE — Telephone Encounter (Signed)
The patient would like to speak with the doctor about his testosterone medication  [phone]

## 2021-02-13 ENCOUNTER — Telehealth: Payer: Self-pay

## 2021-02-13 NOTE — Telephone Encounter (Signed)
What are his questions exactly?

## 2021-02-13 NOTE — Telephone Encounter (Signed)
Patient called regarding testosterone prescription not found on medication list patient would like a call back to know if he should be taking it

## 2021-02-15 DIAGNOSIS — M48062 Spinal stenosis, lumbar region with neurogenic claudication: Secondary | ICD-10-CM | POA: Diagnosis not present

## 2021-02-15 DIAGNOSIS — M5136 Other intervertebral disc degeneration, lumbar region: Secondary | ICD-10-CM | POA: Diagnosis not present

## 2021-02-15 DIAGNOSIS — M4726 Other spondylosis with radiculopathy, lumbar region: Secondary | ICD-10-CM | POA: Diagnosis not present

## 2021-02-15 NOTE — Telephone Encounter (Signed)
Pt wants to restart back a low dose Testosterone injection, pt last Testosterone lab was done on 08/08/2020. Pt last dose was in 3/22. Please avdise

## 2021-02-15 NOTE — Telephone Encounter (Signed)
Patient came in to the office requesting an update about the medication and hasnt heard anything. Also dropped off a form that needs to be signed, have placed sheet in basket up front.

## 2021-02-16 MED ORDER — TESTOSTERONE CYPIONATE 200 MG/ML IM SOLN: 200.0000 mg | INTRAMUSCULAR | 0 refills | Status: DC

## 2021-02-16 NOTE — Telephone Encounter (Signed)
Spoke with pt this morning aware that Rx for Testosterone was filled today, Awaiting for pt Surgical Clearance Form. Form not in the Dr Dierdre Highman as documented

## 2021-02-16 NOTE — Telephone Encounter (Signed)
Yes he should still be taking it. I don't know why it dropped off his medication list, but I just put it back on it

## 2021-02-16 NOTE — Telephone Encounter (Signed)
As I said in my answer to his other request, I added the testosterone back to his medication list. He still has refills available. I will look out for the form

## 2021-02-17 NOTE — Telephone Encounter (Signed)
Spoke with pt advised to have the Surgical Clearance form faxed to our office, verbalized understanding

## 2021-02-21 NOTE — Telephone Encounter (Signed)
Surgical clearance form received and placed on Dr [name] folder for completing

## 2021-02-22 NOTE — Telephone Encounter (Signed)
Pt surgical clearance forms have been completed and faxed to [facility] office, confirmation received and the copy sent to scanning

## 2021-03-06 ENCOUNTER — Ambulatory Visit (INDEPENDENT_AMBULATORY_CARE_PROVIDER_SITE_OTHER): Payer: Medicare PPO | Admitting: Family Medicine

## 2021-03-06 ENCOUNTER — Encounter (INDEPENDENT_AMBULATORY_CARE_PROVIDER_SITE_OTHER): Payer: Self-pay | Admitting: Family Medicine

## 2021-03-06 ENCOUNTER — Other Ambulatory Visit: Payer: Self-pay

## 2021-03-06 VITALS — BP 109/61 | HR 51 | Temp 97.7°F | Ht 68.0 in | Wt 285.0 lb

## 2021-03-06 DIAGNOSIS — Z6841 Body Mass Index (BMI) 40.0 and over, adult: Secondary | ICD-10-CM

## 2021-03-06 DIAGNOSIS — E1169 Type 2 diabetes mellitus with other specified complication: Secondary | ICD-10-CM | POA: Diagnosis not present

## 2021-03-06 DIAGNOSIS — E1140 Type 2 diabetes mellitus with diabetic neuropathy, unspecified: Secondary | ICD-10-CM | POA: Diagnosis not present

## 2021-03-06 MED ORDER — METFORMIN HCL ER 500 MG PO TB24: ORAL_TABLET | ORAL | 0 refills | Status: DC

## 2021-03-06 NOTE — Progress Notes (Signed)
Chief Complaint:   OBESITY David Brandt is here to discuss his progress with his obesity treatment plan along with follow-up of his obesity related diagnoses. David Brandt is on the Category 3 Plan and states he is following his eating plan approximately 90% of the time. David Brandt states he is going to the gym 45-60 minutes 3 times per week.  Today's visit was #: 10 Starting weight: 302 lbs Starting date: 07/25/2020 Today's weight: 285 lbs Today's date: 03/06/2021 Total lbs lost to date: 17 Total lbs lost since last in-office visit: 0  Interim History: David Brandt has been doing fairly well and has been trying to follow plan as strictly as he can. Pt has been consistently getting phlebotomized with no issues. His nephew is getting married at pt's house in 4 days, so he has been busy. He is still experiencing cravings for sweets.  Subjective:   1. Type 2 diabetes mellitus with other specified complication, without long-term current use of insulin (David Brandt) David Brandt's last A1c was 6.2. He has some neuropathy symptoms in his feet.  2. Neuropathy due to type 2 diabetes mellitus (David Brandt) Pt is on gabapentin 300 mg daily. He is still experiencing some symptoms.  Assessment/Plan:   1. Type 2 diabetes mellitus with other specified complication, without long-term current use of insulin (David Brandt) Good blood sugar control is important to decrease the likelihood of diabetic complications such as nephropathy, neuropathy, limb loss, blindness, coronary artery disease, and death. Intensive lifestyle modification including diet, exercise and weight loss are the first line of treatment for diabetes.   Refill- metFORMIN (GLUCOPHAGE-XR) 500 MG 24 hr tablet; Daily with lunch  Dispense: 90 tablet; Refill: 0  2. Neuropathy due to type 2 diabetes mellitus (David Brandt) Pt is to reach out to podiatry for further management.  3. Obesity, current BMI of 43.5  David Brandt is currently in the action stage of change. As such, his goal is to continue  with weight loss efforts. He has agreed to the Category 3 Plan.   Exercise goals: All adults should avoid inactivity. Some physical activity is better than none, and adults who participate in any amount of physical activity gain some health benefits.  Behavioral modification strategies: increasing lean protein intake, meal planning and cooking strategies, and keeping healthy foods in the home.  David Brandt has agreed to follow-up with our clinic in 2-3 weeks. He was informed of the importance of frequent follow-up visits to maximize his success with intensive lifestyle modifications for his multiple health conditions.   Objective:   Blood pressure 109/61, pulse (!) 51, temperature 97.7 F (36.5 C), height '5\' 8"'$  (1.727 m), weight 285 lb (129.3 kg), SpO2 99 %. Body mass index is 43.33 kg/m.  General: Cooperative, alert, well developed, in no acute distress. HEENT: Conjunctivae and lids unremarkable. Cardiovascular: Regular rhythm.  Lungs: Normal work of breathing. Neurologic: No focal deficits.   Lab Results  Component Value Date   CREATININE 1.21 09/08/2020   BUN 20 09/08/2020   NA 133 (L) 09/08/2020   K 4.5 09/08/2020   CL 98 09/08/2020   CO2 28 09/08/2020   Lab Results  Component Value Date   ALT 24 09/08/2020   AST 20 09/08/2020   ALKPHOS 88 09/08/2020   BILITOT 1.0 09/08/2020   Lab Results  Component Value Date   HGBA1C 6.2 11/23/2020   HGBA1C 5.8 (A) 06/03/2020   HGBA1C 6.8 (H) 04/21/2019   HGBA1C 6.0 (A) 03/06/2018   HGBA1C 6.8 (H) 12/06/2017   Lab Results  Component Value Date   INSULIN 22.6 07/25/2020   Lab Results  Component Value Date   TSH 2.050 07/25/2020   Lab Results  Component Value Date   CHOL 115 07/25/2020   HDL 39 (L) 07/25/2020   LDLCALC 44 07/25/2020   TRIG 194 (H) 07/25/2020   CHOLHDL 3.0 04/14/2020   Lab Results  Component Value Date   VD25OH 39.1 07/25/2020   Lab Results  Component Value Date   WBC 10.8 (H) 02/06/2021   HGB 15.9  02/06/2021   HCT 49.1 02/06/2021   MCV 94.4 02/06/2021   PLT 194 02/06/2021   No results found for: IRON, TIBC, FERRITIN  Obesity Behavioral Intervention:   Approximately 15 minutes were spent on the discussion below.  ASK: We discussed the diagnosis of obesity with David Brandt today and David Brandt agreed to give [name] permission to discuss obesity behavioral modification therapy today.  ASSESS: David Brandt has the diagnosis of obesity and his BMI today is 43.5. David Brandt is in the action stage of change.   ADVISE: David Brandt was educated on the multiple health risks of obesity as well as the benefit of weight loss to improve his health. He was advised of the need for long term treatment and the importance of lifestyle modifications to improve his current health and to decrease his risk of future health problems.  AGREE: Multiple dietary modification options and treatment options were discussed and David Brandt agreed to follow the recommendations documented in the above note.  ARRANGE: David Brandt was educated on the importance of frequent visits to treat obesity as outlined per CMS and USPSTF guidelines and agreed to schedule his next follow up appointment today.  Attestation Statements:   Reviewed by clinician on day of visit: allergies, medications, problem list, medical history, surgical history, family history, social history, and previous encounter notes.  Coral Ceo, CMA, am acting as transcriptionist for Coralie Common, MD.   I have reviewed the above documentation for accuracy and completeness, and I agree with the above. - Coralie Common, MD

## 2021-03-20 ENCOUNTER — Ambulatory Visit (INDEPENDENT_AMBULATORY_CARE_PROVIDER_SITE_OTHER): Payer: Medicare PPO | Admitting: Family Medicine

## 2021-03-20 ENCOUNTER — Other Ambulatory Visit: Payer: Self-pay

## 2021-03-20 ENCOUNTER — Encounter (INDEPENDENT_AMBULATORY_CARE_PROVIDER_SITE_OTHER): Payer: Self-pay | Admitting: Family Medicine

## 2021-03-20 VITALS — BP 139/71 | HR 57 | Temp 97.9°F | Ht 68.0 in | Wt 288.0 lb

## 2021-03-20 DIAGNOSIS — Z6841 Body Mass Index (BMI) 40.0 and over, adult: Secondary | ICD-10-CM | POA: Diagnosis not present

## 2021-03-20 DIAGNOSIS — I152 Hypertension secondary to endocrine disorders: Secondary | ICD-10-CM

## 2021-03-20 DIAGNOSIS — E1159 Type 2 diabetes mellitus with other circulatory complications: Secondary | ICD-10-CM

## 2021-03-20 DIAGNOSIS — E1165 Type 2 diabetes mellitus with hyperglycemia: Secondary | ICD-10-CM | POA: Diagnosis not present

## 2021-03-20 NOTE — Progress Notes (Signed)
Chief Complaint:   OBESITY David Brandt is here to discuss his progress with his obesity treatment plan along with follow-up of his obesity related diagnoses. Gilman is on the Category 3 Plan and states he is following his eating plan approximately 75-80% of the time. Randon states he is walking 30 minutes 3-4 times per week.  Today's visit was #: 11 Starting weight: 302 lbs Starting date: 07/25/2020 Today's weight: 288 lbs Today's date: 03/20/2021 Total lbs lost to date: 14 Total lbs lost since last in-office visit: 0  Interim History: David Brandt has been trying to stick strictly to his diet and sometimes is eating more than what is allotted. He wants to really recommit , so he can get back surgery. His wife is being evaluated for a kidney transplant.  Subjective:   1. Hypertension associated with diabetes ([place]) BP well controlled today. Pt denies chest pain/chest pressure/headache. He is taking Coreg, Plendil, hydralazine, HCTZ, and aldactone.  2. Type 2 diabetes mellitus with hyperglycemia, without long-term current use of insulin ([facility]) David Brandt is on Metformin, and his last A1c was 6.2.  Assessment/Plan:   1. Hypertension associated with diabetes ([place]) Tyquez is working on healthy weight loss and exercise to improve blood pressure control. We will watch for signs of hypotension as he continues his lifestyle modifications. Continue current treatment plan. Follow up BP at next appt.   2. Type 2 diabetes mellitus with hyperglycemia, without long-term current use of insulin ([facility]) Good blood sugar control is important to decrease the likelihood of diabetic complications such as nephropathy, neuropathy, limb loss, blindness, coronary artery disease, and death. Intensive lifestyle modification including diet, exercise and weight loss are the first line of treatment for diabetes. Continue current treatment plan. Repeat labs in January 2023.  3. Obesity, current BMI of 43.9  David Brandt is currently  in the action stage of change. As such, his goal is to continue with weight loss efforts. He has agreed to the Category 3 Plan.   Exercise goals: All adults should avoid inactivity. Some physical activity is better than none, and adults who participate in any amount of physical activity gain some health benefits.  Behavioral modification strategies: increasing lean protein intake, decreasing simple carbohydrates, meal planning and cooking strategies, and keeping healthy foods in the home.  David Brandt has agreed to follow-up with our clinic in 2-3 weeks. He was informed of the importance of frequent follow-up visits to maximize his success with intensive lifestyle modifications for his multiple health conditions.   Objective:   Blood pressure 139/71, pulse (!) 57, temperature 97.9 F (36.6 C), height 5\' 8"  (1.727 m), weight 283 lb (128.4 kg), SpO2 97 %. Body mass index is 43.03 kg/m.  General: Cooperative, alert, well developed, in no acute distress. HEENT: Conjunctivae and lids unremarkable. Cardiovascular: Regular rhythm.  Lungs: Normal work of breathing. Neurologic: No focal deficits.   Lab Results  Component Value Date   CREATININE 1.21 09/08/2020   BUN 20 09/08/2020   NA 133 (L) 09/08/2020   K 4.5 09/08/2020   CL 98 09/08/2020   CO2 28 09/08/2020   Lab Results  Component Value Date   ALT 24 09/08/2020   AST 20 09/08/2020   ALKPHOS 88 09/08/2020   BILITOT 1.0 09/08/2020   Lab Results  Component Value Date   HGBA1C 6.2 11/23/2020   HGBA1C 5.8 (A) 06/03/2020   HGBA1C 6.8 (H) 04/21/2019   HGBA1C 6.0 (A) 03/06/2018   HGBA1C 6.8 (H) 12/06/2017   Lab Results  Component Value Date   INSULIN 22.6 07/25/2020   Lab Results  Component Value Date   TSH 2.050 07/25/2020   Lab Results  Component Value Date   CHOL 115 07/25/2020   HDL 39 (L) 07/25/2020   LDLCALC 44 07/25/2020   TRIG 194 (H) 07/25/2020   CHOLHDL 3.0 04/14/2020   Lab Results  Component Value Date   VD25OH  39.1 07/25/2020   Lab Results  Component Value Date   WBC 10.8 (H) 02/06/2021   HGB 15.9 02/06/2021   HCT 49.1 02/06/2021   MCV 94.4 02/06/2021   PLT 194 02/06/2021    Attestation Statements:   Reviewed by clinician on day of visit: allergies, medications, problem list, medical history, surgical history, family history, social history, and previous encounter notes.  Time spent on visit including pre-visit chart review and post-visit care and charting was 15 minutes.   Coral Ceo, CMA, am acting as transcriptionist for Coralie Common, MD.   I have reviewed the above documentation for accuracy and completeness, and I agree with the above. - Coralie Common, MD

## 2021-04-10 ENCOUNTER — Ambulatory Visit (INDEPENDENT_AMBULATORY_CARE_PROVIDER_SITE_OTHER): Payer: Medicare PPO | Admitting: Family Medicine

## 2021-04-11 ENCOUNTER — Other Ambulatory Visit: Payer: Self-pay | Admitting: Family Medicine

## 2021-04-26 ENCOUNTER — Other Ambulatory Visit: Payer: Self-pay | Admitting: Family Medicine

## 2021-04-26 ENCOUNTER — Other Ambulatory Visit: Payer: Self-pay

## 2021-04-26 ENCOUNTER — Encounter (INDEPENDENT_AMBULATORY_CARE_PROVIDER_SITE_OTHER): Payer: Self-pay | Admitting: Family Medicine

## 2021-04-26 ENCOUNTER — Ambulatory Visit (INDEPENDENT_AMBULATORY_CARE_PROVIDER_SITE_OTHER): Payer: Medicare PPO | Admitting: Family Medicine

## 2021-04-26 VITALS — BP 117/75 | HR 72 | Temp 97.8°F | Ht 68.0 in | Wt 285.0 lb

## 2021-04-26 DIAGNOSIS — Z6841 Body Mass Index (BMI) 40.0 and over, adult: Secondary | ICD-10-CM | POA: Diagnosis not present

## 2021-04-26 DIAGNOSIS — E559 Vitamin D deficiency, unspecified: Secondary | ICD-10-CM

## 2021-04-26 DIAGNOSIS — D751 Secondary polycythemia: Secondary | ICD-10-CM

## 2021-04-26 DIAGNOSIS — E291 Testicular hypofunction: Secondary | ICD-10-CM

## 2021-04-27 NOTE — Progress Notes (Signed)
Chief Complaint:   OBESITY David Brandt is here to discuss his progress with his obesity treatment plan along with follow-up of his obesity related diagnoses. David Brandt is on [facility] and states he is following his eating plan approximately 100% of the time. David Brandt states he is walking 45 minutes 5 times per week.  Today's visit was #: 12 Starting weight: 302 lbs Starting date: 07/25/2020 Today's weight: 285 lbs Today's date: 04/26/2021 Total lbs lost to date: 17 Total lbs lost since last in-office visit: 3  Interim History: David Brandt enlisted in the program called [facility] and following their food plan. He is taking lipotropic drops (with goal to eat 5000 cal/day). Lunch and dinner is 4 oz protein, 4 oz vegetables. Pt is not hungry and has lots of available snacks to eat. He is getting shock therapy on feet and legs.  Subjective:   1. 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 He underwent phlebotomy numerous rounds.  Assessment/Plan:   1. Polycythemia, secondary Follow up with hematology/oncology.  2. Obesity BMI today 38  David Brandt is currently in the action stage of change. As such, his goal is to continue with weight loss efforts. He has agreed to [facility] Plan.   Exercise goals: All adults should avoid inactivity. Some physical activity is better than none, and adults who participate in any amount of physical activity gain some health benefits.  Behavioral modification strategies: increasing lean protein intake, meal planning and cooking strategies, keeping healthy foods in the home, and planning for success.  David Brandt has agreed to follow-up with our clinic as PRN. He was informed of the importance of frequent follow-up visits to maximize his success with intensive lifestyle modifications for his multiple health conditions.   Objective:   Blood pressure 117/75, pulse 72, temperature 97.8 F (36.6 C), height 5\' 8"  (1.727 m), weight  285 lb (129.3 kg), SpO2 97 %. Body mass index is 43.33 kg/m.  General: Cooperative, alert, well developed, in no acute distress. HEENT: Conjunctivae and lids unremarkable. Cardiovascular: Regular rhythm.  Lungs: Normal work of breathing. Neurologic: No focal deficits.   Lab Results  Component Value Date   CREATININE 1.21 09/08/2020   BUN 20 09/08/2020   NA 133 (L) 09/08/2020   K 4.5 09/08/2020   CL 98 09/08/2020   CO2 28 09/08/2020   Lab Results  Component Value Date   ALT 24 09/08/2020   AST 20 09/08/2020   ALKPHOS 88 09/08/2020   BILITOT 1.0 09/08/2020   Lab Results  Component Value Date   HGBA1C 6.2 11/23/2020   HGBA1C 5.8 (A) 06/03/2020   HGBA1C 6.8 (H) 04/21/2019   HGBA1C 6.0 (A) 03/06/2018   HGBA1C 6.8 (H) 12/06/2017   Lab Results  Component Value Date   INSULIN 22.6 07/25/2020   Lab Results  Component Value Date   TSH 2.050 07/25/2020   Lab Results  Component Value Date   CHOL 115 07/25/2020   HDL 39 (L) 07/25/2020   LDLCALC 44 07/25/2020   TRIG 194 (H) 07/25/2020   CHOLHDL 3.0 04/14/2020   Lab Results  Component Value Date   VD25OH 39.1 07/25/2020   Lab Results  Component Value Date   WBC 10.8 (H) 02/06/2021   HGB 15.9 02/06/2021   HCT 49.1 02/06/2021   MCV 94.4 02/06/2021   PLT 194 02/06/2021    Attestation Statements:   Reviewed by clinician on day of visit: allergies, medications, problem list, medical history, surgical history, family history, social  history, and previous encounter notes.  Time spent on visit including pre-visit chart review and post-visit care and charting was 12 minutes.   Coral Ceo, CMA, am acting as transcriptionist for Coralie Common, MD.   I have reviewed the above documentation for accuracy and completeness, and I agree with the above. - Coralie Common, MD

## 2021-05-02 ENCOUNTER — Other Ambulatory Visit: Payer: Self-pay | Admitting: Cardiovascular Disease

## 2021-05-09 ENCOUNTER — Other Ambulatory Visit: Payer: Self-pay | Admitting: Family Medicine

## 2021-05-09 ENCOUNTER — Other Ambulatory Visit: Payer: Self-pay

## 2021-05-09 DIAGNOSIS — E291 Testicular hypofunction: Secondary | ICD-10-CM

## 2021-05-10 ENCOUNTER — Other Ambulatory Visit: Payer: Self-pay

## 2021-05-10 MED ORDER — SERTRALINE HCL 50 MG PO TABS: 50.0000 mg | ORAL_TABLET | Freq: Every day | ORAL | 2 refills | Status: DC

## 2021-05-11 MED ORDER — TESTOSTERONE CYPIONATE 200 MG/ML IM SOLN: INTRAMUSCULAR | 5 refills | Status: DC

## 2021-05-17 ENCOUNTER — Other Ambulatory Visit: Payer: Self-pay | Admitting: Family Medicine

## 2021-05-17 ENCOUNTER — Other Ambulatory Visit: Payer: Self-pay | Admitting: General Practice

## 2021-05-17 DIAGNOSIS — E291 Testicular hypofunction: Secondary | ICD-10-CM

## 2021-05-17 DIAGNOSIS — E1169 Type 2 diabetes mellitus with other specified complication: Secondary | ICD-10-CM

## 2021-05-23 MED ORDER — TESTOSTERONE CYPIONATE 200 MG/ML IM SOLN: INTRAMUSCULAR | 5 refills | Status: DC

## 2021-05-23 NOTE — Telephone Encounter (Signed)
Done

## 2021-06-07 NOTE — Progress Notes (Signed)
David Brandt CONSULT NOTE  Patient Care Team: [name], MD as PCP - General (Family Medicine) Skeet Latch, MD as PCP - Cardiology (Cardiology) Clark-Burning, Anderson Malta, PA-C (Inactive) (Dermatology)  CHIEF COMPLAINTS/PURPOSE OF CONSULTATION:  Polycythemia, secondary, follow up  ASSESSMENT & PLAN:   #1.  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 He restarted on testosterone injections in September.  Today his labs showed a hematocrit of 55.  He denies any hyperviscosity symptoms.  Physical examination unremarkable.  No palpable lymphadenopathy or hepatosplenomegaly. I have again reviewed that polycythemia is likely from testosterone injections since this has resolved when he discontinued testosterone.  He was recommended to speak to his doctor if the testosterone dose can be reduced.  We have discussed that polycythemia can increase risk of cardiovascular event such as heart attacks, strokes as well as increased risk of blood clots which can be life-threatening. Phlebotomy today.  Monthly CBC and phlebotomy if hematocrit greater than 51.  He was asked to stop by at scheduling to schedule his follow-up phlebotomy appointments, follow-up visit as well as monthly labs.  He expressed understanding.  2. OSA on CPAP, patient diligent about using it every day.  3.  Weight loss of 20 pounds since last visit, intentional.  Patient has been working with obesity clinic and has been successfully losing weight. 4.  Chronic back pain.  He has been using marijuana twice a week for management of his pain.  Return to clinic in 3 to 4 months with me or APP. Thank you for consulting [name] in the care of this patient.  Please do not hesitate to contact us with any additional questions or  concerns.  HISTORY OF PRESENTING ILLNESS:  David Brandt 75 y.o. male is here because of polycythemia.  This is a very pleasant 75 year old male patient with past medical history significant for diabetes, obesity, hypertension, dyslipidemia, obstructive sleep apnea who is referred to hematology for evaluation of secondary polycythemia likely from testosterone supplementation.  Interval history He is feeling well over all. He has started taking testosterone every 14 days, back on it since September. No other health complaints Started zoloft for anxiety. No change in breathing. No hematochezia or melena. NO change in urinary habits. Smoking marijuana twice a week for back pain. He lost 20 lbs weight working with obesity clinic. No hyperviscosity symptoms.3  Rest of the pertinent 10 point ROS reviewed and neg.  MEDICAL HISTORY:  Past Medical History:  Diagnosis Date   Abnormal cardiovascular stress test 03/29/2016   Allergic rhinitis    Allergy    Arthritis    Asthma    Back pain    Diabetes ([place]) 03/29/2016   Gout    History of kidney stones    Hyperlipidemia    Hypertension    Joint pain    Multiple food allergies    Prediabetes    Sleep apnea    cpap    SURGICAL HISTORY: Past Surgical History:  Procedure Laterality Date   CARDIAC CATHETERIZATION N/A 03/29/2016   Procedure: Right/Left Heart Cath and Coronary Angiography;  Surgeon: Nelva Bush, MD;  Location: [facility];  Service: Cardiovascular;  Laterality: N/A;   CARDIAC CATHETERIZATION N/A 03/29/2016   Procedure: Intravascular Pressure Wire/FFR Study;  Surgeon: Nelva Bush, MD;  Location: [facility];  Service: Cardiovascular;  Laterality: N/A;  CARDIAC CATHETERIZATION Left 04/02/2016   Procedure: FEMORAL ARTERIAL LINE INSERTION;  Surgeon: [name], MD;  Location: [facility];  Service: Open Heart Surgery;  Laterality: Left;   COLONOSCOPY  02/20/2018   per Dr. 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   Procedure: CORONARY ARTERY BYPASS GRAFTING (CABG) X 5 UTILIZING LEFT INTERNAL MAMMARY ARTERY AND ENDOSCOPICALLY HARVESTED GREATER SAPHENOUS VEIN ; Mammary to LAD, Sequencial 1st to 2nd Diagonal, Vein to OM, Vein to Distal RCA.;  Surgeon: [name], MD;  Location: [facility];  Service: Open Heart Surgery;  Laterality: N/A;   CYSTOSCOPY/URETEROSCOPY/HOLMIUM LASER/STENT PLACEMENT Bilateral 05/07/2019   Procedure: LEFT URETEROSCOPY/HOLMIUM LASER/STENT PLACEMENT/BIOPSY LEFT MID URETER/RIGHT RETROGRADE STENT PLACEMENT ;  Surgeon: [name], MD;  Location: [facility];  Service: Urology;  Laterality: Bilateral;   CYSTOSCOPY/URETEROSCOPY/HOLMIUM LASER/STENT PLACEMENT Bilateral 05/27/2019   Procedure: CYSTOSCOPY, URETEROSCOPY/ RETROGRADE PYELOGRAM/ HOLMIUM LASER/STENT EXCHANGE;  Surgeon: [name], MD;  Location: [facility];  Service: Urology;  Laterality: Bilateral;   LYMPH NODE BIOPSY Left 04/02/2016   Procedure: INCIDENTAL LEFT MAMMARY LYMPH NODE BIOPSY;  Surgeon: [name], MD;  Location: [facility];  Service: Open Heart Surgery;  Laterality: Left;   mass abdomen  1955   NEPHROLITHOTOMY Left 04/24/2019   Procedure: NEPHROLITHOTOMY PERCUTANEOUS WITH SURGEON ACCESS;  Surgeon: [name], MD;  Location: [facility];  Service: Urology;  Laterality: Left;   TEE WITHOUT CARDIOVERSION N/A 04/02/2016   Procedure: TRANSESOPHAGEAL ECHOCARDIOGRAM (TEE);  Surgeon: [name], MD;  Location: [facility];  Service: Open Heart Surgery;  Laterality: N/A;    SOCIAL HISTORY: Social History   Socioeconomic History   Marital status: Married    Spouse name: Hassan Rowan   Number of children: Y   Years of education: Not on file   Highest education level: Not on file  Occupational History   Not on file  Tobacco Use   Smoking status: Former    Packs/day: 1.00    Years: 47.00    Pack years: 47.00    Types:  Cigarettes    Quit date: 06/25/2018    Years since quitting: 2.9   Smokeless tobacco: Never  Vaping Use   Vaping Use: Never used  Substance and Sexual Activity   Alcohol use: Yes    Alcohol/week: 4.0 standard drinks    Types: 4 Cans of beer per week   Drug use: Yes    Frequency: 7.0 times per week    Types: Marijuana    Comment: marijuana-states still smoking 10/19/2020   Sexual activity: Not on file  Other Topics Concern   Not on file  Social History Narrative   Not on file   Social Determinants of Health   Financial Resource Strain: Low Risk    Difficulty of Paying Living Expenses: Not hard at all  Food Insecurity: No Food Insecurity   Worried About Charity fundraiser in the Last Year: Never true   Ran Out of Food in the Last Year: Never true  Transportation Needs: No Transportation Needs   Lack of Transportation (Medical): No   Lack of Transportation (Non-Medical): No  Physical Activity: Insufficiently Active   Days of Exercise per Week: 2 days   Minutes of Exercise per Session: 60 min  Stress: No Stress Concern Present   Feeling of Stress : Not at all  Social Connections: Moderately Integrated   Frequency of Communication with Friends and Family: More  than three times a week   Frequency of Social Gatherings with Friends and Family: More than three times a week   Attends Religious Services: 1 to 4 times per year   Active Member of [employer] or Organizations: No   Attends Music therapist: Never   Marital Status: Married  Human resources officer Violence: Not At Risk   Fear of Current or Ex-Partner: No   Emotionally Abused: No   Physically Abused: No   Sexually Abused: No    FAMILY HISTORY: Family History  Problem Relation Age of Onset   Colon cancer Father 66   Cancer Father    Alcohol abuse Father    Stroke Mother    Diabetes Mother    Hypertension Mother    Hyperlipidemia Mother    Obesity Mother    Heart disease Maternal Aunt    Diabetes Maternal  Uncle    Stomach cancer Neg Hx    Esophageal cancer Neg Hx    Rectal cancer Neg Hx     ALLERGIES:  is allergic to allopurinol, justicia adhatoda (malabar nut tree) [justicia adhatoda], valsartan-hydrochlorothiazide, and sulfa antibiotics.  MEDICATIONS:  Current Outpatient Medications  Medication Sig Dispense Refill   albuterol (VENTOLIN HFA) 108 (90 Base) MCG/ACT inhaler Inhale 2 puffs into the lungs every 4 (four) hours as needed for wheezing or shortness of breath. 8 g 5   Alcohol Swabs (DROPSAFE ALCOHOL PREP) 70 % PADS USE AS DIRECTED 300 each 0   Apoaequorin (PREVAGEN PO) Take 20 mg by mouth.     aspirin EC 81 MG tablet Take 81 mg by mouth daily.     atorvastatin (LIPITOR) 80 MG tablet TAKE 1 TABLET (80 MG TOTAL) BY MOUTH DAILY AT 6 PM. 90 tablet 1   carvedilol (COREG) 25 MG tablet TAKE 1 TABLET TWICE DAILY (PLEASE SCHEDULE AN APPT FOR FUTURE REFILLS) 180 tablet 2   celecoxib (CELEBREX) 200 MG capsule      Cholecalciferol (VITAMIN D3) 25 MCG (1000 UT) CAPS Take 2,000 Units by mouth daily.     felodipine (PLENDIL) 10 MG 24 hr tablet TAKE 1 TABLET BY MOUTH EVERY DAY 90 tablet 3   gabapentin (NEURONTIN) 300 MG capsule Take 1 capsule (300 mg total) by mouth 3 (three) times daily. 90 capsule 5   hydrALAZINE (APRESOLINE) 50 MG tablet TAKE 1 TABLET BY MOUTH THREE TIMES A DAY 270 tablet 1   hydrochlorothiazide (HYDRODIURIL) 25 MG tablet TAKE 1 TABLET EVERY DAY (NEED MD APPOINTMENT FOR REFILLS) 90 tablet 2   metFORMIN (GLUCOPHAGE-XR) 500 MG 24 hr tablet TAKE 1 TABLET EVERY DAY 90 tablet 3   OVER THE COUNTER MEDICATION Take 2 capsules by mouth 2 (two) times daily. OmegaXL     POTASSIUM CITRATE PO Take 15 mEq by mouth.     sertraline (ZOLOFT) 50 MG tablet Take 1 tablet (50 mg total) by mouth daily. 90 tablet 2   spironolactone (ALDACTONE) 25 MG tablet TAKE 1 TABLET EVERY DAY 90 tablet 0   SYMBICORT 160-4.5 MCG/ACT inhaler TAKE 2 PUFFS BY MOUTH TWICE A DAY 30.6 each 3   testosterone cypionate  (DEPOTESTOSTERONE CYPIONATE) 200 MG/ML injection INJECT 1 ML INTO THE MUSCELE EVERY 14 DAYS 6 mL 5   No current facility-administered medications for this visit.     PHYSICAL EXAMINATION: ECOG PERFORMANCE STATUS: 0 - Asymptomatic  Vitals:   06/08/21 0901  BP: (!) 119/56  Pulse: 90  Resp: 18  Temp: (!) 97.2 F (36.2 C)  SpO2: 91%  Filed Weights   06/08/21 0901  Weight: 287 lb 6 oz (130.4 kg)    Physical Exam Constitutional:      Appearance: Normal appearance. He is obese.  HENT:     Head: Normocephalic and atraumatic.  Cardiovascular:     Rate and Rhythm: Normal rate and regular rhythm.     Pulses: Normal pulses.     Heart sounds: Normal heart sounds.  Pulmonary:     Effort: Pulmonary effort is normal.     Breath sounds: Normal breath sounds.  Abdominal:     General: Abdomen is flat.     Palpations: Abdomen is soft. There is no mass.  Musculoskeletal:        General: Normal range of motion.     Cervical back: Normal range of motion and neck supple. No rigidity.  Lymphadenopathy:     Cervical: No cervical adenopathy.  Skin:    General: Skin is warm and dry.  Neurological:     General: No focal deficit present.     Mental Status: He is alert.  Psychiatric:        Mood and Affect: Mood normal.        Behavior: Behavior normal.    LABORATORY DATA:  I have reviewed the data as listed Lab Results  Component Value Date   WBC 9.2 06/08/2021   HGB 18.2 (H) 06/08/2021   HCT 55.4 (H) 06/08/2021   MCV 89.5 06/08/2021   PLT 222 06/08/2021     Chemistry      Component Value Date/Time   NA 133 (L) 09/08/2020 1127   NA 140 07/25/2020 1134   K 4.5 09/08/2020 1127   CL 98 09/08/2020 1127   CO2 28 09/08/2020 1127   BUN 20 09/08/2020 1127   BUN 11 07/25/2020 1134   CREATININE 1.21 09/08/2020 1127   CREATININE 1.03 05/02/2016 0946      Component Value Date/Time   CALCIUM 9.6 09/08/2020 1127   ALKPHOS 88 09/08/2020 1127   AST 20 09/08/2020 1127   ALT 24  09/08/2020 1127   BILITOT 1.0 09/08/2020 1127      RADIOGRAPHIC STUDIES: I have personally reviewed the radiological images as listed and agreed with the findings in the report. No results found.  I have spent 20 minutes in the care of this patient including history and physical, review of records, discussion    Benay Pike, MD 06/08/2021 9:36 AM

## 2021-06-08 ENCOUNTER — Inpatient Hospital Stay: Payer: Medicare PPO | Attending: Hematology and Oncology | Admitting: Hematology and Oncology

## 2021-06-08 ENCOUNTER — Other Ambulatory Visit: Payer: Self-pay

## 2021-06-08 ENCOUNTER — Encounter: Payer: Self-pay | Admitting: Hematology and Oncology

## 2021-06-08 ENCOUNTER — Inpatient Hospital Stay: Payer: Medicare PPO

## 2021-06-08 ENCOUNTER — Ambulatory Visit: Payer: Medicare PPO

## 2021-06-08 VITALS — BP 119/56 | HR 90 | Temp 97.2°F | Resp 18 | Wt 287.4 lb

## 2021-06-08 DIAGNOSIS — F1290 Cannabis use, unspecified, uncomplicated: Secondary | ICD-10-CM | POA: Insufficient documentation

## 2021-06-08 DIAGNOSIS — D751 Secondary polycythemia: Secondary | ICD-10-CM

## 2021-06-08 DIAGNOSIS — R634 Abnormal weight loss: Secondary | ICD-10-CM | POA: Diagnosis not present

## 2021-06-08 LAB — CBC WITH DIFFERENTIAL/PLATELET
Abs Immature Granulocytes: 0.02 10*3/uL (ref 0.00–0.07)
Basophils Absolute: 0.1 10*3/uL (ref 0.0–0.1)
Basophils Relative: 1 %
Eosinophils Absolute: 0.4 10*3/uL (ref 0.0–0.5)
Eosinophils Relative: 4 %
HCT: 55.4 % — ABNORMAL HIGH (ref 39.0–52.0)
Hemoglobin: 18.2 g/dL — ABNORMAL HIGH (ref 13.0–17.0)
Immature Granulocytes: 0 %
Lymphocytes Relative: 15 %
Lymphs Abs: 1.4 10*3/uL (ref 0.7–4.0)
MCH: 29.4 pg (ref 26.0–34.0)
MCHC: 32.9 g/dL (ref 30.0–36.0)
MCV: 89.5 fL (ref 80.0–100.0)
Monocytes Absolute: 1.1 10*3/uL — ABNORMAL HIGH (ref 0.1–1.0)
Monocytes Relative: 12 %
Neutro Abs: 6.3 10*3/uL (ref 1.7–7.7)
Neutrophils Relative %: 68 %
Platelets: 222 10*3/uL (ref 150–400)
RBC: 6.19 MIL/uL — ABNORMAL HIGH (ref 4.22–5.81)
RDW: 14.6 % (ref 11.5–15.5)
WBC: 9.2 10*3/uL (ref 4.0–10.5)
nRBC: 0 % (ref 0.0–0.2)

## 2021-06-08 NOTE — Patient Instructions (Signed)

## 2021-06-08 NOTE — Progress Notes (Signed)
David Brandt presents today for phlebotomy per MD orders. Phlebotomy procedure started at 1010 and ended at 1030 w/ 18 g iv set in L hand. 509 grams removed. Patient observed for 30 minutes after procedure without any incident. Patient tolerated procedure well. IV needle removed intact.

## 2021-06-08 NOTE — Progress Notes (Signed)
Phlebotomy began in LAC using 16G kit at 3818 and was not able to be completed due to IV clotting off. Hadley Pen., RN, resumed phlebotomy in L hand with success.

## 2021-06-12 ENCOUNTER — Ambulatory Visit: Payer: Medicare PPO | Admitting: Family Medicine

## 2021-06-12 ENCOUNTER — Encounter: Payer: Self-pay | Admitting: Family Medicine

## 2021-06-12 VITALS — BP 104/60 | HR 86 | Temp 98.1°F | Wt 287.0 lb

## 2021-06-12 DIAGNOSIS — D751 Secondary polycythemia: Secondary | ICD-10-CM

## 2021-06-12 DIAGNOSIS — E291 Testicular hypofunction: Secondary | ICD-10-CM

## 2021-06-12 MED ORDER — TESTOSTERONE CYPIONATE 200 MG/ML IM SOLN: 100.0000 mg | INTRAMUSCULAR | 5 refills | Status: DC

## 2021-06-12 MED ORDER — SERTRALINE HCL 50 MG PO TABS: 100.0000 mg | ORAL_TABLET | Freq: Every day | ORAL | 2 refills | Status: DC

## 2021-06-12 NOTE — Progress Notes (Signed)
° °  Subjective:    Patient ID: David Brandt, male    DOB: 04/28/1946, 74 y.o.   MRN: [id number]  HPI Here to follow up on hypogonadism, polycythemia, and ED. He developed polycythemia earlier this year, and we referred him to Hematology. They felt the polycythemia was secondary to using testosterone, so we stopped it for a few months. Then in August his Hgb went down to 15.9. after that he restarted the testosterone, and the Hgb was back up to 18.2 (HCT was 55) on 06-08-21. Hematology recommended he stop or at least decrease the dose of testosterone. He says he gets no erections at all if he totally stops it. Also his anxiety has been more of an issue lately. He started on Zoloft 50 mg daily in July, and it has not helped as much as he had hoped.    Review of Systems  Constitutional: Negative.   Respiratory: Negative.    Cardiovascular: Negative.   Psychiatric/Behavioral:  Negative for agitation, behavioral problems, confusion, decreased concentration, dysphoric mood and sleep disturbance. The patient is nervous/anxious.       Objective:   Physical Exam Constitutional:      General: He is not in acute distress.    Appearance: Normal appearance.  Cardiovascular:     Rate and Rhythm: Normal rate and regular rhythm.     Pulses: Normal pulses.     Heart sounds: Normal heart sounds.  Pulmonary:     Effort: Pulmonary effort is normal.     Breath sounds: Normal breath sounds.  Neurological:     Mental Status: He is alert.          Assessment & Plan:  He will continue to see Hematology for phlebotomies as long as his HCT remains above 51. For the hypogonadism, we agreed to decrease the dose of testosterone to 100 mg (0.5 ml) every 14 days. We will recheck a level in 90 days. For the anxiety, we will increase the Zoloft to 100 mg daily. We spent a total of (31   ) minutes reviewing records and discussing these issues.  Alysia Penna, MD

## 2021-07-04 ENCOUNTER — Other Ambulatory Visit: Payer: Self-pay | Admitting: Family Medicine

## 2021-07-07 ENCOUNTER — Telehealth: Payer: Self-pay | Admitting: Family Medicine

## 2021-07-07 ENCOUNTER — Other Ambulatory Visit: Payer: Self-pay

## 2021-07-07 DIAGNOSIS — F419 Anxiety disorder, unspecified: Secondary | ICD-10-CM

## 2021-07-07 MED ORDER — SERTRALINE HCL 50 MG PO TABS: 100.0000 mg | ORAL_TABLET | Freq: Every day | ORAL | 2 refills | Status: DC

## 2021-07-07 NOTE — Telephone Encounter (Signed)
Spoke with patient, refill sent to [facility] on [address]

## 2021-07-07 NOTE — Telephone Encounter (Signed)
Patient called because pharmacy needs a new prescription for sertraline (ZOLOFT) 50 MG tablet     Please send to   [id number] - [place], [address] Phone:  [phone]  Fax:  [phone]         Please advise

## 2021-07-10 ENCOUNTER — Inpatient Hospital Stay: Payer: Medicare PPO | Attending: Hematology and Oncology

## 2021-07-10 ENCOUNTER — Inpatient Hospital Stay: Payer: Medicare PPO

## 2021-07-10 ENCOUNTER — Other Ambulatory Visit: Payer: Self-pay | Admitting: Family Medicine

## 2021-07-10 ENCOUNTER — Other Ambulatory Visit: Payer: Self-pay

## 2021-07-10 VITALS — BP 127/64 | HR 54 | Temp 98.1°F | Resp 16

## 2021-07-10 DIAGNOSIS — Z87891 Personal history of nicotine dependence: Secondary | ICD-10-CM | POA: Diagnosis not present

## 2021-07-10 DIAGNOSIS — D751 Secondary polycythemia: Secondary | ICD-10-CM | POA: Insufficient documentation

## 2021-07-10 LAB — CBC WITH DIFFERENTIAL/PLATELET
Abs Immature Granulocytes: 0.03 10*3/uL (ref 0.00–0.07)
Basophils Absolute: 0.1 10*3/uL (ref 0.0–0.1)
Basophils Relative: 1 %
Eosinophils Absolute: 0.7 10*3/uL — ABNORMAL HIGH (ref 0.0–0.5)
Eosinophils Relative: 6 %
HCT: 52.8 % — ABNORMAL HIGH (ref 39.0–52.0)
Hemoglobin: 16.9 g/dL (ref 13.0–17.0)
Immature Granulocytes: 0 %
Lymphocytes Relative: 15 %
Lymphs Abs: 1.8 10*3/uL (ref 0.7–4.0)
MCH: 29 pg (ref 26.0–34.0)
MCHC: 32 g/dL (ref 30.0–36.0)
MCV: 90.6 fL (ref 80.0–100.0)
Monocytes Absolute: 1.4 10*3/uL — ABNORMAL HIGH (ref 0.1–1.0)
Monocytes Relative: 12 %
Neutro Abs: 7.8 10*3/uL — ABNORMAL HIGH (ref 1.7–7.7)
Neutrophils Relative %: 66 %
Platelets: 178 10*3/uL (ref 150–400)
RBC: 5.83 MIL/uL — ABNORMAL HIGH (ref 4.22–5.81)
RDW: 15 % (ref 11.5–15.5)
WBC: 11.7 10*3/uL — ABNORMAL HIGH (ref 4.0–10.5)
nRBC: 0 % (ref 0.0–0.2)

## 2021-07-10 NOTE — Progress Notes (Signed)
David Brandt presents today for phlebotomy per MD orders. Phlebotomy procedure started at 0913 and ended at 0920. 503 grams removed via 16 G needle to [place]. IV needle removed intact. Patient observed for 30 minutes after procedure without any incident. Patient offered food and drink - pt accepted drink.  Patient tolerated procedure well.

## 2021-07-10 NOTE — Patient Instructions (Signed)
Therapeutic Phlebotomy °Therapeutic phlebotomy is the planned removal of blood from a person's body for the purpose of treating a medical condition. The procedure is lot like donating blood. Usually, about a pint (470 mL, or 0.47 L) of blood is removed. The average adult has 9-12 pints (4.3-5.7 L) of blood in his or her body. °Therapeutic phlebotomy may be used to treat the following medical conditions: °Hemochromatosis. This is a condition in which the blood contains too much iron. °Polycythemia vera. This is a condition in which the blood contains too many red blood cells. °Porphyria cutanea tarda. This is a disease in which an important part of hemoglobin is not made properly. It results in the buildup of abnormal amounts of porphyrins in the body. °Sickle cell disease. This is a condition in which the red blood cells form an abnormal crescent shape rather than a round shape. °Tell a health care provider about: °Any allergies you have. °All medicines you are taking, including vitamins, herbs, eye drops, creams, and over-the-counter medicines. °Any bleeding problems you have. °Any surgeries you have had. °Any medical conditions you have. °Whether you are pregnant or may be pregnant. °What are the risks? °Generally, this is a safe procedure. However, problems may occur, including: °Nausea or light-headedness. °Low blood pressure (hypotension). °Soreness, bleeding, swelling, or bruising at the needle insertion site. °Infection. °What happens before the procedure? °Ask your health care provider about: °Changing or stopping your regular medicines. This is especially important if you are taking diabetes medicines or blood thinners. °Taking medicines such as aspirin and ibuprofen. These medicines can thin your blood. Do not take these medicines unless your health care provider tells you to take them. °Taking over-the-counter medicines, vitamins, herbs, and supplements. °Wear clothing with sleeves that can be raised  above the elbow. °You may have a blood sample taken. °Your blood pressure, pulse rate, and breathing rate will be measured. °What happens during the procedure? ° °You may be given a medicine to numb the area (local anesthetic). °A tourniquet will be placed on your arm. °A needle will be put into one of your veins. °Tubing and a collection bag will be attached to the needle. °Blood will flow through the needle and tubing into the collection bag. °The collection bag will be placed lower than your arm so gravity can help the blood flow into the bag. °You may be asked to open and close your hand slowly and continually during the entire collection. °After the specified amount of blood has been removed from your body, the collection bag and tubing will be clamped. °The needle will be removed from your vein. °Pressure will be held on the needle site to stop the bleeding. °A bandage (dressing) will be placed over the needle insertion site. °The procedure may vary among health care providers and hospitals. °What happens after the procedure? °Your blood pressure, pulse rate, and breathing rate will be measured after the procedure. °You will be encouraged to drink fluids. °You will be encouraged to eat a snack to prevent a low blood sugar level. °Your recovery will be assessed and monitored. °Return to your normal activities as told by your health care provider. °Summary °Therapeutic phlebotomy is the planned removal of blood from a person's body for the purpose of treating a medical condition. °Therapeutic phlebotomy may be used to treat hemochromatosis, polycythemia vera, porphyria cutanea tarda, or sickle cell disease. °In the procedure, a needle is inserted and about a pint (470 mL, or 0.47 L) of blood is   removed. The average adult has 9-12 pints (4.3-5.7 L) of blood in the body. This is generally a safe procedure, but it can sometimes cause problems such as nausea, light-headedness, or low blood pressure  (hypotension). This information is not intended to replace advice given to you by your health care provider. Make sure you discuss any questions you have with your health care provider. Document Revised: 12/07/2020 Document Reviewed: 12/07/2020 Elsevier Patient Education  [place].  Therapeutic Phlebotomy Discharge Instructions  - Increase your fluid intake over the next 4 hours  - No smoking for 30 minutes  - Avoid using the affected arm (the one you had the blood drawn from) for heavy lifting or other activities.  - You may resume all normal activities after 30 minutes.  You are to notify the office if you experience:   - Persistent dizziness and/or lightheadedness -Uncontrolled or excessive bleeding at the site.

## 2021-07-16 ENCOUNTER — Other Ambulatory Visit: Payer: Self-pay | Admitting: General Practice

## 2021-07-17 ENCOUNTER — Other Ambulatory Visit: Payer: Self-pay | Admitting: Cardiovascular Disease

## 2021-07-20 ENCOUNTER — Other Ambulatory Visit: Payer: Self-pay | Admitting: Family Medicine

## 2021-07-20 DIAGNOSIS — E1159 Type 2 diabetes mellitus with other circulatory complications: Secondary | ICD-10-CM

## 2021-07-20 DIAGNOSIS — I152 Hypertension secondary to endocrine disorders: Secondary | ICD-10-CM

## 2021-08-07 ENCOUNTER — Other Ambulatory Visit: Payer: Self-pay

## 2021-08-07 ENCOUNTER — Inpatient Hospital Stay: Payer: Medicare PPO

## 2021-08-07 ENCOUNTER — Inpatient Hospital Stay: Payer: Medicare PPO | Attending: Hematology and Oncology

## 2021-08-07 ENCOUNTER — Ambulatory Visit (INDEPENDENT_AMBULATORY_CARE_PROVIDER_SITE_OTHER): Payer: Medicare PPO

## 2021-08-07 VITALS — Ht 68.0 in | Wt 273.0 lb

## 2021-08-07 DIAGNOSIS — D751 Secondary polycythemia: Secondary | ICD-10-CM | POA: Insufficient documentation

## 2021-08-07 DIAGNOSIS — Z0000 Encounter for general adult medical examination without abnormal findings: Secondary | ICD-10-CM

## 2021-08-07 LAB — CBC WITH DIFFERENTIAL/PLATELET
Abs Immature Granulocytes: 0.04 10*3/uL (ref 0.00–0.07)
Basophils Absolute: 0.1 10*3/uL (ref 0.0–0.1)
Basophils Relative: 1 %
Eosinophils Absolute: 0.6 10*3/uL — ABNORMAL HIGH (ref 0.0–0.5)
Eosinophils Relative: 5 %
HCT: 50.4 % (ref 39.0–52.0)
Hemoglobin: 15.9 g/dL (ref 13.0–17.0)
Immature Granulocytes: 0 %
Lymphocytes Relative: 14 %
Lymphs Abs: 1.6 10*3/uL (ref 0.7–4.0)
MCH: 28.9 pg (ref 26.0–34.0)
MCHC: 31.5 g/dL (ref 30.0–36.0)
MCV: 91.6 fL (ref 80.0–100.0)
Monocytes Absolute: 1.5 10*3/uL — ABNORMAL HIGH (ref 0.1–1.0)
Monocytes Relative: 13 %
Neutro Abs: 7.5 10*3/uL (ref 1.7–7.7)
Neutrophils Relative %: 67 %
Platelets: 172 10*3/uL (ref 150–400)
RBC: 5.5 MIL/uL (ref 4.22–5.81)
RDW: 15 % (ref 11.5–15.5)
WBC: 11.3 10*3/uL — ABNORMAL HIGH (ref 4.0–10.5)
nRBC: 0 % (ref 0.0–0.2)

## 2021-08-07 NOTE — Patient Instructions (Signed)
[name] , Thank you for taking time to come for your Medicare Wellness Visit. I appreciate your ongoing commitment to your health goals. Please review the following plan we discussed and let me know if I can assist you in the future.   Screening recommendations/referrals: Colonoscopy: completed 02/20/2018, due 02/21/2023 Recommended yearly ophthalmology/optometry visit for glaucoma screening and checkup Recommended yearly dental visit for hygiene and checkup  Vaccinations: Influenza vaccine: completed 03/23/2021, due next flu season Pneumococcal vaccine: 04/28/2019 Tdap vaccine: due now Shingles vaccine: completed   Covid-19: completed  Advanced directives: Advance directive discussed with you today.   Conditions/risks identified: none  Next appointment: Follow up in one year for your annual wellness visit.   Preventive Care 13 Years and Older, Male Preventive care refers to lifestyle choices and visits with your health care provider that can promote health and wellness. What does preventive care include? A yearly physical exam. This is also called an annual well check. Dental exams once or twice a year. Routine eye exams. Ask your health care provider how often you should have your eyes checked. Personal lifestyle choices, including: Daily care of your teeth and gums. Regular physical activity. Eating a healthy diet. Avoiding tobacco and drug use. Limiting alcohol use. Practicing safe sex. Taking low doses of aspirin every day. Taking vitamin and mineral supplements as recommended by your health care provider. What happens during an annual well check? The services and screenings done by your health care provider during your annual well check will depend on your age, overall health, lifestyle risk factors, and family history of disease. Counseling  Your health care provider may ask you questions about your: Alcohol use. Tobacco use. Drug use. Emotional well-being. Home  and relationship well-being. Sexual activity. Eating habits. History of falls. Memory and ability to understand (cognition). Work and work Statistician. Screening  You may have the following tests or measurements: Height, weight, and BMI. Blood pressure. Lipid and cholesterol levels. These may be checked every 5 years, or more frequently if you are over 74 years old. Skin check. Lung cancer screening. You may have this screening every year starting at age 58 if you have a 30-pack-year history of smoking and currently smoke or have quit within the past 15 years. Fecal occult blood test (FOBT) of the stool. You may have this test every year starting at age 5. Flexible sigmoidoscopy or colonoscopy. You may have a sigmoidoscopy every 5 years or a colonoscopy every 10 years starting at age 22. Prostate cancer screening. Recommendations will vary depending on your family history and other risks. Hepatitis C blood test. Hepatitis B blood test. Sexually transmitted disease (STD) testing. Diabetes screening. This is done by checking your blood sugar (glucose) after you have not eaten for a while (fasting). You may have this done every 1-3 years. Abdominal aortic aneurysm (AAA) screening. You may need this if you are a current or former smoker. Osteoporosis. You may be screened starting at age 90 if you are at high risk. Talk with your health care provider about your test results, treatment options, and if necessary, the need for more tests. Vaccines  Your health care provider may recommend certain vaccines, such as: Influenza vaccine. This is recommended every year. Tetanus, diphtheria, and acellular pertussis (Tdap, Td) vaccine. You may need a Td booster every 10 years. Zoster vaccine. You may need this after age 34. Pneumococcal 13-valent conjugate (PCV13) vaccine. One dose is recommended after age 67. Pneumococcal polysaccharide (PPSV23) vaccine. One dose is recommended  after age 32. Talk to  your health care provider about which screenings and vaccines you need and how often you need them. This information is not intended to replace advice given to you by your health care provider. Make sure you discuss any questions you have with your health care provider. Document Released: 07/08/2015 Document Revised: 02/29/2016 Document Reviewed: 04/12/2015 Elsevier Interactive Patient Education  2017 [employer] Prevention in the Home Falls can cause injuries. They can happen to people of all ages. There are many things you can do to make your home safe and to help prevent falls. What can I do on the outside of my home? Regularly fix the edges of walkways and driveways and fix any cracks. Remove anything that might make you trip as you walk through a door, such as a raised step or threshold. Trim any bushes or trees on the path to your home. Use bright outdoor lighting. Clear any walking paths of anything that might make someone trip, such as rocks or tools. Regularly check to see if handrails are loose or broken. Make sure that both sides of any steps have handrails. Any raised decks and porches should have guardrails on the edges. Have any leaves, snow, or ice cleared regularly. Use sand or salt on walking paths during winter. Clean up any spills in your garage right away. This includes oil or grease spills. What can I do in the bathroom? Use night lights. Install grab bars by the toilet and in the tub and shower. Do not use towel bars as grab bars. Use non-skid mats or decals in the tub or shower. If you need to sit down in the shower, use a plastic, non-slip stool. Keep the floor dry. Clean up any water that spills on the floor as soon as it happens. Remove soap buildup in the tub or shower regularly. Attach bath mats securely with double-sided non-slip rug tape. Do not have throw rugs and other things on the floor that can make you trip. What can I do in the bedroom? Use  night lights. Make sure that you have a light by your bed that is easy to reach. Do not use any sheets or blankets that are too big for your bed. They should not hang down onto the floor. Have a firm chair that has side arms. You can use this for support while you get dressed. Do not have throw rugs and other things on the floor that can make you trip. What can I do in the kitchen? Clean up any spills right away. Avoid walking on wet floors. Keep items that you use a lot in easy-to-reach places. If you need to reach something above you, use a strong step stool that has a grab bar. Keep electrical cords out of the way. Do not use floor polish or wax that makes floors slippery. If you must use wax, use non-skid floor wax. Do not have throw rugs and other things on the floor that can make you trip. What can I do with my stairs? Do not leave any items on the stairs. Make sure that there are handrails on both sides of the stairs and use them. Fix handrails that are broken or loose. Make sure that handrails are as long as the stairways. Check any carpeting to make sure that it is firmly attached to the stairs. Fix any carpet that is loose or worn. Avoid having throw rugs at the top or bottom of the stairs. If you  do have throw rugs, attach them to the floor with carpet tape. Make sure that you have a light switch at the top of the stairs and the bottom of the stairs. If you do not have them, ask someone to add them for you. What else can I do to help prevent falls? Wear shoes that: Do not have high heels. Have rubber bottoms. Are comfortable and fit you well. Are closed at the toe. Do not wear sandals. If you use a stepladder: Make sure that it is fully opened. Do not climb a closed stepladder. Make sure that both sides of the stepladder are locked into place. Ask someone to hold it for you, if possible. Clearly mark and make sure that you can see: Any grab bars or handrails. First and last  steps. Where the edge of each step is. Use tools that help you move around (mobility aids) if they are needed. These include: Canes. Walkers. Scooters. Crutches. Turn on the lights when you go into a dark area. Replace any light bulbs as soon as they burn out. Set up your furniture so you have a clear path. Avoid moving your furniture around. If any of your floors are uneven, fix them. If there are any pets around you, be aware of where they are. Review your medicines with your doctor. Some medicines can make you feel dizzy. This can increase your chance of falling. Ask your doctor what other things that you can do to help prevent falls. This information is not intended to replace advice given to you by your health care provider. Make sure you discuss any questions you have with your health care provider. Document Released: 04/07/2009 Document Revised: 11/17/2015 Document Reviewed: 07/16/2014 Elsevier Interactive Patient Education  2017 [employer].

## 2021-08-07 NOTE — Progress Notes (Signed)
No phlebotomy needed 08/07/2021, hct 50.4, treatment parameters hct 51. Pt aware and agrees, reminded pt to call if he has any questions or concerns.

## 2021-08-07 NOTE — Progress Notes (Signed)
I connected with David Brandt today by telephone and verified that I am speaking with the correct person using two identifiers. Location patient: home Location provider: work Persons participating in the virtual visit: David, [name].   I discussed the limitations, risks, security and privacy concerns of performing an evaluation and management service by telephone and the availability of in person appointments. I also discussed with the patient that there may be a patient responsible charge related to this service. The patient expressed understanding and verbally consented to this telephonic visit.    Interactive audio and video telecommunications were attempted between this provider and patient, however failed, due to patient having technical difficulties OR patient did not have access to video capability.  We continued and completed visit with audio only.     Vital signs may be patient reported or missing.  Subjective:   David Brandt is a 76 y.o. male who presents for Medicare Annual/Subsequent preventive examination.  Review of Systems     Cardiac Risk Factors include: advanced age (>7men, >29 women);diabetes mellitus;dyslipidemia;hypertension;male gender;obesity (BMI >30kg/m2)     Objective:    Today's Vitals   08/07/21 1511 08/07/21 1512  Weight: 273 lb (123.8 kg)   Height: 5\' 8"  (1.727 m)   PainSc:  3    Body mass index is 41.51 kg/m.  Advanced Directives 08/07/2021 06/08/2021 02/06/2021 10/24/2020 09/08/2020 08/31/2020 05/27/2019  Does Patient Have a Medical Advance Directive? No No No No No No -  Would patient like information on creating a medical advance directive? - Yes (MAU/Ambulatory/Procedural Areas - Information given) No - Patient declined No - Patient declined Yes (MAU/Ambulatory/Procedural Areas - Information given) No - Patient declined No - Guardian declined    Current Medications (verified) Outpatient Encounter Medications as of  08/07/2021  Medication Sig   albuterol (VENTOLIN HFA) 108 (90 Base) MCG/ACT inhaler Inhale 2 puffs into the lungs every 4 (four) hours as needed for wheezing or shortness of breath.   Alcohol Swabs (DROPSAFE ALCOHOL PREP) 70 % PADS USE AS DIRECTED   Apoaequorin (PREVAGEN PO) Take 20 mg by mouth.   aspirin EC 81 MG tablet Take 81 mg by mouth daily.   atorvastatin (LIPITOR) 80 MG tablet TAKE 1 TABLET (80 MG TOTAL) BY MOUTH DAILY AT 6 PM.   carvedilol (COREG) 25 MG tablet TAKE 1 TABLET TWICE DAILY (PLEASE SCHEDULE AN APPT FOR FUTURE REFILLS)   celecoxib (CELEBREX) 200 MG capsule    Cholecalciferol (VITAMIN D3) 25 MCG (1000 UT) CAPS Take 2,000 Units by mouth daily.   felodipine (PLENDIL) 10 MG 24 hr tablet TAKE 1 TABLET BY MOUTH EVERY DAY   gabapentin (NEURONTIN) 300 MG capsule Take 1 capsule (300 mg total) by mouth 3 (three) times daily.   hydrALAZINE (APRESOLINE) 50 MG tablet TAKE 1 TABLET BY MOUTH THREE TIMES A DAY   hydrochlorothiazide (MICROZIDE) 12.5 MG capsule TAKE 1 CAPSULE BY MOUTH EVERY DAY   metFORMIN (GLUCOPHAGE-XR) 500 MG 24 hr tablet TAKE 1 TABLET EVERY DAY   OVER THE COUNTER MEDICATION Take 2 capsules by mouth 2 (two) times daily. OmegaXL   POTASSIUM CITRATE PO Take 15 mEq by mouth.   sertraline (ZOLOFT) 50 MG tablet Take 2 tablets (100 mg total) by mouth daily.   spironolactone (ALDACTONE) 25 MG tablet TAKE 1 TABLET EVERY DAY   SYMBICORT 160-4.5 MCG/ACT inhaler TAKE 2 PUFFS BY MOUTH TWICE A DAY   testosterone cypionate (DEPOTESTOSTERONE CYPIONATE) 200 MG/ML injection Inject 0.5 mLs (100 mg total) into the  skin every 14 (fourteen) days. INJECT 1 ML INTO THE MUSCELE EVERY 14 DAYS   TRUE METRIX BLOOD GLUCOSE TEST test strip TEST BLOOD SUGAR AS DIRECTED   hydrochlorothiazide (HYDRODIURIL) 25 MG tablet TAKE 1 TABLET EVERY DAY (NEED MD APPOINTMENT FOR REFILLS) (Patient not taking: Reported on 08/07/2021)   No facility-administered encounter medications on file as of 08/07/2021.     Allergies (verified) Allopurinol, Justicia adhatoda (malabar nut tree) [justicia adhatoda], Valsartan-hydrochlorothiazide, and Sulfa antibiotics   History: Past Medical History:  Diagnosis Date   Abnormal cardiovascular stress test 03/29/2016   Allergic rhinitis    Allergy    Arthritis    Asthma    Back pain    Diabetes ([place]) 03/29/2016   Gout    History of kidney stones    Hyperlipidemia    Hypertension    Joint pain    Multiple food allergies    Prediabetes    Sleep apnea    cpap   Past Surgical History:  Procedure Laterality Date   CARDIAC CATHETERIZATION N/A 03/29/2016   Procedure: Right/Left Heart Cath and Coronary Angiography;  Surgeon: Nelva Bush, MD;  Location: [facility];  Service: Cardiovascular;  Laterality: N/A;   CARDIAC CATHETERIZATION N/A 03/29/2016   Procedure: Intravascular Pressure Wire/FFR Study;  Surgeon: Nelva Bush, MD;  Location: [facility];  Service: Cardiovascular;  Laterality: N/A;   CARDIAC CATHETERIZATION Left 04/02/2016   Procedure: FEMORAL ARTERIAL LINE INSERTION;  Surgeon: [name], MD;  Location: [place];  Service: Open Heart Surgery;  Laterality: Left;   COLONOSCOPY  02/20/2018   per Dr. 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   Procedure: CORONARY ARTERY BYPASS GRAFTING (CABG) X 5 UTILIZING LEFT INTERNAL MAMMARY ARTERY AND ENDOSCOPICALLY HARVESTED GREATER SAPHENOUS VEIN ; Mammary to LAD, Sequencial 1st to 2nd Diagonal, Vein to OM, Vein to Distal RCA.;  Surgeon: [name], MD;  Location: [place];  Service: Open Heart Surgery;  Laterality: N/A;   CYSTOSCOPY/URETEROSCOPY/HOLMIUM LASER/STENT PLACEMENT Bilateral 05/07/2019   Procedure: LEFT URETEROSCOPY/HOLMIUM LASER/STENT PLACEMENT/BIOPSY LEFT MID URETER/RIGHT RETROGRADE STENT PLACEMENT ;  Surgeon: [name], MD;  Location: [facility];  Service: Urology;  Laterality: Bilateral;    CYSTOSCOPY/URETEROSCOPY/HOLMIUM LASER/STENT PLACEMENT Bilateral 05/27/2019   Procedure: CYSTOSCOPY, URETEROSCOPY/ RETROGRADE PYELOGRAM/ HOLMIUM LASER/STENT EXCHANGE;  Surgeon: [name], MD;  Location: [facility];  Service: Urology;  Laterality: Bilateral;   LYMPH NODE BIOPSY Left 04/02/2016   Procedure: INCIDENTAL LEFT MAMMARY LYMPH NODE BIOPSY;  Surgeon: [name], MD;  Location: [place];  Service: Open Heart Surgery;  Laterality: Left;   mass abdomen  1955   NEPHROLITHOTOMY Left 04/24/2019   Procedure: NEPHROLITHOTOMY PERCUTANEOUS WITH SURGEON ACCESS;  Surgeon: [name], MD;  Location: [facility];  Service: Urology;  Laterality: Left;   TEE WITHOUT CARDIOVERSION N/A 04/02/2016   Procedure: TRANSESOPHAGEAL ECHOCARDIOGRAM (TEE);  Surgeon: [name], MD;  Location: [place];  Service: Open Heart Surgery;  Laterality: N/A;   Family History  Problem Relation Age of Onset   Colon cancer Father 70   Cancer Father    Alcohol abuse Father    Stroke Mother    Diabetes Mother    Hypertension Mother    Hyperlipidemia Mother    Obesity Mother    Heart disease Maternal Aunt    Diabetes Maternal Uncle    Stomach cancer Neg Hx    Esophageal cancer Neg Hx  Rectal cancer Neg Hx    Social History   Socioeconomic History   Marital status: Married    Spouse name: Hassan Rowan   Number of children: Y   Years of education: Not on file   Highest education level: Not on file  Occupational History   Not on file  Tobacco Use   Smoking status: Former    Packs/day: 1.00    Years: 47.00    Pack years: 47.00    Types: Cigarettes    Quit date: 06/25/2018    Years since quitting: 3.1    Passive exposure: Past   Smokeless tobacco: Never  Vaping Use   Vaping Use: Never used  Substance and Sexual Activity   Alcohol use: Not Currently    Comment: occaional shot of vodka   Drug use: Yes    Frequency: 7.0 times per week    Types: Marijuana    Comment: marijuana-states still smoking  10/19/2020   Sexual activity: Not on file  Other Topics Concern   Not on file  Social History Narrative   Not on file   Social Determinants of Health   Financial Resource Strain: Low Risk    Difficulty of Paying Living Expenses: Not hard at all  Food Insecurity: No Food Insecurity   Worried About Charity fundraiser in the Last Year: Never true   [place] in the Last Year: Never true  Transportation Needs: No Transportation Needs   Lack of Transportation (Medical): No   Lack of Transportation (Non-Medical): No  Physical Activity: Inactive   Days of Exercise per Week: 0 days   Minutes of Exercise per Session: 0 min  Stress: No Stress Concern Present   Feeling of Stress : Not at all  Social Connections: Moderately Integrated   Frequency of Communication with Friends and Family: More than three times a week   Frequency of Social Gatherings with Friends and Family: More than three times a week   Attends Religious Services: 1 to 4 times per year   Active Member of [employer] or Organizations: No   Attends Music therapist: Never   Marital Status: Married    Tobacco Counseling Counseling given: Not Answered   Clinical Intake:  Pre-visit preparation completed: Yes  Pain : 0-10 Pain Score: 3  Pain Type: Chronic pain Pain Location: Back Pain Orientation: Lower Pain Descriptors / Indicators: Aching Pain Onset: More than a month ago Pain Frequency: Constant     Nutritional Status: BMI > 30  Obese Nutritional Risks: None Diabetes: Yes  How often do you need to have someone help you when you read instructions, pamphlets, or other written materials from your doctor or pharmacy?: 1 - Never What is the last grade level you completed in school?: 11th grade  Diabetic? Yes Nutrition Risk Assessment:  Has the patient had any N/V/D within the last 2 months?  No  Does the patient have any non-healing wounds?  No  Has the patient had any unintentional weight loss  or weight gain?  No   Diabetes:  Is the patient diabetic?  Yes  If diabetic, was a CBG obtained today?  No  Did the patient bring in their glucometer from home?  No  How often do you monitor your CBG's? 2-3 weekly.   Financial Strains and Diabetes Management:  Are you having any financial strains with the device, your supplies or your medication? No .  Does the patient want to be seen by Chronic Care Management  for management of their diabetes?  No  Would the patient like to be referred to a Nutritionist or for Diabetic Management?  No   Diabetic Exams:  Diabetic Eye Exam: Completed 11/22/2020 Diabetic Foot Exam: Overdue, Pt has been advised about the importance in completing this exam. Pt is scheduled for diabetic foot exam on next appointment.   Interpreter Needed?: No  Information entered by :: NAllen LPN   Activities of Daily Living In your present state of health, do you have any difficulty performing the following activities: 08/07/2021 08/31/2020  Hearing? N N  Vision? N N  Difficulty concentrating or making decisions? N N  Walking or climbing stairs? N N  Dressing or bathing? N N  Doing errands, shopping? N N  Preparing Food and eating ? N N  Using the Toilet? N N  In the past six months, have you accidently leaked urine? N N  Do you have problems with loss of bowel control? N N  Managing your Medications? N N  Managing your Finances? N N  Housekeeping or managing your Housekeeping? N N  Some recent data might be hidden    Patient Care Team: [name], MD as PCP - General (Family Medicine) Skeet Latch, MD as PCP - Cardiology (Cardiology) Clark-Burning, Anderson Malta, PA-C (Inactive) (Dermatology)  Indicate any recent Medical Services you may have received from other than [facility] providers in the past year (date may be approximate).     Assessment:   This is a routine wellness examination for David Brandt.  Hearing/Vision screen Vision Screening - Comments::  Regular eye exams, [facility]  Dietary issues and exercise activities discussed: Current Exercise Habits: The patient does not participate in regular exercise at present   Goals Addressed             This Visit's Progress    Patient Stated       08/07/2021, wants to weigh 195-210 pounds       Depression Screen PHQ 2/9 Scores 08/07/2021 08/31/2020 07/25/2020 06/03/2020 12/04/2017 07/10/2016 06/26/2016  PHQ - 2 Score 0 0 0 0 0 0 0  PHQ- 9 Score - 0 7 - - - -    Fall Risk Fall Risk  08/07/2021 08/31/2020 06/03/2020 12/04/2017 07/10/2016  Falls in the past year? 0 0 0 No No  Number falls in past yr: - 0 0 - -  Injury with Fall? - 0 0 - -  Risk for fall due to : Medication side effect Orthopedic patient - - -  Risk for fall due to: Comment - - - - -  Follow up Falls evaluation completed;Education provided;Falls prevention discussed Falls evaluation completed;Falls prevention discussed - - -    FALL RISK PREVENTION PERTAINING TO THE HOME:  Any stairs in or around the home? Yes  If so, are there any without handrails? Yes  Home free of loose throw rugs in walkways, pet beds, electrical cords, etc? Yes  Adequate lighting in your home to reduce risk of falls? Yes   ASSISTIVE DEVICES UTILIZED TO PREVENT FALLS:  Life alert? No  Use of a cane, walker or w/c? No  Grab bars in the bathroom? No  Shower chair or bench in shower? No  Elevated toilet seat or a handicapped toilet? Yes   TIMED UP AND GO:  Was the test performed? No .      Cognitive Function:     6CIT Screen 08/07/2021  What Year? 0 points  What month? 0 points  What  time? 0 points  Count back from 20 0 points  Months in reverse 4 points  Repeat phrase 0 points  Total Score 4    Immunizations Immunization History  Administered Date(s) Administered   Influenza Split 04/06/2015   Influenza, High Dose Seasonal PF 03/06/2018, 02/15/2019, 02/15/2019, 03/23/2021   Influenza,inj,Quad PF,6+ Mos 05/25/2013    Influenza-Unspecified 03/25/2014, 02/23/2020, 03/08/2020   Moderna Sars-Covid-2 Vaccination 08/12/2019, 08/31/2019, 03/08/2020   PFIZER Comirnaty(Gray Top)Covid-19 Tri-Sucrose Vaccine 11/28/2020   PFIZER(Purple Top)SARS-COV-2 Vaccination 02/23/2020, 03/23/2021   Pneumococcal Conjugate-13 04/28/2019   Pneumococcal Polysaccharide-23 04/11/2016   Zoster Recombinat (Shingrix) 01/21/2017, 03/26/2017    TDAP status: Due, Education has been provided regarding the importance of this vaccine. Advised may receive this vaccine at local pharmacy or Health Dept. Aware to provide a copy of the vaccination record if obtained from local pharmacy or Health Dept. Verbalized acceptance and understanding.  Flu Vaccine status: Up to date  Pneumococcal vaccine status: Up to date  Covid-19 vaccine status: Completed vaccines  Qualifies for Shingles Vaccine? Yes   Zostavax completed No   Shingrix Completed?: Yes  Screening Tests Health Maintenance  Topic Date Due   FOOT EXAM  Never done   Hepatitis C Screening  Never done   TETANUS/TDAP  Never done   HEMOGLOBIN A1C  05/25/2021   OPHTHALMOLOGY EXAM  11/22/2021   COLONOSCOPY (Pts 45-30yrs Insurance coverage will need to be confirmed)  02/21/2023   Pneumonia Vaccine 53+ Years old  Completed   INFLUENZA VACCINE  Completed   COVID-19 Vaccine  Completed   Zoster Vaccines- Shingrix  Completed   HPV VACCINES  Aged Out    Health Maintenance  Health Maintenance Due  Topic Date Due   FOOT EXAM  Never done   Hepatitis C Screening  Never done   TETANUS/TDAP  Never done   HEMOGLOBIN A1C  05/25/2021    Colorectal cancer screening: Type of screening: Colonoscopy. Completed 02/20/2018. Repeat every 5 years  Lung Cancer Screening: (Low Dose CT Chest recommended if Age 71-80 years, 30 pack-year currently smoking OR have quit w/in 15years.) does qualify.   Lung Cancer Screening Referral: CT scan 10/20/2020  Additional Screening:  Hepatitis C Screening: does  qualify;   Vision Screening: Recommended annual ophthalmology exams for early detection of glaucoma and other disorders of the eye. Is the patient up to date with their annual eye exam?  Yes  Who is the provider or what is the name of the office in which the patient attends annual eye exams? [place] If pt is not established with a provider, would they like to be referred to a provider to establish care? No .   Dental Screening: Recommended annual dental exams for proper oral hygiene  Community Resource Referral / Chronic Care Management: CRR required this visit?  No   CCM required this visit?  No      Plan:     I have personally reviewed and noted the following in the patients chart:   Medical and social history Use of alcohol, tobacco or illicit drugs  Current medications and supplements including opioid prescriptions. Patient is not currently taking opioid prescriptions. Functional ability and status Nutritional status Physical activity Advanced directives List of other physicians Hospitalizations, surgeries, and ER visits in previous 12 months Vitals Screenings to include cognitive, depression, and falls Referrals and appointments  In addition, I have reviewed and discussed with patient certain preventive protocols, quality metrics, and best practice recommendations. A written personalized care plan for preventive  services as well as general preventive health recommendations were provided to patient.     Kellie Simmering, LPN   3/50/0938   Nurse Notes: none  Due to this being a virtual visit, the after visit summary with patients personalized plan was offered to patient via mail or my-chart Patient would like to access on my-chart

## 2021-08-13 ENCOUNTER — Other Ambulatory Visit: Payer: Self-pay | Admitting: Family Medicine

## 2021-08-13 DIAGNOSIS — F419 Anxiety disorder, unspecified: Secondary | ICD-10-CM

## 2021-08-14 ENCOUNTER — Other Ambulatory Visit: Payer: Self-pay | Admitting: Family Medicine

## 2021-08-14 IMAGING — CT CT CHEST LUNG CANCER SCREENING LOW DOSE W/O CM
2 of 4 series · 15 of 36 positions shown, 18 images · non-contrast
Comparison: 04/05/2015

CLINICAL DATA: Lung cancer screening. Fifty-four pack-year history.
Former asymptomatic smoker.

EXAM:
CT CHEST WITHOUT CONTRAST LOW-DOSE FOR LUNG CANCER SCREENING
TECHNIQUE: Multidetector CT imaging of the chest was performed following the
standard protocol without IV contrast.

[Series 3: lung thins 1.0 · axial · 0.80mm/px · z∈[-288,-18]mm · 12 of 298 slices shown, 15 images]
[im 14/298  mediastinal]
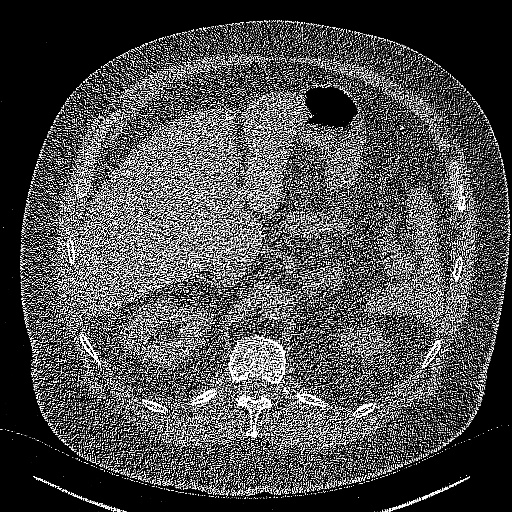
[im 14/298  lung]
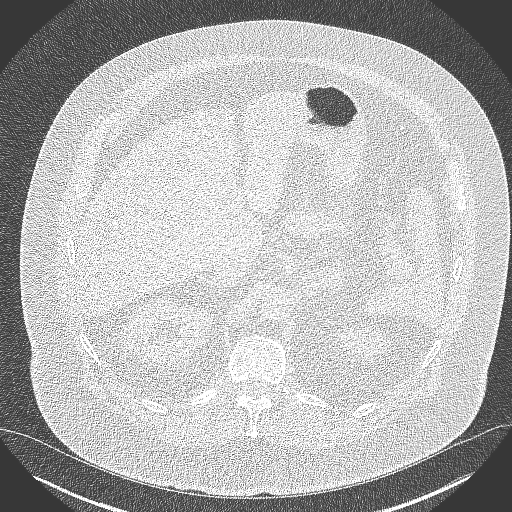
[im 41/298  lung]
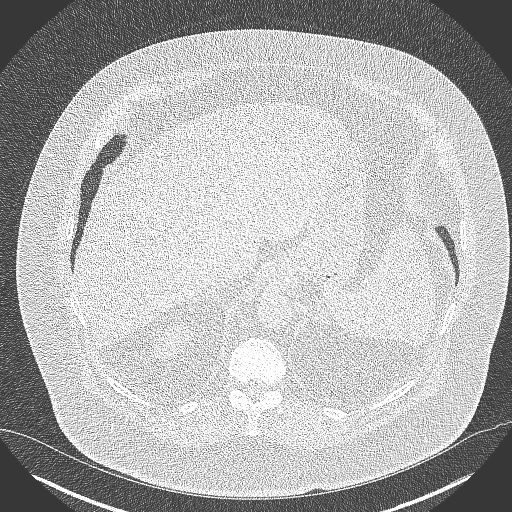
[im 68/298  lung]
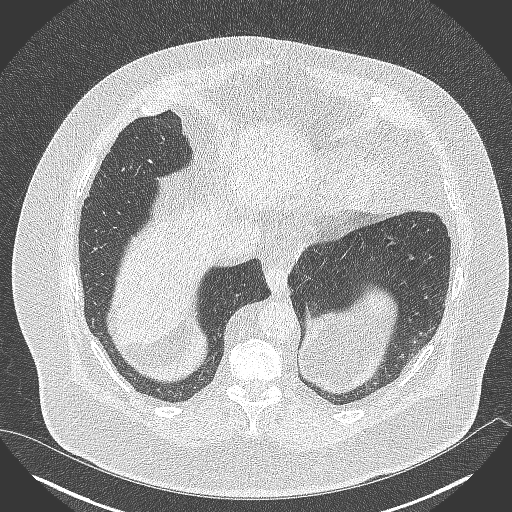
[im 95/298  lung]
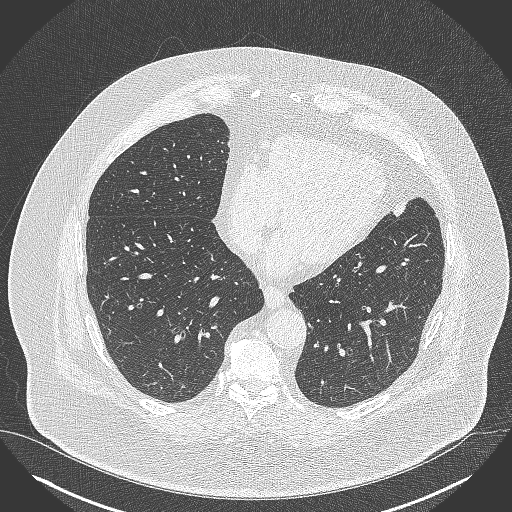
[im 109/298  mediastinal]
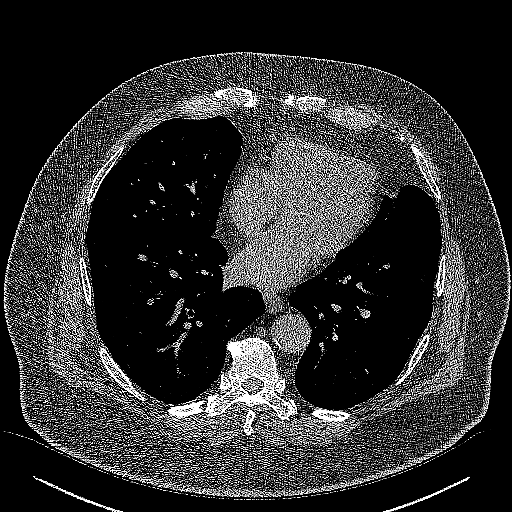
[im 109/298  lung]
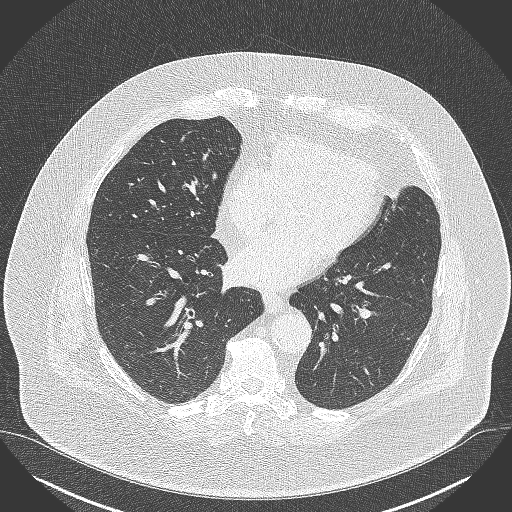
[im 136/298  lung]
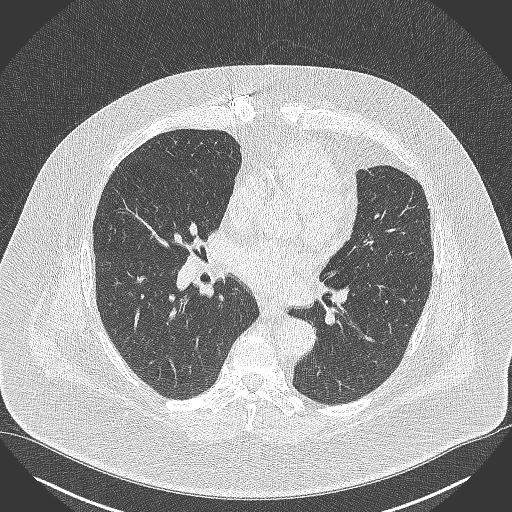
[im 163/298  lung]
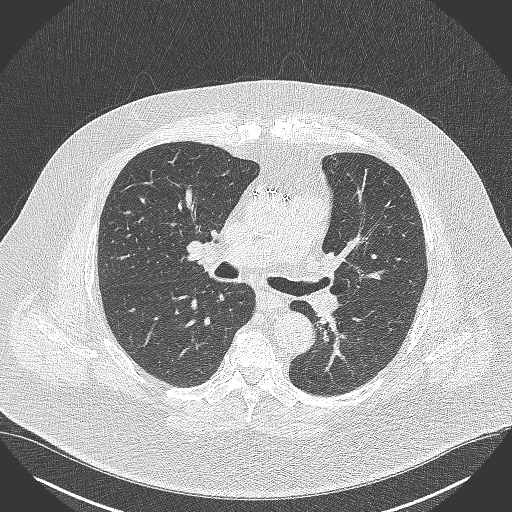
[im 190/298  lung]
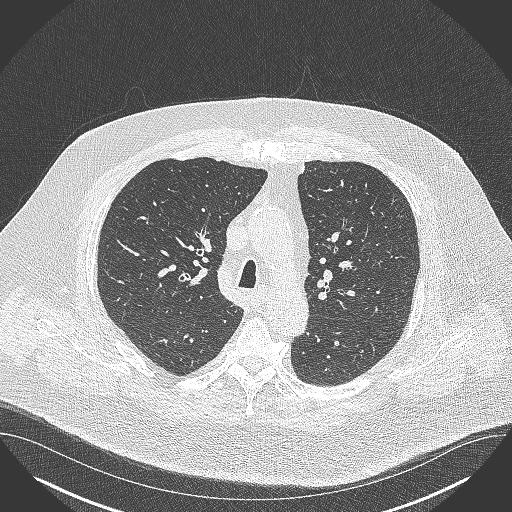
[im 203/298  mediastinal]
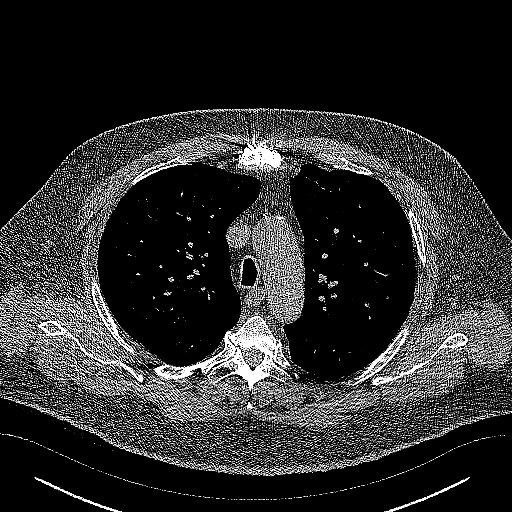
[im 203/298  lung]
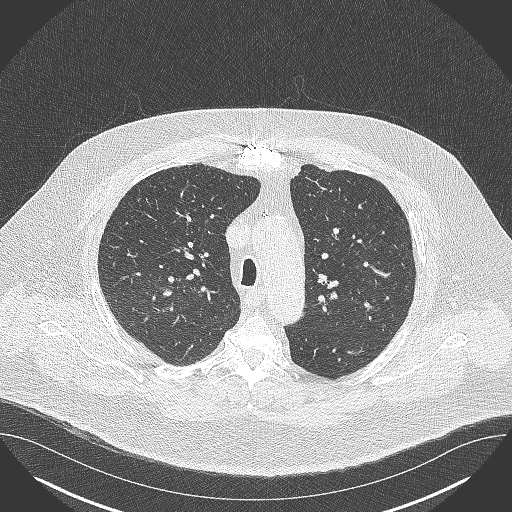
[im 230/298  lung]
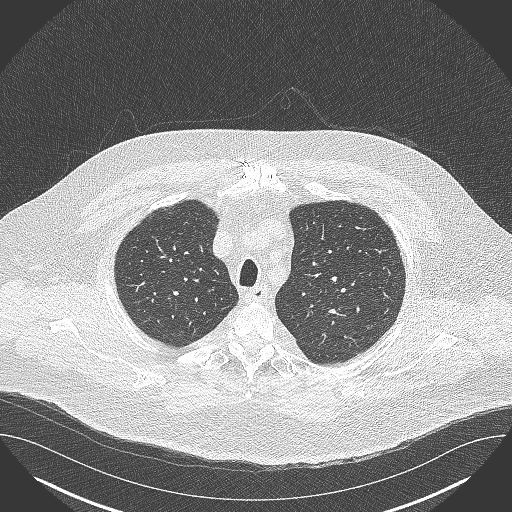
[im 257/298  lung]
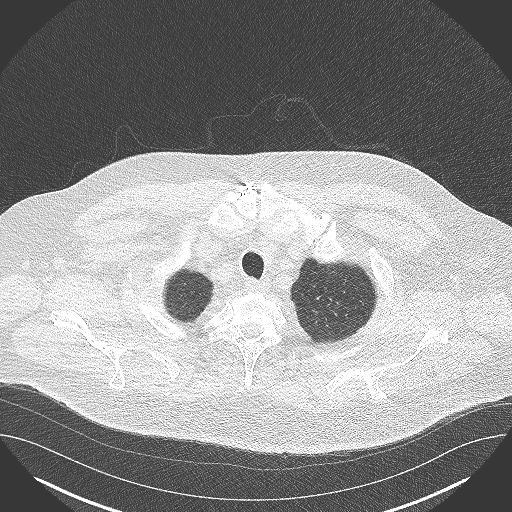
[im 284/298  lung]
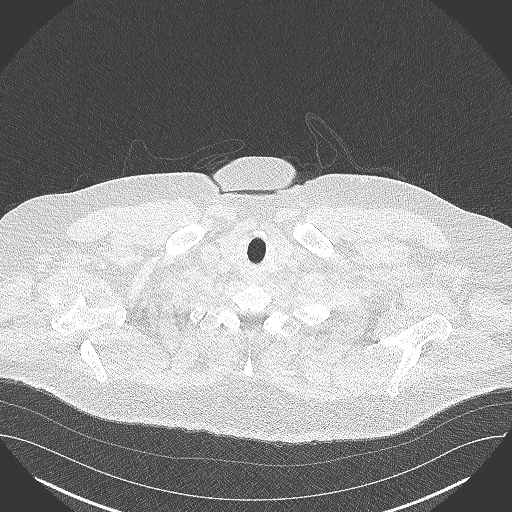

[Series 5: coronal · coronal · 0.59mm/px · 3 of 141 slices shown]
[im 29/141  lung]
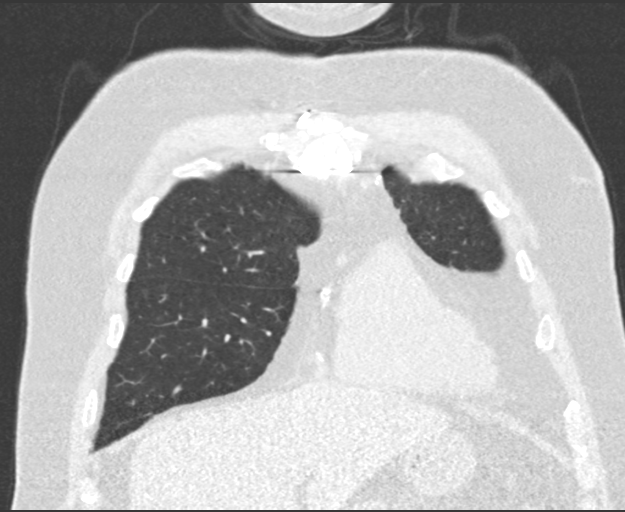
[im 57/141  lung]
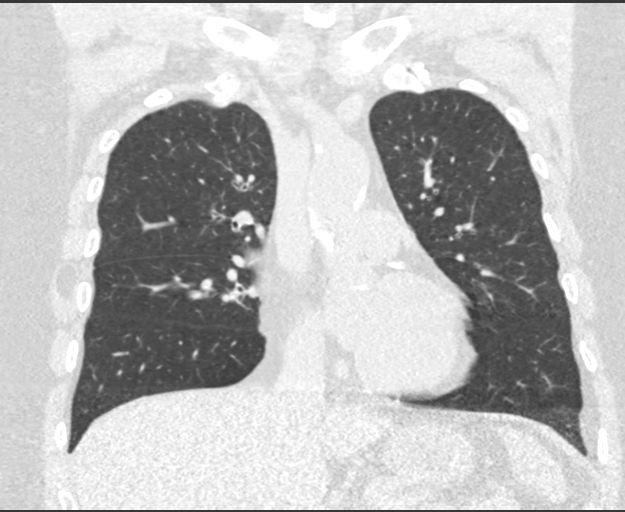
[im 85/141  lung]
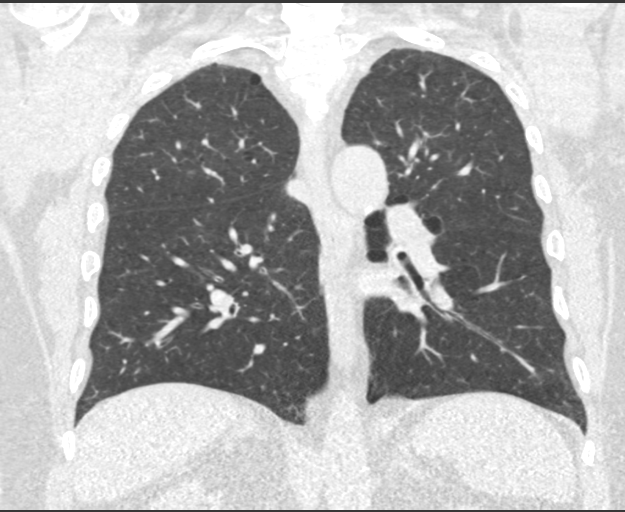

[15 of 36 positions shown; findings below may reference images not displayed]

FINDINGS: Cardiovascular: Normal heart size. No pleural or pericardial
effusion. Aortic atherosclerosis. Previous CABG procedure.

Mediastinum/Nodes: No enlarged mediastinal, hilar, or axillary lymph
nodes. Thyroid gland, trachea, and esophagus demonstrate no
significant findings.

Lungs/Pleura: No pleural effusion. No airspace consolidation,
atelectasis, or pneumothorax. Mild paraseptal and centrilobular
emphysema with diffuse bronchial wall thickening. Single tiny nodule
in the lateral left lung base has an equivalent diameter of 2 mm. No
additional lung nodules.

Upper Abdomen: No acute abnormality.

Musculoskeletal: No chest wall mass or suspicious bone lesions
identified. Previous median sternotomy
IMPRESSION: 1. Lung-RADS 2, benign appearance or behavior. Continue annual
screening with low-dose chest CT without contrast in 12 months.
2. Diffuse bronchial wall thickening with emphysema, as above;
imaging findings suggestive of underlying COPD.
3. Emphysema and aortic atherosclerosis.

Aortic Atherosclerosis (W2Z02-8BX.X) and Emphysema ([id number].I).

## 2021-08-16 ENCOUNTER — Telehealth: Payer: Self-pay | Admitting: Family Medicine

## 2021-08-16 ENCOUNTER — Other Ambulatory Visit: Payer: Self-pay

## 2021-08-16 MED ORDER — GABAPENTIN 300 MG PO CAPS: 300.0000 mg | ORAL_CAPSULE | Freq: Three times a day (TID) | ORAL | 1 refills | Status: DC

## 2021-08-16 NOTE — Telephone Encounter (Signed)
Rx was sent to pt pharmacy as requested

## 2021-08-16 NOTE — Telephone Encounter (Signed)
Patient called in stating that [facility] informed him that Dr.Fry have to give them an OK before they could refill gabapentin (NEURONTIN) 300 MG capsule [[id number]] at the pharmacy.  Please advise.

## 2021-08-18 ENCOUNTER — Ambulatory Visit (INDEPENDENT_AMBULATORY_CARE_PROVIDER_SITE_OTHER): Payer: Medicare PPO | Admitting: Family Medicine

## 2021-08-18 ENCOUNTER — Other Ambulatory Visit: Payer: Self-pay

## 2021-08-18 ENCOUNTER — Encounter: Payer: Self-pay | Admitting: Family Medicine

## 2021-08-18 ENCOUNTER — Ambulatory Visit (INDEPENDENT_AMBULATORY_CARE_PROVIDER_SITE_OTHER): Payer: Medicare PPO

## 2021-08-18 VITALS — BP 110/60 | HR 67 | Temp 98.2°F | Ht 68.0 in | Wt 290.8 lb

## 2021-08-18 DIAGNOSIS — M542 Cervicalgia: Secondary | ICD-10-CM | POA: Diagnosis not present

## 2021-08-18 NOTE — Progress Notes (Signed)
° °  Subjective:    Patient ID: David Brandt, male    DOB: 08-22-1945, 76 y.o.   MRN: [id number]  HPI Here for intermittent pain in the left posterior neck that started about one year ago. He has a hx of scoliosis. No hx of trauma to the neck. He says that when he turns his head to look to his right. He feels a "pop" and a sharp pan starts in the neck. Then when he looks straight ahead the pain eases off. No radiation to the arms. He does not treat it with anything because it is so short lived.    Review of Systems  Constitutional: Negative.   Respiratory: Negative.    Cardiovascular: Negative.   Musculoskeletal:  Positive for neck pain.      Objective:   Physical Exam Constitutional:      Appearance: Normal appearance.  Cardiovascular:     Rate and Rhythm: Normal rate and regular rhythm.     Pulses: Normal pulses.     Heart sounds: Normal heart sounds.  Pulmonary:     Effort: Pulmonary effort is normal.     Breath sounds: Normal breath sounds.  Musculoskeletal:     Comments: He is mildly tender along the left side of the cervical spine. ROM is full, but he feels pain with full rotation to his right   Neurological:     Mental Status: He is alert.          Assessment & Plan:  Neck pain, likely due to a pinched nerve. We will send him for Xrays of the cervical spine today. After we see the report, I anticipate that we will send him for PT.  Alysia Penna, MD

## 2021-08-22 NOTE — Addendum Note (Signed)
Addended by: Alysia Penna A on: 08/22/2021 05:21 PM   Modules accepted: Orders

## 2021-09-04 ENCOUNTER — Inpatient Hospital Stay: Payer: Medicare PPO | Attending: Hematology and Oncology

## 2021-09-04 ENCOUNTER — Inpatient Hospital Stay: Payer: Medicare PPO

## 2021-09-04 ENCOUNTER — Other Ambulatory Visit: Payer: Self-pay

## 2021-09-04 VITALS — BP 94/58 | HR 55 | Temp 98.0°F | Resp 18

## 2021-09-04 DIAGNOSIS — D751 Secondary polycythemia: Secondary | ICD-10-CM | POA: Insufficient documentation

## 2021-09-04 LAB — CBC WITH DIFFERENTIAL/PLATELET
Abs Immature Granulocytes: 0.03 10*3/uL (ref 0.00–0.07)
Basophils Absolute: 0.1 10*3/uL (ref 0.0–0.1)
Basophils Relative: 1 %
Eosinophils Absolute: 0.5 10*3/uL (ref 0.0–0.5)
Eosinophils Relative: 5 %
HCT: 51.8 % (ref 39.0–52.0)
Hemoglobin: 16.6 g/dL (ref 13.0–17.0)
Immature Granulocytes: 0 %
Lymphocytes Relative: 18 %
Lymphs Abs: 1.8 10*3/uL (ref 0.7–4.0)
MCH: 28.8 pg (ref 26.0–34.0)
MCHC: 32 g/dL (ref 30.0–36.0)
MCV: 89.8 fL (ref 80.0–100.0)
Monocytes Absolute: 1.1 10*3/uL — ABNORMAL HIGH (ref 0.1–1.0)
Monocytes Relative: 11 %
Neutro Abs: 6.6 10*3/uL (ref 1.7–7.7)
Neutrophils Relative %: 65 %
Platelets: 175 10*3/uL (ref 150–400)
RBC: 5.77 MIL/uL (ref 4.22–5.81)
RDW: 14.8 % (ref 11.5–15.5)
WBC: 10 10*3/uL (ref 4.0–10.5)
nRBC: 0 % (ref 0.0–0.2)

## 2021-09-04 NOTE — Progress Notes (Signed)
David Brandt presents today for phlebotomy per MD orders, Hct 51.8 ?Phlebotomy procedure started at 0859 and ended at 0902.  16 g kit to L antecubital. ?522 grams removed. ?Patient observed for 30 minutes after procedure without any incident. ?Patient tolerated procedure well. ?IV needle removed intact.  Ginger ale given, VS WNL, patient has no complaints ? ? ?

## 2021-09-11 ENCOUNTER — Other Ambulatory Visit: Payer: Self-pay | Admitting: Cardiovascular Disease

## 2021-09-11 NOTE — Telephone Encounter (Signed)
Rx(s) sent to pharmacy electronically.  

## 2021-09-13 NOTE — Therapy (Signed)
?OUTPATIENT PHYSICAL THERAPY CERVICAL EVALUATION ? ? ?Patient Name: David Brandt ?MRN: [id number] ?DOB:December 30, 1945, 76 y.o., male ?Today's Date: 09/14/2021 ? ? PT End of Session - 09/14/21 1321   ? ? Visit Number 1   ? Date for PT Re-Evaluation 10/12/21   ? Authorization Type [employer], submitting auth for 4 visits   ? Progress Note Due on Visit 10   ? PT Start Time 1230   ? PT Stop Time 1310   ? PT Time Calculation (min) 40 min   ? Activity Tolerance Patient tolerated treatment well   ? Behavior During Therapy [facility] for tasks assessed/performed   ? ?  ?  ? ?  ? ? ?Past Medical History:  ?Diagnosis Date  ? Abnormal cardiovascular stress test 03/29/2016  ? Allergic rhinitis   ? Allergy   ? Arthritis   ? Asthma   ? Back pain   ? Diabetes ([place]) 03/29/2016  ? Gout   ? History of kidney stones   ? Hyperlipidemia   ? Hypertension   ? Joint pain   ? Multiple food allergies   ? Prediabetes   ? Sleep apnea   ? cpap  ? ?Past Surgical History:  ?Procedure Laterality Date  ? CARDIAC CATHETERIZATION N/A 03/29/2016  ? Procedure: Right/Left Heart Cath and Coronary Angiography;  Surgeon: Nelva Bush, MD;  Location: [place] [facility];  Service: Cardiovascular;  Laterality: N/A;  ? CARDIAC CATHETERIZATION N/A 03/29/2016  ? Procedure: Intravascular Pressure Wire/FFR Study;  Surgeon: Nelva Bush, MD;  Location: [place] [facility];  Service: Cardiovascular;  Laterality: N/A;  ? CARDIAC CATHETERIZATION Left 04/02/2016  ? Procedure: FEMORAL ARTERIAL LINE INSERTION;  Surgeon: [name], MD;  Location: [place];  Service: Open Heart Surgery;  Laterality: Left;  ? COLONOSCOPY  02/20/2018  ? per Dr. Carlean Purl, no polyps, repeat in 5 yrs (previous adenomatous polyps)   ? CORONARY ARTERY BYPASS GRAFT N/A 04/02/2016  ? Procedure: CORONARY ARTERY BYPASS GRAFTING (CABG) X 5 UTILIZING LEFT INTERNAL MAMMARY ARTERY AND ENDOSCOPICALLY HARVESTED GREATER SAPHENOUS VEIN ; Mammary to LAD, Sequencial 1st to 2nd Diagonal, Vein to OM, Vein to  Distal RCA.;  Surgeon: [name], MD;  Location: [place];  Service: Open Heart Surgery;  Laterality: N/A;  ? CYSTOSCOPY/URETEROSCOPY/HOLMIUM LASER/STENT PLACEMENT Bilateral 05/07/2019  ? Procedure: LEFT URETEROSCOPY/HOLMIUM LASER/STENT PLACEMENT/BIOPSY LEFT MID URETER/RIGHT RETROGRADE STENT PLACEMENT ;  Surgeon: [name], MD;  [facility];  Service: Urology;  Laterality: Bilateral;  ? CYSTOSCOPY/URETEROSCOPY/HOLMIUM LASER/STENT PLACEMENT Bilateral 05/27/2019  ? Procedure: CYSTOSCOPY, URETEROSCOPY/ RETROGRADE PYELOGRAM/ HOLMIUM LASER/STENT EXCHANGE;  Surgeon: [name], MD;  [facility];  Service: Urology;  Laterality: Bilateral;  ? LYMPH NODE BIOPSY Left 04/02/2016  ? Procedure: INCIDENTAL LEFT MAMMARY LYMPH NODE BIOPSY;  Surgeon: [name], MD;  Location: [place];  Service: Open Heart Surgery;  Laterality: Left;  ? mass abdomen  1955  ? NEPHROLITHOTOMY Left 04/24/2019  ? Procedure: NEPHROLITHOTOMY PERCUTANEOUS WITH SURGEON ACCESS;  Surgeon: [name], MD;  [facility];  Service: Urology;  Laterality: Left;  ? TEE WITHOUT CARDIOVERSION N/A 04/02/2016  ? Procedure: TRANSESOPHAGEAL ECHOCARDIOGRAM (TEE);  Surgeon: [name], MD;  Location: [place];  Service: Open Heart Surgery;  Laterality: N/A;  ? ?Patient Active Problem List  ? Diagnosis Date Noted  ? Anxiety disorder 01/17/2021  ? Neuropathy due to type 2 diabetes mellitus ([place]) 11/23/2020  ? Polycythemia, secondary 09/08/2020  ? Urolithiasis 04/24/2019  ? Nephrolithiasis 04/24/2019  ? Hypogonadism  in male 03/24/2019  ? Morbid obesity ([place]) 11/18/2018  ? Glaucoma suspect of both eyes 09/13/2018  ? Dyslipidemia 12/04/2017  ? Type 2 diabetes mellitus with other specified complication ([place]) 70/96/2836  ? 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 COPD, moderate ([place]) 03/31/2016  ? Angina decubitus ([place]) 03/29/2016  ? Essential hypertension 07/15/2015  ? Hx of adenomatous colonic polyps 09/14/2014  ? OSA (obstructive sleep  apnea) 11/28/2010  ? ? ? ?REFERRING PROVIDER: [name], MD ? ?REFERRING DIAG: M54.2 (ICD-10-CM) - Neck pain  ? ?THERAPY DIAG:  ?Cervicalgia ? ?Cramp and spasm ? ?Abnormal posture ? ?ONSET DATE: 4-5 months, slow onset and worsening ? ?SUBJECTIVE:                                                                                                                                                                                                   ? ?SUBJECTIVE STATEMENT: ?Pt reports Lt sided neck pain started 4-5 mos ago.  He watches TV which is positioned off to Rt side so has to have head turned to Rt to see TV.  Has adjusted the TV to turn head less which has helped.   ? ?PERTINENT HISTORY:  ?2017 CABG and cardiac cath ? Current infusions for polycythemia ? ?PAIN:  ?Are you having pain?  ?Yes: NPRS scale: 6-7/10 ?Pain location: Lt neck pain to shoulder ?Pain description: dull and sharp ?Aggravating factors: turning head too fast, watching TV which is off to Rt side of couch Relieving factors: unsure ? ?PRECAUTIONS: None ? ?WEIGHT BEARING RESTRICTIONS No ? ?FALLS:  ?Has patient fallen in last 6 months? No ?Number of falls: 0 ? ?LIVING ENVIRONMENT: ?Lives with: lives with their spouse ?Lives in: House/apartment ? ? ?OCCUPATION: retired ? ?PLOF: Independent ? ?PATIENT GOALS  get rid of neck pain ? ?OBJECTIVE:  ? ?DIAGNOSTIC FINDINGS:  ?Cervical x-rays: Multilevel degenerative changes with disc space narrowing and endplate irregularity and spurring ? ?PATIENT SURVEYS:  ?FOTO 96%, goal 88% (d/c'd due to high intake score) ? ? ?COGNITION: ?Overall cognitive status: Within functional limits for tasks assessed ? ? ?SENSATION: ?WFL ? ?POSTURE:  ?Scoliosis, dowager's hump, decreased thoracic kyphosis, cervical extension hinge at C6/7 ?Lt scapula abducted ? ?PALPATION: ?Lt upper trap spasm present  ? ?CERVICAL ROM:  ? ?Active ROM A/PROM (deg) ?09/14/2021  ?Flexion 35  ?Extension 35  ?Right lateral flexion 30  ?Left lateral  flexion 45  ?Right rotation 70  ?Left rotation 70  ? (Blank rows = not tested) ? ?UE ROM: ?Full, no pain ?UE MMT: ?Grossly 5/5 bil, scapular strength 4/5 bil  ? ? ? ?  TODAY'S TREATMENT:  ?Trigger Point Dry-Needling  ?Treatment instructions: Expect mild to moderate muscle soreness. S/S of pneumothorax if dry needled over a lung field, and to seek immediate medical attention should they occur. Patient verbalized understanding of these instructions and education. ? ?Patient Consent Given: Yes ?Education handout provided: Yes ?Muscles treated: Lt upper trap ?Electrical stimulation performed: No ?Parameters: N/A ?Treatment response/outcome: twitch and elongation of Lt upper trap ? ?Initiated HEP ?Discussion of need for neutral neck posture and change of position/muscle pumping to break up seated TV time (4-5 hours/day)  ? ? ? ?PATIENT EDUCATION:  ?Education details: Access Code: [email or web address] ?Person educated: Patient ?Education method: Explanation, Demonstration, Verbal cues, and Handouts ?Education comprehension: verbalized understanding and returned demonstration ? ? ?HOME EXERCISE PROGRAM: ? ?Access Code: [email or web address] ?URL: [email or web address] ?Date: 09/14/2021 ?Prepared by: Venetia Night Sedale Jenifer ? ?Exercises ?- Seated Cervical Sidebending Stretch  - 3 x daily - 7 x weekly - 1 sets - 1 reps - 30 hold ?- Seated Cervical Rotation AROM  - 3 x daily - 7 x weekly - 1 sets - 5 reps ?- Seated Backward Shoulder Rolls  - 3 x daily - 7 x weekly - 1 sets - 10 reps ?- Seated Scapular Retraction  - 3 x daily - 7 x weekly - 1 sets - 10 reps ? ? ?ASSESSMENT: ? ?CLINICAL IMPRESSION: ?Patient is a 76 y.o. male who was seen today for physical therapy evaluation and treatment for neck pain.  Pain is Lt sided and worse with watching TV.  His TV is off to Rt side so Pt has head turned to Rt x 4-5 hours daily watching TV.  He presents with abnormal posture, scapular weakness, limited Rt cervical SB, and Lt upper trap spasm.  Good  twitch/release with DN today.  Intiiated HEP and discussed need for neutral posture and muscle pumping/change of position while watching TV.   ? ? ?OBJECTIVE IMPAIRMENTS decreased mobility, decreased ROM, decreased str

## 2021-09-14 ENCOUNTER — Encounter: Payer: Self-pay | Admitting: Physical Therapy

## 2021-09-14 ENCOUNTER — Other Ambulatory Visit: Payer: Self-pay

## 2021-09-14 ENCOUNTER — Ambulatory Visit: Payer: Medicare PPO | Attending: Family Medicine | Admitting: Physical Therapy

## 2021-09-14 DIAGNOSIS — R293 Abnormal posture: Secondary | ICD-10-CM | POA: Insufficient documentation

## 2021-09-14 DIAGNOSIS — M542 Cervicalgia: Secondary | ICD-10-CM | POA: Diagnosis not present

## 2021-09-14 DIAGNOSIS — R252 Cramp and spasm: Secondary | ICD-10-CM | POA: Diagnosis not present

## 2021-09-14 NOTE — Patient Instructions (Signed)

## 2021-09-20 NOTE — Therapy (Signed)
?OUTPATIENT PHYSICAL THERAPY TREATMENT NOTE ? ? ?Patient Name: David Brandt ?MRN: [id number] ?DOB:May 31, 1946, 76 y.o., male ?Today's Date: 09/21/2021 ? ?PCP: [name], MD ?REFERRING PROVIDER: [name], MD ? ? PT End of Session - 09/21/21 0926   ? ? Visit Number 2   ? Number of Visits 5   ? Date for PT Re-Evaluation 10/12/21   ? Authorization Type [employer] Medicare, [place] APPROVED 4 VISITS THROUGH 4/20   ? Authorization - Visit Number 2   ? Authorization - Number of Visits 5   ? Progress Note Due on Visit 10   ? PT Start Time [phone]   ? PT Stop Time 1012   ? PT Time Calculation (min) 44 min   ? Activity Tolerance Patient tolerated treatment well   ? Behavior During Therapy [facility] for tasks assessed/performed   ? ?  ?  ? ?  ? ? ?Past Medical History:  ?Diagnosis Date  ? Abnormal cardiovascular stress test 03/29/2016  ? Allergic rhinitis   ? Allergy   ? Arthritis   ? Asthma   ? Back pain   ? Diabetes ([place]) 03/29/2016  ? Gout   ? History of kidney stones   ? Hyperlipidemia   ? Hypertension   ? Joint pain   ? Multiple food allergies   ? Prediabetes   ? Sleep apnea   ? cpap  ? ?Past Surgical History:  ?Procedure Laterality Date  ? CARDIAC CATHETERIZATION N/A 03/29/2016  ? Procedure: Right/Left Heart Cath and Coronary Angiography;  Surgeon: Nelva Bush, MD;  Location: [place] [facility];  Service: Cardiovascular;  Laterality: N/A;  ? CARDIAC CATHETERIZATION N/A 03/29/2016  ? Procedure: Intravascular Pressure Wire/FFR Study;  Surgeon: Nelva Bush, MD;  Location: [place] [facility];  Service: Cardiovascular;  Laterality: N/A;  ? CARDIAC CATHETERIZATION Left 04/02/2016  ? Procedure: FEMORAL ARTERIAL LINE INSERTION;  Surgeon: [name], MD;  Location: [place];  Service: Open Heart Surgery;  Laterality: Left;  ? COLONOSCOPY  02/20/2018  ? per Dr. Carlean Purl, no polyps, repeat in 5 yrs (previous adenomatous polyps)   ? CORONARY ARTERY BYPASS GRAFT N/A 04/02/2016  ? Procedure: CORONARY ARTERY BYPASS GRAFTING (CABG)  X 5 UTILIZING LEFT INTERNAL MAMMARY ARTERY AND ENDOSCOPICALLY HARVESTED GREATER SAPHENOUS VEIN ; Mammary to LAD, Sequencial 1st to 2nd Diagonal, Vein to OM, Vein to Distal RCA.;  Surgeon: [name], MD;  Location: [place];  Service: Open Heart Surgery;  Laterality: N/A;  ? CYSTOSCOPY/URETEROSCOPY/HOLMIUM LASER/STENT PLACEMENT Bilateral 05/07/2019  ? Procedure: LEFT URETEROSCOPY/HOLMIUM LASER/STENT PLACEMENT/BIOPSY LEFT MID URETER/RIGHT RETROGRADE STENT PLACEMENT ;  Surgeon: [name], MD;  [facility];  Service: Urology;  Laterality: Bilateral;  ? CYSTOSCOPY/URETEROSCOPY/HOLMIUM LASER/STENT PLACEMENT Bilateral 05/27/2019  ? Procedure: CYSTOSCOPY, URETEROSCOPY/ RETROGRADE PYELOGRAM/ HOLMIUM LASER/STENT EXCHANGE;  Surgeon: [name], MD;  [facility];  Service: Urology;  Laterality: Bilateral;  ? LYMPH NODE BIOPSY Left 04/02/2016  ? Procedure: INCIDENTAL LEFT MAMMARY LYMPH NODE BIOPSY;  Surgeon: [name], MD;  Location: [place];  Service: Open Heart Surgery;  Laterality: Left;  ? mass abdomen  1955  ? NEPHROLITHOTOMY Left 04/24/2019  ? Procedure: NEPHROLITHOTOMY PERCUTANEOUS WITH SURGEON ACCESS;  Surgeon: [name], MD;  [facility];  Service: Urology;  Laterality: Left;  ? TEE WITHOUT CARDIOVERSION N/A 04/02/2016  ? Procedure: TRANSESOPHAGEAL ECHOCARDIOGRAM (TEE);  Surgeon: [name], MD;  Location: [place];  Service: Open Heart Surgery;  Laterality: N/A;  ? ?Patient Active Problem List  ?  Diagnosis Date Noted  ? Anxiety disorder 01/17/2021  ? Neuropathy due to type 2 diabetes mellitus ([place]) 11/23/2020  ? Polycythemia, secondary 09/08/2020  ? Urolithiasis 04/24/2019  ? Nephrolithiasis 04/24/2019  ? Hypogonadism in male 03/24/2019  ? Morbid obesity ([place]) 11/18/2018  ? Glaucoma suspect of both eyes 09/13/2018  ? Dyslipidemia 12/04/2017  ? Type 2 diabetes mellitus with other specified complication ([place]) 95/18/8416  ? 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 COPD, moderate  ([place]) 03/31/2016  ? Angina decubitus ([place]) 03/29/2016  ? Essential hypertension 07/15/2015  ? Hx of adenomatous colonic polyps 09/14/2014  ? OSA (obstructive sleep apnea) 11/28/2010  ? ? ?REFERRING DIAG: M54.2 (ICD-10-CM) - Neck pain  ? ?THERAPY DIAG:  ?Cervicalgia ? ?Cramp and spasm ? ?Abnormal posture ? ?PERTINENT HISTORY:  ?2017 CABG and cardiac cath ?Current infusions for polycythemia ? ?PRECAUTIONS: none ? ?SUBJECTIVE: I feel 100% better since the DN at the first visit.  The exercises are helping too.  I have just a little remaining soreness from where the needle went in. ? ?PAIN:  ? ?Are you having pain? No ?NPRS scale: 0/10 ?Pain location: Lt upper trap ?Pain orientation: Left  ?PAIN TYPE: sharp and tight ?Pain description: intermittent  ?Aggravating factors: moving head too fast, watching TV off to Rt side ?Relieving factors: now, HEP and DN ? ? ? ?OBJECTIVE:  ?  ?DIAGNOSTIC FINDINGS:  ?Cervical x-rays: Multilevel degenerative changes with disc space narrowing and endplate irregularity and spurring ?  ?PATIENT SURVEYS:  ?FOTO 96%, goal 88% (d/c'd due to high intake score) ?  ?  ?COGNITION: ?Overall cognitive status: Within functional limits for tasks assessed ?  ?  ?SENSATION: ?WFL ?  ?POSTURE:  ?Scoliosis, dowager's hump, decreased thoracic kyphosis, cervical extension hinge at C6/7 ?Lt scapula abducted ?  ?PALPATION: ?Lt upper trap spasm present          ?  ?CERVICAL ROM:  ?  ?Active ROM A/PROM (deg) ?09/14/2021  ?Flexion 35  ?Extension 35  ?Right lateral flexion 30  ?Left lateral flexion 45  ?Right rotation 70  ?Left rotation 70  ? (Blank rows = not tested) ?  ?UE ROM: ?Full, no pain ?UE MMT: ?Grossly 5/5 bil, scapular strength 4/5 bil  ?  ?  ?  ?TODAY'S TREATMENT:  ?09/21/21: ?Seated neck retraction 10x5" holds ?Seated backward shoulder rolls in good neck posture x 10 ?Seated cervical isometrics bil SB, Rot and retraction with towel 5x10" holds each ?Supine green tband 1x10 each: horiz abd, bil ER (to  intro exercise, then performed in standing ?Standing green tband 1x10 each: horiz abd, bil ER, chest height row (added to HEP) ?Seated Lt upper trap stretch holding bottom of chair with overpressure 2x20" ?Trigger Point Dry-Needling  ?Treatment instructions: Expect mild to moderate muscle soreness. S/S of pneumothorax if dry needled over a lung field, and to seek immediate medical attention should they occur. Patient verbalized understanding of these instructions and education. ? ?Patient Consent Given: Yes ?Education handout provided: Previously provided ?Muscles treated: Lt upper trap ?Electrical stimulation performed: No ?Parameters: N/A ?Treatment response/outcome: twitch/elongation ?STM to Lt upper trap in sitting after DN ? ? ?09/14/21: ?Trigger Point Dry-Needling  ?Treatment instructions: Expect mild to moderate muscle soreness. S/S of pneumothorax if dry needled over a lung field, and to seek immediate medical attention should they occur. Patient verbalized understanding of these instructions and education. ?  ?Patient Consent Given: Yes ?Education handout provided: Yes ?Muscles treated: Lt upper trap ?Electrical stimulation performed: No ?Parameters: N/A ?Treatment  response/outcome: twitch and elongation of Lt upper trap ?  ?Initiated HEP ?Discussion of need for neutral neck posture and change of position/muscle pumping to break up seated TV time (4-5 hours/day)  ?  ?  ?  ?PATIENT EDUCATION:  ?Education details: Access Code: [email or web address] ?Person educated: Patient ?Education method: Explanation, Demonstration, Verbal cues, and Handouts ?Education comprehension: verbalized understanding and returned demonstration ?  ?  ?HOME EXERCISE PROGRAM: ?  ?Access Code: [email or web address] ?URL: [email or web address] ?Date: 09/21/2021 ?Prepared by: Venetia Night Emeric Novinger ? ?Exercises ?- Seated Cervical Sidebending Stretch  - 3 x daily - 7 x weekly - 1 sets - 1 reps - 30 hold ?- Seated Cervical Rotation AROM  - 3 x daily - 7 x  weekly - 1 sets - 5 reps ?- Seated Backward Shoulder Rolls  - 3 x daily - 7 x weekly - 1 sets - 10 reps ?- Seated Scapular Retraction  - 3 x daily - 7 x weekly - 1 sets - 10 reps ?- Cervical Retraction with

## 2021-09-21 ENCOUNTER — Encounter: Payer: Self-pay | Admitting: Physical Therapy

## 2021-09-21 ENCOUNTER — Ambulatory Visit: Payer: Medicare PPO | Admitting: Physical Therapy

## 2021-09-21 DIAGNOSIS — R252 Cramp and spasm: Secondary | ICD-10-CM

## 2021-09-21 DIAGNOSIS — M542 Cervicalgia: Secondary | ICD-10-CM

## 2021-09-21 DIAGNOSIS — R293 Abnormal posture: Secondary | ICD-10-CM | POA: Diagnosis not present

## 2021-09-22 ENCOUNTER — Telehealth: Payer: Self-pay | Admitting: Adult Health

## 2021-09-22 NOTE — Telephone Encounter (Signed)
Rescheduled appointment per provider PAL. Left message. 

## 2021-09-26 ENCOUNTER — Telehealth: Payer: Self-pay | Admitting: Adult Health

## 2021-09-26 NOTE — Telephone Encounter (Signed)
R/s due to infusion schedule, message left with pt about appt at later time ?

## 2021-09-28 ENCOUNTER — Ambulatory Visit: Payer: Medicare PPO | Attending: Family Medicine | Admitting: Physical Therapy

## 2021-09-28 DIAGNOSIS — R293 Abnormal posture: Secondary | ICD-10-CM | POA: Diagnosis not present

## 2021-09-28 DIAGNOSIS — R252 Cramp and spasm: Secondary | ICD-10-CM | POA: Diagnosis not present

## 2021-09-28 DIAGNOSIS — M542 Cervicalgia: Secondary | ICD-10-CM | POA: Insufficient documentation

## 2021-09-28 NOTE — Therapy (Signed)
?OUTPATIENT PHYSICAL THERAPY TREATMENT NOTE/Discharge Summary  ? ? ?Patient Name: David Brandt ?MRN: [id number] ?DOB:1946-01-10, 76 y.o., male ?Today's Date: 09/21/2021 ? ?PCP: [name], MD ?REFERRING PROVIDER: [name], MD ? ? PT End of Session - 09/28/21 1014   ? ? Visit Number 3   ? Number of Visits 5   ? Date for PT Re-Evaluation 10/12/21   ? Authorization Type [employer], [place] APPROVED 4 VISITS THROUGH 4/20   ? Authorization - Visit Number 3   ? Authorization - Number of Visits 5   ? Progress Note Due on Visit 10   ? PT Start Time 1015   ? PT Stop Time 8144 discharge ?  ? PT Time Calculation (min) 23 min   ? Activity Tolerance Patient tolerated treatment well   ? ?  ?  ? ?  ? ? ?Past Medical History:  ?Diagnosis Date  ? Abnormal cardiovascular stress test 03/29/2016  ? Allergic rhinitis   ? Allergy   ? Arthritis   ? Asthma   ? Back pain   ? Diabetes ([place]) 03/29/2016  ? Gout   ? History of kidney stones   ? Hyperlipidemia   ? Hypertension   ? Joint pain   ? Multiple food allergies   ? Prediabetes   ? Sleep apnea   ? cpap  ? ?Past Surgical History:  ?Procedure Laterality Date  ? CARDIAC CATHETERIZATION N/A 03/29/2016  ? Procedure: Right/Left Heart Cath and Coronary Angiography;  Surgeon: Nelva Bush, MD;  Location: [facility];  Service: Cardiovascular;  Laterality: N/A;  ? CARDIAC CATHETERIZATION N/A 03/29/2016  ? Procedure: Intravascular Pressure Wire/FFR Study;  Surgeon: Nelva Bush, MD;  Location: [facility];  Service: Cardiovascular;  Laterality: N/A;  ? CARDIAC CATHETERIZATION Left 04/02/2016  ? Procedure: FEMORAL ARTERIAL LINE INSERTION;  Surgeon: [name], MD;  Location: [facility];  Service: Open Heart Surgery;  Laterality: Left;  ? COLONOSCOPY  02/20/2018  ? per Dr. Carlean Purl, no polyps, repeat in 5 yrs (previous adenomatous polyps)   ? CORONARY ARTERY BYPASS GRAFT N/A 04/02/2016  ? Procedure: CORONARY ARTERY BYPASS GRAFTING (CABG) X 5 UTILIZING LEFT INTERNAL  MAMMARY ARTERY AND ENDOSCOPICALLY HARVESTED GREATER SAPHENOUS VEIN ; Mammary to LAD, Sequencial 1st to 2nd Diagonal, Vein to OM, Vein to Distal RCA.;  Surgeon: [name], MD;  Location: [facility];  Service: Open Heart Surgery;  Laterality: N/A;  ? CYSTOSCOPY/URETEROSCOPY/HOLMIUM LASER/STENT PLACEMENT Bilateral 05/07/2019  ? Procedure: LEFT URETEROSCOPY/HOLMIUM LASER/STENT PLACEMENT/BIOPSY LEFT MID URETER/RIGHT RETROGRADE STENT PLACEMENT ;  Surgeon: [name], MD;  [facility];  Service: Urology;  Laterality: Bilateral;  ? CYSTOSCOPY/URETEROSCOPY/HOLMIUM LASER/STENT PLACEMENT Bilateral 05/27/2019  ? Procedure: CYSTOSCOPY, URETEROSCOPY/ RETROGRADE PYELOGRAM/ HOLMIUM LASER/STENT EXCHANGE;  Surgeon: [name], MD;  [facility];  Service: Urology;  Laterality: Bilateral;  ? LYMPH NODE BIOPSY Left 04/02/2016  ? Procedure: INCIDENTAL LEFT MAMMARY LYMPH NODE BIOPSY;  Surgeon: [name], MD;  Location: [facility];  Service: Open Heart Surgery;  Laterality: Left;  ? mass abdomen  1955  ? NEPHROLITHOTOMY Left 04/24/2019  ? Procedure: NEPHROLITHOTOMY PERCUTANEOUS WITH SURGEON ACCESS;  Surgeon: [name], MD;  [facility];  Service: Urology;  Laterality: Left;  ? TEE WITHOUT CARDIOVERSION N/A 04/02/2016  ? Procedure: TRANSESOPHAGEAL ECHOCARDIOGRAM (TEE);  Surgeon: [name], MD;  Location: [facility];  Service: Open Heart Surgery;  Laterality: N/A;  ? ?Patient Active Problem List  ? Diagnosis Date Noted  ?  Anxiety disorder 01/17/2021  ? Neuropathy due to type 2 diabetes mellitus ([place]) 11/23/2020  ? Polycythemia, secondary 09/08/2020  ? Urolithiasis 04/24/2019  ? Nephrolithiasis 04/24/2019  ? Hypogonadism in male 03/24/2019  ? Morbid obesity ([place]) 11/18/2018  ? Glaucoma suspect of both eyes 09/13/2018  ? Dyslipidemia 12/04/2017  ? Type 2 diabetes mellitus with other specified complication ([place]) 67/34/1937  ? 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 COPD, moderate ([place]) 03/31/2016  ? Angina  decubitus ([place]) 03/29/2016  ? Essential hypertension 07/15/2015  ? Hx of adenomatous colonic polyps 09/14/2014  ? OSA (obstructive sleep apnea) 11/28/2010  ? ? ?REFERRING DIAG: M54.2 (ICD-10-CM) - Neck pain  ? ?THERAPY DIAG:  ?Cervicalgia ? ?Cramp and spasm ? ?Abnormal posture ? ?PERTINENT HISTORY:  ?2017 CABG and cardiac cath ?Current infusions for polycythemia ? ?PRECAUTIONS: none ? ?SUBJECTIVE:  I haven't had pain in 2 weeks.  I do the ex's all the time.  Shoulder rolls, and stretches;   used the bands a few times.   ?PAIN:  ? ?Are you having pain? No ?NPRS scale: 0/10 ?Pain location: Lt upper trap ?Pain orientation: Left  ?PAIN TYPE: sharp and tight ?Pain description: intermittent  ?Aggravating factors: moving head too fast, watching TV off to Rt side ?Relieving factors: now, HEP and DN ? ? ? ?OBJECTIVE:  ?  ?DIAGNOSTIC FINDINGS:  ?Cervical x-rays: Multilevel degenerative changes with disc space narrowing and endplate irregularity and spurring ?  ?PATIENT SURVEYS:  ?FOTO 96%, goal 88% (d/c'd due to high intake score) ?  ?  ?COGNITION: ?Overall cognitive status: Within functional limits for tasks assessed ?  ?  ?SENSATION: ?WFL ?  ?POSTURE:  ?Scoliosis, dowager's hump, decreased thoracic kyphosis, cervical extension hinge at C6/7 ?Lt scapula abducted ?  ?PALPATION: ?Lt upper trap spasm present          ?  ?CERVICAL ROM:  ?  ?Active ROM A/PROM (deg) ?09/14/2021 09/28/21  ?Flexion 35 45  ?Extension 35 70  ?Right lateral flexion 30 53  ?Left lateral flexion 45 58  ?Right rotation 70 70  ?Left rotation 70 70  ? (Blank rows = not tested) ?  ?UE ROM: ?Full, no pain ?UE MMT: ?Grossly 5/5 bil, scapular strength 4+/5 bil  ?  ?  ?  ?TODAY'S TREATMENT:  ? 09/28/21: ?Review of HEP and benefit of continuing to decrease future recurrences ?Discussion of DN and lasting effects ?Self care/awareness of posture alignment (TV /seating arrangement) ?ROM measurement comparison from start of care ?He declines the need for DN today or other  manual therapy ?09/21/21: ?Seated neck retraction 10x5" holds ?Seated backward shoulder rolls in good neck posture x 10 ?Seated cervical isometrics bil SB, Rot and retraction with towel 5x10" holds each ?Supine green tband 1x10 each: horiz abd, bil ER (to intro exercise, then performed in standing ?Standing green tband 1x10 each: horiz abd, bil ER, chest height row (added to HEP) ?Seated Lt upper trap stretch holding bottom of chair with overpressure 2x20" ?Trigger Point Dry-Needling  ?Treatment instructions: Expect mild to moderate muscle soreness. S/S of pneumothorax if dry needled over a lung field, and to seek immediate medical attention should they occur. Patient verbalized understanding of these instructions and education. ? ?Patient Consent Given: Yes ?Education handout provided: Previously provided ?Muscles treated: Lt upper trap ?Electrical stimulation performed: No ?Parameters: N/A ?Treatment response/outcome: twitch/elongation ?STM to Lt upper trap in sitting after DN ? ? ?09/14/21: ?Trigger Point Dry-Needling  ?Treatment instructions: Expect mild to moderate muscle soreness. S/S of pneumothorax  if dry needled over a lung field, and to seek immediate medical attention should they occur. Patient verbalized understanding of these instructions and education. ?  ?Patient Consent Given: Yes ?Education handout provided: Yes ?Muscles treated: Lt upper trap ?Electrical stimulation performed: No ?Parameters: N/A ?Treatment response/outcome: twitch and elongation of Lt upper trap ?  ?Initiated HEP ?Discussion of need for neutral neck posture and change of position/muscle pumping to break up seated TV time (4-5 hours/day)  ?  ?  ?  ?PATIENT EDUCATION:  ?Education details: Access Code: [email or web address] ?Person educated: Patient ?Education method: Explanation, Demonstration, Verbal cues, and Handouts ?Education comprehension: verbalized understanding and returned demonstration ?  ?  ?HOME EXERCISE PROGRAM: ?  ?Access Code:  [email or web address] ?URL: [email or web address] ?Date: 09/21/2021 ?Prepared by: Venetia Night Beuhring ? ?Exercises ?- Seated Cervical Sidebending Stretch  - 3 x daily - 7 x weekly - 1 sets - 1 reps - 30 hold ?-

## 2021-10-02 ENCOUNTER — Other Ambulatory Visit: Payer: Medicare PPO

## 2021-10-02 ENCOUNTER — Inpatient Hospital Stay: Payer: Medicare PPO

## 2021-10-02 ENCOUNTER — Other Ambulatory Visit: Payer: Self-pay

## 2021-10-02 ENCOUNTER — Ambulatory Visit: Payer: Medicare PPO | Admitting: Adult Health

## 2021-10-02 ENCOUNTER — Inpatient Hospital Stay: Payer: Medicare PPO | Attending: Hematology and Oncology

## 2021-10-02 DIAGNOSIS — D751 Secondary polycythemia: Secondary | ICD-10-CM | POA: Insufficient documentation

## 2021-10-02 LAB — CBC WITH DIFFERENTIAL/PLATELET
Abs Immature Granulocytes: 0.03 10*3/uL (ref 0.00–0.07)
Basophils Absolute: 0 10*3/uL (ref 0.0–0.1)
Basophils Relative: 0 %
Eosinophils Absolute: 0.4 10*3/uL (ref 0.0–0.5)
Eosinophils Relative: 4 %
HCT: 48.1 % (ref 39.0–52.0)
Hemoglobin: 15.2 g/dL (ref 13.0–17.0)
Immature Granulocytes: 0 %
Lymphocytes Relative: 19 %
Lymphs Abs: 1.8 10*3/uL (ref 0.7–4.0)
MCH: 29 pg (ref 26.0–34.0)
MCHC: 31.6 g/dL (ref 30.0–36.0)
MCV: 91.6 fL (ref 80.0–100.0)
Monocytes Absolute: 0.8 10*3/uL (ref 0.1–1.0)
Monocytes Relative: 9 %
Neutro Abs: 6.6 10*3/uL (ref 1.7–7.7)
Neutrophils Relative %: 68 %
Platelets: 202 10*3/uL (ref 150–400)
RBC: 5.25 MIL/uL (ref 4.22–5.81)
RDW: 14.3 % (ref 11.5–15.5)
WBC: 9.7 10*3/uL (ref 4.0–10.5)
nRBC: 0 % (ref 0.0–0.2)

## 2021-10-02 NOTE — Progress Notes (Signed)
Pt here for phlebotomy today but HCT was 48.1 & parameter for phlebotomy per Dr Chryl Heck was >51.  Informed pt no phlebotomy today & copied schedule for pt.  Next appt next week.  ?

## 2021-10-04 ENCOUNTER — Ambulatory Visit: Payer: Medicare PPO

## 2021-10-11 ENCOUNTER — Ambulatory Visit: Payer: Medicare PPO

## 2021-10-12 ENCOUNTER — Ambulatory Visit: Payer: Medicare PPO | Admitting: Physical Therapy

## 2021-10-13 ENCOUNTER — Inpatient Hospital Stay: Payer: Medicare PPO | Admitting: Adult Health

## 2021-10-13 ENCOUNTER — Telehealth: Payer: Self-pay

## 2021-10-13 ENCOUNTER — Telehealth: Payer: Self-pay | Admitting: Adult Health

## 2021-10-13 NOTE — Progress Notes (Deleted)
[facility] Follow up:    David Morale, MD [place] [address]   DIAGNOSIS: Secondary polycythemia  SUMMARY OF ONCOLOGIC HISTORY: Oncology History   No history exists.    CURRENT THERAPY:  INTERVAL HISTORY: David Brandt 76 y.o. 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  MEDICAL HISTORY: Past Medical History:  Diagnosis Date   Abnormal cardiovascular stress test 03/29/2016   Allergic rhinitis    Allergy    Arthritis    Asthma    Back pain    Diabetes ([place]) 03/29/2016   Gout    History of kidney stones    Hyperlipidemia    Hypertension    Joint pain    Multiple food allergies    Prediabetes    Sleep apnea    cpap    SURGICAL HISTORY: Past Surgical History:  Procedure Laterality Date   CARDIAC CATHETERIZATION N/A 03/29/2016   Procedure: Right/Left Heart Cath and Coronary Angiography;  Surgeon: Nelva Bush, MD;  Location: [facility];  Service: Cardiovascular;  Laterality: N/A;   CARDIAC CATHETERIZATION N/A 03/29/2016   Procedure: Intravascular Pressure Wire/FFR Study;  Surgeon: Nelva Bush, MD;  Location: [facility];  Service: Cardiovascular;  Laterality: N/A;   CARDIAC CATHETERIZATION Left 04/02/2016   Procedure: FEMORAL ARTERIAL LINE INSERTION;  Surgeon: [name], MD;  Location: [facility];  Service: Open Heart Surgery;  Laterality: Left;   COLONOSCOPY  02/20/2018   per Dr. 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   Procedure: CORONARY ARTERY BYPASS GRAFTING (CABG) X 5 UTILIZING LEFT INTERNAL MAMMARY ARTERY AND ENDOSCOPICALLY HARVESTED GREATER SAPHENOUS VEIN ; Mammary to LAD, Sequencial 1st to 2nd Diagonal, Vein to OM, Vein to Distal RCA.;  Surgeon: [name], MD;  Location: [facility];  Service: Open Heart Surgery;  Laterality: N/A;   CYSTOSCOPY/URETEROSCOPY/HOLMIUM LASER/STENT PLACEMENT Bilateral 05/07/2019   Procedure: LEFT URETEROSCOPY/HOLMIUM LASER/STENT PLACEMENT/BIOPSY LEFT MID URETER/RIGHT RETROGRADE STENT PLACEMENT ;  Surgeon: [name], MD;  [facility];  Service: Urology;  Laterality: Bilateral;   CYSTOSCOPY/URETEROSCOPY/HOLMIUM LASER/STENT PLACEMENT Bilateral 05/27/2019   Procedure: CYSTOSCOPY, URETEROSCOPY/ RETROGRADE PYELOGRAM/ HOLMIUM LASER/STENT EXCHANGE;  Surgeon: [name], MD;  [facility];  Service: Urology;  Laterality: Bilateral;   LYMPH NODE BIOPSY Left 04/02/2016   Procedure: INCIDENTAL LEFT MAMMARY LYMPH NODE BIOPSY;  Surgeon: [name], MD;  Location: [facility];  Service: Open Heart Surgery;  Laterality: Left;   mass abdomen  1955   NEPHROLITHOTOMY Left 04/24/2019   Procedure: NEPHROLITHOTOMY PERCUTANEOUS WITH SURGEON ACCESS;  Surgeon: [name], MD;  [facility];  Service: Urology;  Laterality: Left;  TEE WITHOUT CARDIOVERSION N/A 04/02/2016   Procedure: TRANSESOPHAGEAL ECHOCARDIOGRAM (TEE);  Surgeon: [name], MD;  Location: [facility];  Service: Open Heart Surgery;  Laterality: N/A;    SOCIAL HISTORY: Social History   Socioeconomic  History   Marital status: Married    Spouse name: David Brandt   Number of children: Y   Years of education: Not on file   Highest education level: Not on file  Occupational History   Not on file  Tobacco Use   Smoking status: Former    Packs/day: 1.00    Years: 47.00    Pack years: 47.00    Types: Cigarettes    Quit date: 06/25/2018    Years since quitting: 3.3    Passive exposure: Past   Smokeless tobacco: Never  Vaping Use   Vaping Use: Never used  Substance and Sexual Activity   Alcohol use: Not Currently    Comment: occaional shot of vodka   Drug use: Yes    Frequency: 7.0 times per week    Types: Marijuana    Comment: marijuana-states still smoking 10/19/2020   Sexual activity: Not on file  Other Topics Concern   Not on file  Social History Narrative   Not on file   Social Determinants of Health   Financial Resource Strain: Low Risk    Difficulty of Paying Living Expenses: Not hard at all  Food Insecurity: No Food Insecurity   Worried About Charity fundraiser in the Last Year: Never true   [place] in the Last Year: Never true  Transportation Needs: No Transportation Needs   Lack of Transportation (Medical): No   Lack of Transportation (Non-Medical): No  Physical Activity: Inactive   Days of Exercise per Week: 0 days   Minutes of Exercise per Session: 0 min  Stress: No Stress Concern Present   Feeling of Stress : Not at all  Social Connections: Not on file  Intimate Partner Violence: Not on file    FAMILY HISTORY: Family History  Problem Relation Age of Onset   Colon cancer Father 38   Cancer Father    Alcohol abuse Father    Stroke Mother    Diabetes Mother    Hypertension Mother    Hyperlipidemia Mother    Obesity Mother    Heart disease Maternal Aunt    Diabetes Maternal Uncle    Stomach cancer Neg Hx    Esophageal cancer Neg Hx    Rectal cancer Neg Hx     Review of Systems - Oncology    PHYSICAL EXAMINATION  ECOG PERFORMANCE STATUS:  {CHL ONC ECOG WG:[id number]}  There were no vitals filed for this visit.  Physical Exam  LABORATORY DATA:  CBC    Component Value Date/Time   WBC 9.7 10/02/2021 1304   RBC 5.25 10/02/2021 1304   HGB 15.2 10/02/2021 1304   HGB 20.1 (HH) 07/27/2020 1056   HCT 48.1 10/02/2021 1304   HCT 59.9 (H) 07/27/2020 1056   PLT 202 10/02/2021 1304   PLT 195 07/27/2020 1056   MCV 91.6 10/02/2021 1304   MCV 92 07/27/2020 1056   MCH 29.0 10/02/2021 1304   MCHC 31.6 10/02/2021 1304   RDW 14.3 10/02/2021 1304   RDW 14.7 07/27/2020 1056   LYMPHSABS 1.8 10/02/2021 1304   LYMPHSABS 1.3 07/27/2020 1056   MONOABS 0.8 10/02/2021 1304   EOSABS 0.4 10/02/2021 1304   EOSABS 0.3 07/27/2020 1056   BASOSABS 0.0 10/02/2021 1304   BASOSABS  0.1 07/27/2020 1056    CMP     Component Value Date/Time   NA 133 (L) 09/08/2020 1127   NA 140 07/25/2020 1134   K 4.5 09/08/2020 1127   CL 98 09/08/2020 1127   CO2 28 09/08/2020 1127   GLUCOSE 112 (H) 09/08/2020 1127   BUN 20 09/08/2020 1127   BUN 11 07/25/2020 1134   CREATININE 1.21 09/08/2020 1127   CREATININE 1.03 05/02/2016 0946   CALCIUM 9.6 09/08/2020 1127   PROT 7.4 09/08/2020 1127   PROT 7.0 07/25/2020 1134   ALBUMIN 3.9 09/08/2020 1127   ALBUMIN 4.2 07/25/2020 1134   AST 20 09/08/2020 1127   ALT 24 09/08/2020 1127   ALKPHOS 88 09/08/2020 1127   BILITOT 1.0 09/08/2020 1127   GFRNONAA >60 09/08/2020 1127   GFRAA 75 07/25/2020 1134       PENDING LABS:   RADIOGRAPHIC STUDIES:  No results found.   PATHOLOGY:     ASSESSMENT and THERAPY PLAN:   No problem-specific Assessment & Plan notes found for this encounter.   No orders of the defined types were placed in this encounter.   All questions were answered. The patient knows to call the clinic with any problems, questions or concerns. We can certainly see the patient much sooner if necessary. This note was electronically signed. Scot Dock, NP 10/13/2021

## 2021-10-13 NOTE — Telephone Encounter (Signed)
Per 4/21 in basket called pt and left detailed message about appointments.  Call back number if changes are needed ?

## 2021-10-13 NOTE — Telephone Encounter (Signed)
Attempted to call pt regarding appt with APP today. He [place]/NS to appt for his 3 mo f/u. Message sent to scheduling to get pt r/s. Even though phlebotomy was not indicated for this month, he still needed to come in for 3 mo f/u per MD last note.  ?

## 2021-10-19 ENCOUNTER — Inpatient Hospital Stay: Admission: RE | Admit: 2021-10-19 | Payer: Medicare PPO | Source: Ambulatory Visit

## 2021-10-20 ENCOUNTER — Other Ambulatory Visit: Payer: Self-pay | Admitting: Family Medicine

## 2021-10-20 DIAGNOSIS — I152 Hypertension secondary to endocrine disorders: Secondary | ICD-10-CM

## 2021-10-21 ENCOUNTER — Other Ambulatory Visit: Payer: Self-pay | Admitting: Family Medicine

## 2021-10-27 ENCOUNTER — Inpatient Hospital Stay: Payer: Medicare PPO | Attending: Hematology and Oncology | Admitting: Adult Health

## 2021-10-27 ENCOUNTER — Encounter: Payer: Self-pay | Admitting: Adult Health

## 2021-10-27 DIAGNOSIS — D751 Secondary polycythemia: Secondary | ICD-10-CM | POA: Diagnosis not present

## 2021-10-27 DIAGNOSIS — Z79890 Hormone replacement therapy (postmenopausal): Secondary | ICD-10-CM | POA: Insufficient documentation

## 2021-10-27 NOTE — Progress Notes (Signed)
[facility] Follow up: ?  ? ?[name], MD ?[place] ?[place] [address] ? ?I connected with [name] on 10/27/21 at 10:15 AM EDT by video and verified that I am speaking with the correct person using two identifiers.  ?I discussed the limitations, risks, security and privacy concerns of performing an evaluation and management service by telephone and the availability of in person appointments.  ?I also discussed with the patient that there may be a patient responsible charge related to this service. The patient expressed understanding and agreed to proceed.  ?Patient location: Home ?Provider location: Home in [facility]. ? ?DIAGNOSIS: Secondary Polycythemia ? ?SUMMARY OF HEMATOLOGIC HISTORY: ?#1.  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 ?2.  Monthly CBC and phlebotomy if hematocrit greater than 51--last 06/08/2021 ? ? ?CURRENT THERAPY: intermittent phlebotomy ? ?INTERVAL HISTORY: ?Emeril Stille 76 y.o. male returns for follow up of his polycythemia.  Since his last visit over the past 2 months he has stopped with using testosterone injections.  He has been feeling well since that time.  His most recent labs showed an improvement in his hematocrit of 48.  He has decided to forego receiving testosterone injections to see if this will continue to mean improvement in his elevated hemoglobin. ? ? ?Patient Active Problem List  ? Diagnosis Date Noted  ? Anxiety disorder 01/17/2021  ? 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 Polycythemia, secondary 09/08/2020  ? Urolithiasis 04/24/2019  ? Nephrolithiasis 04/24/2019  ? Hypogonadism in male 03/24/2019  ? Morbid obesity ([place]) 11/18/2018  ? Glaucoma suspect of both eyes 09/13/2018  ? Dyslipidemia 12/04/2017  ? Type 2 diabetes mellitus with other specified complication ([place]) 19/37/9024  ? 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 COPD, moderate ([place]) 03/31/2016  ? Angina  decubitus ([place]) 03/29/2016  ? Essential hypertension 07/15/2015  ? Hx of adenomatous colonic polyps 09/14/2014  ? OSA (obstructive sleep apnea) 11/28/2010  ? ? ?is allergic to allopurinol, justicia adhatoda (malabar nut tree) [justicia adhatoda], valsartan-hydrochlorothiazide, and sulfa antibiotics. ? ?MEDICAL HISTORY: ?Past Medical History:  ?Diagnosis Date  ? Abnormal cardiovascular stress test 03/29/2016  ? Allergic rhinitis   ? Allergy   ? Arthritis   ? Asthma   ? Back pain   ? Diabetes ([place]) 03/29/2016  ? Gout   ? History of kidney stones   ? Hyperlipidemia   ? Hypertension   ? Joint pain   ? Multiple food allergies   ? Prediabetes   ? Sleep apnea   ? cpap  ? ? ?SURGICAL HISTORY: ?Past Surgical History:  ?Procedure Laterality Date  ? CARDIAC CATHETERIZATION N/A 03/29/2016  ? Procedure: Right/Left Heart Cath and Coronary Angiography;  Surgeon: Nelva Bush, MD;  Location: [facility];  Service: Cardiovascular;  Laterality: N/A;  ? CARDIAC CATHETERIZATION N/A 03/29/2016  ? Procedure: Intravascular Pressure Wire/FFR Study;  Surgeon: Nelva Bush, MD;  Location: [facility];  Service: Cardiovascular;  Laterality: N/A;  ? CARDIAC CATHETERIZATION Left 04/02/2016  ? Procedure: FEMORAL ARTERIAL LINE INSERTION;  Surgeon: [name], MD;  Location: [facility];  Service: Open Heart Surgery;  Laterality: Left;  ? COLONOSCOPY  02/20/2018  ? per Dr. Carlean Purl, no polyps, repeat in 5 yrs (previous adenomatous polyps)   ? CORONARY ARTERY BYPASS GRAFT N/A 04/02/2016  ? Procedure: CORONARY ARTERY BYPASS GRAFTING (CABG) X 5 UTILIZING LEFT INTERNAL MAMMARY ARTERY AND ENDOSCOPICALLY HARVESTED GREATER SAPHENOUS VEIN ; Mammary  to LAD, Sequencial 1st to 2nd Diagonal, Vein to OM, Vein to Distal RCA.;  Surgeon: [name], MD;  Location: [facility];  Service: Open Heart Surgery;  Laterality: N/A;  ? CYSTOSCOPY/URETEROSCOPY/HOLMIUM LASER/STENT PLACEMENT Bilateral 05/07/2019  ? Procedure: LEFT URETEROSCOPY/HOLMIUM  LASER/STENT PLACEMENT/BIOPSY LEFT MID URETER/RIGHT RETROGRADE STENT PLACEMENT ;  Surgeon: [name], MD;  Location: [facility];  Service: Urology;  Laterality: Bilateral;  ? CYSTOSCOPY/URETEROSCOPY/HOLMIUM LASER/STENT PLACEMENT Bilateral 05/27/2019  ? Procedure: CYSTOSCOPY, URETEROSCOPY/ RETROGRADE PYELOGRAM/ HOLMIUM LASER/STENT EXCHANGE;  Surgeon: [name], MD;  Location: [facility];  Service: Urology;  Laterality: Bilateral;  ? LYMPH NODE BIOPSY Left 04/02/2016  ? Procedure: INCIDENTAL LEFT MAMMARY LYMPH NODE BIOPSY;  Surgeon: [name], MD;  Location: [facility];  Service: Open Heart Surgery;  Laterality: Left;  ? mass abdomen  1955  ? NEPHROLITHOTOMY Left 04/24/2019  ? Procedure: NEPHROLITHOTOMY PERCUTANEOUS WITH SURGEON ACCESS;  Surgeon: [name], MD;  Location: [facility];  Service: Urology;  Laterality: Left;  ? TEE WITHOUT CARDIOVERSION N/A 04/02/2016  ? Procedure: TRANSESOPHAGEAL ECHOCARDIOGRAM (TEE);  Surgeon: [name], MD;  Location: [facility];  Service: Open Heart Surgery;  Laterality: N/A;  ? ? ?SOCIAL HISTORY: ?Social History  ? ?Socioeconomic History  ? Marital status: Married  ?  Spouse name: Hassan Rowan  ? Number of children: Y  ? Years of education: Not on file  ? Highest education level: Not on file  ?Occupational History  ? Not on file  ?Tobacco Use  ? Smoking status: Former  ?  Packs/day: 1.00  ?  Years: 47.00  ?  Pack years: 47.00  ?  Types: Cigarettes  ?  Quit date: 06/25/2018  ?  Years since quitting: 3.3  ?  Passive exposure: Past  ? Smokeless tobacco: Never  ?Vaping Use  ? Vaping Use: Never used  ?Substance and Sexual Activity  ? Alcohol use: Not Currently  ?  Comment: occaional shot of vodka  ? Drug use: Yes  ?  Frequency: 7.0 times per week  ?  Types: Marijuana  ?  Comment: marijuana-states still smoking 10/19/2020  ? Sexual activity: Not on file  ?Other Topics Concern  ? Not on file  ?Social History Narrative  ? Not on file  ? ?Social Determinants of Health   ? ?Financial Resource Strain: Low Risk   ? Difficulty of Paying Living Expenses: Not hard at all  ?Food Insecurity: No Food Insecurity  ? Worried About Charity fundraiser in the Last Year: Never true  ? Ran Out of Food in the Last Year: Never true  ?Transportation Needs: No Transportation Needs  ? Lack of Transportation (Medical): No  ? Lack of Transportation (Non-Medical): No  ?Physical Activity: Inactive  ? Days of Exercise per Week: 0 days  ? Minutes of Exercise per Session: 0 min  ?Stress: No Stress Concern Present  ? Feeling of Stress : Not at all  ?Social Connections: Not on file  ?Intimate Partner Violence: Not on file  ? ? ?FAMILY HISTORY: ?Family History  ?Problem Relation Age of Onset  ? Colon cancer Father 48  ? Cancer Father   ? Alcohol abuse Father   ? Stroke Mother   ? Diabetes Mother   ? Hypertension Mother   ? Hyperlipidemia Mother   ? Obesity Mother   ? Heart disease Maternal Aunt   ? Diabetes Maternal Uncle   ? Stomach cancer Neg Hx   ? Esophageal cancer Neg Hx   ? Rectal  cancer Neg Hx   ? ? ?Review of Systems  ?Constitutional:  Negative for appetite change, chills, fatigue, fever and unexpected weight change.  ?HENT:   Negative for hearing loss, lump/mass and trouble swallowing.   ?Eyes:  Negative for eye problems and icterus.  ?Respiratory:  Negative for chest tightness, cough and shortness of breath.   ?Cardiovascular:  Negative for chest pain, leg swelling and palpitations.  ?Gastrointestinal:  Negative for abdominal distention, abdominal pain, constipation, diarrhea, nausea and vomiting.  ?Endocrine: Negative for hot flashes.  ?Genitourinary:  Negative for difficulty urinating.   ?Musculoskeletal:  Negative for arthralgias.  ?Skin:  Negative for itching and rash.  ?Neurological:  Negative for dizziness, extremity weakness, headaches and numbness.  ?Hematological:  Negative for adenopathy. Does not bruise/bleed easily.  ?Psychiatric/Behavioral:  Negative for depression. The patient is not  nervous/anxious.    ? ? ?PHYSICAL EXAMINATION ? ?ECOG PERFORMANCE STATUS: 0 - Asymptomatic ?And appeared well he was in no apparent distress.  Mood and behavior were normal.  Skin visualized without rash or lesion. ? ?LABORATORY D

## 2021-10-27 NOTE — Assessment & Plan Note (Signed)
David Brandt's hemoglobin has improved since stopping the testosterone supplementation.  He will continue to stay off this therapy.  He is not having any significant increase in fatigue with stopping.  We will continue to follow his labs monthly and we will see him back for follow-up in 6 months time. ?

## 2021-11-01 ENCOUNTER — Inpatient Hospital Stay: Payer: Medicare PPO

## 2021-11-01 ENCOUNTER — Other Ambulatory Visit: Payer: Self-pay

## 2021-11-01 DIAGNOSIS — D751 Secondary polycythemia: Secondary | ICD-10-CM

## 2021-11-01 DIAGNOSIS — Z79890 Hormone replacement therapy (postmenopausal): Secondary | ICD-10-CM | POA: Diagnosis not present

## 2021-11-01 LAB — CBC WITH DIFFERENTIAL/PLATELET
Abs Immature Granulocytes: 0.02 10*3/uL (ref 0.00–0.07)
Basophils Absolute: 0.1 10*3/uL (ref 0.0–0.1)
Basophils Relative: 1 %
Eosinophils Absolute: 0.3 10*3/uL (ref 0.0–0.5)
Eosinophils Relative: 3 %
HCT: 48.1 % (ref 39.0–52.0)
Hemoglobin: 15.9 g/dL (ref 13.0–17.0)
Immature Granulocytes: 0 %
Lymphocytes Relative: 16 %
Lymphs Abs: 1.3 10*3/uL (ref 0.7–4.0)
MCH: 29.9 pg (ref 26.0–34.0)
MCHC: 33.1 g/dL (ref 30.0–36.0)
MCV: 90.4 fL (ref 80.0–100.0)
Monocytes Absolute: 0.8 10*3/uL (ref 0.1–1.0)
Monocytes Relative: 10 %
Neutro Abs: 5.8 10*3/uL (ref 1.7–7.7)
Neutrophils Relative %: 70 %
Platelets: 186 10*3/uL (ref 150–400)
RBC: 5.32 MIL/uL (ref 4.22–5.81)
RDW: 14.4 % (ref 11.5–15.5)
WBC: 8.3 10*3/uL (ref 4.0–10.5)
nRBC: 0 % (ref 0.0–0.2)

## 2021-11-12 ENCOUNTER — Other Ambulatory Visit: Payer: Self-pay | Admitting: Family Medicine

## 2021-11-12 DIAGNOSIS — F419 Anxiety disorder, unspecified: Secondary | ICD-10-CM

## 2021-11-29 DIAGNOSIS — M48062 Spinal stenosis, lumbar region with neurogenic claudication: Secondary | ICD-10-CM | POA: Diagnosis not present

## 2021-12-01 ENCOUNTER — Inpatient Hospital Stay: Payer: Medicare PPO | Attending: Hematology and Oncology

## 2021-12-01 ENCOUNTER — Other Ambulatory Visit: Payer: Self-pay

## 2021-12-01 DIAGNOSIS — Z79890 Hormone replacement therapy (postmenopausal): Secondary | ICD-10-CM | POA: Diagnosis not present

## 2021-12-01 DIAGNOSIS — D751 Secondary polycythemia: Secondary | ICD-10-CM | POA: Insufficient documentation

## 2021-12-01 LAB — CBC WITH DIFFERENTIAL/PLATELET
Abs Immature Granulocytes: 0.03 10*3/uL (ref 0.00–0.07)
Basophils Absolute: 0.1 10*3/uL (ref 0.0–0.1)
Basophils Relative: 1 %
Eosinophils Absolute: 0.3 10*3/uL (ref 0.0–0.5)
Eosinophils Relative: 4 %
HCT: 47 % (ref 39.0–52.0)
Hemoglobin: 15.3 g/dL (ref 13.0–17.0)
Immature Granulocytes: 0 %
Lymphocytes Relative: 18 %
Lymphs Abs: 1.6 10*3/uL (ref 0.7–4.0)
MCH: 29.4 pg (ref 26.0–34.0)
MCHC: 32.6 g/dL (ref 30.0–36.0)
MCV: 90.4 fL (ref 80.0–100.0)
Monocytes Absolute: 0.7 10*3/uL (ref 0.1–1.0)
Monocytes Relative: 8 %
Neutro Abs: 6 10*3/uL (ref 1.7–7.7)
Neutrophils Relative %: 69 %
Platelets: 192 10*3/uL (ref 150–400)
RBC: 5.2 MIL/uL (ref 4.22–5.81)
RDW: 13.9 % (ref 11.5–15.5)
WBC: 8.6 10*3/uL (ref 4.0–10.5)
nRBC: 0 % (ref 0.0–0.2)

## 2021-12-15 ENCOUNTER — Other Ambulatory Visit: Payer: Self-pay | Admitting: Cardiovascular Disease

## 2021-12-18 ENCOUNTER — Other Ambulatory Visit: Payer: Self-pay | Admitting: General Practice

## 2021-12-28 ENCOUNTER — Other Ambulatory Visit: Payer: Self-pay | Admitting: Family Medicine

## 2022-01-01 ENCOUNTER — Inpatient Hospital Stay: Payer: Medicare PPO | Attending: Hematology and Oncology

## 2022-01-02 ENCOUNTER — Telehealth: Payer: Self-pay | Admitting: Family Medicine

## 2022-01-02 MED ORDER — CELECOXIB 200 MG PO CAPS: 200.0000 mg | ORAL_CAPSULE | Freq: Two times a day (BID) | ORAL | 3 refills | Status: DC

## 2022-01-02 NOTE — Telephone Encounter (Signed)
Message complete

## 2022-01-02 NOTE — Telephone Encounter (Signed)
Last refill- 04/14/2020--Historical provider Last OV- 08/18/21  No future OV scheduled.  Pharmacy updated.

## 2022-01-02 NOTE — Telephone Encounter (Signed)
Done

## 2022-01-02 NOTE — Telephone Encounter (Signed)
Chenika from [facility] [phone] is stating that Pt needs new Rx for Celecoxib 200 mg capsules.     Please advise

## 2022-01-08 ENCOUNTER — Other Ambulatory Visit: Payer: Self-pay | Admitting: Cardiovascular Disease

## 2022-01-08 NOTE — Telephone Encounter (Signed)
Rx(s) sent to pharmacy electronically.  

## 2022-01-12 ENCOUNTER — Other Ambulatory Visit: Payer: Self-pay | Admitting: General Practice

## 2022-01-19 ENCOUNTER — Other Ambulatory Visit: Payer: Self-pay | Admitting: Family Medicine

## 2022-01-19 DIAGNOSIS — I152 Hypertension secondary to endocrine disorders: Secondary | ICD-10-CM

## 2022-01-29 DIAGNOSIS — H2513 Age-related nuclear cataract, bilateral: Secondary | ICD-10-CM | POA: Diagnosis not present

## 2022-01-29 DIAGNOSIS — E119 Type 2 diabetes mellitus without complications: Secondary | ICD-10-CM | POA: Diagnosis not present

## 2022-01-29 DIAGNOSIS — H16223 Keratoconjunctivitis sicca, not specified as Sjogren's, bilateral: Secondary | ICD-10-CM | POA: Diagnosis not present

## 2022-01-30 ENCOUNTER — Other Ambulatory Visit: Payer: Self-pay | Admitting: Family Medicine

## 2022-01-31 ENCOUNTER — Encounter (INDEPENDENT_AMBULATORY_CARE_PROVIDER_SITE_OTHER): Payer: Self-pay

## 2022-02-01 ENCOUNTER — Inpatient Hospital Stay: Payer: Medicare PPO | Attending: Hematology and Oncology

## 2022-02-05 DIAGNOSIS — H40003 Preglaucoma, unspecified, bilateral: Secondary | ICD-10-CM | POA: Diagnosis not present

## 2022-02-06 ENCOUNTER — Other Ambulatory Visit: Payer: Self-pay | Admitting: Cardiovascular Disease

## 2022-02-09 ENCOUNTER — Other Ambulatory Visit: Payer: Self-pay

## 2022-02-09 ENCOUNTER — Emergency Department (HOSPITAL_COMMUNITY)
Admission: EM | Admit: 2022-02-09 | Discharge: 2022-02-09 | Disposition: A | Discharge status: Home / Self Care | Payer: Medicare PPO | Attending: Emergency Medicine | Admitting: Emergency Medicine

## 2022-02-09 ENCOUNTER — Emergency Department (HOSPITAL_COMMUNITY): Payer: Medicare PPO

## 2022-02-09 DIAGNOSIS — I10 Essential (primary) hypertension: Secondary | ICD-10-CM | POA: Diagnosis not present

## 2022-02-09 DIAGNOSIS — Z7951 Long term (current) use of inhaled steroids: Secondary | ICD-10-CM | POA: Insufficient documentation

## 2022-02-09 DIAGNOSIS — Z7982 Long term (current) use of aspirin: Secondary | ICD-10-CM | POA: Diagnosis not present

## 2022-02-09 DIAGNOSIS — E119 Type 2 diabetes mellitus without complications: Secondary | ICD-10-CM | POA: Diagnosis not present

## 2022-02-09 DIAGNOSIS — R030 Elevated blood-pressure reading, without diagnosis of hypertension: Secondary | ICD-10-CM

## 2022-02-09 DIAGNOSIS — G8929 Other chronic pain: Secondary | ICD-10-CM | POA: Diagnosis not present

## 2022-02-09 DIAGNOSIS — M5442 Lumbago with sciatica, left side: Secondary | ICD-10-CM | POA: Diagnosis not present

## 2022-02-09 DIAGNOSIS — Z79899 Other long term (current) drug therapy: Secondary | ICD-10-CM | POA: Insufficient documentation

## 2022-02-09 DIAGNOSIS — J449 Chronic obstructive pulmonary disease, unspecified: Secondary | ICD-10-CM | POA: Diagnosis not present

## 2022-02-09 DIAGNOSIS — Z7984 Long term (current) use of oral hypoglycemic drugs: Secondary | ICD-10-CM | POA: Diagnosis not present

## 2022-02-09 DIAGNOSIS — J45909 Unspecified asthma, uncomplicated: Secondary | ICD-10-CM | POA: Insufficient documentation

## 2022-02-09 DIAGNOSIS — M533 Sacrococcygeal disorders, not elsewhere classified: Secondary | ICD-10-CM | POA: Diagnosis not present

## 2022-02-09 DIAGNOSIS — M545 Low back pain, unspecified: Secondary | ICD-10-CM | POA: Diagnosis not present

## 2022-02-09 DIAGNOSIS — M1612 Unilateral primary osteoarthritis, left hip: Secondary | ICD-10-CM | POA: Diagnosis not present

## 2022-02-09 MED ORDER — OXYCODONE HCL 5 MG PO TABS
5.0000 mg | ORAL_TABLET | Freq: Once | ORAL | Status: AC
  Administered 2022-02-09: 5 mg via ORAL
  Filled 2022-02-09: qty 1

## 2022-02-09 MED ORDER — LIDOCAINE 5 % EX PTCH
1.0000 | MEDICATED_PATCH | CUTANEOUS | 0 refills | Status: DC
Start: 2022-02-09 — End: 2023-07-25

## 2022-02-09 MED ORDER — TIZANIDINE HCL 2 MG PO CAPS
2.0000 mg | ORAL_CAPSULE | Freq: Two times a day (BID) | ORAL | 0 refills | Status: AC | PRN
Start: 2022-02-09 — End: 2022-02-19

## 2022-02-09 NOTE — Discharge Instructions (Addendum)
At this time there does not appear to be the presence of an emergent medical condition, however there is always the potential for conditions to change. Please read and follow the below instructions.  Please return to the Emergency Department immediately for any new or worsening symptoms. Please be sure to follow up with your Primary Care Provider within one week regarding your visit today; please call their office to schedule an appointment even if you are feeling better for a follow-up visit. You may use the muscle relaxer Tizanidine as prescribed to help with your symptoms.  Do not drive or operate heavy machinery while taking Tizanidine as it will make you drowsy.  Do not drink alcohol or take other sedating medications while taking Tizanidine as this will worsen side effects. You may use the Lidoderm patch as prescribed to help with your symptoms.  Lidoderm may be expensive so you may speak with your pharmacist about finding over-the-counter medications that work similarly. Please call your back specialist Dr. Patrice Paradise for a follow-up appointment regarding your pain. You received a pain medication today called oxycodone.  This medication will make you drowsy.  Do not drive a car or perform any potentially dangerous activities for the rest of the day. Your blood pressure was slightly elevated in the ER today.  Please take your blood pressure medication as prescribed and have your blood pressure rechecked by your primary care provider within 1 week to ensure improvement.   Please read the additional information packets attached to your discharge summary.  Go to the nearest Emergency Department immediately if: You have fever or chills You develop new bowel or bladder control problems. You have unusual weakness or numbness in your arms or legs. You feel faint. You get a very bad headache. You start to feel mixed up (confused). You feel weak or numb. You feel faint. You have very bad pain in  your: Chest. Belly (abdomen). You vomit more than once. You have trouble breathing. You have any new/concerning or worsening of symptoms.  Do not take your medicine if  develop an itchy rash, swelling in your mouth or lips, or difficulty breathing; call 911 and seek immediate emergency medical attention if this occurs.  You may review your lab tests and imaging results in their entirety on your MyChart account.  Please discuss all results of fully with your primary care provider and other specialist at your follow-up visit.  Note: Portions of this text may have been transcribed using voice recognition software. Every effort was made to ensure accuracy; however, inadvertent computerized transcription errors may still be present.

## 2022-02-09 NOTE — ED Provider Notes (Signed)
[facility] DEPT Provider Note   CSN: [id number] Arrival date & time: 02/09/22  0932     History  Chief Complaint  Patient presents with   Hip Pain    David Brandt is a 76 y.o. male history of chronic back pain with sciatica.  Hypertension, hyperlipidemia, type 2 diabetes, asthma, OSA, COPD, CABG.  Patient presented for left low back pain radiating to his left hip he reports this has been an ongoing issue for several months worse over the last 6 weeks.  He describes pain as a sharp shooting pain that worsens with certain motion improves somewhat with Tylenol and rest.  Patient reports this feels similar to previous episodes of sciatica without abnormal feature.  He denies any associated numbness or tingling.  He denies fall, injury, fever, chills, abdominal pain, dysuria/hematuria, saddle paresthesias, bowel/bladder continence, urinary retention or any additional concerns.  Patient presented with his wife today who provided some additional history and corroborates patient story above. HPI     Home Medications Prior to Admission medications   Medication Sig Start Date End Date Taking? Authorizing Provider  lidocaine (LIDODERM) 5 % Place 1 patch onto the skin daily. Remove & Discard patch within 12 hours or as directed by MD 02/09/22  Yes Nuala Alpha A, PA-C  tizanidine (ZANAFLEX) 2 MG capsule Take 1 capsule (2 mg total) by mouth 2 (two) times daily as needed for up to 10 days for muscle spasms. 02/09/22 02/19/22 Yes Nuala Alpha A, PA-C  albuterol (VENTOLIN HFA) 108 (90 Base) MCG/ACT inhaler Inhale 2 puffs into the lungs every 4 (four) hours as needed for wheezing or shortness of breath. 01/17/21   [name], MD  Alcohol Swabs (DROPSAFE ALCOHOL PREP) 70 % PADS USE AS DIRECTED 05/10/21   [name], MD  Apoaequorin (PREVAGEN PO) Take 20 mg by mouth.    [provider]  aspirin EC 81 MG tablet Take 81 mg by mouth daily.     [provider]  atorvastatin (LIPITOR) 80 MG tablet TAKE 1 TABLET EVERY DAY AT 6 PM 12/15/21   Skeet Latch, MD  carvedilol (COREG) 25 MG tablet Take 1 tablet (25 mg total) by mouth 2 (two) times daily. 12/15/21   Skeet Latch, MD  celecoxib (CELEBREX) 200 MG capsule Take 1 capsule (200 mg total) by mouth 2 (two) times daily. 01/02/22   [name], MD  Cholecalciferol (VITAMIN D3) 25 MCG (1000 UT) CAPS Take 2,000 Units by mouth daily.    [provider]  felodipine (PLENDIL) 10 MG 24 hr tablet TAKE 1 TABLET (10 MG TOTAL) BY MOUTH DAILY. NEED OFFICE VISIT 2ND ATTEMPT. 02/06/22   Skeet Latch, MD  gabapentin (NEURONTIN) 300 MG capsule TAKE 1 CAPSULE BY MOUTH THREE TIMES A DAY 01/30/22   [name], MD  hydrALAZINE (APRESOLINE) 50 MG tablet TAKE 1 TABLET TWICE DAILY 09/11/21   Deberah Pelton, NP  hydrochlorothiazide (HYDRODIURIL) 25 MG tablet TAKE 1 TABLET EVERY DAY (NEED MD APPOINTMENT FOR REFILLS) 01/08/22   Skeet Latch, MD  hydrochlorothiazide (MICROZIDE) 12.5 MG capsule TAKE 1 CAPSULE BY MOUTH EVERY DAY 01/19/22   [name], MD  metFORMIN (GLUCOPHAGE-XR) 500 MG 24 hr tablet TAKE 1 TABLET EVERY DAY 05/23/21   [name], MD  OVER THE COUNTER MEDICATION Take 2 capsules by mouth 2 (two) times daily. OmegaXL    [provider]  POTASSIUM CITRATE PO Take 15 mEq by mouth.    [provider]  sertraline (ZOLOFT) 50 MG tablet TAKE 2 TABLETS BY MOUTH EVERY DAY 11/13/21   [name], MD  spironolactone (ALDACTONE) 25 MG tablet TAKE 1 TABLET EVERY DAY 07/17/21   Skeet Latch, MD  [facility] 160-4.5 MCG/ACT inhaler TAKE 2 PUFFS BY MOUTH TWICE A DAY 04/11/21   [name], MD  testosterone cypionate (DEPOTESTOSTERONE CYPIONATE) 200 MG/ML injection Inject 0.5 mLs (100 mg total) into the skin every 14 (fourteen) days. INJECT 1 ML INTO THE MUSCELE EVERY 14 DAYS 06/12/21   [name], MD  TRUE METRIX BLOOD GLUCOSE TEST test strip TEST  BLOOD SUGAR AS DIRECTED 07/11/21   [name], MD      Allergies    Allopurinol, Lenon Ahmadi (malabar nut tree) [justicia adhatoda], Valsartan-hydrochlorothiazide, and Sulfa antibiotics    Review of Systems   Review of Systems  Constitutional: Negative.  Negative for chills and fever.  Gastrointestinal: Negative.  Negative for abdominal pain.  Genitourinary: Negative.  Negative for dysuria and hematuria.  Musculoskeletal:  Positive for arthralgias and back pain.  Neurological:  Negative for weakness and numbness.    Physical Exam Updated Vital Signs BP (!) 149/76   Pulse 78   Temp (!) 97.5 F (36.4 C) (Oral)   Resp 18   Ht '5\' 8"'$  (1.727 m)   Wt 127.9 kg   SpO2 100%   BMI 42.88 kg/m  Physical Exam Constitutional:      General: He is not in acute distress.    Appearance: Normal appearance. He is well-developed. He is not ill-appearing or diaphoretic.  HENT:     Head: Normocephalic and atraumatic.  Eyes:     General: Vision grossly intact. Gaze aligned appropriately.     Pupils: Pupils are equal, round, and reactive to light.  Neck:     Trachea: Trachea and phonation normal.  Pulmonary:     Effort: Pulmonary effort is normal. No respiratory distress.  Abdominal:     General: There is no distension.     Palpations: Abdomen is soft.     Tenderness: There is no abdominal tenderness. There is no guarding or rebound.  Musculoskeletal:        General: Normal range of motion.     Cervical back: Normal range of motion.     Comments: Low back/left hip: Normal in appearance.  Atraumatic.  No overlying skin changes.  No length discrepancy or rotation. No midline spinal tenderness to palpation.  No crepitus step-off deformity spine.  No SI joint tenderness palpation.  Pelvis stable compression without pain.  Abdomen soft nontender.  Mild tenderness to palpation of the left paralumbar and left gluteal musculature without induration or fluctuance. Full ROM of the lumbar spine  with increased pain with flexion extension and lateral bends.  Full ROM of the left hip without significant pain with internal/external rotation.  Positive left straight leg raise. Sensation intact and equal in all distributions of the bilateral lower extremities.  5/5 strength with all movements of bilateral lower extremities including EHL, dorsi/plantarflexion, knee extension/flexion and hip flexion. Pedal pulses intact and equal.  Compartments soft. DTR 1+ bilateral patella.  No clonus of the feet.  Skin:    General: Skin is warm and dry.  Neurological:     Mental Status: He is alert.     GCS: GCS eye subscore is 4. GCS verbal subscore is 5. GCS motor subscore is 6.     Comments: Speech is clear and goal oriented, follows commands Major Cranial  nerves without deficit, no facial droop Moves extremities without ataxia, coordination intact  Psychiatric:        Behavior: Behavior normal.     ED Results / Procedures / Treatments   Labs (all labs ordered are listed, but only abnormal results are displayed) Labs Reviewed - No data to display  EKG None  Radiology DG Hip Unilat W or Wo Pelvis 2-3 Views Left  Result Date: 02/09/2022 CLINICAL DATA:  Left-sided hip pain and left-sided sciatica with pain radiating down left leg. No injury. EXAM: DG HIP (WITH OR WITHOUT PELVIS) 2-3V LEFT COMPARISON:  None Available. FINDINGS: Minimal symmetric degenerative change of the hips. No evidence of acute fracture or dislocation. Minimal degenerate change of the sacroiliac joints and symphysis pubis joint. Degenerative change of the spine. IMPRESSION: No acute findings. Electronically Signed   By: Marin Olp M.D.   On: 02/09/2022 08:27   DG Lumbar Spine Complete  Result Date: 02/09/2022 CLINICAL DATA:  Left-sided hip pain and back pain radiating down leg. History of sciatica. No injury. EXAM: LUMBAR SPINE - COMPLETE 4+ VIEW COMPARISON:  None Available. FINDINGS: Vertebral body alignment and heights  are normal. There is moderate spondylosis throughout the lumbar spine to include facet arthropathy. Disc space narrowing at all levels of the lumbar spine with mild sparing of the L5-S1 level. No evidence of compression fracture or spondylolisthesis/spondylolysis. Findings suggesting bilateral nephrolithiasis. IMPRESSION: 1. 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 2. Bilateral nephrolithiasis. Electronically Signed   By: Marin Olp M.D.   On: 02/09/2022 08:26    Procedures Procedures    Medications Ordered in ED Medications  oxyCODONE (Oxy IR/ROXICODONE) immediate release tablet 5 mg (5 mg Oral Given 02/09/22 0825)    ED Course/ Medical Decision Making/ A&P Clinical Course as of 02/09/22 0954  Fri Feb 09, 2022  0931 Anti-inflammatories, muscle relaxers, follow-up with patient's back specialist. [BM]  [phone] DG Lumbar Spine Complete I have personally reviewed and interpreted five-view x-ray of the lumbar spine.  Patient has diffuse degenerative disc disease with endplate spurring.  I do not appreciate any obvious acute fracture or traumatic listhesis. [BM]  0941 DG Hip Unilat W or Wo Pelvis 2-3 Views Left I have personally reviewed and interpreted to the x-ray of the right hip with pelvis.  Patient has osteoarthritic changes, I do not appreciate any obvious acute fracture or dislocation. [BM]    Clinical Course User Index [BM] Deliah Boston, PA-C                           Medical Decision Making 76 year old male presented for left low back pain rating down to his left hip which has been ongoing problem for several months worsening over the past 6 weeks.  He denies any fall or injury or infectious symptoms.  He is tender to the left lumbar paraspinal musculature and left gluteal muscles.  He is a positive straight leg raise.  He has a reassuring neurologic examination today with intact and equal sensations and good strength.  Pedal pulses intact and  equal bilaterally.  He denies any hematuria abdominal pain nausea vomiting or additional concerning symptoms.  Overall examination is most suspicious for acute on chronic left low back pain with left radiculopathy.  X-rays today reveal osteoarthritic changes and degenerative disc disease without evidence for acute fracture or dislocation, he has no history of fall or trauma to the area to suggest acute fracture.  There is no indication for CT imaging or MRI at this point.  Low suspicion for cauda equina, AAA, dissection, spinal epidural abscess, myelopathy or other emergent causes of low back pain.  Incidental nephrolithiasis noted on the lumbar spine films he has no hematuria or radiation of pain to suggest kidney stone disease at this point.  Patient reports he follows with Dr. Patrice Paradise for his chronic back pain I encourage patient and his wife to call his office today to schedule a follow-up appointment.  Patient is currently taking Tylenol, gabapentin and Celebrex for his symptoms.  We will add tizanidine as well as Lidoderm patches to help.  Patient is not a good candidate for corticosteroids given his history of diabetes.  Patient is agreeable to care plan above, he has no additional questions or concerns.  We discussed strict ER precautions today.  Incidentally patient's blood pressure slightly elevated today 149/76, patient encouraged to take antihypertensive as prescribed to have blood pressure rechecked by PCP.  No symptoms to suggest hypertensive urgency/emergency.  No indication for blood work or further ER work-up at this time.  Amount and/or Complexity of Data Reviewed Radiology: ordered. Decision-making details documented in ED Course.  Risk Prescription drug management.   At this time there does not appear to be any evidence of an acute emergency medical condition and the patient appears stable for discharge with appropriate outpatient follow up. Diagnosis was discussed with patient who  verbalizes understanding of care plan and is agreeable to discharge. I have discussed return precautions with patient who verbalizes understanding. Patient encouraged to follow-up with their PCP and orthopedist. All questions answered.  Patient seen and evaluated by Dr. Ashok Cordia during this visit who agrees with discharge at this time.  Note: Portions of this report may have been transcribed using voice recognition software. Every effort was made to ensure accuracy; however, inadvertent computerized transcription errors may still be present.         Final Clinical Impression(s) / ED Diagnoses Final diagnoses:  Chronic left-sided low back pain with left-sided sciatica  Elevated blood pressure reading    Rx / DC Orders ED Discharge Orders          Ordered    tizanidine (ZANAFLEX) 2 MG capsule  2 times daily PRN        02/09/22 0947    lidocaine (LIDODERM) 5 %  Every 24 hours        02/09/22 0947              Deliah Boston, PA-C 02/09/22 6599    Lajean Saver, MD 02/09/22 1530

## 2022-02-09 NOTE — ED Triage Notes (Signed)
Patient coming to ED for evaluation of L sided hip pain.  Reports pain radiates down L leg.  Hx of sciatica.  No recent falls or injuries

## 2022-02-13 ENCOUNTER — Other Ambulatory Visit: Payer: Self-pay | Admitting: Orthopaedic Surgery

## 2022-02-13 DIAGNOSIS — M5136 Other intervertebral disc degeneration, lumbar region: Secondary | ICD-10-CM | POA: Diagnosis not present

## 2022-02-13 DIAGNOSIS — M48062 Spinal stenosis, lumbar region with neurogenic claudication: Secondary | ICD-10-CM | POA: Diagnosis not present

## 2022-02-14 ENCOUNTER — Other Ambulatory Visit: Payer: Self-pay | Admitting: Cardiovascular Disease

## 2022-02-25 ENCOUNTER — Ambulatory Visit
Admission: RE | Admit: 2022-02-25 | Discharge: 2022-02-25 | Disposition: A | Discharge status: Home / Self Care | Payer: Medicare PPO | Source: Ambulatory Visit | Attending: Orthopaedic Surgery | Admitting: Orthopaedic Surgery

## 2022-02-25 DIAGNOSIS — M48061 Spinal stenosis, lumbar region without neurogenic claudication: Secondary | ICD-10-CM | POA: Diagnosis not present

## 2022-02-25 DIAGNOSIS — M48062 Spinal stenosis, lumbar region with neurogenic claudication: Secondary | ICD-10-CM

## 2022-02-25 DIAGNOSIS — M545 Low back pain, unspecified: Secondary | ICD-10-CM | POA: Diagnosis not present

## 2022-02-25 DIAGNOSIS — M4316 Spondylolisthesis, lumbar region: Secondary | ICD-10-CM | POA: Diagnosis not present

## 2022-02-27 DIAGNOSIS — M48062 Spinal stenosis, lumbar region with neurogenic claudication: Secondary | ICD-10-CM | POA: Diagnosis not present

## 2022-02-27 DIAGNOSIS — Z6841 Body Mass Index (BMI) 40.0 and over, adult: Secondary | ICD-10-CM | POA: Diagnosis not present

## 2022-03-05 ENCOUNTER — Telehealth: Payer: Self-pay | Admitting: *Deleted

## 2022-03-05 ENCOUNTER — Inpatient Hospital Stay: Payer: Medicare PPO | Attending: Hematology and Oncology

## 2022-03-05 ENCOUNTER — Other Ambulatory Visit: Payer: Self-pay | Admitting: Cardiovascular Disease

## 2022-03-05 ENCOUNTER — Other Ambulatory Visit: Payer: Self-pay

## 2022-03-05 DIAGNOSIS — D751 Secondary polycythemia: Secondary | ICD-10-CM | POA: Insufficient documentation

## 2022-03-05 LAB — CBC WITH DIFFERENTIAL/PLATELET
Abs Immature Granulocytes: 0.01 10*3/uL (ref 0.00–0.07)
Basophils Absolute: 0.1 10*3/uL (ref 0.0–0.1)
Basophils Relative: 1 %
Eosinophils Absolute: 0.6 10*3/uL — ABNORMAL HIGH (ref 0.0–0.5)
Eosinophils Relative: 7 %
HCT: 48.3 % (ref 39.0–52.0)
Hemoglobin: 15.8 g/dL (ref 13.0–17.0)
Immature Granulocytes: 0 %
Lymphocytes Relative: 20 %
Lymphs Abs: 1.7 10*3/uL (ref 0.7–4.0)
MCH: 30 pg (ref 26.0–34.0)
MCHC: 32.7 g/dL (ref 30.0–36.0)
MCV: 91.8 fL (ref 80.0–100.0)
Monocytes Absolute: 1.1 10*3/uL — ABNORMAL HIGH (ref 0.1–1.0)
Monocytes Relative: 12 %
Neutro Abs: 5.1 10*3/uL (ref 1.7–7.7)
Neutrophils Relative %: 60 %
Platelets: 189 10*3/uL (ref 150–400)
RBC: 5.26 MIL/uL (ref 4.22–5.81)
RDW: 14.9 % (ref 11.5–15.5)
WBC: 8.5 10*3/uL (ref 4.0–10.5)
nRBC: 0 % (ref 0.0–0.2)

## 2022-03-05 NOTE — Telephone Encounter (Signed)
This RN spoke with pt per his inquiry about need for phlebotomy- reviewed lab result today of HCT of 48 with recommendation per provider to restart phlebotomies when it is 51.  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  Copy of labs and written date and time of next appt given to him and his wife.  No further needs at this time.

## 2022-03-06 ENCOUNTER — Other Ambulatory Visit: Payer: Self-pay | Admitting: Family Medicine

## 2022-03-07 DIAGNOSIS — M4726 Other spondylosis with radiculopathy, lumbar region: Secondary | ICD-10-CM | POA: Diagnosis not present

## 2022-03-07 DIAGNOSIS — M48062 Spinal stenosis, lumbar region with neurogenic claudication: Secondary | ICD-10-CM | POA: Diagnosis not present

## 2022-03-07 DIAGNOSIS — M5136 Other intervertebral disc degeneration, lumbar region: Secondary | ICD-10-CM | POA: Diagnosis not present

## 2022-03-09 ENCOUNTER — Ambulatory Visit: Payer: Medicare PPO | Admitting: Family Medicine

## 2022-03-11 ENCOUNTER — Other Ambulatory Visit: Payer: Self-pay | Admitting: Cardiovascular Disease

## 2022-03-12 NOTE — Telephone Encounter (Signed)
Please call pt to schedule overdue follow-up appointment with Dr. Oval Linsey or APP for refills. Last seen 11/2020. Thank you!

## 2022-03-12 NOTE — Telephone Encounter (Signed)
Rx(s) sent to pharmacy electronically.  

## 2022-03-14 ENCOUNTER — Ambulatory Visit (INDEPENDENT_AMBULATORY_CARE_PROVIDER_SITE_OTHER): Payer: Medicare PPO | Admitting: Family Medicine

## 2022-03-14 ENCOUNTER — Encounter: Payer: Self-pay | Admitting: Family Medicine

## 2022-03-14 ENCOUNTER — Telehealth (HOSPITAL_BASED_OUTPATIENT_CLINIC_OR_DEPARTMENT_OTHER): Payer: Self-pay | Admitting: *Deleted

## 2022-03-14 VITALS — BP 102/60 | HR 64 | Temp 98.2°F | Wt 278.0 lb

## 2022-03-14 DIAGNOSIS — J449 Chronic obstructive pulmonary disease, unspecified: Secondary | ICD-10-CM | POA: Diagnosis not present

## 2022-03-14 DIAGNOSIS — I10 Essential (primary) hypertension: Secondary | ICD-10-CM | POA: Diagnosis not present

## 2022-03-14 DIAGNOSIS — E1169 Type 2 diabetes mellitus with other specified complication: Secondary | ICD-10-CM | POA: Diagnosis not present

## 2022-03-14 DIAGNOSIS — F419 Anxiety disorder, unspecified: Secondary | ICD-10-CM | POA: Diagnosis not present

## 2022-03-14 DIAGNOSIS — Z951 Presence of aortocoronary bypass graft: Secondary | ICD-10-CM | POA: Diagnosis not present

## 2022-03-14 DIAGNOSIS — E1140 Type 2 diabetes mellitus with diabetic neuropathy, unspecified: Secondary | ICD-10-CM | POA: Diagnosis not present

## 2022-03-14 DIAGNOSIS — D751 Secondary polycythemia: Secondary | ICD-10-CM

## 2022-03-14 NOTE — Telephone Encounter (Signed)
   Pre-operative Risk Assessment    Patient Name: David Brandt  DOB: September 06, 1945 MRN: [id number]      Request for Surgical Clearance    Procedure:   L2-5 Lami  Date of Surgery:  Clearance TBD                                 Surgeon:  Starling Manns, MD Surgeon's Group or Practice Name:  [facility] Phone number:  [phone] Fax number:  [phone]   Type of Clearance Requested:   - Medical    Type of Anesthesia:  General    Additional requests/questions:  Please advise surgeon/provider what medications should be held.  Rocco Pauls   03/14/2022, 4:25 PM

## 2022-03-14 NOTE — Progress Notes (Signed)
   Subjective:    Patient ID: David Brandt, male    DOB: 22-Dec-1945, 76 y.o.   MRN: [id number]  HPI Here for surgical clearance prior to a planned spinal surgery per Dr. Rennis Harding. This will be laminectomies at L2-L5. It has not been scheduled yet. He is seeing his cardiologist, Dr. Skeet Latch, for clearance as well. Other than his back pain, he has felt fine. His BP is stable. No chest pain or SOB. He had a CBC drawn in the Hematology clinic on 03-05-22, and this was normal.    Review of Systems  Constitutional: Negative.   Respiratory: Negative.    Cardiovascular: Negative.   Gastrointestinal: Negative.   Genitourinary: Negative.   Musculoskeletal:  Positive for back pain.       Objective:   Physical Exam Constitutional:      Appearance: He is obese.     Comments: Walks with a cane   Cardiovascular:     Rate and Rhythm: Normal rate and regular rhythm.     Pulses: Normal pulses.     Heart sounds: Normal heart sounds.  Pulmonary:     Effort: Pulmonary effort is normal.     Breath sounds: Normal breath sounds.  Abdominal:     General: Abdomen is flat. Bowel sounds are normal. There is no distension.     Palpations: Abdomen is soft. There is no mass.     Tenderness: There is no abdominal tenderness. There is no guarding or rebound.     Hernia: No hernia is present.  Musculoskeletal:     Right lower leg: No edema.     Left lower leg: No edema.  Neurological:     General: No focal deficit present.     Mental Status: He is alert. Mental status is at baseline.           Assessment & Plan:  He is doing well overall except for the back pain. We will check a BMET and an A1c today. Assuming these are acceptable we will clear him for the surgery. He will also see Dr. Oval Linsey as above. He will stop taking ASA 7 days prior to the surgery.  We spent a total of ( 35  ) minutes reviewing records and discussing these issues.  Alysia Penna, MD

## 2022-03-14 NOTE — Telephone Encounter (Signed)
Left message for patient to call and schedule overdue follow up with Dr. Ohiopyle  or APP (to refill medications)  °

## 2022-03-15 ENCOUNTER — Other Ambulatory Visit (INDEPENDENT_AMBULATORY_CARE_PROVIDER_SITE_OTHER): Payer: Medicare PPO

## 2022-03-15 DIAGNOSIS — E1169 Type 2 diabetes mellitus with other specified complication: Secondary | ICD-10-CM

## 2022-03-15 LAB — BASIC METABOLIC PANEL
BUN: 22 mg/dL (ref 6–23)
CO2: 30 mEq/L (ref 19–32)
Calcium: 9.8 mg/dL (ref 8.4–10.5)
Chloride: 103 mEq/L (ref 96–112)
Creatinine, Ser: 1.33 mg/dL (ref 0.40–1.50)
GFR: 52.66 mL/min — ABNORMAL LOW (ref >60.00)
Glucose, Bld: 92 mg/dL (ref 70–99)
Potassium: 4.5 mEq/L (ref 3.5–5.1)
Sodium: 139 mEq/L (ref 135–145)

## 2022-03-15 NOTE — Telephone Encounter (Signed)
Call placed to pt regarding surgical clearance and the need for an in-office appointment.  Left pt a message to call back and get that scheduled.

## 2022-03-15 NOTE — Telephone Encounter (Signed)
   Name: Paxten Appelt  DOB: Oct 18, 1945  MRN: [id number]  Primary Cardiologist: Skeet Latch, MD  Chart reviewed as part of pre-operative protocol coverage. Because of Jeremian Piercefield's past medical history and time since last visit, he will require a follow-up in-person visit in order to better assess preoperative cardiovascular risk.  Pre-op covering staff: - Please schedule appointment and call patient to inform them. If patient already had an upcoming appointment within acceptable timeframe, please add "pre-op clearance" to the appointment notes so provider is aware. - Please contact requesting surgeon's office via preferred method (i.e, phone, fax) to inform them of need for appointment prior to surgery.  No medication needing to be held.   Elgie Collard, PA-C  03/15/2022, 3:21 PM

## 2022-03-19 LAB — HEMOGLOBIN A1C: Hgb A1c MFr Bld: 6.2 % (ref 4.6–6.5)

## 2022-03-19 NOTE — Telephone Encounter (Signed)
Pt agreeable to plan of care for in office appt for pre op clearance. Pt scheduled to see Laurann Montana, NP at 1:55 on 03/29/22

## 2022-03-20 DIAGNOSIS — M48062 Spinal stenosis, lumbar region with neurogenic claudication: Secondary | ICD-10-CM | POA: Diagnosis not present

## 2022-03-27 NOTE — Progress Notes (Unsigned)
Office Visit    Patient Name: David Brandt Date of Encounter: 03/29/2022  PCP:  [name], MD   [place]  Cardiologist:  Skeet Latch, MD  Advanced Practice Provider:  No care team member to display Electrophysiologist:  None      Chief Complaint    David Brandt is a 76 y.o. male presents today for preoperative clearance for L2-L5 laminectomy.   Past Medical History    Past Medical History:  Diagnosis Date   Abnormal cardiovascular stress test 03/29/2016   Allergic rhinitis    Allergy    Arthritis    Asthma    Back pain    Diabetes ([place]) 03/29/2016   Gout    History of kidney stones    Hyperlipidemia    Hypertension    Joint pain    Multiple food allergies    Prediabetes    Sleep apnea    cpap   Past Surgical History:  Procedure Laterality Date   CARDIAC CATHETERIZATION N/A 03/29/2016   Procedure: Right/Left Heart Cath and Coronary Angiography;  Surgeon: Nelva Bush, MD;  Location: [place] [facility];  Service: Cardiovascular;  Laterality: N/A;   CARDIAC CATHETERIZATION N/A 03/29/2016   Procedure: Intravascular Pressure Wire/FFR Study;  Surgeon: Nelva Bush, MD;  Location: [facility];  Service: Cardiovascular;  Laterality: N/A;   CARDIAC CATHETERIZATION Left 04/02/2016   Procedure: FEMORAL ARTERIAL LINE INSERTION;  Surgeon: [name], MD;  Location: [place];  Service: Open Heart Surgery;  Laterality: Left;   COLONOSCOPY  02/20/2018   per Dr. 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   Procedure: CORONARY ARTERY BYPASS GRAFTING (CABG) X 5 UTILIZING LEFT INTERNAL MAMMARY ARTERY AND ENDOSCOPICALLY HARVESTED GREATER SAPHENOUS VEIN ; Mammary to LAD, Sequencial 1st to 2nd Diagonal, Vein to OM, Vein to Distal RCA.;  Surgeon: [name], MD;  Location: [place];  Service: Open Heart Surgery;  Laterality: N/A;    CYSTOSCOPY/URETEROSCOPY/HOLMIUM LASER/STENT PLACEMENT Bilateral 05/07/2019   Procedure: LEFT URETEROSCOPY/HOLMIUM LASER/STENT PLACEMENT/BIOPSY LEFT MID URETER/RIGHT RETROGRADE STENT PLACEMENT ;  Surgeon: [name], MD;  Location: [facility];  Service: Urology;  Laterality: Bilateral;   CYSTOSCOPY/URETEROSCOPY/HOLMIUM LASER/STENT PLACEMENT Bilateral 05/27/2019   Procedure: CYSTOSCOPY, URETEROSCOPY/ RETROGRADE PYELOGRAM/ HOLMIUM LASER/STENT EXCHANGE;  Surgeon: [name], MD;  Location: [facility];  Service: Urology;  Laterality: Bilateral;   LYMPH NODE BIOPSY Left 04/02/2016   Procedure: INCIDENTAL LEFT MAMMARY LYMPH NODE BIOPSY;  Surgeon: [name], MD;  Location: [place];  Service: Open Heart Surgery;  Laterality: Left;   mass abdomen  1955   NEPHROLITHOTOMY Left 04/24/2019   Procedure: NEPHROLITHOTOMY PERCUTANEOUS WITH SURGEON ACCESS;  Surgeon: [name], MD;  Location: [facility];  Service: Urology;  Laterality: Left;   TEE WITHOUT CARDIOVERSION N/A 04/02/2016   Procedure: TRANSESOPHAGEAL ECHOCARDIOGRAM (TEE);  Surgeon: [name], MD;  Location: [place];  Service: Open Heart Surgery;  Laterality: N/A;    Allergies  Allergies  Allergen Reactions   Allopurinol     Joint pain    Justicia Adhatoda (Malabar Nut Tree) [Justicia Adhatoda]     Allergic to Walnuts, hickory nuts, and almonds   Valsartan-Hydrochlorothiazide     Lip swelling    Sulfa Antibiotics Hives    History of Present Illness    David Brandt is a 77 y.o. male with a hx of CAD s/p CABG 2017, HTN, HLD,  obesity, OSA last seen 11/23/20 by Coletta Memos, NP.  Initial evaluation 2017 chest pressure, shortness of breath. Nuclear stress test 03/2016 EF 48%, large inferior/inferolateral MI with no ischemia. Cardiac cath found to have severe 3-vessel CAD. Underwent CABG by Dr. Servando Snare 04/02/2016. He did have post operative atrial fibrillation on Amiodarone which was later discontinued with no recurrent  atrial fib. Echo 03/30/16 EF 55-60%, otherwise unremarkable. Last seen 11/23/20 doing overall well from a cardiac perspective.   He presents today for follow up independently. Tells me he got an injection last Tuesday and is feeling 200% better in regards to his back pain. Reports no shortness of breath nor dyspnea on exertion. Reports no chest pain, pressure, or tightness. No edema, orthopnea, PND. Reports no palpitations.  He is active playing with his 70 year old grandson and around his home.    EKGs/Labs/Other Studies Reviewed:   The following studies were reviewed today:   EKG:  EKG is  ordered today.  The ekg ordered today demonstrates NSR 47 bpm with no acute ST/T wave changes.   Recent Labs: 03/05/2022: Hemoglobin 15.8; Platelets 189 03/14/2022: BUN 22; Creatinine, Ser 1.33; Potassium 4.5; Sodium 139  Recent Lipid Panel    Component Value Date/Time   CHOL 115 07/25/2020 1134   TRIG 194 (H) 07/25/2020 1134   HDL 39 (L) 07/25/2020 1134   CHOLHDL 3.0 04/14/2020 1100   CHOLHDL 4.1 03/30/2016 0156   VLDL 59 (H) 03/30/2016 0156   LDLCALC 44 07/25/2020 1134     Home Medications   Current Meds  Medication Sig   albuterol (VENTOLIN HFA) 108 (90 Base) MCG/ACT inhaler Inhale 2 puffs into the lungs every 4 (four) hours as needed for wheezing or shortness of breath.   Alcohol Swabs (DROPSAFE ALCOHOL PREP) 70 % PADS USE AS DIRECTED   Apoaequorin (PREVAGEN PO) Take 20 mg by mouth.   aspirin EC 81 MG tablet Take 81 mg by mouth daily.   atorvastatin (LIPITOR) 80 MG tablet TAKE 1 TABLET EVERY DAY AT 6PM (APPOINTMENT IS NEEDED)   carvedilol (COREG) 25 MG tablet TAKE 1 TABLET TWICE DAILY (PLEASE SCHEDULE AN APPT FOR FUTURE REFILLS)   celecoxib (CELEBREX) 200 MG capsule Take 1 capsule (200 mg total) by mouth 2 (two) times daily.   Cholecalciferol (VITAMIN D3) 25 MCG (1000 UT) CAPS Take 2,000 Units by mouth daily.   felodipine (PLENDIL) 10 MG 24 hr tablet TAKE 1 TABLET (10 MG TOTAL) BY MOUTH  DAILY. NEED OFFICE VISIT 2ND ATTEMPT.   gabapentin (NEURONTIN) 300 MG capsule TAKE 1 CAPSULE BY MOUTH THREE TIMES A DAY   hydrALAZINE (APRESOLINE) 50 MG tablet TAKE 1 TABLET TWICE DAILY   hydrochlorothiazide (HYDRODIURIL) 25 MG tablet TAKE 1 TABLET EVERY DAY (NEED MD APPOINTMENT FOR REFILLS)   lidocaine (LIDODERM) 5 % Place 1 patch onto the skin daily. Remove & Discard patch within 12 hours or as directed by MD   metFORMIN (GLUCOPHAGE-XR) 500 MG 24 hr tablet TAKE 1 TABLET EVERY DAY   OVER THE COUNTER MEDICATION Take 2 capsules by mouth 2 (two) times daily. OmegaXL   POTASSIUM CITRATE PO Take 15 mEq by mouth.   sertraline (ZOLOFT) 50 MG tablet TAKE 2 TABLETS BY MOUTH EVERY DAY   spironolactone (ALDACTONE) 25 MG tablet TAKE 1 TABLET EVERY DAY   SYMBICORT 160-4.5 MCG/ACT inhaler TAKE 2 PUFFS BY MOUTH TWICE A DAY   TRUE METRIX BLOOD GLUCOSE TEST test strip TEST BLOOD SUGAR AS DIRECTED     Review of Systems  All other systems reviewed and are otherwise negative except as noted above.  Physical Exam    VS:  BP (!) 91/57   Pulse (!) 47   Ht '5\' 8"'$  (1.727 m)   Wt 281 lb (127.5 kg)   BMI 42.73 kg/m  , BMI Body mass index is 42.73 kg/m.  Wt Readings from Last 3 Encounters:  03/29/22 281 lb (127.5 kg)  03/14/22 278 lb (126.1 kg)  02/09/22 282 lb (127.9 kg)     GEN: Well nourished, overweight, well developed, in no acute distress. HEENT: normal. Neck: Supple, no JVD, carotid bruits, or masses. Cardiac: bradycardic, RRR, no murmurs, rubs, or gallops. No clubbing, cyanosis, edema.  Radials/PT 2+ and equal bilaterally.  Respiratory:  Respirations regular and unlabored, clear to auscultation bilaterally. GI: Soft, nontender, nondistended. MS: No deformity or atrophy. Skin: Warm and dry, no rash. Neuro:  Strength and sensation are intact. Psych: Normal affect.  Assessment & Plan    Preop clearance - Upcoming laminectomy. According to the Revised Cardiac Risk Index (RCRI), his  Perioperative Risk of Major Cardiac Event is (%): 0.9. His Functional Capacity in METs is: 5.62 according to the Duke Activity Status Index (DASI). He is deemed acceptable risk for planned procedure without additional cardiovascular testing. Will route to Dr. Patrice Paradise via [place] fax function. May hold Aspirin one week prior to surgery and resume as soon as safe posoperatively.   CAD s/p CABG - Stable with no anginal symptoms. No indication for ischemic evaluation.  GDMT Aspirin, Atorvastatin, Coreg. Heart healthy diet and regular cardiovascular exercise encouraged.    HTN - Hypotension today though asymptomatic with no lightheadedness nor dizziness. Reduce Coreg to 12.'5mg'$  BID. Send MyChart message in one week to check in - if BP persistently low, plan to reduce Hydralazine.   Bradycardia - Asymptomatic today with HR 47 bpm. Reduce Coreg to 12.'5mg'$  BID. Check in via MyChart one week.   HLD, LDL goal <70 - 06/2020 LDL 44. Continue Atorvastatin '80mg'$ .          Disposition: Follow up in 6 month(s) with Skeet Latch, MD or APP.  Signed, Loel Dubonnet, NP 03/29/2022, 2:21 PM [facility]

## 2022-03-29 ENCOUNTER — Ambulatory Visit (HOSPITAL_BASED_OUTPATIENT_CLINIC_OR_DEPARTMENT_OTHER): Payer: Medicare PPO | Admitting: Family

## 2022-03-29 ENCOUNTER — Encounter (HOSPITAL_BASED_OUTPATIENT_CLINIC_OR_DEPARTMENT_OTHER): Payer: Self-pay | Admitting: Family

## 2022-03-29 VITALS — BP 91/57 | HR 47 | Ht 68.0 in | Wt 281.0 lb

## 2022-03-29 DIAGNOSIS — G4733 Obstructive sleep apnea (adult) (pediatric): Secondary | ICD-10-CM

## 2022-03-29 DIAGNOSIS — I10 Essential (primary) hypertension: Secondary | ICD-10-CM

## 2022-03-29 DIAGNOSIS — E785 Hyperlipidemia, unspecified: Secondary | ICD-10-CM

## 2022-03-29 DIAGNOSIS — Z01818 Encounter for other preprocedural examination: Secondary | ICD-10-CM | POA: Diagnosis not present

## 2022-03-29 MED ORDER — CARVEDILOL 12.5 MG PO TABS: 12.5000 mg | ORAL_TABLET | Freq: Two times a day (BID) | ORAL | 3 refills | Status: DC

## 2022-03-29 NOTE — Patient Instructions (Addendum)
Medication Instructions:  Your physician has recommended you make the following change in your medication:  Change: Carvedilol 12.'5mg'$  twice daily   *If you need a refill on your cardiac medications before your next appointment, please call your pharmacy*  Testing/Procedures: You are cleared for surgery! We will send our note to the surgeon.    Follow-Up: At [facility], you and your health needs are our priority.  As part of our continuing mission to provide you with exceptional heart care, we have created designated Provider Care Teams.  These Care Teams include your primary Cardiologist (physician) and Advanced Practice Providers (APPs -  Physician Assistants and Nurse Practitioners) who all work together to provide you with the care you need, when you need it.  We recommend signing up for the patient portal called "MyChart".  Sign up information is provided on this After Visit Summary.  MyChart is used to connect with patients for Virtual Visits (Telemedicine).  Patients are able to view lab/test results, encounter notes, upcoming appointments, etc.  Non-urgent messages can be sent to your provider as well.   To learn more about what you can do with MyChart, go to [email or web address].    Your next appointment:   6 month(s)  The format for your next appointment:   In Person  Provider:   Skeet Latch, MD or Laurann Montana, NP   Other Instructions Exercise recommendations: The American Heart Association recommends 150 minutes of moderate intensity exercise weekly. Try 30 minutes of moderate intensity exercise 4-5 times per week. This could include walking, jogging, or swimming.  Heart Healthy Diet Recommendations: A low-salt diet is recommended. Meats should be grilled, baked, or boiled. Avoid fried foods. Focus on lean protein sources like fish or chicken with vegetables and fruits. The American Heart Association is a Microbiologist!  American Heart Association  Diet and Lifeystyle Recommendations    Important Information About Sugar

## 2022-04-02 ENCOUNTER — Other Ambulatory Visit: Payer: Self-pay | Admitting: Adult Health

## 2022-04-02 DIAGNOSIS — E785 Hyperlipidemia, unspecified: Secondary | ICD-10-CM

## 2022-04-02 DIAGNOSIS — I10 Essential (primary) hypertension: Secondary | ICD-10-CM

## 2022-04-04 ENCOUNTER — Inpatient Hospital Stay: Payer: Medicare PPO | Attending: Hematology and Oncology

## 2022-04-04 DIAGNOSIS — D751 Secondary polycythemia: Secondary | ICD-10-CM | POA: Insufficient documentation

## 2022-04-04 LAB — CBC WITH DIFFERENTIAL/PLATELET
Abs Immature Granulocytes: 0.04 10*3/uL (ref 0.00–0.07)
Basophils Absolute: 0.1 10*3/uL (ref 0.0–0.1)
Basophils Relative: 1 %
Eosinophils Absolute: 0.3 10*3/uL (ref 0.0–0.5)
Eosinophils Relative: 3 %
HCT: 48 % (ref 39.0–52.0)
Hemoglobin: 15.9 g/dL (ref 13.0–17.0)
Immature Granulocytes: 0 %
Lymphocytes Relative: 15 %
Lymphs Abs: 1.7 10*3/uL (ref 0.7–4.0)
MCH: 30.2 pg (ref 26.0–34.0)
MCHC: 33.1 g/dL (ref 30.0–36.0)
MCV: 91.3 fL (ref 80.0–100.0)
Monocytes Absolute: 1.1 10*3/uL — ABNORMAL HIGH (ref 0.1–1.0)
Monocytes Relative: 9 %
Neutro Abs: 8.5 10*3/uL — ABNORMAL HIGH (ref 1.7–7.7)
Neutrophils Relative %: 72 %
Platelets: 192 10*3/uL (ref 150–400)
RBC: 5.26 MIL/uL (ref 4.22–5.81)
RDW: 14.7 % (ref 11.5–15.5)
WBC: 11.8 10*3/uL — ABNORMAL HIGH (ref 4.0–10.5)
nRBC: 0 % (ref 0.0–0.2)

## 2022-04-05 ENCOUNTER — Encounter (HOSPITAL_BASED_OUTPATIENT_CLINIC_OR_DEPARTMENT_OTHER): Payer: Self-pay

## 2022-04-12 ENCOUNTER — Other Ambulatory Visit: Payer: Self-pay | Admitting: Family Medicine

## 2022-04-12 DIAGNOSIS — M4726 Other spondylosis with radiculopathy, lumbar region: Secondary | ICD-10-CM | POA: Diagnosis not present

## 2022-04-12 DIAGNOSIS — M48062 Spinal stenosis, lumbar region with neurogenic claudication: Secondary | ICD-10-CM | POA: Diagnosis not present

## 2022-04-16 ENCOUNTER — Encounter (HOSPITAL_BASED_OUTPATIENT_CLINIC_OR_DEPARTMENT_OTHER): Payer: Self-pay

## 2022-04-16 NOTE — Telephone Encounter (Signed)
Bp log as requested 

## 2022-04-17 DIAGNOSIS — Z01812 Encounter for preprocedural laboratory examination: Secondary | ICD-10-CM | POA: Diagnosis not present

## 2022-04-17 DIAGNOSIS — M48062 Spinal stenosis, lumbar region with neurogenic claudication: Secondary | ICD-10-CM | POA: Diagnosis not present

## 2022-04-18 ENCOUNTER — Other Ambulatory Visit: Payer: Self-pay | Admitting: Family Medicine

## 2022-04-18 DIAGNOSIS — E1159 Type 2 diabetes mellitus with other circulatory complications: Secondary | ICD-10-CM

## 2022-04-18 DIAGNOSIS — I152 Hypertension secondary to endocrine disorders: Secondary | ICD-10-CM

## 2022-04-19 DIAGNOSIS — I2510 Atherosclerotic heart disease of native coronary artery without angina pectoris: Secondary | ICD-10-CM | POA: Diagnosis not present

## 2022-04-19 DIAGNOSIS — G4733 Obstructive sleep apnea (adult) (pediatric): Secondary | ICD-10-CM | POA: Diagnosis not present

## 2022-04-19 DIAGNOSIS — M48062 Spinal stenosis, lumbar region with neurogenic claudication: Secondary | ICD-10-CM | POA: Diagnosis not present

## 2022-04-19 DIAGNOSIS — M4316 Spondylolisthesis, lumbar region: Secondary | ICD-10-CM | POA: Diagnosis not present

## 2022-04-19 DIAGNOSIS — Z951 Presence of aortocoronary bypass graft: Secondary | ICD-10-CM | POA: Diagnosis not present

## 2022-04-19 DIAGNOSIS — E785 Hyperlipidemia, unspecified: Secondary | ICD-10-CM | POA: Diagnosis not present

## 2022-04-19 DIAGNOSIS — Z9989 Dependence on other enabling machines and devices: Secondary | ICD-10-CM | POA: Diagnosis not present

## 2022-04-19 DIAGNOSIS — M4726 Other spondylosis with radiculopathy, lumbar region: Secondary | ICD-10-CM | POA: Diagnosis not present

## 2022-04-19 DIAGNOSIS — M5136 Other intervertebral disc degeneration, lumbar region: Secondary | ICD-10-CM | POA: Diagnosis not present

## 2022-04-19 DIAGNOSIS — M4306 Spondylolysis, lumbar region: Secondary | ICD-10-CM | POA: Diagnosis not present

## 2022-04-19 DIAGNOSIS — M47816 Spondylosis without myelopathy or radiculopathy, lumbar region: Secondary | ICD-10-CM | POA: Diagnosis not present

## 2022-04-19 DIAGNOSIS — J449 Chronic obstructive pulmonary disease, unspecified: Secondary | ICD-10-CM | POA: Diagnosis not present

## 2022-04-19 DIAGNOSIS — H409 Unspecified glaucoma: Secondary | ICD-10-CM | POA: Diagnosis not present

## 2022-04-19 DIAGNOSIS — M5116 Intervertebral disc disorders with radiculopathy, lumbar region: Secondary | ICD-10-CM | POA: Diagnosis not present

## 2022-04-19 DIAGNOSIS — I10 Essential (primary) hypertension: Secondary | ICD-10-CM | POA: Diagnosis not present

## 2022-04-19 DIAGNOSIS — M48061 Spinal stenosis, lumbar region without neurogenic claudication: Secondary | ICD-10-CM | POA: Diagnosis not present

## 2022-04-19 DIAGNOSIS — E119 Type 2 diabetes mellitus without complications: Secondary | ICD-10-CM | POA: Diagnosis not present

## 2022-04-19 DIAGNOSIS — I252 Old myocardial infarction: Secondary | ICD-10-CM | POA: Diagnosis not present

## 2022-04-20 DIAGNOSIS — I2510 Atherosclerotic heart disease of native coronary artery without angina pectoris: Secondary | ICD-10-CM | POA: Diagnosis not present

## 2022-04-20 DIAGNOSIS — M48062 Spinal stenosis, lumbar region with neurogenic claudication: Secondary | ICD-10-CM | POA: Diagnosis not present

## 2022-04-20 DIAGNOSIS — M5116 Intervertebral disc disorders with radiculopathy, lumbar region: Secondary | ICD-10-CM | POA: Diagnosis not present

## 2022-04-20 DIAGNOSIS — E119 Type 2 diabetes mellitus without complications: Secondary | ICD-10-CM | POA: Diagnosis not present

## 2022-04-20 DIAGNOSIS — I10 Essential (primary) hypertension: Secondary | ICD-10-CM | POA: Diagnosis not present

## 2022-04-20 DIAGNOSIS — I252 Old myocardial infarction: Secondary | ICD-10-CM | POA: Diagnosis not present

## 2022-04-20 DIAGNOSIS — M4726 Other spondylosis with radiculopathy, lumbar region: Secondary | ICD-10-CM | POA: Diagnosis not present

## 2022-04-20 DIAGNOSIS — M5136 Other intervertebral disc degeneration, lumbar region: Secondary | ICD-10-CM | POA: Diagnosis not present

## 2022-04-20 DIAGNOSIS — H409 Unspecified glaucoma: Secondary | ICD-10-CM | POA: Diagnosis not present

## 2022-04-23 ENCOUNTER — Telehealth: Payer: Self-pay | Admitting: Cardiovascular Disease

## 2022-04-23 NOTE — Telephone Encounter (Addendum)
Left message with PT that was following up with patient  Left message for patient to call back

## 2022-04-23 NOTE — Telephone Encounter (Signed)
Pt's Physical Therapist calling to make office aware that pt was hospitalized for a Low Back Fusion. Therapist also states that pt's BP was elevated this morning while visiting (150/96) no symptoms. He also states that there is a level 2 medication interaction.   carvedilol (COREG) 12.5 MG tablet  With   SYMBICORT 160-4.5 MCG/ACT inhaler  And   POTASSIUM CITRATE   With   spironolactone (ALDACTONE) 25 MG tablet  Please advise

## 2022-04-24 NOTE — Telephone Encounter (Signed)
Office visit 10/5 Coreg reduced due to hypotension. 04/16/22 via mychart message BP labile but average 131/64. Had lumbar laminectomy 04/19/22. Blood pressure could be elevated if he is having post operative pain.   Recommend he check BP at home for one week and contact us in one week with readings. Ensure checking at least one hour after medications and after sitting and resting for 5-10 minutes.   Medication interactions not of concern. 04/20/22 labs with normal renal function and potassium, may remain on Spironolactone and Potassium. Will monitor potassium level periodically. Interaction with symbicort and carvedilol only concerning if he is having difficulties with asthma exacerbations - no recent exacerbation.   Loel Dubonnet, NP

## 2022-04-25 NOTE — Telephone Encounter (Signed)
Left message for patient to call back with Clair Gulling from PT on confidential VM.   Left message for patient to call back with patient.    "Office visit 10/5 Coreg reduced due to hypotension. 04/16/22 via mychart message BP labile but average 131/64. Had lumbar laminectomy 04/19/22. Blood pressure could be elevated if he is having post operative pain.    Recommend he check BP at home for one week and contact us in one week with readings. Ensure checking at least one hour after medications and after sitting and resting for 5-10 minutes.    Medication interactions not of concern. 04/20/22 labs with normal renal function and potassium, may remain on Spironolactone and Potassium. Will monitor potassium level periodically. Interaction with symbicort and carvedilol only concerning if he is having difficulties with asthma exacerbations - no recent exacerbation.    Loel Dubonnet, NP "

## 2022-04-26 NOTE — Telephone Encounter (Signed)
Information relayed to [employer]

## 2022-04-26 NOTE — Telephone Encounter (Signed)
Jim from PT is returning call. Advised him that David Jaeger, RN has already talked to pt and that she would return his call later. He asked that a detail message be left on his VM.

## 2022-04-27 NOTE — Telephone Encounter (Signed)
Spoke with patient regarding blood pressure readings He has not checked today but does check most days Yesterday blood pressure 118/72  Asked if readings mostly less than 130 or above, stated some above but mostly below He is feeling better from recent procedure Advised to monitor blood pressure daily and send via mychart or call with readings in about 1 week, verbalized understanding

## 2022-04-30 ENCOUNTER — Other Ambulatory Visit: Payer: Self-pay | Admitting: Family Medicine

## 2022-05-01 ENCOUNTER — Inpatient Hospital Stay: Payer: Medicare PPO | Attending: Hematology and Oncology

## 2022-05-01 ENCOUNTER — Inpatient Hospital Stay: Payer: Medicare PPO | Admitting: Adult Health

## 2022-05-01 ENCOUNTER — Other Ambulatory Visit: Payer: Self-pay

## 2022-05-01 ENCOUNTER — Other Ambulatory Visit: Payer: Medicare PPO

## 2022-05-01 ENCOUNTER — Ambulatory Visit: Payer: Medicare PPO | Admitting: Adult Health

## 2022-05-01 ENCOUNTER — Encounter: Payer: Self-pay | Admitting: Adult Health

## 2022-05-01 VITALS — BP 128/50 | HR 71 | Temp 98.5°F | Resp 18 | Ht 68.0 in | Wt 282.0 lb

## 2022-05-01 DIAGNOSIS — D751 Secondary polycythemia: Secondary | ICD-10-CM | POA: Insufficient documentation

## 2022-05-01 DIAGNOSIS — Z87891 Personal history of nicotine dependence: Secondary | ICD-10-CM | POA: Diagnosis not present

## 2022-05-01 DIAGNOSIS — L040 Acute lymphadenitis of face, head and neck: Secondary | ICD-10-CM | POA: Diagnosis not present

## 2022-05-01 DIAGNOSIS — I889 Nonspecific lymphadenitis, unspecified: Secondary | ICD-10-CM

## 2022-05-01 LAB — CBC WITH DIFFERENTIAL/PLATELET
Abs Immature Granulocytes: 0.07 10*3/uL (ref 0.00–0.07)
Basophils Absolute: 0.1 10*3/uL (ref 0.0–0.1)
Basophils Relative: 1 %
Eosinophils Absolute: 0.9 10*3/uL — ABNORMAL HIGH (ref 0.0–0.5)
Eosinophils Relative: 7 %
HCT: 42.5 % (ref 39.0–52.0)
Hemoglobin: 13.7 g/dL (ref 13.0–17.0)
Immature Granulocytes: 1 %
Lymphocytes Relative: 17 %
Lymphs Abs: 2.3 10*3/uL (ref 0.7–4.0)
MCH: 30.4 pg (ref 26.0–34.0)
MCHC: 32.2 g/dL (ref 30.0–36.0)
MCV: 94.2 fL (ref 80.0–100.0)
Monocytes Absolute: 1 10*3/uL (ref 0.1–1.0)
Monocytes Relative: 8 %
Neutro Abs: 9 10*3/uL — ABNORMAL HIGH (ref 1.7–7.7)
Neutrophils Relative %: 66 %
Platelets: 308 10*3/uL (ref 150–400)
RBC: 4.51 MIL/uL (ref 4.22–5.81)
RDW: 14.6 % (ref 11.5–15.5)
WBC: 13.4 10*3/uL — ABNORMAL HIGH (ref 4.0–10.5)
nRBC: 0 % (ref 0.0–0.2)

## 2022-05-01 MED ORDER — AMOXICILLIN 875 MG PO TABS: 875.0000 mg | ORAL_TABLET | Freq: Two times a day (BID) | ORAL | 0 refills | Status: DC

## 2022-05-01 NOTE — Progress Notes (Signed)
[name]:    David Morale, MD [place] [id number]   DIAGNOSIS: Secondary Polycythemia  SUMMARY OF HEMATOLOGIC HISTORY: #1.  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  Monthly CBC and phlebotomy if hematocrit greater than 51--last 06/08/2021  CURRENT THERAPY: intermittent phlebotomy  INTERVAL HISTORY: David Brandt 76 y.o. male returns for f/u of his polcythema.  He has stopped testosterone and his hematocrit has continued to improve.  He did undergo back surgery 2 weeks ago.   He has a right neck swollen lymph node that came up two days ago that is also tender.     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  MEDICAL HISTORY: Past Medical History:  Diagnosis Date   Abnormal cardiovascular stress test 03/29/2016   Allergic rhinitis    Allergy    Arthritis    Asthma    Back pain    Diabetes ([facility]) 03/29/2016   Gout    History of kidney stones    Hyperlipidemia    Hypertension    Joint pain    Multiple food allergies    Prediabetes    Sleep apnea     cpap    SURGICAL HISTORY: Past Surgical History:  Procedure Laterality Date   CARDIAC CATHETERIZATION N/A 03/29/2016   Procedure: Right/Left Heart Cath and Coronary Angiography;  Surgeon: Nelva Bush, MD;  Location: [facility];  Service: Cardiovascular;  Laterality: N/A;   CARDIAC CATHETERIZATION N/A 03/29/2016   Procedure: Intravascular Pressure Wire/FFR Study;  Surgeon: Nelva Bush, MD;  Location: [facility];  Service: Cardiovascular;  Laterality: N/A;   CARDIAC CATHETERIZATION Left 04/02/2016   Procedure: FEMORAL ARTERIAL LINE INSERTION;  Surgeon: [name], MD;  Location: [facility];  Service: Open Heart Surgery;  Laterality: Left;   COLONOSCOPY  02/20/2018   per Dr. 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   Procedure: CORONARY ARTERY BYPASS GRAFTING (CABG) X 5 UTILIZING LEFT INTERNAL MAMMARY ARTERY AND ENDOSCOPICALLY HARVESTED GREATER SAPHENOUS VEIN ; Mammary to LAD, Sequencial 1st to 2nd Diagonal, Vein to OM, Vein to Distal RCA.;  Surgeon: [name], MD;  Location: [facility];  Service: Open Heart Surgery;  Laterality: N/A;   CYSTOSCOPY/URETEROSCOPY/HOLMIUM LASER/STENT PLACEMENT Bilateral 05/07/2019   Procedure: LEFT URETEROSCOPY/HOLMIUM LASER/STENT PLACEMENT/BIOPSY LEFT MID URETER/RIGHT RETROGRADE STENT PLACEMENT ;  Surgeon: [name], MD;  Location: [facility];  Service: Urology;  Laterality: Bilateral;   CYSTOSCOPY/URETEROSCOPY/HOLMIUM LASER/STENT PLACEMENT Bilateral 05/27/2019   Procedure: CYSTOSCOPY, URETEROSCOPY/ RETROGRADE PYELOGRAM/ HOLMIUM LASER/STENT EXCHANGE;  Surgeon: [name], MD;  Location: [facility];  Service: Urology;  Laterality: Bilateral;   LYMPH NODE BIOPSY Left 04/02/2016   Procedure: INCIDENTAL LEFT MAMMARY LYMPH NODE BIOPSY;  Surgeon: [name], MD;  Location: [facility];  Service: Open Heart Surgery;  Laterality: Left;   mass abdomen  1955    NEPHROLITHOTOMY Left 04/24/2019   Procedure: NEPHROLITHOTOMY PERCUTANEOUS WITH SURGEON ACCESS;  Surgeon: [name], MD;  Location: [facility];  Service: Urology;  Laterality: Left;   TEE WITHOUT CARDIOVERSION N/A 04/02/2016   Procedure: TRANSESOPHAGEAL ECHOCARDIOGRAM (TEE);  Surgeon: [name], MD;  Location: [facility];  Service: Open Heart Surgery;  Laterality: N/A;    SOCIAL HISTORY: Social History   Socioeconomic History   Marital status: Married    Spouse name: Hassan Rowan   Number of children: Y   Years of education: Not on file   Highest education level: Not on file  Occupational History   Not on file  Tobacco Use   Smoking status: Former    Packs/day: 1.00    Years: 47.00    Total pack years: 47.00    Types: Cigarettes    Quit date: 06/25/2018    Years since quitting: 3.8    Passive exposure: Past   Smokeless tobacco: Never  Vaping Use   Vaping Use: Never used  Substance and Sexual Activity   Alcohol use: Not Currently    Comment: occaional shot of vodka   Drug use: Yes    Frequency: 7.0 times per week    Types: Marijuana    Comment: marijuana-states still smoking 10/19/2020   Sexual activity: Not on file  Other Topics Concern   Not on file  Social History Narrative   Not on file   Social Determinants of Health   Financial Resource Strain: Low Risk  (08/07/2021)   Overall Financial Resource Strain (CARDIA)    Difficulty of Paying Living Expenses: Not hard at all  Food Insecurity: No Food Insecurity (08/07/2021)   Hunger Vital Sign    Worried About Running Out of Food in the Last Year: Never true    Ran Out of Food in the Last Year: Never true  Transportation Needs: No Transportation Needs (08/07/2021)   PRAPARE - Hydrologist (Medical): No    Lack of Transportation (Non-Medical): No  Physical Activity: Inactive (08/07/2021)   Exercise Vital Sign    Days of Exercise per Week: 0 days    Minutes of Exercise per Session: 0 min   Stress: No Stress Concern Present (08/07/2021)   [place]    Feeling of Stress : Not at all  Social Connections: Moderately Integrated (08/31/2020)   Social Connection and Isolation Panel [NHANES]    Frequency of Communication with Friends and Family: More than three times a week    Frequency of Social Gatherings with Friends and Family: More than three times a week    Attends Religious Services: 1 to 4 times per year    Active Member of [employer] or Organizations: No    Attends Archivist Meetings: Never    Marital Status: Married  Human resources officer Violence: Not At Risk (08/31/2020)   Humiliation, Afraid, Rape, and Kick questionnaire    Fear of Current or Ex-Partner: No    Emotionally Abused: No    Physically Abused: No    Sexually Abused: No    FAMILY HISTORY: Family History  Problem Relation Age of Onset   Colon cancer Father 28   Cancer Father  Alcohol abuse Father    Stroke Mother    Diabetes Mother    Hypertension Mother    Hyperlipidemia Mother    Obesity Mother    Heart disease Maternal Aunt    Diabetes Maternal Uncle    Stomach cancer Neg Hx    Esophageal cancer Neg Hx    Rectal cancer Neg Hx     Review of Systems  Constitutional:  Negative for appetite change, chills, fatigue, fever and unexpected weight change.  HENT:   Negative for hearing loss, lump/mass and trouble swallowing.   Eyes:  Negative for eye problems and icterus.  Respiratory:  Negative for chest tightness, cough and shortness of breath.   Cardiovascular:  Negative for chest pain, leg swelling and palpitations.  Gastrointestinal:  Negative for abdominal distention, abdominal pain, constipation, diarrhea, nausea and vomiting.  Endocrine: Negative for hot flashes.  Genitourinary:  Negative for difficulty urinating.   Musculoskeletal:  Negative for arthralgias.  Skin:  Negative for itching and rash.  Neurological:  Negative  for dizziness, extremity weakness, headaches and numbness.  Hematological:  Negative for adenopathy. Does not bruise/bleed easily.  Psychiatric/Behavioral:  Negative for depression. The patient is not nervous/anxious.       PHYSICAL EXAMINATION  ECOG PERFORMANCE STATUS: 1 - Symptomatic but completely ambulatory  Vitals:   05/01/22 1327  BP: (!) 128/50  Pulse: 71  Resp: 18  Temp: 98.5 F (36.9 C)  SpO2: 97%    Physical Exam Constitutional:      General: He is not in acute distress.    Appearance: Normal appearance. He is not toxic-appearing.  HENT:     Head: Normocephalic and atraumatic.  Eyes:     General: No scleral icterus. Cardiovascular:     Rate and Rhythm: Normal rate and regular rhythm.     Pulses: Normal pulses.     Heart sounds: Normal heart sounds.  Pulmonary:     Effort: Pulmonary effort is normal.     Breath sounds: Normal breath sounds.  Abdominal:     General: Abdomen is flat. Bowel sounds are normal. There is no distension.     Palpations: Abdomen is soft.     Tenderness: There is no abdominal tenderness.  Musculoskeletal:        General: No swelling.     Cervical back: Neck supple.  Lymphadenopathy:     Cervical: Cervical adenopathy (swollen lymph node, +TTP, approx 2 cm in size) present.  Skin:    General: Skin is warm and dry.     Findings: No rash.  Neurological:     General: No focal deficit present.     Mental Status: He is alert.  Psychiatric:        Mood and Affect: Mood normal.        Behavior: Behavior normal.     LABORATORY DATA:  CBC    Component Value Date/Time   WBC 13.4 (H) 05/01/2022 1300   RBC 4.51 05/01/2022 1300   HGB 13.7 05/01/2022 1300   HGB 20.1 (HH) 07/27/2020 1056   HCT 42.5 05/01/2022 1300   HCT 59.9 (H) 07/27/2020 1056   PLT 308 05/01/2022 1300   PLT 195 07/27/2020 1056   MCV 94.2 05/01/2022 1300   MCV 92 07/27/2020 1056   MCH 30.4 05/01/2022 1300   MCHC 32.2 05/01/2022 1300   RDW 14.6 05/01/2022 1300    RDW 14.7 07/27/2020 1056   LYMPHSABS 2.3 05/01/2022 1300   LYMPHSABS 1.3 07/27/2020 1056   MONOABS 1.0 05/01/2022  1300   EOSABS 0.9 (H) 05/01/2022 1300   EOSABS 0.3 07/27/2020 1056   BASOSABS 0.1 05/01/2022 1300   BASOSABS 0.1 07/27/2020 1056    CMP     Component Value Date/Time   NA 139 03/14/2022 1513   NA 140 07/25/2020 1134   K 4.5 03/14/2022 1513   CL 103 03/14/2022 1513   CO2 30 03/14/2022 1513   GLUCOSE 92 03/14/2022 1513   BUN 22 03/14/2022 1513   BUN 11 07/25/2020 1134   CREATININE 1.33 03/14/2022 1513   CREATININE 1.21 09/08/2020 1127   CREATININE 1.03 05/02/2016 0946   CALCIUM 9.8 03/14/2022 1513   PROT 7.4 09/08/2020 1127   PROT 7.0 07/25/2020 1134   ALBUMIN 3.9 09/08/2020 1127   ALBUMIN 4.2 07/25/2020 1134   AST 20 09/08/2020 1127   ALT 24 09/08/2020 1127   ALKPHOS 88 09/08/2020 1127   BILITOT 1.0 09/08/2020 1127   GFRNONAA >60 09/08/2020 1127   GFRAA 75 07/25/2020 1134      ASSESSMENT and THERAPY PLAN:   Polycythemia, secondary Andoni is a 76 year old male with 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  His hematocrit remains under 48 since he has stopped his testosterone.  We discussed that we will do 1 more lab and follow-up in 6 months and so long as labs remain stable at that point he can likely be released from our oversight.  Acute cervical lymphadenitis With his elevated white blood cell count along with the tender anterior cervical lymph node I prescribed amoxicillin 875 mg p.o. twice daily for him to take for lymphadenitis.  I recommended that he follow-up with his primary care provider in around a week or so to ensure that the lymphadenopathy has resolved.  All questions were answered. The patient knows to call the clinic with any problems, questions or concerns. We can certainly see the patient much sooner if necessary.  Total encounter time:20 minutes*in face-to-face visit time, chart review, lab review, care  coordination, order entry, and documentation of the encounter time.    Wilber Bihari, NP 05/04/22 3:47 PM Medical Oncology and Hematology [facility] [place], [place] [address] Tel. [phone]    Fax. [phone]  *Total Encounter Time as defined by the Centers for Medicare and Medicaid Services includes, in addition to the face-to-face time of a patient visit (documented in the note above) non-face-to-face time: obtaining and reviewing outside history, ordering and reviewing medications, tests or procedures, care coordination (communications with other health care professionals or caregivers) and documentation in the medical record.

## 2022-05-02 DIAGNOSIS — I10 Essential (primary) hypertension: Secondary | ICD-10-CM | POA: Diagnosis not present

## 2022-05-02 DIAGNOSIS — I2510 Atherosclerotic heart disease of native coronary artery without angina pectoris: Secondary | ICD-10-CM | POA: Diagnosis not present

## 2022-05-02 DIAGNOSIS — Z9181 History of falling: Secondary | ICD-10-CM | POA: Diagnosis not present

## 2022-05-02 DIAGNOSIS — Z4789 Encounter for other orthopedic aftercare: Secondary | ICD-10-CM | POA: Diagnosis not present

## 2022-05-03 ENCOUNTER — Encounter: Payer: Self-pay | Admitting: Family Medicine

## 2022-05-03 ENCOUNTER — Ambulatory Visit (INDEPENDENT_AMBULATORY_CARE_PROVIDER_SITE_OTHER): Payer: Medicare PPO | Admitting: Family Medicine

## 2022-05-03 ENCOUNTER — Other Ambulatory Visit: Payer: Self-pay | Admitting: Cardiovascular Disease

## 2022-05-03 VITALS — BP 108/60 | HR 61 | Temp 97.6°F | Wt 280.0 lb

## 2022-05-03 DIAGNOSIS — R590 Localized enlarged lymph nodes: Secondary | ICD-10-CM

## 2022-05-03 NOTE — Progress Notes (Signed)
   Subjective:    Patient ID: David Brandt, male    DOB: 1946/06/24, 76 y.o.   MRN: [id number]  HPI Here to check a swollen lymph node in the neck that he first noticed about 3 days ago. This tender to the touch, but otherwise does not bother him. He feels fine in general, no fever or ST. He saw Epimenio Sarin NP at the [facility]cology center yesterday for polycythemia, and his HCT is down to 42.5. however his WBC was high at 13.4. This is likely the result of whatever caused the lymph node to be inflamed. She started him on 7 days of Amoxicillin, and the node is already significantly smaller. He still feels good in general.    Review of Systems  Constitutional: Negative.   HENT:  Negative for congestion, ear pain, postnasal drip, sinus pressure and sore throat.   Eyes: Negative.   Respiratory: Negative.         Objective:   Physical Exam Constitutional:      Appearance: Normal appearance.  HENT:     Right Ear: Tympanic membrane, ear canal and external ear normal.     Left Ear: Tympanic membrane, ear canal and external ear normal.     Nose: Nose normal.     Mouth/Throat:     Pharynx: Oropharynx is clear.  Eyes:     Conjunctiva/sclera: Conjunctivae normal.  Neck:     Comments: Single enlarged node in the right anterior neck that is tender  Pulmonary:     Effort: Pulmonary effort is normal.     Breath sounds: Normal breath sounds.  Neurological:     Mental Status: He is alert.           Assessment & Plan:  Isolated AC lymph adenopathy. This is obviously inflammatory because of the way it came up so quickly and the way it is responding to the Amoxicillin so quickly. He will finish the 7 days of Amoxicillin, and then he can follow up as needed.  Alysia Penna, MD

## 2022-05-03 NOTE — Telephone Encounter (Signed)
Rx request sent to pharmacy.  

## 2022-05-04 DIAGNOSIS — L040 Acute lymphadenitis of face, head and neck: Secondary | ICD-10-CM | POA: Insufficient documentation

## 2022-05-04 NOTE — Assessment & Plan Note (Signed)
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  His hematocrit remains under 48 since he has stopped his testosterone.  We discussed that we will do 1 more lab and follow-up in 6 months and so long as labs remain stable at that point he can likely be released from our oversight.

## 2022-05-04 NOTE — Assessment & Plan Note (Signed)
With his elevated white blood cell count along with the tender anterior cervical lymph node I prescribed amoxicillin 875 mg p.o. twice daily for him to take for lymphadenitis.  I recommended that he follow-up with his primary care provider in around a week or so to ensure that the lymphadenopathy has resolved.

## 2022-05-07 ENCOUNTER — Telehealth: Payer: Self-pay

## 2022-05-07 ENCOUNTER — Telehealth: Payer: Self-pay | Admitting: Family Medicine

## 2022-05-07 ENCOUNTER — Telehealth: Payer: Self-pay | Admitting: Hematology and Oncology

## 2022-05-07 NOTE — Telephone Encounter (Signed)
-  Caller states he is having swelling in the lips. Caller states he have nut allergies. Caller ate a nut chew bar last night. Caller state she ate a pear this morning. Caller took two Benadryl about 11:30 am, first noticed symptoms approx 10:30 . Lips are still swollen, L eye was puffy compared to R eye as L check. Woke up fine and felt these symptoms later. Is with [facility] who is doing home visit.  05/07/2022 12:51:29 PM SEE PCP WITHIN 3 DAYS Patsey Berthold, RN, [facility]  Comments User: Whitman Hero Date/Time Eilene Ghazi Time): 05/07/2022 12:36:08 PM Correction Caller states he ate a pear this morning.  User: Diana Eves, RN Date/Time Eilene Ghazi Time): 05/07/2022 12:42:59 PM Per [facility] L side of bottom lip is severely swollen. Since benadryl has not gotten any better or any worse.  User: Diana Eves, RN Date/Time Eilene Ghazi Time): 05/07/2022 12:51:45 PM  Caller will call office to make appointment. Referrals REFERRED TO PCP OFFICE  05/07/22 1341 - LVM instructions checking on pt & attempting to schedule OV.

## 2022-05-07 NOTE — Telephone Encounter (Signed)
Patient is returning nancy's call--he is wanting a call back but said to let Izora Gala know that the swelling is starting to go down.

## 2022-05-07 NOTE — Telephone Encounter (Signed)
Called patient per 11/10 in basket. Left voicemail for upcoming appointments.

## 2022-05-08 NOTE — Telephone Encounter (Signed)
Updated BP log

## 2022-05-10 DIAGNOSIS — M5136 Other intervertebral disc degeneration, lumbar region: Secondary | ICD-10-CM | POA: Diagnosis not present

## 2022-05-11 NOTE — Telephone Encounter (Signed)
Spoke with pt state that he is feeling well and the swelling has gone down

## 2022-05-18 ENCOUNTER — Ambulatory Visit: Payer: Medicare PPO | Admitting: Adult Health

## 2022-05-18 ENCOUNTER — Other Ambulatory Visit: Payer: Medicare PPO

## 2022-05-24 ENCOUNTER — Other Ambulatory Visit: Payer: Self-pay | Admitting: Family Medicine

## 2022-05-24 DIAGNOSIS — E1169 Type 2 diabetes mellitus with other specified complication: Secondary | ICD-10-CM

## 2022-05-27 ENCOUNTER — Other Ambulatory Visit: Payer: Self-pay | Admitting: General Practice

## 2022-05-28 ENCOUNTER — Other Ambulatory Visit: Payer: Self-pay | Admitting: Cardiovascular Disease

## 2022-05-28 NOTE — Telephone Encounter (Signed)
Rx request sent to pharmacy.  

## 2022-06-23 ENCOUNTER — Other Ambulatory Visit: Payer: Self-pay | Admitting: Family Medicine

## 2022-07-04 ENCOUNTER — Other Ambulatory Visit: Payer: Self-pay | Admitting: Cardiovascular Disease

## 2022-07-04 ENCOUNTER — Other Ambulatory Visit: Payer: Self-pay | Admitting: Family Medicine

## 2022-07-04 DIAGNOSIS — M48062 Spinal stenosis, lumbar region with neurogenic claudication: Secondary | ICD-10-CM | POA: Diagnosis not present

## 2022-07-04 DIAGNOSIS — F419 Anxiety disorder, unspecified: Secondary | ICD-10-CM

## 2022-07-04 NOTE — Telephone Encounter (Signed)
Rx(s) sent to pharmacy electronically.  

## 2022-07-15 ENCOUNTER — Other Ambulatory Visit: Payer: Self-pay | Admitting: General Practice

## 2022-07-25 ENCOUNTER — Ambulatory Visit (HOSPITAL_BASED_OUTPATIENT_CLINIC_OR_DEPARTMENT_OTHER): Payer: Medicare PPO | Admitting: Cardiovascular Disease

## 2022-07-25 ENCOUNTER — Encounter (HOSPITAL_BASED_OUTPATIENT_CLINIC_OR_DEPARTMENT_OTHER): Payer: Self-pay | Admitting: Cardiovascular Disease

## 2022-07-25 VITALS — BP 114/65 | HR 60 | Ht 68.0 in | Wt 291.1 lb

## 2022-07-25 DIAGNOSIS — I10 Essential (primary) hypertension: Secondary | ICD-10-CM

## 2022-07-25 DIAGNOSIS — G4733 Obstructive sleep apnea (adult) (pediatric): Secondary | ICD-10-CM

## 2022-07-25 DIAGNOSIS — E785 Hyperlipidemia, unspecified: Secondary | ICD-10-CM | POA: Diagnosis not present

## 2022-07-25 DIAGNOSIS — Z951 Presence of aortocoronary bypass graft: Secondary | ICD-10-CM

## 2022-07-25 DIAGNOSIS — J449 Chronic obstructive pulmonary disease, unspecified: Secondary | ICD-10-CM | POA: Diagnosis not present

## 2022-07-25 MED ORDER — METOPROLOL SUCCINATE ER 25 MG PO TB24: 25.0000 mg | ORAL_TABLET | Freq: Every day | ORAL | 3 refills | Status: DC

## 2022-07-25 NOTE — Assessment & Plan Note (Signed)
David Brandt has COPD.  He is struggling with exertional dyspnea.  I suspect that this is multifactorial.  He is not having any chest pain or pressure.  He is not volume overloaded on exam.  Prior stress testing and echocardiogram have been normal.  He has known moderate COPD.  He uses his Symbicort regularly but does not use his albuterol.  He is audibly wheezing on exam today we will stop carvedilol as above and switch to metoprolol.  I also recommend that he follow-up with pulmonology as he has not seen them for years.  Educated him on the importance of using albuterol prior to exercising and if he notices any wheezing.

## 2022-07-25 NOTE — Assessment & Plan Note (Signed)
David Brandt is struggling to lose weight.  He is actually been getting weight and I suspect that this is also contributing.  His prior PFTs also indicated restrictive lung disease.  We discussed the fact that given that he carries much of his weight in his abdomen, this also worsens his breathing.  We will have her meet with our pharmacist for consideration of a GLP-1 agonist.  Hopefully with this and improved breathing he will be able to increase his exercise.

## 2022-07-25 NOTE — Progress Notes (Signed)
Cardiology Office Note   Date:  07/25/22   ID:  David Brandt, DOB [birth year], MRN [id number]  PCP:  David Morale, MD  Cardiologist:   David Latch, MD  [facility] APP: David Ransom, PA-C  No chief complaint on file.   History of Present Illness: David Brandt is a 77 y.o. male with CAD s/p CABG (LIMA-->LAD, SVG-->D1,D2, SVG-->OM, SVG-->RCA) , diabetes, OSA on CPAP, post-operative atrial fibrillation, hypertension, emphysema, and hyperlipidemia who presents for follow.   David Brandt initially presented with chest pressure and shortness of breath 03/2016.  He underwent Lexiscan Myoview on 03/28/16 that revealed LVEF 48% with a large inferior and inferior lateral MI and no evidence of ischemia. There was also basal to mid inferior wall akinesis.  He subsequently underwent left heart catheterization where he was found to have severe three-vessel CAD.  He underwent CABG with Dr. Servando Brandt on 04/02/16.  He developed post-operative atrial fibrillation and was started on amiodarone.  There were were no other postoperative complications and he was discharged 04/11/16.  Echo 03/30/16 revealed LVEF 55-60% and was otherwise unremarkable.  Amiodarone was discontinued 07/2016.  Hydralazine was added to his regimen.  He had another stress test 03/2019 that revealed LVEF 58% and no ischemia. ECHO at that time revealed LVEF 65-70% and grade 2 diastolic dysfunction.  On 04/11/20, he reported his blood pressure sometimes got low and he felt palpitations when he took it 3 times a day.  He has been taking it twice a day and this seems to be much better. He also complained of urinary frequency and back pain. He felt that he had not fully recovered from [place] infection 07/2019.  He was seen by David Montana, NP on 03/29/22 for preoperative clearance for L2-L5 laminectomy. EKG showed NSR 47 bpm with no acute ST/T wave changes. GDMT Aspirin, Atorvastatin, Coreg. Coreg was reduced to 12.'5mg'$  BID. Atorvastatin '80mg'$   was continued.  David Brandt, his son called and said that he had been feeling more SOB. He also reports feeling more fatigued.  He reports that he frequently needs to sit while shopping for groceries due to experiencing SOB. His SOB limits his exercise. His noticed his SOB beginning a month ago. He reports that he has gained weight since out last visit. He previously weighted 278 pounds and weighs 291 pounds today. He attributes this gain to eating more following his surgery. He also complains of positional dizziness when standing. He denies any syncope episodes. His BP at home averages 151-761 systolic. This morning he reports a reading of 607 systolic, but never in the 90s. He does not take Albuterol as needed. He is compliant with his Symbicort. He feels some wheezing in his chest which is stable and unchanged from baseline. He does not have a pulmonologist. He has moderate to severe obstructive airway disease. He reports that he quit smoking 3-4 years ago, but continues to smoke marijuana. He occasionally drinks 2-3 drinks socially. He denies any palpitations, chest pain, or peripheral edema. No headaches, syncope, orthopnea, or PND.  Past Medical History:  Diagnosis Date   Abnormal cardiovascular stress test 03/29/2016   Allergic rhinitis    Allergy    Arthritis    Asthma    Back pain    Diabetes ([place]) 03/29/2016   Gout    History of kidney stones    Hyperlipidemia    Hypertension    Joint pain    Multiple food allergies    Prediabetes    Sleep apnea  cpap    Past Surgical History:  Procedure Laterality Date   CARDIAC CATHETERIZATION N/A 03/29/2016   Procedure: Right/Left Heart Cath and Coronary Angiography;  Surgeon: Nelva Bush, MD;  Location: [place] [facility];  Service: Cardiovascular;  Laterality: N/A;   CARDIAC CATHETERIZATION N/A 03/29/2016   Procedure: Intravascular Pressure Wire/FFR Study;  Surgeon: Nelva Bush, MD;  Location: [place] [facility];  Service:  Cardiovascular;  Laterality: N/A;   CARDIAC CATHETERIZATION Left 04/02/2016   Procedure: FEMORAL ARTERIAL LINE INSERTION;  Surgeon: [name], MD;  Location: [place];  Service: Open Heart Surgery;  Laterality: Left;   COLONOSCOPY  02/20/2018   per Dr. 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   Procedure: CORONARY ARTERY BYPASS GRAFTING (CABG) X 5 UTILIZING LEFT INTERNAL MAMMARY ARTERY AND ENDOSCOPICALLY HARVESTED GREATER SAPHENOUS VEIN ; Mammary to LAD, Sequencial 1st to 2nd Diagonal, Vein to OM, Vein to Distal RCA.;  Surgeon: [name], MD;  Location: [place];  Service: Open Heart Surgery;  Laterality: N/A;   CYSTOSCOPY/URETEROSCOPY/HOLMIUM LASER/STENT PLACEMENT Bilateral 05/07/2019   Procedure: LEFT URETEROSCOPY/HOLMIUM LASER/STENT PLACEMENT/BIOPSY LEFT MID URETER/RIGHT RETROGRADE STENT PLACEMENT ;  Surgeon: [name], MD;  Location: [facility];  Service: Urology;  Laterality: Bilateral;   CYSTOSCOPY/URETEROSCOPY/HOLMIUM LASER/STENT PLACEMENT Bilateral 05/27/2019   Procedure: CYSTOSCOPY, URETEROSCOPY/ RETROGRADE PYELOGRAM/ HOLMIUM LASER/STENT EXCHANGE;  Surgeon: [name], MD;  Location: [facility];  Service: Urology;  Laterality: Bilateral;   LYMPH NODE BIOPSY Left 04/02/2016   Procedure: INCIDENTAL LEFT MAMMARY LYMPH NODE BIOPSY;  Surgeon: [name], MD;  Location: [place];  Service: Open Heart Surgery;  Laterality: Left;   mass abdomen  1955   NEPHROLITHOTOMY Left 04/24/2019   Procedure: NEPHROLITHOTOMY PERCUTANEOUS WITH SURGEON ACCESS;  Surgeon: [name], MD;  Location: [facility];  Service: Urology;  Laterality: Left;   TEE WITHOUT CARDIOVERSION N/A 04/02/2016   Procedure: TRANSESOPHAGEAL ECHOCARDIOGRAM (TEE);  Surgeon: [name], MD;  Location: [place];  Service: Open Heart Surgery;  Laterality: N/A;     Current Outpatient Medications  Medication Sig Dispense Refill   albuterol  (VENTOLIN HFA) 108 (90 Base) MCG/ACT inhaler Inhale 2 puffs into the lungs every 4 (four) hours as needed for wheezing or shortness of breath. 8 g 5   Alcohol Swabs (DROPSAFE ALCOHOL PREP) 70 % PADS USE AS DIRECTED 300 each 0   amoxicillin (AMOXIL) 875 MG tablet Take 1 tablet (875 mg total) by mouth 2 (two) times daily. 14 tablet 0   Apoaequorin (PREVAGEN PO) Take 20 mg by mouth.     aspirin EC 81 MG tablet Take 81 mg by mouth daily.     atorvastatin (LIPITOR) 80 MG tablet Take 1 tablet (80 mg total) by mouth daily. 90 tablet 2   budesonide-formoterol (SYMBICORT) 160-4.5 MCG/ACT inhaler TAKE 2 PUFFS BY MOUTH TWICE A DAY 30.6 each 0   celecoxib (CELEBREX) 200 MG capsule Take 1 capsule (200 mg total) by mouth 2 (two) times daily. 180 capsule 3   Cholecalciferol (VITAMIN D3) 25 MCG (1000 UT) CAPS Take 2,000 Units by mouth daily.     felodipine (PLENDIL) 10 MG 24 hr tablet Take 1 tablet (10 mg total) by mouth daily. 90 tablet 1   gabapentin (NEURONTIN) 300 MG capsule TAKE 1 CAPSULE BY MOUTH THREE TIMES A DAY 90 capsule 1   hydrALAZINE (APRESOLINE) 50 MG tablet TAKE 1 TABLET TWICE DAILY 180 tablet 3  hydrochlorothiazide (HYDRODIURIL) 25 MG tablet TAKE 1 TABLET EVERY DAY (NEED MD APPOINTMENT FOR REFILLS) 60 tablet 6   lidocaine (LIDODERM) 5 % Place 1 patch onto the skin daily. Remove & Discard patch within 12 hours or as directed by MD 30 patch 0   metFORMIN (GLUCOPHAGE-XR) 500 MG 24 hr tablet TAKE 1 TABLET EVERY DAY 90 tablet 3   metoprolol succinate (TOPROL XL) 25 MG 24 hr tablet Take 1 tablet (25 mg total) by mouth daily. 90 tablet 3   OVER THE COUNTER MEDICATION Take 2 capsules by mouth 2 (two) times daily. OmegaXL     oxyCODONE-acetaminophen (PERCOCET/ROXICET) 5-325 MG tablet Take 1 tablet by mouth 4 (four) times daily as needed.     POTASSIUM CITRATE PO Take 15 mEq by mouth.     sertraline (ZOLOFT) 50 MG tablet TAKE 1 TABLET EVERY DAY 90 tablet 0   spironolactone (ALDACTONE) 25 MG tablet TAKE 1  TABLET EVERY DAY 90 tablet 3   tiZANidine (ZANAFLEX) 2 MG tablet Take 2 mg by mouth 2 (two) times daily as needed.     TRUE METRIX BLOOD GLUCOSE TEST test strip TEST BLOOD SUGAR AS DIRECTED 300 strip 0   No current facility-administered medications for this visit.    Allergies:   Other, Yellow jacket venom, Allopurinol, Justicia adhatoda (malabar nut tree) [justicia adhatoda], Valsartan-hydrochlorothiazide, and Sulfa antibiotics    Social History:  The patient  reports that he quit smoking about 4 years ago. His smoking use included cigarettes. He has a 47.00 pack-year smoking history. He has been exposed to tobacco smoke. He has never used smokeless tobacco. He reports that he does not currently use alcohol. He reports current drug use. Frequency: 7.00 times per week. Drug: Marijuana.   Family History:  [name] family history includes Alcohol abuse in his father; Cancer in his father; Colon cancer (age of onset: 41) in his father; Diabetes in his maternal uncle and mother; Heart disease in his maternal aunt; Hyperlipidemia in his mother; Hypertension in his mother; Obesity in his mother; Stroke in his mother.    ROS:  Please see the history of present illness.   (+)frequent SOB (+)wheezing unchanged from baseline (+)dizziness when standing Otherwise, review of systems are positive for none.     All other systems are reviewed and negative.    PHYSICAL EXAM: VS:  BP 114/65 (BP Location: Left Arm, Patient Position: Sitting, Cuff Size: Large)   Pulse 60   Ht '5\' 8"'$  (1.727 m)   Wt 291 lb 1.6 oz (132 kg)   SpO2 95%   BMI 44.26 kg/m  , BMI Body mass index is 44.26 kg/m. GENERAL:  Well appearing HEENT: Pupils equal round and reactive, fundi not visualized, oral mucosa unremarkable NECK:  No jugular venous distention, waveform within normal limits, carotid upstroke brisk and symmetric, no bruits LUNGS:  Clear to auscultation bilaterally HEART:  RRR.  PMI not displaced or  sustained,S1 and S2 within normal limits, no S3, no S4, no clicks, no rubs, no murmurs ABD:  Flat, positive bowel sounds normal in frequency in pitch, no bruits, no rebound, no guarding, no midline pulsatile mass, no hepatomegaly, no splenomegaly EXT:  2 plus pulses throughout, no edema, no cyanosis no clubbing SKIN:  No rashes no nodules NEURO:  Cranial nerves II through XII grossly intact, motor grossly intact throughout PSYCH:  Cognitively intact, oriented to person place and tim  EKG:  EKG is personally reviewed. 07/25/22: Sinus rhythm. Rate 60 bpm.  05/02/16:  sinus rhythm. Rate 65 bpm.  12/09/17: Sinus rhythm.  Rate 68 bpm.  Prior septal infarct.  Inferolateral TWI 03/23/19: Sinus rhythm.  Rate 75 bpm. 10/02/2019: Sinus rhythm.  Rate 63 bpm.  Left axis deviation.  Low voltage.  Prior septal infarct.  Lateral T wave inversions.  Syncope in 3 months and follow-up  ECHO 03/2019 IMPRESSIONS    1. Left ventricular ejection fraction, by visual estimation, is 65 to  70%. The left ventricle has normal function. Normal left ventricular size.  There is no left ventricular hypertrophy.   2. Left ventricular diastolic Doppler parameters are consistent with  pseudonormalization pattern of LV diastolic filling.   3. Global right ventricle has normal systolic function.The right  ventricular size is normal. No increase in right ventricular wall  thickness.   4. Left atrial size was normal.   5. Right atrial size was normal.   6. The mitral valve is normal in structure. No evidence of mitral valve  regurgitation. No evidence of mitral stenosis.   7. The tricuspid valve is normal in structure. Tricuspid valve  regurgitation was not visualized by color flow Doppler.   8. The aortic valve is normal in structure. Aortic valve regurgitation  was not visualized by color flow Doppler. Mild aortic valve sclerosis  without stenosis.   9. The pulmonic valve was normal in structure. Pulmonic valve   regurgitation is not visualized by color flow Doppler.  10. The inferior vena cava is normal in size with greater than 50%  respiratory variability, suggesting right atrial pressure of 3 mmHg.   In comparison to the previous echocardiogram(s): 12/17/17 EF 55-60%.   Leane Call 03/2019 Study highlights    Nuclear stress EF: 58%. There was no ST segment deviation noted during stress. The study is normal. This is a low risk study. The left ventricular ejection fraction is normal (55-65%).   Normal stress nuclear study with no ischemia or infarction.  Gated ejection fraction 58% with normal wall motion.  Lexiscan Myoview 03/29/16: Nuclear stress EF: 48%. The left ventricular ejection fraction is mildly decreased (45-54%).   Large inferior and inferior lateral wall MI from apex to base with no ischemia EF 48% with mid and basal inferior wall akinesis   LHC 03/29/16: Conclusions: Severe 3 vessel coronary artery disease, as detailed below, including 50% distal LMCA/ostial LAD stenoses (FFR 0.70), 80% D1 lesion, 80% ostial LCx stenosis, and diffusely diseased RCA with 99% mid RCA stenosis with TIMI-1 flow and competitive filling of the PDA and rPL branches by left-to-right collaterals. Mildly decreased LV systolic function with inferior akinesis (LVEF 45-50%) Upper normal to mildly elevated left heart filling pressures. Reduced Fick cardiac output.   Echo  03/30/16:  Study Conclusions   - Left ventricle: The cavity size was normal. Wall thickness was   increased in a pattern of moderate LVH. Systolic function was   normal. The estimated ejection fraction was in the range of 55%   to 60%. Left ventricular diastolic function parameters were   normal. - Left atrium: The atrium was mildly dilated. - Atrial septum: No defect or patent foramen ovale was identified. - Pulmonary arteries: PA peak pressure: 34 mm Hg (S).  Recent Labs: 03/14/2022: BUN 22; Creatinine, Ser 1.33; Potassium  4.5; Sodium 139 05/01/2022: Hemoglobin 13.7; Platelets 308    Lipid Panel    Component Value Date/Time   CHOL 115 07/25/2020 1134   TRIG 194 (H) 07/25/2020 1134   HDL 39 (L) 07/25/2020 1134   CHOLHDL  3.0 04/14/2020 1100   CHOLHDL 4.1 03/30/2016 0156   VLDL 59 (H) 03/30/2016 0156   LDLCALC 44 07/25/2020 1134      Wt Readings from Last 3 Encounters:  07/25/22 291 lb 1.6 oz (132 kg)  05/03/22 280 lb (127 kg)  05/01/22 282 lb (127.9 kg)      ASSESSMENT AND PLAN:  Essential hypertension Blood pressure is well-controlled.  Carvedilol may be contributing to his wheezing.  We will stop the carvedilol and switch to metoprolol 25 mg daily.  Continue felodipine, hydralazine, hydrochlorothiazide, and spironolactone.  OSA (obstructive sleep apnea) Continue CPAP.  COPD, moderate ([place]) Mr. Ptacek has COPD.  He is struggling with exertional dyspnea.  I suspect that this is multifactorial.  He is not having any chest pain or pressure.  He is not volume overloaded on exam.  Prior stress testing and echocardiogram have been normal.  He has known moderate COPD.  He uses his Symbicort regularly but does not use his albuterol.  He is audibly wheezing on exam today we will stop carvedilol as above and switch to metoprolol.  I also recommend that he follow-up with pulmonology as he has not seen them for years.  Educated him on the importance of using albuterol prior to exercising and if he notices any wheezing.  S/P CABG x 5 Stable.  Continue aspirin, atorvastatin, felodipine, and switching carvedilol to metoprolol as above.  Dyslipidemia He is due for fasting lipid and a CMP.  Continue atorvastatin.  Morbid obesity [facility]) Mr. Troop is struggling to lose weight.  He is actually been getting weight and I suspect that this is also contributing.  His prior PFTs also indicated restrictive lung disease.  We discussed the fact that given that he carries much of his weight in his abdomen, this also  worsens his breathing.  We will have her meet with our pharmacist for consideration of a GLP-1 agonist.  Hopefully with this and improved breathing he will be able to increase his exercise.   Current medicines are reviewed at length with the patient today.  The patient does not have concerns regarding medicines.  The following changes have been made:  no change  Labs/ tests ordered today include:   Orders Placed This Encounter  Procedures   Lipid panel   Comprehensive metabolic panel   AMB Referral to [facility] Pharm-D   Ambulatory referral to Pulmonology   EKG 12-Lead     Disposition:   FU with Justice Milliron C. Oval Linsey, MD, [facility]  in 2-3 months   I,Mitra Faeizi,acting as a scribe for David Latch, MD.,have documented all relevant documentation on the behalf of David Latch, MD,as directed by  David Latch, MD while in the presence of David Latch, MD.  I, [place] Oval Linsey, MD have reviewed all documentation for this visit.  The documentation of the exam, diagnosis, procedures, and orders on 07/25/2022 are all accurate and complete.    Signed, Aryssa Rosamond C. Oval Linsey, MD, [facility]  07/25/2022 1:52 PM    [facility]

## 2022-07-25 NOTE — Assessment & Plan Note (Signed)
Stable.  Continue aspirin, atorvastatin, felodipine, and switching carvedilol to metoprolol as above.

## 2022-07-25 NOTE — Assessment & Plan Note (Signed)
Blood pressure is well-controlled.  Carvedilol may be contributing to his wheezing.  We will stop the carvedilol and switch to metoprolol 25 mg daily.  Continue felodipine, hydralazine, hydrochlorothiazide, and spironolactone.

## 2022-07-25 NOTE — Patient Instructions (Signed)
Medication Instructions:  STOP CARVEDILOL   START METOPROLOL SUC 25 MG DAILY   *If you need a refill on your cardiac medications before your next appointment, please call your pharmacy*  Lab Work: FASTING LP/CMET WHEN YOU HAVE YOUR PHARM D APPOINTMENT   If you have labs (blood work) drawn today and your tests are completely normal, you will receive your results only by: [place] (if you have MyChart) OR A paper copy in the mail If you have any lab test that is abnormal or we need to change your treatment, we will call you to review the results.  Testing/Procedures: NONE   Follow-Up: At [facility], you and your health needs are our priority.  As part of our continuing mission to provide you with exceptional heart care, we have created designated Provider Care Teams.  These Care Teams include your primary Cardiologist (physician) and Advanced Practice Providers (APPs -  Physician Assistants and Nurse Practitioners) who all work together to provide you with the care you need, when you need it.  We recommend signing up for the patient portal called "MyChart".  Sign up information is provided on this After Visit Summary.  MyChart is used to connect with patients for Virtual Visits (Telemedicine).  Patients are able to view lab/test results, encounter notes, upcoming appointments, etc.  Non-urgent messages can be sent to your provider as well.   To learn more about what you can do with MyChart, go to [email or web address].    Your next appointment:   2 month(s)  Provider:   Skeet Latch, MD OR [place] Mam NP    [place]  Other Instructions You have been referred to PULMONARY

## 2022-07-25 NOTE — Assessment & Plan Note (Signed)
Continue CPAP.

## 2022-07-25 NOTE — Assessment & Plan Note (Signed)
He is due for fasting lipid and a CMP.  Continue atorvastatin.

## 2022-07-26 ENCOUNTER — Ambulatory Visit: Payer: Medicare PPO | Attending: Internal Medicine | Admitting: Pharmacist Clinician (PhC)/ Clinical Pharmacy Specialist

## 2022-07-26 VITALS — Ht 68.0 in | Wt 291.0 lb

## 2022-07-26 DIAGNOSIS — E785 Hyperlipidemia, unspecified: Secondary | ICD-10-CM | POA: Diagnosis not present

## 2022-07-26 DIAGNOSIS — E1169 Type 2 diabetes mellitus with other specified complication: Secondary | ICD-10-CM | POA: Diagnosis not present

## 2022-07-26 DIAGNOSIS — I10 Essential (primary) hypertension: Secondary | ICD-10-CM | POA: Diagnosis not present

## 2022-07-26 LAB — COMPREHENSIVE METABOLIC PANEL
ALT: 13 IU/L (ref 0–44)
AST: 15 IU/L (ref 0–40)
Albumin/Globulin Ratio: 1.6 (ref 1.2–2.2)
Albumin: 4.1 g/dL (ref 3.8–4.8)
Alkaline Phosphatase: 111 IU/L (ref 44–121)
BUN/Creatinine Ratio: 16 (ref 10–24)
BUN: 15 mg/dL (ref 8–27)
Bilirubin Total: 0.4 mg/dL (ref 0.0–1.2)
CO2: 27 mmol/L (ref 20–29)
Calcium: 9.7 mg/dL (ref 8.6–10.2)
Chloride: 100 mmol/L (ref 96–106)
Creatinine, Ser: 0.91 mg/dL (ref 0.76–1.27)
Globulin, Total: 2.5 g/dL (ref 1.5–4.5)
Glucose: 119 mg/dL — ABNORMAL HIGH (ref 70–99)
Potassium: 4.4 mmol/L (ref 3.5–5.2)
Sodium: 140 mmol/L (ref 134–144)
Total Protein: 6.6 g/dL (ref 6.0–8.5)
eGFR: 88 mL/min/{1.73_m2} (ref >59)

## 2022-07-26 LAB — LIPID PANEL
Chol/HDL Ratio: 2.9 ratio (ref 0.0–5.0)
Cholesterol, Total: 117 mg/dL (ref 100–199)
HDL: 41 mg/dL (ref >39)
LDL Chol Calc (NIH): 50 mg/dL (ref 0–99)
Triglycerides: 156 mg/dL — ABNORMAL HIGH (ref 0–149)
VLDL Cholesterol Cal: 26 mg/dL (ref 5–40)

## 2022-07-26 NOTE — Progress Notes (Signed)
Office Visit    Patient Name: David Brandt Date of Encounter: 07/29/2022  Primary Care Provider:  [name], MD Primary Cardiologist:  Skeet Latch, MD  Chief Complaint    Weight management  Significant Past Medical History   DM2 Metofromin '500mg'$  qd  HTN Felodipine, hydralazine, hctz, metoprolol, spironolactone  CAD CABG x 5  hypogonadism On testosterone supplementation    Allergies  Allergen Reactions   Other Hives   Yellow Jacket Venom Anaphylaxis   Allopurinol     Joint pain    Justicia Adhatoda ([place] Nut Tree) [Justicia Adhatoda]     Allergic to Walnuts, hickory nuts, and almonds   Valsartan-Hydrochlorothiazide     Lip swelling    Sulfa Antibiotics Hives    History of Present Illness    David Brandt is a 77 y.o. male patient of Dr Oval Linsey, in the office today to discuss options for weight management.  He tells me that he worked with [facility] last year and dropped about 50 pounds.  This was needed so that he could have back surgery.  Once the surgery was completed, he stopped seeing that group and is now up about 15 pounds (over the last 3 months).  His wife had gastric bypass surgery, so he is trying to eat the same foods and quantities that she does.    If diabetic and on insulin/sulfonylurea, can consider reducing dose to reduce risk of hypoglycemia  Current weight management medications: none  Previously tried meds: none, did work with [facility] in the past  Current meds that may affect weight: none  Baseline weight/BMI: 291 lb // 44.25  Insurance payor: [employer]  Diet: mostly home cooked meals, (wife with gastric bypass) - now getting one meal and split main course;  sweets are downfall;  plenty of vegetables (due to wife)  Exercise: gets short of breath just walking short distances   Family History: mother died MI and stroke at 95, father died in his 51's; 1 sister w/o heart  issues; kids 72, 1, daughter 70  Confirmed patient no personal or family history of medullary thyroid carcinoma (MTC) or Multiple Endocrine Neoplasia syndrome type 2 (MEN 2).   Social History:   Tobacco: quit about 4 years ago, still smokes marijjuana  Alcohol: rare  Caffeine: coffee most mornigs at home   Accessory Clinical Findings    Lab Results  Component Value Date   CREATININE 0.91 07/26/2022   BUN 15 07/26/2022   NA 140 07/26/2022   K 4.4 07/26/2022   CL 100 07/26/2022   CO2 27 07/26/2022   Lab Results  Component Value Date   ALT 13 07/26/2022   AST 15 07/26/2022   ALKPHOS 111 07/26/2022   BILITOT 0.4 07/26/2022   Lab Results  Component Value Date   HGBA1C 6.2 03/15/2022    Home Medications/Allergies    Current Outpatient Medications  Medication Sig Dispense Refill   albuterol (VENTOLIN HFA) 108 (90 Base) MCG/ACT inhaler Inhale 2 puffs into the lungs every 4 (four) hours as needed for wheezing or shortness of breath. 8 g 5   Alcohol Swabs (DROPSAFE ALCOHOL PREP) 70 % PADS USE AS DIRECTED 300 each 0   amoxicillin (AMOXIL) 875 MG tablet Take 1 tablet (875 mg total) by mouth 2 (two) times daily. 14 tablet 0   Apoaequorin (PREVAGEN PO) Take 20 mg by mouth.     aspirin EC 81 MG tablet Take 81 mg by  mouth daily.     atorvastatin (LIPITOR) 80 MG tablet Take 1 tablet (80 mg total) by mouth daily. 90 tablet 2   budesonide-formoterol (SYMBICORT) 160-4.5 MCG/ACT inhaler TAKE 2 PUFFS BY MOUTH TWICE A DAY 30.6 each 0   celecoxib (CELEBREX) 200 MG capsule Take 1 capsule (200 mg total) by mouth 2 (two) times daily. 180 capsule 3   Cholecalciferol (VITAMIN D3) 25 MCG (1000 UT) CAPS Take 2,000 Units by mouth daily.     felodipine (PLENDIL) 10 MG 24 hr tablet Take 1 tablet (10 mg total) by mouth daily. 90 tablet 1   gabapentin (NEURONTIN) 300 MG capsule TAKE 1 CAPSULE BY MOUTH THREE TIMES A DAY 90 capsule 1   hydrALAZINE (APRESOLINE) 50 MG tablet TAKE 1 TABLET TWICE DAILY 180  tablet 3   hydrochlorothiazide (HYDRODIURIL) 25 MG tablet TAKE 1 TABLET EVERY DAY (NEED MD APPOINTMENT FOR REFILLS) 60 tablet 6   lidocaine (LIDODERM) 5 % Place 1 patch onto the skin daily. Remove & Discard patch within 12 hours or as directed by MD 30 patch 0   metFORMIN (GLUCOPHAGE-XR) 500 MG 24 hr tablet TAKE 1 TABLET EVERY DAY 90 tablet 3   metoprolol succinate (TOPROL XL) 25 MG 24 hr tablet Take 1 tablet (25 mg total) by mouth daily. 90 tablet 3   OVER THE COUNTER MEDICATION Take 2 capsules by mouth 2 (two) times daily. OmegaXL     oxyCODONE-acetaminophen (PERCOCET/ROXICET) 5-325 MG tablet Take 1 tablet by mouth 4 (four) times daily as needed.     POTASSIUM CITRATE PO Take 15 mEq by mouth.     sertraline (ZOLOFT) 50 MG tablet TAKE 1 TABLET EVERY DAY 90 tablet 0   spironolactone (ALDACTONE) 25 MG tablet TAKE 1 TABLET EVERY DAY 90 tablet 3   tiZANidine (ZANAFLEX) 2 MG tablet Take 2 mg by mouth 2 (two) times daily as needed.     TRUE METRIX BLOOD GLUCOSE TEST test strip TEST BLOOD SUGAR AS DIRECTED 300 strip 0   No current facility-administered medications for this visit.     Allergies  Allergen Reactions   Other Hives   Yellow Jacket Venom Anaphylaxis   Allopurinol     Joint pain    Justicia Adhatoda ([place] Nut Tree) [Justicia Adhatoda]     Allergic to Walnuts, hickory nuts, and almonds   Valsartan-Hydrochlorothiazide     Lip swelling    Sulfa Antibiotics Hives    Assessment & Plan    Type 2 diabetes mellitus with other specified complication ([place]) Patient was able to loose 50 pounds in 2023, however gained some of that back after back surgery last fall.  Pharmacotherapy is appropriate to pursue as augmentation. Will start Mounjaro, as patient is also diabetic.    Confirmed patient with no personal or family history of medullary thyroid carcinoma (MTC) or Multiple Endocrine Neoplasia syndrome type 2 (MEN 2).   Advised patient on common side effects including nausea, diarrhea,  dyspepsia, decreased appetite, and fatigue. Counseled patient on reducing meal size and how to titrate medication to minimize side effects. Patient aware to call if intolerable side effects or if experiencing dehydration, abdominal pain, or dizziness. Patient will adhere to dietary modifications and will target at least 150 minutes of moderate intensity exercise weekly.   Injection technique reviewed at today's visit.    Titration Plan:  Will plan to follow the titration plan as below, pending patient is tolerating each dose before increasing to the next. Can slow titration if needed for tolerability.    -  Month 1: Inject 2.5 mg SQ once weekly x 4 weeks -Month 2: Inject 5 mg SQ once weekly x 4 weeks -Month 3: Inject 7.5 mg SQ once weekly x 4 weeks -Month 4+: Inject 10 mg SQ once weekly   Follow up in 3 months.    Tommy Medal PharmD CPP [facility]  [address] [place], [place] [address] [phone]

## 2022-07-26 NOTE — Patient Instructions (Addendum)
We will start the prior authorization process to get [facility] covered by your insurance.   TIPS FOR SUCCESS Write down the reasons why you want to lose weight and post it in a place where you'll see it often. Start small and work your way up. Keep in mind that it takes time to achieve goals, and small steps add up. Any additional movements help to burn calories. Taking the stairs rather than the elevator and parking at the far end of your parking lot are easy ways to start. Brisk walking for at least 30 minutes 4 or more days of the week is an excellent goal to work toward  [place] Did you know that it can take 15 minutes or more for your brain to receive the message that you've eaten? That means that, if you eat less food, but consume it slower, you may still feel satisfied. Eating a lot of fruits and vegetables can also help you feel fuller. Eat off of smaller plates so that moderate portions don't seem too small  TITRATION PLAN Will plan to follow the titration plan as below, pending patient is tolerating each dose before increasing to the next. Can slow titration if needed for tolerability.    -Weeks 1-4: Inject 2.5 mg SQ once weekly  -Weeks 5-8: Inject 5 mg SQ once weekly  -Weeks 9-12 Inject 7.5 mg SQ once weekly  -Weeks 13-16: Inject 10 mg SQ once weekly   Follow up in 3 months.  April 29 at 8:30 at the [facility] office  If you have any questions or concerns, please reach out to [name].  Norrine Ballester/Chris at [phone].  [place]

## 2022-07-29 ENCOUNTER — Encounter: Payer: Self-pay | Admitting: Pharmacist Clinician (PhC)/ Clinical Pharmacy Specialist

## 2022-07-29 NOTE — Assessment & Plan Note (Signed)
Patient was able to loose 50 pounds in 2023, however gained some of that back after back surgery last fall.  Pharmacotherapy is appropriate to pursue as augmentation. Will start Mounjaro, as patient is also diabetic.    Confirmed patient with no personal or family history of medullary thyroid carcinoma (MTC) or Multiple Endocrine Neoplasia syndrome type 2 (MEN 2).   Advised patient on common side effects including nausea, diarrhea, dyspepsia, decreased appetite, and fatigue. Counseled patient on reducing meal size and how to titrate medication to minimize side effects. Patient aware to call if intolerable side effects or if experiencing dehydration, abdominal pain, or dizziness. Patient will adhere to dietary modifications and will target at least 150 minutes of moderate intensity exercise weekly.   Injection technique reviewed at today's visit.    Titration Plan:  Will plan to follow the titration plan as below, pending patient is tolerating each dose before increasing to the next. Can slow titration if needed for tolerability.    -Month 1: Inject 2.5 mg SQ once weekly x 4 weeks -Month 2: Inject 5 mg SQ once weekly x 4 weeks -Month 3: Inject 7.5 mg SQ once weekly x 4 weeks -Month 4+: Inject 10 mg SQ once weekly   Follow up in 3 months.

## 2022-07-30 ENCOUNTER — Other Ambulatory Visit (HOSPITAL_COMMUNITY): Payer: Self-pay

## 2022-08-02 ENCOUNTER — Encounter (HOSPITAL_BASED_OUTPATIENT_CLINIC_OR_DEPARTMENT_OTHER): Payer: Self-pay | Admitting: Cardiovascular Disease

## 2022-08-02 ENCOUNTER — Telehealth: Payer: Self-pay | Admitting: Cardiovascular Disease

## 2022-08-02 NOTE — Telephone Encounter (Signed)
Pt c/o medication issue:  1. Name of Medication: Ozempic or another weight loss medication   2. How are you currently taking this medication (dosage and times per day)? Not started yet  3. Are you having a reaction (difficulty breathing--STAT)? No  4. What is your medication issue? Patient's son is requesting call back to get update on this medication he says was discussed with his last appt.

## 2022-08-02 NOTE — Telephone Encounter (Signed)
Hey I see in your note you mentioned Mounjaro in his office visit, do you have any updates for him!

## 2022-08-03 ENCOUNTER — Telehealth: Payer: Self-pay | Admitting: Pharmacist Clinician (PhC)/ Clinical Pharmacy Specialist

## 2022-08-03 NOTE — Telephone Encounter (Signed)
PA for [facility] please   Pt with DM2, A1c has been 6.8, currently on metformin

## 2022-08-06 ENCOUNTER — Other Ambulatory Visit (HOSPITAL_COMMUNITY): Payer: Self-pay

## 2022-08-06 ENCOUNTER — Encounter: Payer: Self-pay | Admitting: Pharmacist Clinician (PhC)/ Clinical Pharmacy Specialist

## 2022-08-06 MED ORDER — MOUNJARO 5 MG/0.5ML ~~LOC~~ SOAJ: 5.0000 mg | SUBCUTANEOUS | 1 refills | Status: DC

## 2022-08-06 MED ORDER — MOUNJARO 10 MG/0.5ML ~~LOC~~ SOAJ: 10.0000 mg | SUBCUTANEOUS | 1 refills | Status: DC

## 2022-08-06 MED ORDER — MOUNJARO 2.5 MG/0.5ML ~~LOC~~ SOAJ: 2.5000 mg | SUBCUTANEOUS | 1 refills | Status: DC

## 2022-08-06 MED ORDER — MOUNJARO 7.5 MG/0.5ML ~~LOC~~ SOAJ: 7.5000 mg | SUBCUTANEOUS | 1 refills | Status: DC

## 2022-08-06 NOTE — Telephone Encounter (Signed)
Patient following up for updates on new medication

## 2022-08-06 NOTE — Telephone Encounter (Signed)
Per test claim, no PA required.

## 2022-08-07 NOTE — Telephone Encounter (Signed)
Rx sent 

## 2022-08-07 NOTE — Telephone Encounter (Signed)
Prescription sent to pharmacy. Patient notified

## 2022-08-13 ENCOUNTER — Ambulatory Visit (INDEPENDENT_AMBULATORY_CARE_PROVIDER_SITE_OTHER): Payer: Medicare PPO

## 2022-08-13 ENCOUNTER — Ambulatory Visit: Payer: Medicare PPO

## 2022-08-13 VITALS — Ht 68.0 in | Wt 291.0 lb

## 2022-08-13 DIAGNOSIS — Z0000 Encounter for general adult medical examination without abnormal findings: Secondary | ICD-10-CM

## 2022-08-13 NOTE — Patient Instructions (Addendum)
David Brandt , Thank you for taking time to come for your Medicare Wellness Visit. I appreciate your ongoing commitment to your health goals. Please review the following plan we discussed and let me know if I can assist you in the future.   These are the goals we discussed:  Goals       Patient Stated      I would like to get rid of my back pain so I can get back to the things I used to do.       Patient Stated      08/07/2021, wants to weigh 195-210 pounds      Patient stated (pt-stated)      I would like to walk a longer distance,        This is a list of the screening recommended for you and due dates:  Health Maintenance  Topic Date Due   Complete foot exam   Never done   DTaP/Tdap/Td vaccine (1 - Tdap) Never done   Eye exam for diabetics  08/13/2022*   Yearly kidney health urinalysis for diabetes  08/14/2022*   COVID-19 Vaccine (8 - 2023-24 season) 08/29/2022*   Screening for Lung Cancer  08/14/2023*   Hepatitis C Screening: USPSTF Recommendation to screen - Ages 18-79 yo.  08/14/2023*   Hemoglobin A1C  09/13/2022   Colon Cancer Screening  02/21/2023   Yearly kidney function blood test for diabetes  07/27/2023   Medicare Annual Wellness Visit  08/14/2023   Pneumonia Vaccine  Completed   Flu Shot  Completed   Zoster (Shingles) Vaccine  Completed   HPV Vaccine  Aged Out  *Topic was postponed. The date shown is not the original due date.   Opioid Pain Medicine Management Opioids are powerful medicines that are used to treat moderate to severe pain. When used for short periods of time, they can help you to: Sleep better. Do better in physical or occupational therapy. Feel better in the first few days after an injury. Recover from surgery. Opioids should be taken with the supervision of a trained health care provider. They should be taken for the shortest period of time possible. This is because opioids can be addictive, and the longer you take opioids, the greater your  risk of addiction. This addiction can also be called opioid use disorder. What are the risks? Using opioid pain medicines for longer than 3 days increases your risk of side effects. Side effects include: Constipation. Nausea and vomiting. Breathing difficulties (respiratory depression). Drowsiness. Confusion. Opioid use disorder. Itching. Taking opioid pain medicine for a long period of time can affect your ability to do daily tasks. It also puts you at risk for: Motor vehicle crashes. Depression. Suicide. Heart attack. Overdose, which can be life-threatening. What is a pain treatment plan? A pain treatment plan is an agreement between you and your health care provider. Pain is unique to each person, and treatments vary depending on your condition. To manage your pain, you and your health care provider need to work together. To help you do this: Discuss the goals of your treatment, including how much pain you might expect to have and how you will manage the pain. Review the risks and benefits of taking opioid medicines. Remember that a good treatment plan uses more than one approach and minimizes the chance of side effects. Be honest about the amount of medicines you take and about any drug or alcohol use. Get pain medicine prescriptions from only one health care provider.  Pain can be managed with many types of alternative treatments. Ask your health care provider to refer you to one or more specialists who can help you manage pain through: Physical or occupational therapy. Counseling (cognitive behavioral therapy). Good nutrition. Biofeedback. Massage. Meditation. Non-opioid medicine. Following a gentle exercise program. How to use opioid pain medicine Taking medicine Take your pain medicine exactly as told by your health care provider. Take it only when you need it. If your pain gets less severe, you may take less than your prescribed dose if your health care provider  approves. If you are not having pain, do nottake pain medicine unless your health care provider tells you to take it. If your pain is severe, do nottry to treat it yourself by taking more pills than instructed on your prescription. Contact your health care provider for help. Write down the times when you take your pain medicine. It is easy to become confused while on pain medicine. Writing the time can help you avoid overdose. Take other over-the-counter or prescription medicines only as told by your health care provider. Keeping yourself and others safe  While you are taking opioid pain medicine: Do not drive, use machinery, or power tools. Do not sign legal documents. Do not drink alcohol. Do not take sleeping pills. Do not supervise children by yourself. Do not do activities that require climbing or being in high places. Do not go to a lake, river, ocean, spa, or swimming pool. Do not share your pain medicine with anyone. Keep pain medicine in a locked cabinet or in a secure area where pets and children cannot reach it. Stopping your use of opioids If you have been taking opioid medicine for more than a few weeks, you may need to slowly decrease (taper) how much you take until you stop completely. Tapering your use of opioids can decrease your risk of symptoms of withdrawal, such as: Pain and cramping in the abdomen. Nausea. Sweating. Sleepiness. Restlessness. Uncontrollable shaking (tremors). Cravings for the medicine. Do not attempt to taper your use of opioids on your own. Talk with your health care provider about how to do this. Your health care provider may prescribe a step-down schedule based on how much medicine you are taking and how long you have been taking it. Getting rid of leftover pills Do not save any leftover pills. Get rid of leftover pills safely by: Taking the medicine to a prescription take-back program. This is usually offered by the county or law  enforcement. Bringing them to a pharmacy that has a drug disposal container. Flushing them down the toilet. Check the label or package insert of your medicine to see whether this is safe to do. Throwing them out in the trash. Check the label or package insert of your medicine to see whether this is safe to do. If it is safe to throw it out, remove the medicine from the original container, put it into a sealable bag or container, and mix it with used coffee grounds, food scraps, dirt, or cat litter before putting it in the trash. Follow these instructions at home: Activity Do exercises as told by your health care provider. Avoid activities that make your pain worse. Return to your normal activities as told by your health care provider. Ask your health care provider what activities are safe for you. General instructions You may need to take these actions to prevent or treat constipation: Drink enough fluid to keep your urine pale yellow. Take over-the-counter or prescription medicines.  Eat foods that are high in fiber, such as beans, whole grains, and fresh fruits and vegetables. Limit foods that are high in fat and processed sugars, such as fried or sweet foods. Keep all follow-up visits. This is important. Where to find support If you have been taking opioids for a long time, you may benefit from receiving support for quitting from a local support group or counselor. Ask your health care provider for a referral to these resources in your area. Where to find more information Centers for Disease Control and Prevention (CDC): http://www.wolf.info/ [place] Food and Drug Administration (FDA): [place] Get help right away if: You may have taken too much of an opioid (overdosed). Common symptoms of an overdose: Your breathing is slower or more shallow than normal. You have a very slow heartbeat (pulse). You have slurred speech. You have nausea and vomiting. Your pupils become very small. You have other  potential symptoms: You are very confused. You faint or feel like you will faint. You have cold, clammy skin. You have blue lips or fingernails. You have thoughts of harming yourself or harming others. These symptoms may represent a serious problem that is an emergency. Do not wait to see if the symptoms will go away. Get medical help right away. Call your local emergency services (911 in the [place] Do not drive yourself to the hospital.  If you ever feel like you may hurt yourself or others, or have thoughts about taking your own life, get help right away. Go to your nearest emergency department or: Call your local emergency services (911 in the [place] Call the [facility] 607-614-3001 in the [place] Call a suicide crisis helpline, such as the [facility] at (279)688-3018 or 988 in the [place]. This is open 24 hours a day in the [place] Text the Crisis Text Line at 769-188-2458 (in the [place].). Summary Opioid medicines can help you manage moderate to severe pain for a short period of time. A pain treatment plan is an agreement between you and your health care provider. Discuss the goals of your treatment, including how much pain you might expect to have and how you will manage the pain. If you think that you or someone else may have taken too much of an opioid, get medical help right away. This information is not intended to replace advice given to you by your health care provider. Make sure you discuss any questions you have with your health care provider. Document Revised: 01/04/2021 Document Reviewed: 09/21/2020 Elsevier Patient Education  [place] directives: Advance directive discussed with you today. Even though you declined this today, please call our office should you change your mind, and we can give you the proper paperwork for you to fill out.   Conditions/risks identified: None  Next appointment: Follow up in one year for your  annual wellness visit.    Preventive Care 74 Years and Older, Male  Preventive care refers to lifestyle choices and visits with your health care provider that can promote health and wellness. What does preventive care include? A yearly physical exam. This is also called an annual well check. Dental exams once or twice a year. Routine eye exams. Ask your health care provider how often you should have your eyes checked. Personal lifestyle choices, including: Daily care of your teeth and gums. Regular physical activity. Eating a healthy diet. Avoiding tobacco and drug use. Limiting alcohol use. Practicing safe sex. Taking low doses of aspirin every  day. Taking vitamin and mineral supplements as recommended by your health care provider. What happens during an annual well check? The services and screenings done by your health care provider during your annual well check will depend on your age, overall health, lifestyle risk factors, and family history of disease. Counseling  Your health care provider may ask you questions about your: Alcohol use. Tobacco use. Drug use. Emotional well-being. Home and relationship well-being. Sexual activity. Eating habits. History of falls. Memory and ability to understand (cognition). Work and work Statistician. Screening  You may have the following tests or measurements: Height, weight, and BMI. Blood pressure. Lipid and cholesterol levels. These may be checked every 5 years, or more frequently if you are over 46 years old. Skin check. Lung cancer screening. You may have this screening every year starting at age 46 if you have a 30-pack-year history of smoking and currently smoke or have quit within the past 15 years. Fecal occult blood test (FOBT) of the stool. You may have this test every year starting at age 20. Flexible sigmoidoscopy or colonoscopy. You may have a sigmoidoscopy every 5 years or a colonoscopy every 10 years starting at age  50. Prostate cancer screening. Recommendations will vary depending on your family history and other risks. Hepatitis C blood test. Hepatitis B blood test. Sexually transmitted disease (STD) testing. Diabetes screening. This is done by checking your blood sugar (glucose) after you have not eaten for a while (fasting). You may have this done every 1-3 years. Abdominal aortic aneurysm (AAA) screening. You may need this if you are a current or former smoker. Osteoporosis. You may be screened starting at age 1 if you are at high risk. Talk with your health care provider about your test results, treatment options, and if necessary, the need for more tests. Vaccines  Your health care provider may recommend certain vaccines, such as: Influenza vaccine. This is recommended every year. Tetanus, diphtheria, and acellular pertussis (Tdap, Td) vaccine. You may need a Td booster every 10 years. Zoster vaccine. You may need this after age 1. Pneumococcal 13-valent conjugate (PCV13) vaccine. One dose is recommended after age 77. Pneumococcal polysaccharide (PPSV23) vaccine. One dose is recommended after age 91. Talk to your health care provider about which screenings and vaccines you need and how often you need them. This information is not intended to replace advice given to you by your health care provider. Make sure you discuss any questions you have with your health care provider. Document Released: 07/08/2015 Document Revised: 02/29/2016 Document Reviewed: 04/12/2015 Elsevier Interactive Patient Education  2017 [place] Prevention in the Home Falls can cause injuries. They can happen to people of all ages. There are many things you can do to make your home safe and to help prevent falls. What can I do on the outside of my home? Regularly fix the edges of walkways and driveways and fix any cracks. Remove anything that might make you trip as you walk through a door, such as a raised step or  threshold. Trim any bushes or trees on the path to your home. Use bright outdoor lighting. Clear any walking paths of anything that might make someone trip, such as rocks or tools. Regularly check to see if handrails are loose or broken. Make sure that both sides of any steps have handrails. Any raised decks and porches should have guardrails on the edges. Have any leaves, snow, or ice cleared regularly. Use sand or salt on walking paths  during winter. Clean up any spills in your garage right away. This includes oil or grease spills. What can I do in the bathroom? Use night lights. Install grab bars by the toilet and in the tub and shower. Do not use towel bars as grab bars. Use non-skid mats or decals in the tub or shower. If you need to sit down in the shower, use a plastic, non-slip stool. Keep the floor dry. Clean up any water that spills on the floor as soon as it happens. Remove soap buildup in the tub or shower regularly. Attach bath mats securely with double-sided non-slip rug tape. Do not have throw rugs and other things on the floor that can make you trip. What can I do in the bedroom? Use night lights. Make sure that you have a light by your bed that is easy to reach. Do not use any sheets or blankets that are too big for your bed. They should not hang down onto the floor. Have a firm chair that has side arms. You can use this for support while you get dressed. Do not have throw rugs and other things on the floor that can make you trip. What can I do in the kitchen? Clean up any spills right away. Avoid walking on wet floors. Keep items that you use a lot in easy-to-reach places. If you need to reach something above you, use a strong step stool that has a grab bar. Keep electrical cords out of the way. Do not use floor polish or wax that makes floors slippery. If you must use wax, use non-skid floor wax. Do not have throw rugs and other things on the floor that can make you  trip. What can I do with my stairs? Do not leave any items on the stairs. Make sure that there are handrails on both sides of the stairs and use them. Fix handrails that are broken or loose. Make sure that handrails are as long as the stairways. Check any carpeting to make sure that it is firmly attached to the stairs. Fix any carpet that is loose or worn. Avoid having throw rugs at the top or bottom of the stairs. If you do have throw rugs, attach them to the floor with carpet tape. Make sure that you have a light switch at the top of the stairs and the bottom of the stairs. If you do not have them, ask someone to add them for you. What else can I do to help prevent falls? Wear shoes that: Do not have high heels. Have rubber bottoms. Are comfortable and fit you well. Are closed at the toe. Do not wear sandals. If you use a stepladder: Make sure that it is fully opened. Do not climb a closed stepladder. Make sure that both sides of the stepladder are locked into place. Ask someone to hold it for you, if possible. Clearly mark and make sure that you can see: Any grab bars or handrails. First and last steps. Where the edge of each step is. Use tools that help you move around (mobility aids) if they are needed. These include: Canes. Walkers. Scooters. Crutches. Turn on the lights when you go into a dark area. Replace any light bulbs as soon as they burn out. Set up your furniture so you have a clear path. Avoid moving your furniture around. If any of your floors are uneven, fix them. If there are any pets around you, be aware of where they are. Review your  medicines with your doctor. Some medicines can make you feel dizzy. This can increase your chance of falling. Ask your doctor what other things that you can do to help prevent falls. This information is not intended to replace advice given to you by your health care provider. Make sure you discuss any questions you have with your  health care provider. Document Released: 04/07/2009 Document Revised: 11/17/2015 Document Reviewed: 07/16/2014 Elsevier Interactive Patient Education  2017 [employer].

## 2022-08-13 NOTE — Progress Notes (Signed)
Subjective:   David Brandt is a 77 y.o. male who presents for Medicare Annual/Subsequent preventive examination.  Review of Systems    Virtual Visit via Telephone Note  I connected with  David Brandt on 08/13/22 at  3:15 PM EST by telephone and verified that I am speaking with the correct person using two identifiers.  Location: Patient: Home Provider: Office Persons participating in the virtual visit: patient/Nurse Health Advisor   I discussed the limitations, risks, security and privacy concerns of performing an evaluation and management service by telephone and the availability of in person appointments. The patient expressed understanding and agreed to proceed.  Interactive audio and video telecommunications were attempted between this nurse and patient, however failed, due to patient having technical difficulties OR patient did not have access to video capability.  We continued and completed visit with audio only.  Some vital signs may be absent or patient reported.   David Peaches, LPN  Cardiac Risk Factors include: advanced age (>31mn, >>35women);diabetes mellitus;male gender;hypertension     Objective:    Today's Vitals   08/13/22 1612  Weight: 291 lb (132 kg)  Height: 5' 8"$  (1.727 m)   Body mass index is 44.25 kg/m.     08/13/2022    4:21 PM 02/09/2022    7:03 AM 09/14/2021   12:31 PM 08/07/2021    3:20 PM 06/08/2021    9:02 AM 02/06/2021    9:05 AM 10/24/2020    9:34 AM  Advanced Directives  Does Patient Have a Medical Advance Directive? No No No No No No No  Would patient like information on creating a medical advance directive? No - Patient declined No - Patient declined No - Patient declined  Yes (MAU/Ambulatory/Procedural Areas - Information given) No - Patient declined No - Patient declined    Current Medications (verified) Outpatient Encounter Medications as of 08/13/2022  Medication Sig   albuterol (VENTOLIN HFA) 108 (90 Base) MCG/ACT  inhaler Inhale 2 puffs into the lungs every 4 (four) hours as needed for wheezing or shortness of breath.   Alcohol Swabs (DROPSAFE ALCOHOL PREP) 70 % PADS USE AS DIRECTED   amoxicillin (AMOXIL) 875 MG tablet Take 1 tablet (875 mg total) by mouth 2 (two) times daily.   Apoaequorin (PREVAGEN PO) Take 20 mg by mouth.   aspirin EC 81 MG tablet Take 81 mg by mouth daily.   atorvastatin (LIPITOR) 80 MG tablet Take 1 tablet (80 mg total) by mouth daily.   budesonide-formoterol (SYMBICORT) 160-4.5 MCG/ACT inhaler TAKE 2 PUFFS BY MOUTH TWICE A DAY   celecoxib (CELEBREX) 200 MG capsule Take 1 capsule (200 mg total) by mouth 2 (two) times daily.   Cholecalciferol (VITAMIN D3) 25 MCG (1000 UT) CAPS Take 2,000 Units by mouth daily.   felodipine (PLENDIL) 10 MG 24 hr tablet Take 1 tablet (10 mg total) by mouth daily.   gabapentin (NEURONTIN) 300 MG capsule TAKE 1 CAPSULE BY MOUTH THREE TIMES A DAY   hydrALAZINE (APRESOLINE) 50 MG tablet TAKE 1 TABLET TWICE DAILY   hydrochlorothiazide (HYDRODIURIL) 25 MG tablet TAKE 1 TABLET EVERY DAY (NEED MD APPOINTMENT FOR REFILLS)   lidocaine (LIDODERM) 5 % Place 1 patch onto the skin daily. Remove & Discard patch within 12 hours or as directed by MD   metFORMIN (GLUCOPHAGE-XR) 500 MG 24 hr tablet TAKE 1 TABLET EVERY DAY   metoprolol succinate (TOPROL XL) 25 MG 24 hr tablet Take 1 tablet (25 mg total) by mouth daily.  OVER THE COUNTER MEDICATION Take 2 capsules by mouth 2 (two) times daily. OmegaXL   oxyCODONE-acetaminophen (PERCOCET/ROXICET) 5-325 MG tablet Take 1 tablet by mouth 4 (four) times daily as needed.   POTASSIUM CITRATE PO Take 15 mEq by mouth.   sertraline (ZOLOFT) 50 MG tablet TAKE 1 TABLET EVERY DAY   spironolactone (ALDACTONE) 25 MG tablet TAKE 1 TABLET EVERY DAY   tirzepatide (MOUNJARO) 10 MG/0.5ML Pen Inject 10 mg into the skin once a week.   tirzepatide [facility]) 2.5 MG/0.5ML Pen Inject 2.5 mg into the skin once a week.   tirzepatide [facility]) 5  MG/0.5ML Pen Inject 5 mg into the skin once a week.   tirzepatide (MOUNJARO) 7.5 MG/0.5ML Pen Inject 7.5 mg into the skin once a week.   tiZANidine (ZANAFLEX) 2 MG tablet Take 2 mg by mouth 2 (two) times daily as needed.   TRUE METRIX BLOOD GLUCOSE TEST test strip TEST BLOOD SUGAR AS DIRECTED   No facility-administered encounter medications on file as of 08/13/2022.    Allergies (verified) Other, Yellow jacket venom, Allopurinol, Justicia adhatoda (malabar nut tree) [justicia adhatoda], Valsartan-hydrochlorothiazide, and Sulfa antibiotics   History: Past Medical History:  Diagnosis Date   Abnormal cardiovascular stress test 03/29/2016   Allergic rhinitis    Allergy    Arthritis    Asthma    Back pain    Diabetes ([place]) 03/29/2016   Gout    History of kidney stones    Hyperlipidemia    Hypertension    Joint pain    Multiple food allergies    Prediabetes    Sleep apnea    cpap   Past Surgical History:  Procedure Laterality Date   CARDIAC CATHETERIZATION N/A 03/29/2016   Procedure: Right/Left Heart Cath and Coronary Angiography;  Surgeon: Nelva Bush, MD;  Location: [place] [facility];  Service: Cardiovascular;  Laterality: N/A;   CARDIAC CATHETERIZATION N/A 03/29/2016   Procedure: Intravascular Pressure Wire/FFR Study;  Surgeon: Nelva Bush, MD;  Location: [place] [facility];  Service: Cardiovascular;  Laterality: N/A;   CARDIAC CATHETERIZATION Left 04/02/2016   Procedure: FEMORAL ARTERIAL LINE INSERTION;  Surgeon: [name], MD;  Location: [place];  Service: Open Heart Surgery;  Laterality: Left;   COLONOSCOPY  02/20/2018   per Dr. 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   Procedure: CORONARY ARTERY BYPASS GRAFTING (CABG) X 5 UTILIZING LEFT INTERNAL MAMMARY ARTERY AND ENDOSCOPICALLY HARVESTED GREATER SAPHENOUS VEIN ; Mammary to LAD, Sequencial 1st to 2nd Diagonal, Vein to OM, Vein to Distal RCA.;   Surgeon: [name], MD;  Location: [place];  Service: Open Heart Surgery;  Laterality: N/A;   CYSTOSCOPY/URETEROSCOPY/HOLMIUM LASER/STENT PLACEMENT Bilateral 05/07/2019   Procedure: LEFT URETEROSCOPY/HOLMIUM LASER/STENT PLACEMENT/BIOPSY LEFT MID URETER/RIGHT RETROGRADE STENT PLACEMENT ;  Surgeon: [name], MD;  Location: [facility];  Service: Urology;  Laterality: Bilateral;   CYSTOSCOPY/URETEROSCOPY/HOLMIUM LASER/STENT PLACEMENT Bilateral 05/27/2019   Procedure: CYSTOSCOPY, URETEROSCOPY/ RETROGRADE PYELOGRAM/ HOLMIUM LASER/STENT EXCHANGE;  Surgeon: [name], MD;  Location: [facility];  Service: Urology;  Laterality: Bilateral;   LYMPH NODE BIOPSY Left 04/02/2016   Procedure: INCIDENTAL LEFT MAMMARY LYMPH NODE BIOPSY;  Surgeon: [name], MD;  Location: [place];  Service: Open Heart Surgery;  Laterality: Left;   mass abdomen  1955   NEPHROLITHOTOMY Left 04/24/2019   Procedure: NEPHROLITHOTOMY PERCUTANEOUS WITH SURGEON ACCESS;  Surgeon: [name], MD;  Location: [facility];  Service: Urology;  Laterality: Left;   TEE WITHOUT CARDIOVERSION N/A 04/02/2016   Procedure: TRANSESOPHAGEAL ECHOCARDIOGRAM (TEE);  Surgeon: [name], MD;  Location: [place];  Service: Open Heart Surgery;  Laterality: N/A;   Family History  Problem Relation Age of Onset   Colon cancer Father 72   Cancer Father    Alcohol abuse Father    Stroke Mother    Diabetes Mother    Hypertension Mother    Hyperlipidemia Mother    Obesity Mother    Heart disease Maternal Aunt    Diabetes Maternal Uncle    Stomach cancer Neg Hx    Esophageal cancer Neg Hx    Rectal cancer Neg Hx    Social History   Socioeconomic History   Marital status: Married    Spouse name: Hassan Rowan   Number of children: Y   Years of education: Not on file   Highest education level: Not on file  Occupational History   Not on file  Tobacco Use   Smoking status: Former    Packs/day: 1.00    Years: 47.00    Total  pack years: 47.00    Types: Cigarettes    Quit date: 06/25/2018    Years since quitting: 4.1    Passive exposure: Past   Smokeless tobacco: Never  Vaping Use   Vaping Use: Never used  Substance and Sexual Activity   Alcohol use: Not Currently    Comment: occaional shot of vodka   Drug use: Yes    Frequency: 7.0 times per week    Types: Marijuana    Comment: marijuana-states still smoking 10/19/2020   Sexual activity: Not on file  Other Topics Concern   Not on file  Social History Narrative   Not on file   Social Determinants of Health   Financial Resource Strain: Low Risk  (08/13/2022)   Overall Financial Resource Strain (CARDIA)    Difficulty of Paying Living Expenses: Not hard at all  Food Insecurity: No Food Insecurity (08/13/2022)   Hunger Vital Sign    Worried About Running Out of Food in the Last Year: Never true    Ran Out of Food in the Last Year: Never true  Transportation Needs: No Transportation Needs (08/13/2022)   PRAPARE - Hydrologist (Medical): No    Lack of Transportation (Non-Medical): No  Physical Activity: Insufficiently Active (08/13/2022)   Exercise Vital Sign    Days of Exercise per Week: 2 days    Minutes of Exercise per Session: 20 min  Stress: No Stress Concern Present (08/13/2022)   [place]    Feeling of Stress : Not at all  Social Connections: Moderately Isolated (08/13/2022)   Social Connection and Isolation Panel [NHANES]    Frequency of Communication with Friends and Family: More than three times a week    Frequency of Social Gatherings with Friends and Family: More than three times a week    Attends Religious Services: Never    Marine scientist or Organizations: No    Attends Music therapist: Never    Marital Status: Married    Tobacco Counseling Counseling given: Not Answered   Clinical Intake:  Pre-visit preparation  completed: Yes  Pain : No/denies pain   Nutrition Risk Assessment:  Has the patient had any N/V/D within the last 2 months?  No  Does the patient have any non-healing wounds?  No  Has  the patient had any unintentional weight loss or weight gain?  No   Diabetes:  Is the patient diabetic?  Yes  If diabetic, was a CBG obtained today?  No  Did the patient bring in their glucometer from home?  No  How often do you monitor your CBG's? PRN.   Financial Strains and Diabetes Management:  Are you having any financial strains with the device, your supplies or your medication? No .  Does the patient want to be seen by Chronic Care Management for management of their diabetes?  No  Would the patient like to be referred to a Nutritionist or for Diabetic Management?  No   Diabetic Exams:  Diabetic Eye Exam: Completed No. Overdue for diabetic eye exam. Pt has been advised about the importance in completing this exam. A referral has been placed today. Message sent to referral coordinator for scheduling purposes. Advised pt to expect a call from office referred to regarding appt.  Diabetic Foot Exam: Completed No. Pt has been advised about the importance in completing this exam. Pt is scheduled for diabetic foot exam on Followed by PCP.    BMI - recorded: 44.25 Nutritional Status: BMI > 30  Obese Nutritional Risks: None Diabetes: No  How often do you need to have someone help you when you read instructions, pamphlets, or other written materials from your doctor or pharmacy?: 1 - Never  Diabetic?  Yes  Interpreter Needed?: No  Information entered by :: Rolene Arbour LPN   Activities of Daily Living    08/13/2022    4:19 PM  In your present state of health, do you have any difficulty performing the following activities:  Hearing? 0  Vision? 0  Difficulty concentrating or making decisions? 0  Walking or climbing stairs? 0  Dressing or bathing? 0  Doing errands, shopping? 0  Preparing  Food and eating ? N  Using the Toilet? N  In the past six months, have you accidently leaked urine? N  Do you have problems with loss of bowel control? N  Managing your Medications? N  Managing your Finances? N  Housekeeping or managing your Housekeeping? N    Patient Care Team: [name], MD as PCP - General (Family Medicine) Skeet Latch, MD as PCP - Cardiology (Cardiology) Clark-Burning, Anderson Malta, PA-C (Inactive) (Dermatology)  Indicate any recent Medical Services you may have received from other than [facility] providers in the past year (date may be approximate).     Assessment:   This is a routine wellness examination for [place].  Hearing/Vision screen Hearing Screening - Comments:: Denies hearing difficulties   Vision Screening - Comments:: Wears rx glasses - up to date with routine eye exams with  [facility]  Dietary issues and exercise activities discussed: Current Exercise Habits: Home exercise routine, Type of exercise: walking, Time (Minutes): 20, Frequency (Times/Week): 3, Weekly Exercise (Minutes/Week): 60, Intensity: Mild, Exercise limited by: None identified   Goals Addressed               This Visit's Progress     Patient stated (pt-stated)        I would like to walk a longer distance,       Depression Screen    08/13/2022    4:17 PM 03/14/2022    2:54 PM 08/18/2021    1:21 PM 08/07/2021    3:21 PM 08/31/2020   10:50 AM 07/25/2020   10:39 AM 06/03/2020    1:32 PM  PHQ 2/9 Scores  PHQ - 2 Score 0 0 0 0 0 0 0  PHQ- 9 Score  0 3  0 7     Fall Risk    08/13/2022    4:20 PM 03/14/2022    2:54 PM 08/18/2021    1:20 PM 08/07/2021    3:21 PM 08/31/2020   10:49 AM  Fall Risk   Falls in the past year? 0 0 0 0 0  Number falls in past yr: 0 0 0  0  Injury with Fall? 0 0 0  0  Risk for fall due to : No Fall Risks No Fall Risks No Fall Risks Medication side effect Orthopedic patient  Follow up Falls prevention discussed Falls evaluation completed  Falls evaluation completed Falls evaluation completed;Education provided;Falls prevention discussed Falls evaluation completed;Falls prevention discussed    FALL RISK PREVENTION PERTAINING TO THE HOME:  Any stairs in or around the home? Yes No If so, are there any without handrails?  Home free of loose throw rugs in walkways, pet beds, electrical cords, etc? No  Adequate lighting in your home to reduce risk of falls? No   ASSISTIVE DEVICES UTILIZED TO PREVENT FALLS:  Life alert? No  Use of a cane, walker or w/c? No  Grab bars in the bathroom? No  Shower chair or bench in shower? No  Elevated toilet seat or a handicapped toilet? Yes  TIMED UP AND GO:  Was the test performed? No . Audio Visit  Cognitive Function:        08/13/2022    4:21 PM 08/07/2021    3:22 PM  6CIT Screen  What Year? 0 points 0 points  What month? 0 points 0 points  What time? 0 points 0 points  Count back from 20 0 points 0 points  Months in reverse 0 points 4 points  Repeat phrase 0 points 0 points  Total Score 0 points 4 points    Immunizations Immunization History  Administered Date(s) Administered   Influenza Inj Mdck Quad Pf 03/12/2022   Influenza Split 04/06/2015   Influenza, High Dose Seasonal PF 03/06/2018, 02/15/2019, 02/15/2019, 03/23/2021   Influenza,inj,Quad PF,6+ Mos 05/25/2013   Influenza-Unspecified 03/25/2014, 02/23/2020, 03/08/2020   Moderna Sars-Covid-2 Vaccination 08/12/2019, 08/31/2019, 03/08/2020   PFIZER Comirnaty(Gray Top)Covid-19 Tri-Sucrose Vaccine 02/23/2020, 11/28/2020   PFIZER(Purple Top)SARS-COV-2 Vaccination 02/23/2020, 03/23/2021   Pneumococcal Conjugate-13 04/28/2019   Pneumococcal Polysaccharide-23 04/11/2016   Zoster Recombinat (Shingrix) 01/21/2017, 03/26/2017    TDAP status: Due, Education has been provided regarding the importance of this vaccine. Advised may receive this vaccine at local pharmacy or Health Dept. Aware to provide a copy of the vaccination  record if obtained from local pharmacy or Health Dept. Verbalized acceptance and understanding.  Flu Vaccine status: Up to date  Pneumococcal vaccine status: Up to date  Covid-19 vaccine status: Completed vaccines  Qualifies for Shingles Vaccine? Yes   Zostavax completed Yes   Shingrix Completed?: Yes  Screening Tests Health Maintenance  Topic Date Due   FOOT EXAM  Never done   DTaP/Tdap/Td (1 - Tdap) Never done   OPHTHALMOLOGY EXAM  08/13/2022 (Originally 11/22/2021)   Diabetic kidney evaluation - Urine ACR  08/14/2022 (Originally 07/25/2021)   COVID-19 Vaccine (8 - 2023-24 season) 08/29/2022 (Originally 02/23/2022)   Lung Cancer Screening  08/14/2023 (Originally 10/19/2021)   Hepatitis C Screening  08/14/2023 (Originally 09/13/1963)   HEMOGLOBIN A1C  09/13/2022   COLONOSCOPY (Pts 45-23yr Insurance coverage will need to be confirmed)  02/21/2023   Diabetic kidney evaluation -  eGFR measurement  07/27/2023   Medicare Annual Wellness (AWV)  08/14/2023   Pneumonia Vaccine 47+ Years old  Completed   INFLUENZA VACCINE  Completed   Zoster Vaccines- Shingrix  Completed   HPV VACCINES  Aged Out    Health Maintenance  Health Maintenance Due  Topic Date Due   FOOT EXAM  Never done   DTaP/Tdap/Td (1 - Tdap) Never done    Colorectal cancer screening: Type of screening: Colonoscopy. Completed 02/20/18. Repeat every   years  Lung Cancer Screening: (Low Dose CT Chest recommended if Age 1-80 years, 30 pack-year currently smoking OR have quit w/in 15years.) does qualify.   Lung Cancer Screening Referral: Deferred  Additional Screening:  Hepatitis C Screening: does qualify;  Deferred  Vision Screening: Recommended annual ophthalmology exams for early detection of glaucoma and other disorders of the eye. Is the patient up to date with their annual eye exam?  Yes  Who is the provider or what is the name of the office in which the patient attends annual eye exams? [facility] If pt is  not established with a provider, would they like to be referred to a provider to establish care? No .   Dental Screening: Recommended annual dental exams for proper oral hygiene  Community Resource Referral / Chronic Care Management:  CRR required this visit?  No   CCM required this visit?  No      Plan:     I have personally reviewed and noted the following in the patient's chart:   Medical and social history Use of alcohol, tobacco or illicit drugs  Current medications and supplements including opioid prescriptions. Patient is currently taking opioids Functional ability and status Nutritional status Physical activity Advanced directives List of other physicians Hospitalizations, surgeries, and ER visits in previous 12 months Vitals Screenings to include cognitive, depression, and falls Referrals and appointments  In addition, I have reviewed and discussed with patient certain preventive protocols, quality metrics, and best practice recommendations. A written personalized care plan for preventive services as well as general preventive health recommendations were provided to patient.     David Peaches, LPN   [id number]   Nurse Notes:Patient due Hep-C Screening and Diabetic kidney evaluation- Urine ACR

## 2022-08-17 ENCOUNTER — Institutional Professional Consult (permissible substitution) (HOSPITAL_BASED_OUTPATIENT_CLINIC_OR_DEPARTMENT_OTHER): Payer: Medicare PPO | Admitting: Pulmonary Disease

## 2022-08-22 DIAGNOSIS — Z6841 Body Mass Index (BMI) 40.0 and over, adult: Secondary | ICD-10-CM | POA: Diagnosis not present

## 2022-08-22 DIAGNOSIS — M479 Spondylosis, unspecified: Secondary | ICD-10-CM | POA: Diagnosis not present

## 2022-08-22 DIAGNOSIS — M5136 Other intervertebral disc degeneration, lumbar region: Secondary | ICD-10-CM | POA: Diagnosis not present

## 2022-08-25 ENCOUNTER — Other Ambulatory Visit: Payer: Self-pay | Admitting: Family Medicine

## 2022-08-27 ENCOUNTER — Encounter: Payer: Self-pay | Admitting: Pharmacist Clinician (PhC)/ Clinical Pharmacy Specialist

## 2022-08-29 ENCOUNTER — Encounter (HOSPITAL_BASED_OUTPATIENT_CLINIC_OR_DEPARTMENT_OTHER): Payer: Self-pay | Admitting: Pulmonary Disease

## 2022-08-30 ENCOUNTER — Other Ambulatory Visit: Payer: Self-pay | Admitting: Family Medicine

## 2022-08-30 ENCOUNTER — Ambulatory Visit (INDEPENDENT_AMBULATORY_CARE_PROVIDER_SITE_OTHER): Payer: Medicare PPO | Admitting: Family Medicine

## 2022-08-30 ENCOUNTER — Encounter: Payer: Self-pay | Admitting: Family Medicine

## 2022-08-30 VITALS — BP 114/62 | HR 77 | Temp 97.8°F | Wt 292.0 lb

## 2022-08-30 DIAGNOSIS — R590 Localized enlarged lymph nodes: Secondary | ICD-10-CM

## 2022-08-30 DIAGNOSIS — F419 Anxiety disorder, unspecified: Secondary | ICD-10-CM

## 2022-08-30 LAB — BASIC METABOLIC PANEL
BUN: 22 mg/dL (ref 6–23)
CO2: 28 mEq/L (ref 19–32)
Calcium: 9.5 mg/dL (ref 8.4–10.5)
Chloride: 99 mEq/L (ref 96–112)
Creatinine, Ser: 1 mg/dL (ref 0.40–1.50)
GFR: 73.91 mL/min (ref >60.00)
Glucose, Bld: 87 mg/dL (ref 70–99)
Potassium: 4 mEq/L (ref 3.5–5.1)
Sodium: 135 mEq/L (ref 135–145)

## 2022-08-30 LAB — CBC WITH DIFFERENTIAL/PLATELET
Basophils Absolute: 0.1 10*3/uL (ref 0.0–0.1)
Basophils Relative: 0.6 % (ref 0.0–3.0)
Eosinophils Absolute: 0.4 10*3/uL (ref 0.0–0.7)
Eosinophils Relative: 3.2 % (ref 0.0–5.0)
HCT: 49.5 % (ref 39.0–52.0)
Hemoglobin: 16 g/dL (ref 13.0–17.0)
Lymphocytes Relative: 16.3 % (ref 12.0–46.0)
Lymphs Abs: 2 10*3/uL (ref 0.7–4.0)
MCHC: 32.3 g/dL (ref 30.0–36.0)
MCV: 89.1 fl (ref 78.0–100.0)
Monocytes Absolute: 1.3 10*3/uL — ABNORMAL HIGH (ref 0.1–1.0)
Monocytes Relative: 10.9 % (ref 3.0–12.0)
Neutro Abs: 8.6 10*3/uL — ABNORMAL HIGH (ref 1.4–7.7)
Neutrophils Relative %: 69 % (ref 43.0–77.0)
Platelets: 246 10*3/uL (ref 150.0–400.0)
RBC: 5.56 Mil/uL (ref 4.22–5.81)
RDW: 15.7 % — ABNORMAL HIGH (ref 11.5–15.5)
WBC: 12.4 10*3/uL — ABNORMAL HIGH (ref 4.0–10.5)

## 2022-08-30 NOTE — Progress Notes (Signed)
   Subjective:    Patient ID: David Brandt, male    DOB: 02-02-1946, 77 y.o.   MRN: [id number]  HPI Here for the appearance of a non-painful lump in the right neck about 3 days ago. He denies any ST or mouth issues. We saw him for a similar but smaller lump last fall, and this went away on its own after a week or so. He had all his teeth removed years ago, and he wears dentures. He says these are comfortable. No recent fevers or URI symptoms. Of note, his WBC count on 04-19-22 was 8.6, but this rose to 12.7 on 04-21-23 and then was 13.4 on 05-02-23. These were drawn at [facility] around the time that he had a lumbar laminectomy surgery.    Review of Systems  Constitutional: Negative.   HENT:  Negative for congestion, ear pain, mouth sores, postnasal drip, sinus pressure, sore throat, trouble swallowing and voice change.   Eyes: Negative.   Respiratory: Negative.    Cardiovascular: Negative.        Objective:   Physical Exam Constitutional:      Appearance: Normal appearance. He is not ill-appearing.  HENT:     Head: Normocephalic and atraumatic.     Right Ear: Tympanic membrane, ear canal and external ear normal.     Left Ear: Tympanic membrane, ear canal and external ear normal.     Nose: Nose normal.     Mouth/Throat:     Pharynx: Oropharynx is clear.  Eyes:     Conjunctiva/sclera: Conjunctivae normal.  Neck:     Comments: There is a 2 cm round firm non-tender lump in the right anterior neck just below the mandible.  Musculoskeletal:     Cervical back: Neck supple. No tenderness.  Neurological:     Mental Status: He is alert.           Assessment & Plan:  Neck mass, most likely either a lymph node or a swollen salivary gland. We will check a CBC and BMET today. Set up a contrasted neck CT soon to evaluate this further.  Alysia Penna, MD

## 2022-09-13 ENCOUNTER — Encounter (HOSPITAL_BASED_OUTPATIENT_CLINIC_OR_DEPARTMENT_OTHER): Payer: Self-pay | Admitting: Pulmonary Disease

## 2022-09-13 ENCOUNTER — Ambulatory Visit (INDEPENDENT_AMBULATORY_CARE_PROVIDER_SITE_OTHER): Payer: Medicare PPO | Admitting: Pulmonary Disease

## 2022-09-13 ENCOUNTER — Telehealth: Payer: Self-pay | Admitting: Pulmonary Disease

## 2022-09-13 VITALS — BP 130/60 | HR 75 | Ht 68.0 in | Wt 287.8 lb

## 2022-09-13 DIAGNOSIS — G4733 Obstructive sleep apnea (adult) (pediatric): Secondary | ICD-10-CM

## 2022-09-13 DIAGNOSIS — J449 Chronic obstructive pulmonary disease, unspecified: Secondary | ICD-10-CM | POA: Diagnosis not present

## 2022-09-13 NOTE — Telephone Encounter (Signed)
PT said Dr. Johnette Abraham wanted to know what his CPAP settings were. All he can see is 23.

## 2022-09-13 NOTE — Progress Notes (Signed)
Subjective:   PATIENT ID: David Brandt GENDER: male DOB: 03-Mar-1946, MRN: [id number]  Chief Complaint  Patient presents with   Consult    Short of breath not sure who he sees for cpap compliance    Reason for Visit: New consult for COPD, needs new CPAP  [name] is a 77 year old male CAD s/p CABG, OSA on CPAP, HTN, HLD who presents for evaluation for COPD and OSA.  He reports unchanged shortness with exertion including walking up the driveway for the last 2-3 years. Denies cough or wheezing. Since his back surgery 03/2022 he doesn't walk anymore due to the nerve pain in his legs, when he previously walked in the park, he used to go daily daily. Denies frequent exacerbations. Denies steroids or antibiotics for respiratory issues in > 5 years. Intermittently takes Symbicort 4-5 times a week.  He was diagnosed with OSA >10 years ago and has been on CPAP nightly for 8-9 hours. Feels this improves quality of sleep. He reports weight gain 20-30 lbs in the last few years. Has not felt like his symptoms of fatigue or sleepiness have recurred or worsened.  Social History: Former smoker. Quit 2020. Smokes 1 joint daily.  I have personally reviewed patient's past medical/family/social history, allergies, current medications.  Past Medical History:  Diagnosis Date   Abnormal cardiovascular stress test 03/29/2016   Allergic rhinitis    Allergy    Arthritis    Asthma    Back pain    Diabetes ([place]) 03/29/2016   Gout    History of kidney stones    Hyperlipidemia    Hypertension    Joint pain    Multiple food allergies    Prediabetes    Sleep apnea    cpap     Family History  Problem Relation Age of Onset   Colon cancer Father 62   Cancer Father    Alcohol abuse Father    Stroke Mother    Diabetes Mother    Hypertension Mother    Hyperlipidemia Mother    Obesity Mother    Heart disease Maternal Aunt    Diabetes Maternal Uncle    Stomach cancer Neg Hx     Esophageal cancer Neg Hx    Rectal cancer Neg Hx      Social History   Occupational History   Not on file  Tobacco Use   Smoking status: Former    Packs/day: 1.00    Years: 47.00    Additional pack years: 0.00    Total pack years: 47.00    Types: Cigarettes    Quit date: 06/25/2018    Years since quitting: 4.2    Passive exposure: Past   Smokeless tobacco: Never   Tobacco comments:    Started smoking at age 33 smoked pack a day on average   Vaping Use   Vaping Use: Never used  Substance and Sexual Activity   Alcohol use: Not Currently    Comment: occaional shot of vodka   Drug use: Yes    Frequency: 7.0 times per week    Types: Marijuana    Comment: marijuana-states still smoking 10/19/2020   Sexual activity: Not on file    Allergies  Allergen Reactions   Other Hives   Yellow Jacket Venom Anaphylaxis   Allopurinol     Joint pain    Justicia Adhatoda ([place]abar Nut Tree) [Justicia Adhatoda]     Allergic to Walnuts, hickory nuts, and almonds   Valsartan-Hydrochlorothiazide  Lip swelling    Sulfa Antibiotics Hives     Outpatient Medications Prior to Visit  Medication Sig Dispense Refill   albuterol (VENTOLIN HFA) 108 (90 Base) MCG/ACT inhaler Inhale 2 puffs into the lungs every 4 (four) hours as needed for wheezing or shortness of breath. 8 g 5   Alcohol Swabs (DROPSAFE ALCOHOL PREP) 70 % PADS USE AS DIRECTED 300 each 0   Apoaequorin (PREVAGEN PO) Take 20 mg by mouth.     aspirin EC 81 MG tablet Take 81 mg by mouth daily.     atorvastatin (LIPITOR) 80 MG tablet Take 1 tablet (80 mg total) by mouth daily. 90 tablet 2   budesonide-formoterol (SYMBICORT) 160-4.5 MCG/ACT inhaler TAKE 2 PUFFS BY MOUTH TWICE A DAY 30.6 each 0   celecoxib (CELEBREX) 200 MG capsule Take 1 capsule (200 mg total) by mouth 2 (two) times daily. 180 capsule 3   Cholecalciferol (VITAMIN D3) 25 MCG (1000 UT) CAPS Take 2,000 Units by mouth daily.     felodipine (PLENDIL) 10 MG 24 hr tablet Take 1  tablet (10 mg total) by mouth daily. 90 tablet 1   gabapentin (NEURONTIN) 300 MG capsule TAKE 1 CAPSULE BY MOUTH THREE TIMES A DAY 90 capsule 1   hydrALAZINE (APRESOLINE) 50 MG tablet TAKE 1 TABLET TWICE DAILY 180 tablet 3   hydrochlorothiazide (HYDRODIURIL) 25 MG tablet TAKE 1 TABLET EVERY DAY (NEED MD APPOINTMENT FOR REFILLS) 60 tablet 6   metFORMIN (GLUCOPHAGE-XR) 500 MG 24 hr tablet TAKE 1 TABLET EVERY DAY 90 tablet 3   metoprolol succinate (TOPROL XL) 25 MG 24 hr tablet Take 1 tablet (25 mg total) by mouth daily. 90 tablet 3   POTASSIUM CITRATE PO Take 15 mEq by mouth.     sertraline (ZOLOFT) 50 MG tablet TAKE 2 TABLETS BY MOUTH EVERY DAY 180 tablet 1   spironolactone (ALDACTONE) 25 MG tablet TAKE 1 TABLET EVERY DAY 90 tablet 3   tirzepatide (MOUNJARO) 5 MG/0.5ML Pen Inject 5 mg into the skin once a week. 2 mL 1   tiZANidine (ZANAFLEX) 2 MG tablet Take 2 mg by mouth 2 (two) times daily as needed.     TRUE METRIX BLOOD GLUCOSE TEST test strip TEST BLOOD SUGAR AS DIRECTED 300 strip 0   lidocaine (LIDODERM) 5 % Place 1 patch onto the skin daily. Remove & Discard patch within 12 hours or as directed by MD (Patient not taking: Reported on 09/13/2022) 30 patch 0   OVER THE COUNTER MEDICATION Take 2 capsules by mouth 2 (two) times daily. OmegaXL (Patient not taking: Reported on 09/13/2022)     oxyCODONE-acetaminophen (PERCOCET/ROXICET) 5-325 MG tablet Take 1 tablet by mouth 4 (four) times daily as needed.     tirzepatide (MOUNJARO) 10 MG/0.5ML Pen Inject 10 mg into the skin once a week. (Patient not taking: Reported on 09/13/2022) 2 mL 1   tirzepatide (MOUNJARO) 2.5 MG/0.5ML Pen Inject 2.5 mg into the skin once a week. (Patient not taking: Reported on 09/13/2022) 2 mL 1   tirzepatide (MOUNJARO) 7.5 MG/0.5ML Pen Inject 7.5 mg into the skin once a week. (Patient not taking: Reported on 09/13/2022) 2 mL 1   No facility-administered medications prior to visit.    Review of Systems  Constitutional:   Negative for chills, diaphoresis, fever, malaise/fatigue and weight loss.  HENT:  Negative for congestion.   Respiratory:  Positive for shortness of breath. Negative for cough, hemoptysis, sputum production and wheezing.   Cardiovascular:  Negative for chest pain,  palpitations and leg swelling.    Objective:   Vitals:   09/13/22 1302  BP: 130/60  Pulse: 75  SpO2: 98%  Weight: 287 lb 13 oz (130.6 kg)  Height: 5\' 8"  (1.727 m)   SpO2: 98 % O2 Device: None (Room air)  Physical Exam: General: Well-appearing, no acute distress HENT: [place], AT Eyes: EOMI, no scleral icterus Respiratory: Clear to auscultation bilaterally.  No crackles, wheezing or rales Cardiovascular: RRR, -M/R/G, no JVD Extremities:-Edema,-tenderness Neuro: AAO x4, CNII-XII grossly intact Psych: Normal mood, normal affect  Data Reviewed:  Imaging: CT lung screen 10/19/20 - Mild paraseptal and centrilobular emphysema.  PFT: 03/30/26 FVC 2.26 (63%) FEV1 1.57 (58%) Ratio 69  TLC 95% RV 160% RV/TLC 166% DLCO 83% Interpretation: Moderately severe obstructive defect with air trapping. Normal DLCO  Labs: CBC    Component Value Date/Time   WBC 12.4 (H) 08/30/2022 1029   RBC 5.56 08/30/2022 1029   HGB 16.0 08/30/2022 1029   HGB 20.1 (HH) 07/27/2020 1056   HCT 49.5 08/30/2022 1029   HCT 59.9 (H) 07/27/2020 1056   PLT 246.0 08/30/2022 1029   PLT 195 07/27/2020 1056   MCV 89.1 08/30/2022 1029   MCV 92 07/27/2020 1056   MCH 30.4 05/01/2022 1300   MCHC 32.3 08/30/2022 1029   RDW 15.7 (H) 08/30/2022 1029   RDW 14.7 07/27/2020 1056   LYMPHSABS 2.0 08/30/2022 1029   LYMPHSABS 1.3 07/27/2020 1056   MONOABS 1.3 (H) 08/30/2022 1029   EOSABS 0.4 08/30/2022 1029   EOSABS 0.3 07/27/2020 1056   BASOSABS 0.1 08/30/2022 1029   BASOSABS 0.1 07/27/2020 1056   Absolute eos 07/27/20 - 300    Assessment & Plan:   Discussion: 77 year old male CAD s/p CABG, OSA on CPAP, HTN, HLD who presents for evaluation for COPD and OSA.  Discussed clinical course and management of COPD/asthma including bronchodilator regimen, preventive care including vaccinations and action plan for exacerbation.   Shortness of breath - multifactorial.  --Will optimize inhalers and repeat sleep study as noted below  COPD --START Symbicort 160-4.5 mcg TWO puffs in morning and evening. Rinse out mouth after use.  --CONTINUE Albuterol AS NEEDED for shortness of breath or wheezing.  --Our staff will contact you for your annual CT lung screen --Encourage regular activity including daily walks up 20-30 minutes  OSA on CPAP --Continue CPAP nightly --Please call our office CPAP settings. Addendum: Air view documented CPAP settings Auto CPAP 5-20 cm H20  --ORDER Split night study order with comments for PAP titration --Avoid driving when sleepy --Work on [employer] loss/maintenace  Health Maintenance Immunization History  Administered Date(s) Administered   Influenza Inj Mdck Quad Pf 03/12/2022   Influenza Split 04/06/2015   Influenza, High Dose Seasonal PF 03/06/2018, 02/15/2019, 02/15/2019, 03/23/2021   Influenza,inj,Quad PF,6+ Mos 05/25/2013   Influenza-Unspecified 03/25/2014, 02/23/2020, 03/08/2020   Moderna Sars-Covid-2 Vaccination 08/12/2019, 08/31/2019, 03/08/2020   PFIZER Comirnaty(Gray Top)Covid-19 Tri-Sucrose Vaccine 02/23/2020, 11/28/2020   PFIZER(Purple Top)SARS-COV-2 Vaccination 02/23/2020, 03/23/2021   Pneumococcal Conjugate-13 04/28/2019   Pneumococcal Polysaccharide-23 04/11/2016   Zoster Recombinat (Shingrix) 01/21/2017, 03/26/2017   CT Lung Screen - Qualified. Last CT chest 09/2020  Orders Placed This Encounter  Procedures   Split night study    Split night for AHI and PAP titration Already wears CPAP at home    Standing Status:   Future    Standing Expiration Date:   09/13/2023    Order Specific Question:   Where should this test be performed:  Answer:   [facility]  No orders of the defined types  were placed in this encounter.   Return in about 3 months (around 12/14/2022) for routine follow-up, with Dr. Loanne Drilling.  I have spent a total time of 45-minutes on the day of the appointment reviewing prior documentation, coordinating care and discussing medical diagnosis and plan with the patient/family. Imaging, labs and tests included in this note have been reviewed and interpreted independently by me.  [place], MD [facility] 09/13/2022 1:10 PM  Office Number [phone]

## 2022-09-13 NOTE — Patient Instructions (Signed)
COPD --START Symbicort 160-4.5 mcg TWO puffs in morning and evening. Rinse out mouth after use.  --CONTINUE Albuterol AS NEEDED for shortness of breath or wheezing.  --Our staff will contact you for your annual CT lung screen --Encourage regular activity including daily walks up 20-30 minutes  OSA on CPAP --Continue CPAP nightly --Please call our office CPAP settings --ORDER Split night study order with comments for PAP titration --Avoid driving when sleepy --Work on weight loss/maintenace

## 2022-09-13 NOTE — Telephone Encounter (Signed)
FYI, called pt states that's the only number he sees when he pushes the bottoms

## 2022-09-13 NOTE — Telephone Encounter (Signed)
Air view documented CPAP settings Auto CPAP 5-20 cm H20

## 2022-09-15 ENCOUNTER — Encounter (HOSPITAL_BASED_OUTPATIENT_CLINIC_OR_DEPARTMENT_OTHER): Payer: Self-pay | Admitting: Pulmonary Disease

## 2022-09-20 DIAGNOSIS — M479 Spondylosis, unspecified: Secondary | ICD-10-CM | POA: Diagnosis not present

## 2022-09-20 DIAGNOSIS — Z6841 Body Mass Index (BMI) 40.0 and over, adult: Secondary | ICD-10-CM | POA: Diagnosis not present

## 2022-09-20 DIAGNOSIS — M5136 Other intervertebral disc degeneration, lumbar region: Secondary | ICD-10-CM | POA: Diagnosis not present

## 2022-09-21 ENCOUNTER — Ambulatory Visit (HOSPITAL_BASED_OUTPATIENT_CLINIC_OR_DEPARTMENT_OTHER): Payer: Medicare PPO | Admitting: Family

## 2022-09-25 ENCOUNTER — Other Ambulatory Visit: Payer: Self-pay | Admitting: Physician Assistant

## 2022-09-25 DIAGNOSIS — M5136 Other intervertebral disc degeneration, lumbar region: Secondary | ICD-10-CM

## 2022-09-25 DIAGNOSIS — M479 Spondylosis, unspecified: Secondary | ICD-10-CM

## 2022-09-25 DIAGNOSIS — M4726 Other spondylosis with radiculopathy, lumbar region: Secondary | ICD-10-CM

## 2022-09-26 ENCOUNTER — Ambulatory Visit
Admission: RE | Admit: 2022-09-26 | Discharge: 2022-09-26 | Disposition: A | Discharge status: Home / Self Care | Payer: Medicare PPO | Source: Ambulatory Visit | Attending: Family Medicine | Admitting: Family Medicine

## 2022-09-26 DIAGNOSIS — M47812 Spondylosis without myelopathy or radiculopathy, cervical region: Secondary | ICD-10-CM | POA: Diagnosis not present

## 2022-09-26 DIAGNOSIS — J3489 Other specified disorders of nose and nasal sinuses: Secondary | ICD-10-CM | POA: Diagnosis not present

## 2022-09-26 DIAGNOSIS — I6523 Occlusion and stenosis of bilateral carotid arteries: Secondary | ICD-10-CM | POA: Diagnosis not present

## 2022-09-26 DIAGNOSIS — R221 Localized swelling, mass and lump, neck: Secondary | ICD-10-CM | POA: Diagnosis not present

## 2022-09-26 DIAGNOSIS — R590 Localized enlarged lymph nodes: Secondary | ICD-10-CM

## 2022-09-26 MED ORDER — IOPAMIDOL (ISOVUE-300) INJECTION 61%
75.0000 mL | Freq: Once | INTRAVENOUS | Status: AC | PRN
  Administered 2022-09-26: 75 mL via INTRAVENOUS

## 2022-09-28 ENCOUNTER — Other Ambulatory Visit: Payer: Self-pay | Admitting: Cardiovascular Disease

## 2022-09-28 NOTE — Telephone Encounter (Signed)
Please assist with refill.  Thank you. 

## 2022-10-05 ENCOUNTER — Ambulatory Visit (HOSPITAL_BASED_OUTPATIENT_CLINIC_OR_DEPARTMENT_OTHER): Payer: Medicare PPO | Attending: Pulmonary Disease | Admitting: Pulmonary Disease

## 2022-10-05 DIAGNOSIS — G4733 Obstructive sleep apnea (adult) (pediatric): Secondary | ICD-10-CM | POA: Diagnosis not present

## 2022-10-08 ENCOUNTER — Ambulatory Visit (HOSPITAL_BASED_OUTPATIENT_CLINIC_OR_DEPARTMENT_OTHER): Payer: Medicare PPO | Admitting: Family

## 2022-10-08 NOTE — Progress Notes (Deleted)
Office Visit    Patient Name: David Brandt Date of Encounter: 10/08/2022  PCP:  Nelwyn Salisbury, MD   [facility]  Cardiologist:  Chilton Si, MD  Advanced Practice Provider:  No care team member to display Electrophysiologist:  None      Chief Complaint    David Brandt is a 77 y.o. male presents today for ***  Past Medical History    Past Medical History:  Diagnosis Date   Abnormal cardiovascular stress test 03/29/2016   Allergic rhinitis    Allergy    Arthritis    Asthma    Back pain    Diabetes (HCC) 03/29/2016   Gout    History of kidney stones    Hyperlipidemia    Hypertension    Joint pain    Multiple food allergies    Prediabetes    Sleep apnea    cpap   Past Surgical History:  Procedure Laterality Date   CARDIAC CATHETERIZATION N/A 03/29/2016   Procedure: Right/Left Heart Cath and Coronary Angiography;  Surgeon: Yvonne Kendall, MD;  Location: [facility] INVASIVE CV LAB;  Service: Cardiovascular;  Laterality: N/A;   CARDIAC CATHETERIZATION N/A 03/29/2016   Procedure: Intravascular Pressure Wire/FFR Study;  Surgeon: Yvonne Kendall, MD;  Location: [facility] INVASIVE CV LAB;  Service: Cardiovascular;  Laterality: N/A;   CARDIAC CATHETERIZATION Left 04/02/2016   Procedure: FEMORAL ARTERIAL LINE INSERTION;  Surgeon: Delight Ovens, MD;  Location: [facility] OR;  Service: Open Heart Surgery;  Laterality: Left;   COLONOSCOPY  02/20/2018   per Dr. 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   Procedure: CORONARY ARTERY BYPASS GRAFTING (CABG) X 5 UTILIZING LEFT INTERNAL MAMMARY ARTERY AND ENDOSCOPICALLY HARVESTED GREATER SAPHENOUS VEIN ; Mammary to LAD, Sequencial 1st to 2nd Diagonal, Vein to OM, Vein to Distal RCA.;  Surgeon: Delight Ovens, MD;  Location: [facility] OR;  Service: Open Heart Surgery;  Laterality: N/A;   CYSTOSCOPY/URETEROSCOPY/HOLMIUM LASER/STENT PLACEMENT Bilateral  05/07/2019   Procedure: LEFT URETEROSCOPY/HOLMIUM LASER/STENT PLACEMENT/BIOPSY LEFT MID URETER/RIGHT RETROGRADE STENT PLACEMENT ;  Surgeon: Crist Fat, MD;  Location: [facility] ORS;  Service: Urology;  Laterality: Bilateral;   CYSTOSCOPY/URETEROSCOPY/HOLMIUM LASER/STENT PLACEMENT Bilateral 05/27/2019   Procedure: CYSTOSCOPY, URETEROSCOPY/ RETROGRADE PYELOGRAM/ HOLMIUM LASER/STENT EXCHANGE;  Surgeon: Crist Fat, MD;  Location: [facility] ORS;  Service: Urology;  Laterality: Bilateral;   LYMPH NODE BIOPSY Left 04/02/2016   Procedure: INCIDENTAL LEFT MAMMARY LYMPH NODE BIOPSY;  Surgeon: Delight Ovens, MD;  Location: [facility] OR;  Service: Open Heart Surgery;  Laterality: Left;   mass abdomen  1955   NEPHROLITHOTOMY Left 04/24/2019   Procedure: NEPHROLITHOTOMY PERCUTANEOUS WITH SURGEON ACCESS;  Surgeon: Crist Fat, MD;  Location: [facility] ORS;  Service: Urology;  Laterality: Left;   TEE WITHOUT CARDIOVERSION N/A 04/02/2016   Procedure: TRANSESOPHAGEAL ECHOCARDIOGRAM (TEE);  Surgeon: Delight Ovens, MD;  Location: [facility] OR;  Service: Open Heart Surgery;  Laterality: N/A;    Allergies  Allergies  Allergen Reactions   Other Hives   Yellow Jacket Venom Anaphylaxis   Allopurinol     Joint pain    Justicia Adhatoda (Malabar Nut Tree) [Justicia Adhatoda]     Allergic to Walnuts, hickory nuts, and almonds   Valsartan-Hydrochlorothiazide     Lip swelling    Sulfa Antibiotics Hives    History of Present Illness    David Brandt is a 77 y.o. male with a hx of CAD  s/p CABG 2017, HTN, HLD, obesity, OSA last seen 07/26/22 by Phillips Hay, RPH.  Initial evaluation 2017 chest pressure, shortness of breath. Nuclear stress test 03/2016 EF 48%, large inferior/inferolateral MI with no ischemia. Cardiac cath found to have severe 3-vessel CAD. Underwent CABG by Dr. Tyrone Sage 04/02/2016. He did have post operative atrial fibrillation on Amiodarone which was later discontinued with no recurrent atrial fib.  Echo 03/30/16 EF 55-60%, otherwise unremarkable.  At visit 03/2022 was granted clearance for laminectomy.    Saw Dr. Duke Salvia 07/25/2022 with fatigue, weight gain, exertional dyspnea.  Carvedilol was discontinued and switch to metoprolol 25 mg daily due to wheezing.  He was recommended to follow-up with pulmonology.  Prior PFTs indicated restrictive lung disease.  He subsequently saw pharmacy team 07/26/2022 and was started on [facility] for diabetes and weight loss benefit.  He saw Dr. Everardo All of pulmonology 09/13/2022.  Inhalers were optimized and he was recommended for repeat split-night sleep study.  Presents today for follow-up.***    EKGs/Labs/Other Studies Reviewed:   The following studies were reviewed today:  Cardiac Studies & Procedures   CARDIAC CATHETERIZATION  CARDIAC CATHETERIZATION 03/29/2016  Narrative Conclusions: 1. Severe 3 vessel coronary artery disease, as detailed below, including 50% distal LMCA/ostial LAD stenoses (FFR 0.70), 80% D1 lesion, 80% ostial LCx stenosis, and diffusely diseased RCA with 99% mid RCA stenosis with TIMI-1 flow and competitive filling of the PDA and rPL branches by left-to-right collaterals. 2. Mildly decreased LV systolic function with inferior akinesis (LVEF 45-50%) 3. Upper normal to mildly elevated left heart filling pressures. 4. Reduced Fick cardiac output.  Recommendations: 1. Transfer to 3W-stepdown for monitoring in the setting of unstable angina with severe three-vessel CAD.  Initiate heparin infusion 4 hours after TR band deflated. 2. Cardiac surgery consultation in AM to evaluate for CABG. 3. Consider viability study to further assess the inferior wall prior to revascularization. 4. Transthoracic echocardiogram to evaluate for significant valvular disease. 5. Continue ASA and metoprolol; will increase atorvastatin to 80 mg daily.  Findings Coronary Findings Diagnostic  Dominance: Right  Left Main The lesion is calcified. Pressure  wire/FFR was performed on the lesion. FFR: 0.7.  Left Anterior Descending Vessel is large. There is mild the vessel. The lesion is calcified. Pressure wire/FFR was performed on the lesion. FFR: 0.7.  First Diagonal Branch Vessel is moderate in size. The lesion is irregular.  Second Diagonal Branch Vessel is moderate in size.  Ramus Intermedius Vessel is large. The vessel exhibits minimal luminal irregularities.  Left Circumflex Vessel is large.  First Obtuse Marginal Branch Vessel is small in size.  Second Obtuse Marginal Branch Vessel is moderate in size.  Third Obtuse Marginal Branch Vessel is moderate in size.  Right Coronary Artery Vessel is large. There is moderate the vessel. with left-to-right collateral flow. The lesion is calcified. TIMI-1 flow into distal vessel.  PDA and rPL branches also fill via left-to-right collaterals.  Right Posterior Descending Artery Vessel is moderate in size. Collaterals RPDA filled by collaterals from 3rd Sept.  Right Posterior Atrioventricular Artery Vessel is moderate in size.  Intervention  No interventions have been documented.   STRESS TESTS  MYOCARDIAL PERFUSION IMAGING 03/27/2019  Narrative  Nuclear stress EF: 58%.  There was no ST segment deviation noted during stress.  The study is normal.  This is a low risk study.  The left ventricular ejection fraction is normal (55-65%).  Normal stress nuclear study with no ischemia or infarction.  Gated ejection fraction 58% with  normal wall motion.   ECHOCARDIOGRAM  ECHOCARDIOGRAM COMPLETE 03/30/2019  Narrative ECHOCARDIOGRAM REPORT    Patient Name:   AVIMAEL GRISCOM Date of Exam: 03/30/2019 Medical Rec #:  [id number]         Height:       69.0 in Accession #:    [id number]        Weight:       330.0 lb Date of Birth:  06/18/1946         BSA:          2.56 m Patient Age:    73 years          BP:           156/82 mmHg Patient Gender: M                 HR:            76 bpm. Exam Location:  [address]  Procedure: 2D Echo, 3D Echo, Cardiac Doppler and Color Doppler  Indications:    R06.02 Shortness of breath  History:        Patient has prior history of Echocardiogram examinations, most recent 12/17/2017. Prior CABG; COPD Risk Factors:Hypertension, Sleep Apnea, Diabetes, Dyslipidemia, Former Smoker and Morbid obesity.  Sonographer:    Garald Braver, RDCS Referring Phys: [id number] [facility]  IMPRESSIONS   1. Left ventricular ejection fraction, by visual estimation, is 65 to 70%. The left ventricle has normal function. Normal left ventricular size. There is no left ventricular hypertrophy. 2. Left ventricular diastolic Doppler parameters are consistent with pseudonormalization pattern of LV diastolic filling. 3. Global right ventricle has normal systolic function.The right ventricular size is normal. No increase in right ventricular wall thickness. 4. Left atrial size was normal. 5. Right atrial size was normal. 6. The mitral valve is normal in structure. No evidence of mitral valve regurgitation. No evidence of mitral stenosis. 7. The tricuspid valve is normal in structure. Tricuspid valve regurgitation was not visualized by color flow Doppler. 8. The aortic valve is normal in structure. Aortic valve regurgitation was not visualized by color flow Doppler. Mild aortic valve sclerosis without stenosis. 9. The pulmonic valve was normal in structure. Pulmonic valve regurgitation is not visualized by color flow Doppler. 10. The inferior vena cava is normal in size with greater than 50% respiratory variability, suggesting right atrial pressure of 3 mmHg.  In comparison to the previous echocardiogram(s): 12/17/17 EF 55-60%. FINDINGS Left Ventricle: Left ventricular ejection fraction, by visual estimation, is 65 to 70%. The left ventricle has normal function. There is no left ventricular hypertrophy. Normal left ventricular size. Spectral  Doppler shows Left ventricular diastolic Doppler parameters are consistent with pseudonormalization pattern of LV diastolic filling.  Right Ventricle: The right ventricular size is normal. No increase in right ventricular wall thickness. Global RV systolic function is has normal systolic function.  Left Atrium: Left atrial size was normal in size.  Right Atrium: Right atrial size was normal in size  Pericardium: There is no evidence of pericardial effusion.  Mitral Valve: The mitral valve is normal in structure. No evidence of mitral valve stenosis by observation. No evidence of mitral valve regurgitation.  Tricuspid Valve: The tricuspid valve is normal in structure. Tricuspid valve regurgitation was not visualized by color flow Doppler.  Aortic Valve: The aortic valve is normal in structure. Aortic valve regurgitation was not visualized by color flow Doppler. Mild aortic valve sclerosis is present, with no evidence of aortic valve stenosis.  Pulmonic Valve: The pulmonic valve was normal in structure. Pulmonic valve regurgitation is not visualized by color flow Doppler.  Aorta: The aortic root, ascending aorta and aortic arch are all structurally normal, with no evidence of dilitation or obstruction.  Venous: The inferior vena cava is normal in size with greater than 50% respiratory variability, suggesting right atrial pressure of 3 mmHg.  IAS/Shunts: No atrial level shunt detected by color flow Doppler. No ventricular septal defect is seen or detected. There is no evidence of an atrial septal defect.    LEFT VENTRICLE PLAX 2D LVIDd:         5.10 cm  Diastology LVIDs:         3.60 cm  LV e' lateral:   7.83 cm/s LV PW:         1.10 cm  LV E/e' lateral: 13.3 LV IVS:        1.10 cm  LV e' medial:    7.62 cm/s LVOT diam:     2.00 cm  LV E/e' medial:  13.6 LV SV:         69 ml LV SV Index:   24.94 LVOT Area:     3.14 cm  3D Volume EF: 3D EF:        65 % LV EDV:       185 ml LV  ESV:       65 ml LV SV:        120 ml  RIGHT VENTRICLE RV Basal diam:  3.40 cm RV S prime:     10.30 cm/s TAPSE (M-mode): 1.8 cm  LEFT ATRIUM             Index       RIGHT ATRIUM           Index LA diam:        4.90 cm 1.92 cm/m  RA Pressure: 3.00 mmHg LA Vol (A2C):   53.8 ml 21.05 ml/m RA Area:     14.10 cm LA Vol (A4C):   57.3 ml 22.42 ml/m RA Volume:   31.20 ml  12.21 ml/m LA Biplane Vol: 56.4 ml 22.07 ml/m AORTIC VALVE LVOT Vmax:   158.00 cm/s LVOT Vmean:  105.000 cm/s LVOT VTI:    0.305 m  AORTA Ao Root diam: 3.70 cm Ao Asc diam:  3.40 cm  MITRAL VALVE                         TRICUSPID VALVE Estimated RAP:  3.00 mmHg  MV Decel Time: 236 msec              SHUNTS MV E velocity: 104.00 cm/s 103 cm/s  Systemic VTI:  0.30 m MV A velocity: 80.00 cm/s  70.3 cm/s Systemic Diam: 2.00 cm MV E/A ratio:  1.30        1.5   Donato Schultz MD Electronically signed by Donato Schultz MD Signature Date/Time: 03/30/2019/11:46:00 AM    Final   TEE  ECHO TEE 04/02/2016  Interpretation Summary  Left ventricle: Normal cavity size. LV systolic function is mildly reduced with an EF of 45-50%. There are no obvious wall motion abnormalities.  Septum: No Patent Foramen Ovale present.  Left atrium: Patent foramen ovale not present.  Left atrium: Cavity is mildly dilated.  Aortic valve: The valve is trileaflet. Mild valve thickening present. No stenosis. No regurgitation.  Mitral valve: Mild to moderate regurgitation.  Right ventricle: Normal ejection fraction. Normal  tricuspid annular plane systolic excursion (TAPSE).  Tricuspid valve: Mild regurgitation. The tricuspid valve regurgitation jet is central.             EKG:  EKG is not  ordered today.    Recent Labs: 07/26/2022: ALT 13 08/30/2022: BUN 22; Creatinine, Ser 1.00; Hemoglobin 16.0; Platelets 246.0; Potassium 4.0; Sodium 135  Recent Lipid Panel    Component Value Date/Time   CHOL 117 07/26/2022 0821   TRIG 156 (H)  07/26/2022 0821   HDL 41 07/26/2022 0821   CHOLHDL 2.9 07/26/2022 0821   CHOLHDL 4.1 03/30/2016 0156   VLDL 59 (H) 03/30/2016 0156   LDLCALC 50 07/26/2022 0821   Home Medications   No outpatient medications have been marked as taking for the 10/08/22 encounter (Appointment) with Alver Sorrow, NP.    Review of Systems      All other systems reviewed and are otherwise negative except as noted above.  Physical Exam    VS:  There were no vitals taken for this visit. , BMI There is no height or weight on file to calculate BMI.  Wt Readings from Last 3 Encounters:  10/05/22 278 lb (126.1 kg)  09/13/22 287 lb 13 oz (130.6 kg)  08/30/22 292 lb (132.5 kg)     GEN: Well nourished, overweight, well developed, in no acute distress. HEENT: normal. Neck: Supple, no JVD, carotid bruits, or masses. Cardiac: bradycardic, RRR, no murmurs, rubs, or gallops. No clubbing, cyanosis, edema.  Radials/PT 2+ and equal bilaterally.  Respiratory:  Respirations regular and unlabored, clear to auscultation bilaterally. GI: Soft, nontender, nondistended. MS: No deformity or atrophy. Skin: Warm and dry, no rash. Neuro:  Strength and sensation are intact. Psych: Normal affect.  Assessment & Plan    CAD s/p CABG - Stable with no anginal symptoms. No indication for ischemic evaluation.  GDMT Aspirin, Atorvastatin, Coreg. Heart healthy diet and regular cardiovascular exercise encouraged.    HTN - Hypotension today though asymptomatic with no lightheadedness nor dizziness. Reduce Coreg to 12.5mg  BID. Send MyChart message in one week to check in - if BP persistently low, plan to reduce Hydralazine.   Bradycardia - Asymptomatic today with HR 47 bpm. Reduce Coreg to 12.5mg  BID. Check in via MyChart one week.   HLD, LDL goal <70 - 06/2020 LDL 44. Continue Atorvastatin 80mg .   No BP recorded.  {Refresh Note OR Click here to enter BP  :1}***      Disposition: Follow up in 6 month(s) with Chilton Si,  MD or APP.  Signed, Alver Sorrow, NP 10/08/2022, 10:12 AM [facility]

## 2022-10-09 DIAGNOSIS — G4733 Obstructive sleep apnea (adult) (pediatric): Secondary | ICD-10-CM

## 2022-10-09 NOTE — Procedures (Signed)
Patient Name: David Brandt, David Brandt Date: 10/05/2022 Gender: Male D.O.B: 09/05/1945 Age (years): 58 Referring Provider: Chi Mechele Collin Height (inches): 68 Interpreting Physician: Coralyn Helling MD, ABSM Weight (lbs): 278 RPSGT: Rolene Arbour BMI: 42 MRN: [id number] Neck Size: 18.50  CLINICAL INFORMATION Sleep Study Type: Split Night CPAP  Indication for sleep study: OSA  Epworth Sleepiness Score: 5  SLEEP STUDY TECHNIQUE As per the AASM Manual for the Scoring of Sleep and Associated Events v2.3 (April 2016) with a hypopnea requiring 4% desaturations.  The channels recorded and monitored were frontal, central and occipital EEG, electrooculogram (EOG), submentalis EMG (chin), nasal and oral airflow, thoracic and abdominal wall motion, anterior tibialis EMG, snore microphone, electrocardiogram, and pulse oximetry. Continuous positive airway pressure (CPAP) was initiated when the patient met split night criteria and was titrated according to treat sleep-disordered breathing.  MEDICATIONS Medications self-administered by patient taken the night of the study : N/A  RESPIRATORY PARAMETERS Diagnostic  Total AHI (/hr): 46.3 RDI (/hr): 62.2 OA Index (/hr): 30.8 CA Index (/hr): 1.0 REM AHI (/hr): N/A NREM AHI (/hr): 46.3 Supine AHI (/hr): 82.6 Non-supine AHI (/hr): 1.1 Min O2 Sat (%): 91.0 Mean O2 (%): 94.8 Time below 88% (min): 0   Titration  Optimal Pressure (cm): 13 AHI at Optimal Pressure (/hr): 0 Min O2 at Optimal Pressure (%): 90.0 Supine % at Optimal (%): 100 Sleep % at Optimal (%): 79   SLEEP ARCHITECTURE The recording time for the entire night was 426.5 minutes.  During a baseline period of 245.1 minutes, the patient slept for 124.5 minutes in REM and nonREM, yielding a sleep efficiency of 50.8%. Sleep onset after lights out was 33.9 minutes with a REM latency of N/A minutes. The patient spent 6.0% of the night in stage N1 sleep, 94.0% in stage N2 sleep,  0.0% in stage N3 and 0% in REM.  During the titration period of 168.5 minutes, the patient slept for 130.5 minutes in REM and nonREM, yielding a sleep efficiency of 77.4%. Sleep onset after CPAP initiation was 24.4 minutes with a REM latency of 81.5 minutes. The patient spent 2.3% of the night in stage N1 sleep, 67.4% in stage N2 sleep, 0.0% in stage N3 and 30.3% in REM.  CARDIAC DATA The 2 lead EKG demonstrated sinus rhythm. The mean heart rate was 100.0 beats per minute. Other EKG findings include: None.  LEG MOVEMENT DATA The total Periodic Limb Movements of Sleep (PLMS) were 0. The PLMS index was 0.0 .  IMPRESSIONS - Severe obstructive sleep apnea with an AHI of 46.3 and SpO2 low of 91%. - He did well with CPAP 13 cm H2O.  He was observed in REM and supine sleep with this pressure setting. - He did not require the use of supplemental oxygen during this study. - The patient snored with loud snoring volume during the diagnostic portion of the study.  DIAGNOSIS - Obstructive Sleep Apnea (G47.33)  RECOMMENDATIONS - Trial of CPAP therapy on 13 cm H2O with a Medium size Fisher&Paykel Full Face Simplus mask and heated humidification. - Avoid alcohol, sedatives and other CNS depressants that may worsen sleep apnea and disrupt normal sleep architecture. - Sleep hygiene should be reviewed to assess factors that may improve sleep quality. - Weight management and regular exercise should be initiated or continued.  [Electronically signed] 10/09/2022 02:44 PM  Coralyn Helling MD, ABSM Diplomate, American Board of Sleep Medicine NPI: [id number] [facility] PH: [phone]   FX: [phone] ACCREDITED BY THE AMERICAN ACADEMY OF SLEEP MEDICINE  

## 2022-10-17 ENCOUNTER — Other Ambulatory Visit: Payer: Self-pay | Admitting: Family Medicine

## 2022-10-17 ENCOUNTER — Encounter: Payer: Self-pay | Admitting: Physician Assistant

## 2022-10-17 DIAGNOSIS — F419 Anxiety disorder, unspecified: Secondary | ICD-10-CM

## 2022-10-19 ENCOUNTER — Telehealth (HOSPITAL_BASED_OUTPATIENT_CLINIC_OR_DEPARTMENT_OTHER): Payer: Self-pay | Admitting: Pulmonary Disease

## 2022-10-19 DIAGNOSIS — G4733 Obstructive sleep apnea (adult) (pediatric): Secondary | ICD-10-CM

## 2022-10-19 NOTE — Telephone Encounter (Signed)
Order has been placed. Nothing further needed. 

## 2022-10-19 NOTE — Telephone Encounter (Signed)
[facility] Telephone Encounter  Sleep study 10/09/22 - AHI 46. CPAP recommended 13 cm H20  Contacted patient and he agrees to start CPAP on these settings.  Please order new start CPAP 13 cm H20 with supplies

## 2022-10-22 ENCOUNTER — Ambulatory Visit: Payer: Medicare PPO | Attending: Cardiology | Admitting: Pharmacist Clinician (PhC)/ Clinical Pharmacy Specialist

## 2022-10-22 ENCOUNTER — Encounter: Payer: Self-pay | Admitting: Pharmacist Clinician (PhC)/ Clinical Pharmacy Specialist

## 2022-10-22 NOTE — Patient Instructions (Signed)
I have reached out to the [facility] at [facility] to see if they have any Mounjaro.  Please call them when you have a moment.  Their phone number is [phone].   When you get to the 3rd pen, please call the pharmacy to see about getting the next dose ordered.    TIPS FOR SUCCESS Write down the reasons why you want to lose weight and post it in a place where you'll see it often. Start small and work your way up. Keep in mind that it takes time to achieve goals, and small steps add up. Any additional movements help to burn calories. Taking the stairs rather than the elevator and parking at the far end of your parking lot are easy ways to start. Brisk walking for at least 30 minutes 4 or more days of the week is an excellent goal to work toward  [employer] WHAT IT MEANS TO FEEL FULL Did you know that it can take 15 minutes or more for your brain to receive the message that you've eaten? That means that, if you eat less food, but consume it slower, you may still feel satisfied. Eating a lot of fruits and vegetables can also help you feel fuller. Eat off of smaller plates so that moderate portions don't seem too small  TITRATION PLAN Will plan to follow the titration plan as below, pending patient is tolerating each dose before increasing to the next. Can slow titration if needed for tolerability.    Start on the 5 mg dose as soon as you are able.    Increase by 2.5 mg every 4 weeks until you get to 15 mg or you have side effects like nausea/vomiting.    If you have any questions or concerns, please reach out to [name].  Addilee Neu/Chris at [phone].  THANK YOU FOR CHOOSING [facility]

## 2022-10-22 NOTE — Progress Notes (Signed)
Office Visit    Patient Name: David Brandt Date of Encounter: 10/22/2022  Primary Care Provider:  Nelwyn Salisbury, MD Primary Cardiologist:  Chilton Si, MD  Chief Complaint    Weight management  Significant Past Medical History   DM2 Metofromin 500mg  qd  HTN Felodipine, hydralazine, hctz, metoprolol, spironolactone  CAD CABG x 5  hypogonadism On testosterone supplementation    Allergies  Allergen Reactions   Other Hives   Yellow Jacket Venom Anaphylaxis   Allopurinol     Joint pain    Justicia Adhatoda (Malabar Nut Tree) [Justicia Adhatoda]     Allergic to Walnuts, hickory nuts, and almonds   Valsartan-Hydrochlorothiazide     Lip swelling    Sulfa Antibiotics Hives    History of Present Illness    David Brandt is a 77 y.o. male patient of Dr Duke Salvia, in the office today to discuss options for weight management.  He tells me that he worked with [facility] last year and dropped about 50 pounds.  This was needed so that he could have back surgery.  Once the surgery was completed, he stopped seeing that group and is now up about 15 pounds (over the last 3 months).  His wife had gastric bypass surgery, so he is trying to eat the same foods and quantities that she does.    Today he returns for his 3 month follow up.  Unfortunately in that time he was only able to obtain a single box of 2.5 mg pens.  He took those for the 4 weeks and in that time noted a marked decrease in his cravings and felt less hungry overall.  Lost about 7 pounds in that time.  Because he has been off now for at least 2 weeks, he notes that he has gained all that weight back (plus more) and his cravings have returned.    Current weight management medications: none  Previously tried meds: none, did work with [facility] in the past  Current meds that may affect weight: none  Baseline weight/BMI: 291 lb // 44.25 Current weight/BMI: 297 lb //  45.26 Difference: +6 lb - has only had 4 weeks on medication in the past 3 months  Insurance payor: [employer]  Diet: mostly home cooked meals, (wife with gastric bypass) - now getting one meal and split main course;  sweets are downfall;  plenty of vegetables (due to wife)  Exercise: gets short of breath just walking short distances   Family History: mother died MI and stroke at 87, father died in his 55's; 1 sister w/o heart issues; kids 53, 17, daughter 28  Confirmed patient no personal or family history of medullary thyroid carcinoma (MTC) or Multiple Endocrine Neoplasia syndrome type 2 (MEN 2).   Social History:   Tobacco: quit about 4 years ago, still smokes marijjuana  Alcohol: rare  Caffeine: coffee most mornigs at home   Accessory Clinical Findings    Lab Results  Component Value Date   CREATININE 1.00 08/30/2022   BUN 22 08/30/2022   NA 135 08/30/2022   K 4.0 08/30/2022   CL 99 08/30/2022   CO2 28 08/30/2022   Lab Results  Component Value Date   ALT 13 07/26/2022   AST 15 07/26/2022   ALKPHOS 111 07/26/2022   BILITOT 0.4 07/26/2022   Lab Results  Component Value Date   HGBA1C 6.2 03/15/2022    Home Medications/Allergies    Current Outpatient  Medications  Medication Sig Dispense Refill   albuterol (VENTOLIN HFA) 108 (90 Base) MCG/ACT inhaler Inhale 2 puffs into the lungs every 4 (four) hours as needed for wheezing or shortness of breath. 8 g 5   Alcohol Swabs (DROPSAFE ALCOHOL PREP) 70 % PADS USE AS DIRECTED 300 each 0   Apoaequorin (PREVAGEN PO) Take 20 mg by mouth.     aspirin EC 81 MG tablet Take 81 mg by mouth daily.     atorvastatin (LIPITOR) 80 MG tablet Take 1 tablet (80 mg total) by mouth daily. 90 tablet 2   budesonide-formoterol (SYMBICORT) 160-4.5 MCG/ACT inhaler TAKE 2 PUFFS BY MOUTH TWICE A DAY 30.6 each 0   celecoxib (CELEBREX) 200 MG capsule TAKE 1 CAPSULE TWICE DAILY 180 capsule 3   Cholecalciferol (VITAMIN D3) 25 MCG (1000 UT) CAPS  Take 2,000 Units by mouth daily.     felodipine (PLENDIL) 10 MG 24 hr tablet Take 1 tablet (10 mg total) by mouth daily. 90 tablet 1   gabapentin (NEURONTIN) 300 MG capsule TAKE 1 CAPSULE BY MOUTH THREE TIMES A DAY 90 capsule 1   hydrALAZINE (APRESOLINE) 50 MG tablet TAKE 1 TABLET TWICE DAILY 180 tablet 3   hydrochlorothiazide (HYDRODIURIL) 25 MG tablet TAKE 1 TABLET EVERY DAY (NEED MD APPOINTMENT FOR REFILLS) 60 tablet 6   lidocaine (LIDODERM) 5 % Place 1 patch onto the skin daily. Remove & Discard patch within 12 hours or as directed by MD (Patient not taking: Reported on 09/13/2022) 30 patch 0   metFORMIN (GLUCOPHAGE-XR) 500 MG 24 hr tablet TAKE 1 TABLET EVERY DAY 90 tablet 3   metoprolol succinate (TOPROL XL) 25 MG 24 hr tablet Take 1 tablet (25 mg total) by mouth daily. 90 tablet 3   OVER THE COUNTER MEDICATION Take 2 capsules by mouth 2 (two) times daily. OmegaXL (Patient not taking: Reported on 09/13/2022)     oxyCODONE-acetaminophen (PERCOCET/ROXICET) 5-325 MG tablet Take 1 tablet by mouth 4 (four) times daily as needed.     POTASSIUM CITRATE PO Take 15 mEq by mouth.     sertraline (ZOLOFT) 50 MG tablet TAKE 1 TABLET EVERY DAY 90 tablet 3   spironolactone (ALDACTONE) 25 MG tablet TAKE 1 TABLET EVERY DAY 90 tablet 3   tirzepatide (MOUNJARO) 10 MG/0.5ML Pen Inject 10 mg into the skin once a week. (Patient not taking: Reported on 09/13/2022) 2 mL 1   tirzepatide (MOUNJARO) 2.5 MG/0.5ML Pen Inject 2.5 mg into the skin once a week. (Patient not taking: Reported on 09/13/2022) 2 mL 1   tirzepatide (MOUNJARO) 5 MG/0.5ML Pen INJECT 5 MG SUBCUTANEOUSLY WEEKLY 2 mL 1   tirzepatide (MOUNJARO) 7.5 MG/0.5ML Pen Inject 7.5 mg into the skin once a week. (Patient not taking: Reported on 09/13/2022) 2 mL 1   tiZANidine (ZANAFLEX) 2 MG tablet Take 2 mg by mouth 2 (two) times daily as needed.     TRUE METRIX BLOOD GLUCOSE TEST test strip TEST BLOOD SUGAR AS DIRECTED 300 strip 0   No current facility-administered  medications for this visit.     Allergies  Allergen Reactions   Other Hives   Yellow Jacket Venom Anaphylaxis   Allopurinol     Joint pain    Justicia Adhatoda (Malabar Nut Tree) [Justicia Adhatoda]     Allergic to Walnuts, hickory nuts, and almonds   Valsartan-Hydrochlorothiazide     Lip swelling    Sulfa Antibiotics Hives    Assessment & Plan    Morbid obesity [facility])  Patient  has not met goal of at least 5% of body weight loss with comprehensive lifestyle modifications alone in the past 3-6 months. Pharmacotherapy is appropriate to pursue as augmentation. Started Mounjaro back in February 2024.  Patient was unable to obtain medication until late March.  Took 4 doses of 2.5 mg and lost ~7 pounds.  Unfortunately was unable to get any further medication and has been off for about 2 weeks now.     He will reach out to [facility] at [facility] to see if they can get the 5 mg dose for him.  He tolerated 2.5 mg well and will go ahead and move to 5 mg now.    Advised patient on common side effects including nausea, diarrhea, dyspepsia, decreased appetite, and fatigue. Counseled patient on reducing meal size and how to titrate medication to minimize side effects. Patient aware to call if intolerable side effects or if experiencing dehydration, abdominal pain, or dizziness. Patient will adhere to dietary modifications and will target at least 150 minutes of moderate intensity exercise weekly.   Injection technique reviewed at today's visit.    Titration Plan:  Start Mounjaro 5 mg weekly x 4 weeks, increase dose by 2.5 mg every 4 weeks until at 15 mg weekly or develops increased side effects.   Follow up in 3 months.     Phillips Hay PharmD CPP [facility]  [address] [place], [place] [address] [phone]

## 2022-10-22 NOTE — Assessment & Plan Note (Signed)
  Patient has not met goal of at least 5% of body weight loss with comprehensive lifestyle modifications alone in the past 3-6 months. Pharmacotherapy is appropriate to pursue as augmentation. Started Mounjaro back in February 2024.  Patient was unable to obtain medication until late March.  Took 4 doses of 2.5 mg and lost ~7 pounds.  Unfortunately was unable to get any further medication and has been off for about 2 weeks now.     He will reach out to [facility] at [facility] to see if they can get the 5 mg dose for him.  He tolerated 2.5 mg well and will go ahead and move to 5 mg now.    Advised patient on common side effects including nausea, diarrhea, dyspepsia, decreased appetite, and fatigue. Counseled patient on reducing meal size and how to titrate medication to minimize side effects. Patient aware to call if intolerable side effects or if experiencing dehydration, abdominal pain, or dizziness. Patient will adhere to dietary modifications and will target at least 150 minutes of moderate intensity exercise weekly.   Injection technique reviewed at today's visit.    Titration Plan:  Start Mounjaro 5 mg weekly x 4 weeks, increase dose by 2.5 mg every 4 weeks until at 15 mg weekly or develops increased side effects.   Follow up in 3 months.

## 2022-10-23 ENCOUNTER — Ambulatory Visit
Admission: RE | Admit: 2022-10-23 | Discharge: 2022-10-23 | Disposition: A | Discharge status: Home / Self Care | Payer: Medicare PPO | Source: Ambulatory Visit | Attending: Physician Assistant | Admitting: Physician Assistant

## 2022-10-23 DIAGNOSIS — M4726 Other spondylosis with radiculopathy, lumbar region: Secondary | ICD-10-CM

## 2022-10-23 DIAGNOSIS — M5136 Other intervertebral disc degeneration, lumbar region: Secondary | ICD-10-CM

## 2022-10-23 DIAGNOSIS — M479 Spondylosis, unspecified: Secondary | ICD-10-CM

## 2022-11-01 ENCOUNTER — Other Ambulatory Visit: Payer: Self-pay | Admitting: *Deleted

## 2022-11-01 DIAGNOSIS — G4733 Obstructive sleep apnea (adult) (pediatric): Secondary | ICD-10-CM | POA: Diagnosis not present

## 2022-11-01 DIAGNOSIS — D751 Secondary polycythemia: Secondary | ICD-10-CM

## 2022-11-01 DIAGNOSIS — M5116 Intervertebral disc disorders with radiculopathy, lumbar region: Secondary | ICD-10-CM | POA: Diagnosis not present

## 2022-11-01 DIAGNOSIS — M479 Spondylosis, unspecified: Secondary | ICD-10-CM | POA: Diagnosis not present

## 2022-11-05 ENCOUNTER — Inpatient Hospital Stay: Payer: Medicare PPO | Admitting: Hematology and Oncology

## 2022-11-05 ENCOUNTER — Inpatient Hospital Stay: Payer: Medicare PPO | Attending: Family Medicine

## 2022-11-06 ENCOUNTER — Telehealth: Payer: Self-pay | Admitting: Hematology and Oncology

## 2022-11-06 DIAGNOSIS — M5116 Intervertebral disc disorders with radiculopathy, lumbar region: Secondary | ICD-10-CM | POA: Diagnosis not present

## 2022-11-06 DIAGNOSIS — M479 Spondylosis, unspecified: Secondary | ICD-10-CM | POA: Diagnosis not present

## 2022-11-06 NOTE — Telephone Encounter (Signed)
Scheduled appointment per patients request. Patient is aware of the made appointments.

## 2022-11-21 ENCOUNTER — Other Ambulatory Visit: Payer: Self-pay | Admitting: Cardiovascular Disease

## 2022-11-21 NOTE — Telephone Encounter (Signed)
Rx request sent to pharmacy.  

## 2022-11-30 ENCOUNTER — Other Ambulatory Visit: Payer: Self-pay | Admitting: Cardiovascular Disease

## 2022-11-30 DIAGNOSIS — E1169 Type 2 diabetes mellitus with other specified complication: Secondary | ICD-10-CM

## 2022-11-30 MED ORDER — MOUNJARO 7.5 MG/0.5ML ~~LOC~~ SOAJ
7.5000 mg | SUBCUTANEOUS | 1 refills | Status: DC
Start: 2022-11-30 — End: 2023-07-12

## 2022-11-30 NOTE — Telephone Encounter (Signed)
Please assist with refill.  Thank you. 

## 2022-12-06 ENCOUNTER — Inpatient Hospital Stay: Payer: Medicare HMO | Admitting: Hematology and Oncology

## 2022-12-06 ENCOUNTER — Inpatient Hospital Stay: Payer: Medicare HMO | Attending: Hematology and Oncology

## 2022-12-06 ENCOUNTER — Other Ambulatory Visit: Payer: Self-pay

## 2022-12-06 VITALS — BP 132/59 | HR 66 | Temp 97.5°F | Resp 18 | Ht 68.0 in | Wt 289.9 lb

## 2022-12-06 DIAGNOSIS — D751 Secondary polycythemia: Secondary | ICD-10-CM | POA: Insufficient documentation

## 2022-12-06 LAB — CBC WITH DIFFERENTIAL (CANCER CENTER ONLY)
Abs Immature Granulocytes: 0.02 10*3/uL (ref 0.00–0.07)
Basophils Absolute: 0.1 10*3/uL (ref 0.0–0.1)
Basophils Relative: 1 %
Eosinophils Absolute: 0.7 10*3/uL — ABNORMAL HIGH (ref 0.0–0.5)
Eosinophils Relative: 7 %
HCT: 47.5 % (ref 39.0–52.0)
Hemoglobin: 15.5 g/dL (ref 13.0–17.0)
Immature Granulocytes: 0 %
Lymphocytes Relative: 15 %
Lymphs Abs: 1.6 10*3/uL (ref 0.7–4.0)
MCH: 30.1 pg (ref 26.0–34.0)
MCHC: 32.6 g/dL (ref 30.0–36.0)
MCV: 92.2 fL (ref 80.0–100.0)
Monocytes Absolute: 0.9 10*3/uL (ref 0.1–1.0)
Monocytes Relative: 9 %
Neutro Abs: 7 10*3/uL (ref 1.7–7.7)
Neutrophils Relative %: 68 %
Platelet Count: 193 10*3/uL (ref 150–400)
RBC: 5.15 MIL/uL (ref 4.22–5.81)
RDW: 14.4 % (ref 11.5–15.5)
WBC Count: 10.2 10*3/uL (ref 4.0–10.5)
nRBC: 0 % (ref 0.0–0.2)

## 2022-12-06 NOTE — Progress Notes (Signed)
[facility] Cancer Follow up:    Nelwyn Salisbury, MD [address] [place] [address]  DIAGNOSIS: Secondary Polycythemia  SUMMARY OF HEMATOLOGIC HISTORY:  #1.  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  Monthly CBC and phlebotomy if hematocrit greater than 51--last 06/08/2021  CURRENT THERAPY: intermittent phlebotomy  INTERVAL HISTORY:  David Brandt 77 y.o. male returns for f/u of his polcythema.  He has stopped testosterone and his hematocrit has continued to improve.  He is now working on weight loss, started Convoy recently. No interim hospitalizations. No falls, headaches, chest pain, chest pressure, bleeding. No change in bowels or urinary habits.   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  MEDICAL HISTORY: Past Medical History:  Diagnosis Date   Abnormal cardiovascular stress test 03/29/2016   Allergic rhinitis    Allergy    Arthritis    Asthma    Back pain    Diabetes (HCC) 03/29/2016   Gout    History of kidney stones    Hyperlipidemia    Hypertension    Joint pain     Multiple food allergies    Prediabetes    Sleep apnea    cpap    SURGICAL HISTORY: Past Surgical History:  Procedure Laterality Date   CARDIAC CATHETERIZATION N/A 03/29/2016   Procedure: Right/Left Heart Cath and Coronary Angiography;  Surgeon: Yvonne Kendall, MD;  Location: [facility] INVASIVE CV LAB;  Service: Cardiovascular;  Laterality: N/A;   CARDIAC CATHETERIZATION N/A 03/29/2016   Procedure: Intravascular Pressure Wire/FFR Study;  Surgeon: Yvonne Kendall, MD;  Location: [facility] INVASIVE CV LAB;  Service: Cardiovascular;  Laterality: N/A;   CARDIAC CATHETERIZATION Left 04/02/2016   Procedure: FEMORAL ARTERIAL LINE INSERTION;  Surgeon: Delight Ovens, MD;  Location: [facility] OR;  Service: Open Heart Surgery;  Laterality: Left;   COLONOSCOPY  02/20/2018   per Dr. 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   Procedure: CORONARY ARTERY BYPASS GRAFTING (CABG) X 5 UTILIZING LEFT INTERNAL MAMMARY ARTERY AND ENDOSCOPICALLY HARVESTED GREATER SAPHENOUS VEIN ; Mammary to LAD, Sequencial 1st to 2nd Diagonal, Vein to OM, Vein to Distal RCA.;  Surgeon: Delight Ovens, MD;  Location: [facility] OR;  Service: Open Heart Surgery;  Laterality: N/A;   CYSTOSCOPY/URETEROSCOPY/HOLMIUM LASER/STENT PLACEMENT Bilateral 05/07/2019   Procedure: LEFT URETEROSCOPY/HOLMIUM LASER/STENT PLACEMENT/BIOPSY LEFT MID URETER/RIGHT RETROGRADE STENT PLACEMENT ;  Surgeon: Crist Fat, MD;  Location: [facility] ORS;  Service: Urology;  Laterality: Bilateral;   CYSTOSCOPY/URETEROSCOPY/HOLMIUM LASER/STENT PLACEMENT Bilateral 05/27/2019   Procedure: CYSTOSCOPY, URETEROSCOPY/ RETROGRADE PYELOGRAM/ HOLMIUM LASER/STENT EXCHANGE;  Surgeon: Crist Fat, MD;  Location: [facility];  Service: Urology;  Laterality:  Bilateral;   LYMPH NODE BIOPSY Left 04/02/2016   Procedure: INCIDENTAL LEFT MAMMARY LYMPH NODE BIOPSY;  Surgeon: Delight Ovens, MD;  Location: [facility] OR;  Service: Open Heart  Surgery;  Laterality: Left;   mass abdomen  1955   NEPHROLITHOTOMY Left 04/24/2019   Procedure: NEPHROLITHOTOMY PERCUTANEOUS WITH SURGEON ACCESS;  Surgeon: Crist Fat, MD;  Location: [facility]S;  Service: Urology;  Laterality: Left;   TEE WITHOUT CARDIOVERSION N/A 04/02/2016   Procedure: TRANSESOPHAGEAL ECHOCARDIOGRAM (TEE);  Surgeon: Delight Ovens, MD;  Location: [facility] OR;  Service: Open Heart Surgery;  Laterality: N/A;    SOCIAL HISTORY: Social History   Socioeconomic History   Marital status: Married    Spouse name: Steward Drone   Number of children: Y   Years of education: Not on file   Highest education level: Not on file  Occupational History   Not on file  Tobacco Use   Smoking status: Former    Packs/day: 1.00    Years: 47.00    Additional pack years: 0.00    Total pack years: 47.00    Types: Cigarettes    Quit date: 06/25/2018    Years since quitting: 4.4    Passive exposure: Past   Smokeless tobacco: Never   Tobacco comments:    Started smoking at age 54 smoked pack a day on average   Vaping Use   Vaping Use: Never used  Substance and Sexual Activity   Alcohol use: Not Currently    Comment: occaional shot of vodka   Drug use: Yes    Frequency: 7.0 times per week    Types: Marijuana    Comment: marijuana-states still smoking 10/19/2020   Sexual activity: Not on file  Other Topics Concern   Not on file  Social History Narrative   Not on file   Social Determinants of Health   Financial Resource Strain: Low Risk  (08/13/2022)   Overall Financial Resource Strain (CARDIA)    Difficulty of Paying Living Expenses: Not hard at all  Food Insecurity: No Food Insecurity (08/13/2022)   Hunger Vital Sign    Worried About Running Out of Food in the Last Year: Never true    Ran Out of Food in the Last Year: Never true  Transportation Needs: No Transportation Needs (08/13/2022)   PRAPARE - Administrator, Civil Service (Medical): No    Lack of Transportation  (Non-Medical): No  Physical Activity: Insufficiently Active (08/13/2022)   Exercise Vital Sign    Days of Exercise per Week: 2 days    Minutes of Exercise per Session: 20 min  Stress: No Stress Concern Present (08/13/2022)   [employer] of Occupational Health - Occupational Stress Questionnaire    Feeling of Stress : Not at all  Social Connections: Moderately Isolated (08/13/2022)   Social Connection and Isolation Panel [NHANES]    Frequency of Communication with Friends and Family: More than three times a week    Frequency of Social Gatherings with Friends and Family: More than three times a week    Attends Religious Services: Never    Database administrator or Organizations: No    Attends Banker Meetings: Never    Marital Status: Married  Catering manager Violence: Not At Risk (08/13/2022)   Humiliation, Afraid, Rape, and Kick questionnaire    Fear of Current or Ex-Partner: No    Emotionally Abused: No    Physically Abused: No    Sexually Abused: No  FAMILY HISTORY: Family History  Problem Relation Age of Onset   Colon cancer Father 42   Cancer Father    Alcohol abuse Father    Stroke Mother    Diabetes Mother    Hypertension Mother    Hyperlipidemia Mother    Obesity Mother    Heart disease Maternal Aunt    Diabetes Maternal Uncle    Stomach cancer Neg Hx    Esophageal cancer Neg Hx    Rectal cancer Neg Hx     Review of Systems  Constitutional:  Negative for appetite change, chills, fatigue, fever and unexpected weight change.  HENT:   Negative for hearing loss, lump/mass and trouble swallowing.   Eyes:  Negative for eye problems and icterus.  Respiratory:  Negative for chest tightness, cough and shortness of breath.   Cardiovascular:  Negative for chest pain, leg swelling and palpitations.  Gastrointestinal:  Negative for abdominal distention, abdominal pain, constipation, diarrhea, nausea and vomiting.  Endocrine: Negative for hot flashes.   Genitourinary:  Negative for difficulty urinating.   Musculoskeletal:  Negative for arthralgias.  Skin:  Negative for itching and rash.  Neurological:  Negative for dizziness, extremity weakness, headaches and numbness.  Hematological:  Negative for adenopathy. Does not bruise/bleed easily.  Psychiatric/Behavioral:  Negative for depression. The patient is not nervous/anxious.       PHYSICAL EXAMINATION  ECOG PERFORMANCE STATUS: 1 - Symptomatic but completely ambulatory  Vitals:   12/06/22 0913  BP: (!) 132/59  Pulse: 66  Resp: 18  Temp: (!) 97.5 F (36.4 C)  SpO2: 96%    Physical Exam Constitutional:      General: He is not in acute distress.    Appearance: Normal appearance. He is not toxic-appearing.  HENT:     Head: Normocephalic and atraumatic.  Eyes:     General: No scleral icterus. Cardiovascular:     Rate and Rhythm: Normal rate and regular rhythm.     Pulses: Normal pulses.     Heart sounds: Normal heart sounds.  Pulmonary:     Effort: Pulmonary effort is normal.     Breath sounds: Normal breath sounds.  Abdominal:     General: Abdomen is flat. Bowel sounds are normal. There is no distension.     Palpations: Abdomen is soft.     Tenderness: There is no abdominal tenderness.  Musculoskeletal:        General: No swelling.     Cervical back: Neck supple.  Lymphadenopathy:     Cervical: No cervical adenopathy.  Skin:    General: Skin is warm and dry.     Findings: No rash.  Neurological:     General: No focal deficit present.     Mental Status: He is alert.  Psychiatric:        Mood and Affect: Mood normal.        Behavior: Behavior normal.     LABORATORY DATA:  CBC    Component Value Date/Time   WBC 10.2 12/06/2022 0907   WBC 12.4 (H) 08/30/2022 1029   RBC 5.15 12/06/2022 0907   HGB 15.5 12/06/2022 0907   HGB 20.1 (HH) 07/27/2020 1056   HCT 47.5 12/06/2022 0907   HCT 59.9 (H) 07/27/2020 1056   PLT 193 12/06/2022 0907   PLT 195 07/27/2020  1056   MCV 92.2 12/06/2022 0907   MCV 92 07/27/2020 1056   MCH 30.1 12/06/2022 0907   MCHC 32.6 12/06/2022 0907   RDW 14.4 12/06/2022 0907  RDW 14.7 07/27/2020 1056   LYMPHSABS 1.6 12/06/2022 0907   LYMPHSABS 1.3 07/27/2020 1056   MONOABS 0.9 12/06/2022 0907   EOSABS 0.7 (H) 12/06/2022 0907   EOSABS 0.3 07/27/2020 1056   BASOSABS 0.1 12/06/2022 0907   BASOSABS 0.1 07/27/2020 1056    CMP     Component Value Date/Time   NA 135 08/30/2022 1029   NA 140 07/26/2022 0821   K 4.0 08/30/2022 1029   CL 99 08/30/2022 1029   CO2 28 08/30/2022 1029   GLUCOSE 87 08/30/2022 1029   BUN 22 08/30/2022 1029   BUN 15 07/26/2022 0821   CREATININE 1.00 08/30/2022 1029   CREATININE 1.21 09/08/2020 1127   CREATININE 1.03 05/02/2016 0946   CALCIUM 9.5 08/30/2022 1029   PROT 6.6 07/26/2022 0821   ALBUMIN 4.1 07/26/2022 0821   AST 15 07/26/2022 0821   AST 20 09/08/2020 1127   ALT 13 07/26/2022 0821   ALT 24 09/08/2020 1127   ALKPHOS 111 07/26/2022 0821   BILITOT 0.4 07/26/2022 0821   BILITOT 1.0 09/08/2020 1127   GFRNONAA >60 09/08/2020 1127   GFRAA 75 07/25/2020 1134      ASSESSMENT and THERAPY PLAN:   Polycythemia, secondary David Brandt is a 77 year old male with 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  Since his last visit here he has been doing remarkably well.  No concerns on review of systems.  Physical examination with obesity, otherwise no new concerns.  No palpable lymphadenopathy or hepatosplenomegaly.  No evidence of hyperviscosity symptoms.  He is now working to lose weight, has been trying to modify his diet, stay active and has started taking Mounjaro.  Review of labs from today shows hematocrit within normal range.  I have recommended that he come back to see [name] in about 6 months or sooner as needed.  All questions were answered. The patient knows to call the clinic with any problems, questions or concerns. We can certainly see the patient much sooner if  necessary.  Total encounter time:20 minutes*in face-to-face visit time, chart review, lab review, care coordination, order entry, and documentation of the encounter time.  *Total Encounter Time as defined by the Centers for Medicare and Medicaid Services includes, in addition to the face-to-face time of a patient visit (documented in the note above) non-face-to-face time: obtaining and reviewing outside history, ordering and reviewing medications, tests or procedures, care coordination (communications with other health care professionals or caregivers) and documentation in the medical record.

## 2022-12-06 NOTE — Assessment & Plan Note (Addendum)
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  Since his last visit here he has been doing remarkably well.  No concerns on review of systems.  Physical examination with obesity, otherwise no new concerns.  No palpable lymphadenopathy or hepatosplenomegaly.  No evidence of hyperviscosity symptoms.  He is now working to lose weight, has been trying to modify his diet, stay active and has started taking Mounjaro.  Review of labs from today shows hematocrit within normal range.  I have recommended that he come back to see [name] in about 6 months or sooner as needed.

## 2022-12-12 DIAGNOSIS — M479 Spondylosis, unspecified: Secondary | ICD-10-CM | POA: Diagnosis not present

## 2022-12-12 DIAGNOSIS — M5116 Intervertebral disc disorders with radiculopathy, lumbar region: Secondary | ICD-10-CM | POA: Diagnosis not present

## 2023-01-07 ENCOUNTER — Telehealth: Payer: Self-pay | Admitting: Pharmacist Clinician (PhC)/ Clinical Pharmacy Specialist

## 2023-01-07 NOTE — Telephone Encounter (Signed)
LMOM to determine if still on Mounjaro, what dose.

## 2023-01-08 NOTE — Telephone Encounter (Signed)
 Patient was returning call. Please advise ?

## 2023-01-29 ENCOUNTER — Telehealth (HOSPITAL_BASED_OUTPATIENT_CLINIC_OR_DEPARTMENT_OTHER): Payer: Self-pay | Admitting: *Deleted

## 2023-01-29 ENCOUNTER — Encounter: Payer: Self-pay | Admitting: Cardiovascular Disease

## 2023-01-29 NOTE — Telephone Encounter (Signed)
Pt c/o medication issue:  1. Name of Medication:  tirzepatide [facility])  2. How are you currently taking this medication (dosage and times per day)?   3. Are you having a reaction (difficulty breathing--STAT)?   4. What is your medication issue?   Patient states yesterday [employer] informed him that they would be sending a fax to the office regarding Mounjaro and getting it for less expensive. Patient would like to confirm this fax was received and discuss any updates.

## 2023-01-29 NOTE — Telephone Encounter (Signed)
This encounter was created in error - please disregard.

## 2023-01-29 NOTE — Telephone Encounter (Signed)
Pt c/o medication issue:   1. Name of Medication:  tirzepatide [facility])   2. How are you currently taking this medication (dosage and times per day)?    3. Are you having a reaction (difficulty breathing--STAT)?    4. What is your medication issue?    Patient states yesterday [employer] informed him that they would be sending a fax to the office regarding Mounjaro and getting it for less expensive. Patient would like to confirm this fax was received and discuss any updates.     Spoke with and advised had not received anything at this time Will forward to [employer] prior auth team to follow up to follow up

## 2023-01-31 ENCOUNTER — Other Ambulatory Visit (HOSPITAL_COMMUNITY): Payer: Self-pay

## 2023-02-04 ENCOUNTER — Telehealth: Payer: Self-pay | Admitting: Cardiovascular Disease

## 2023-02-04 NOTE — Telephone Encounter (Signed)
Hi ladies, I see from Melinda's last call yall were helping with this PA

## 2023-02-04 NOTE — Telephone Encounter (Signed)
Calling with the PA for patient. Ref #[id number]. Please advise

## 2023-02-05 ENCOUNTER — Other Ambulatory Visit (HOSPITAL_COMMUNITY): Payer: Self-pay

## 2023-02-06 ENCOUNTER — Other Ambulatory Visit (HOSPITAL_COMMUNITY): Payer: Self-pay

## 2023-02-06 NOTE — Telephone Encounter (Signed)
Pharmacy Patient Advocate Encounter   Received notification from Physician's Office that prior authorization for [facility] is required/requested.   Insurance verification completed.   The patient is insured through [employer] .   Per test claim: The current 28 day co-pay is, $243.62 AS PATIENT IS IN COVERAGE GAP (DONUT HOLE).  No PA needed at this time. This test claim was processed through [facility]- copay amounts may vary at other pharmacies due to pharmacy/plan contracts, or as the patient moves through the different stages of their insurance plan.

## 2023-02-07 NOTE — Telephone Encounter (Signed)
I see he saw you last for his weight loss stuff, just wanted to make sure you saw this and ok to proceed before I called him and let him know! Thanks!

## 2023-02-07 NOTE — Telephone Encounter (Signed)
Have not seen any fax come through on this.  RN reaching out to patient.

## 2023-02-07 NOTE — Telephone Encounter (Signed)
Go ahead and call him.  He has another message about [employer] reaching out - sending [name] a fax on how he can get the medication cheaper (??).  Haven't seen any faxes on our end in the PharmD pool.  Just confirm what dose he needs and you can send in an updated rx, or I will if you need me to Thanks!

## 2023-02-07 NOTE — Telephone Encounter (Signed)
Call attempted to patient, no answer, left detailed message.

## 2023-02-09 ENCOUNTER — Other Ambulatory Visit: Payer: Self-pay | Admitting: Cardiovascular Disease

## 2023-02-12 NOTE — Telephone Encounter (Signed)
Fax received from [facility], patient does not qualify lower tier/copay mount. Attempted to call patient, no answer, left detailed message.

## 2023-02-26 ENCOUNTER — Other Ambulatory Visit: Payer: Self-pay | Admitting: *Deleted

## 2023-02-26 MED ORDER — HYDROCHLOROTHIAZIDE 25 MG PO TABS: 25.0000 mg | ORAL_TABLET | Freq: Every day | ORAL | 1 refills | Status: DC

## 2023-03-01 ENCOUNTER — Other Ambulatory Visit: Payer: Self-pay | Admitting: Cardiovascular Disease

## 2023-03-04 DIAGNOSIS — E119 Type 2 diabetes mellitus without complications: Secondary | ICD-10-CM | POA: Diagnosis not present

## 2023-03-04 DIAGNOSIS — H2513 Age-related nuclear cataract, bilateral: Secondary | ICD-10-CM | POA: Diagnosis not present

## 2023-03-04 DIAGNOSIS — H40013 Open angle with borderline findings, low risk, bilateral: Secondary | ICD-10-CM | POA: Diagnosis not present

## 2023-03-05 ENCOUNTER — Other Ambulatory Visit: Payer: Self-pay | Admitting: Family Medicine

## 2023-03-05 DIAGNOSIS — F419 Anxiety disorder, unspecified: Secondary | ICD-10-CM

## 2023-03-11 ENCOUNTER — Other Ambulatory Visit: Payer: Self-pay | Admitting: Family Medicine

## 2023-03-11 DIAGNOSIS — E1169 Type 2 diabetes mellitus with other specified complication: Secondary | ICD-10-CM

## 2023-03-21 DIAGNOSIS — K136 Irritative hyperplasia of oral mucosa: Secondary | ICD-10-CM | POA: Diagnosis not present

## 2023-05-06 ENCOUNTER — Encounter: Payer: Self-pay | Admitting: Internal Medicine

## 2023-05-16 ENCOUNTER — Other Ambulatory Visit: Payer: Self-pay | Admitting: Family Medicine

## 2023-05-17 ENCOUNTER — Encounter: Payer: Self-pay | Admitting: Family Medicine

## 2023-05-20 MED ORDER — BUDESONIDE-FORMOTEROL FUMARATE 160-4.5 MCG/ACT IN AERO: 2.0000 | INHALATION_SPRAY | Freq: Two times a day (BID) | RESPIRATORY_TRACT | 0 refills | Status: DC

## 2023-06-01 ENCOUNTER — Telehealth: Payer: Self-pay | Admitting: Hematology and Oncology

## 2023-06-01 NOTE — Telephone Encounter (Signed)
 Left patient a vm regarding upcoming appointment

## 2023-06-07 ENCOUNTER — Other Ambulatory Visit: Payer: Medicare HMO

## 2023-06-07 ENCOUNTER — Ambulatory Visit: Payer: Medicare HMO | Admitting: Hematology and Oncology

## 2023-07-04 ENCOUNTER — Encounter: Payer: Self-pay | Admitting: Acute Care

## 2023-07-07 ENCOUNTER — Other Ambulatory Visit: Payer: Self-pay | Admitting: Cardiovascular Disease

## 2023-07-08 ENCOUNTER — Other Ambulatory Visit: Payer: Self-pay | Admitting: *Deleted

## 2023-07-08 DIAGNOSIS — D751 Secondary polycythemia: Secondary | ICD-10-CM

## 2023-07-09 ENCOUNTER — Inpatient Hospital Stay: Payer: Medicare HMO | Admitting: Hematology and Oncology

## 2023-07-09 ENCOUNTER — Inpatient Hospital Stay: Payer: Medicare HMO | Attending: Hematology and Oncology

## 2023-07-09 VITALS — BP 128/52 | HR 63 | Temp 98.3°F | Resp 17 | Wt 273.8 lb

## 2023-07-09 DIAGNOSIS — G4730 Sleep apnea, unspecified: Secondary | ICD-10-CM | POA: Insufficient documentation

## 2023-07-09 DIAGNOSIS — Z87891 Personal history of nicotine dependence: Secondary | ICD-10-CM | POA: Diagnosis not present

## 2023-07-09 DIAGNOSIS — D751 Secondary polycythemia: Secondary | ICD-10-CM | POA: Diagnosis not present

## 2023-07-09 DIAGNOSIS — R634 Abnormal weight loss: Secondary | ICD-10-CM | POA: Insufficient documentation

## 2023-07-09 DIAGNOSIS — Z800 Family history of malignant neoplasm of digestive organs: Secondary | ICD-10-CM | POA: Diagnosis not present

## 2023-07-09 LAB — CBC WITH DIFFERENTIAL (CANCER CENTER ONLY)
Abs Immature Granulocytes: 0.02 10*3/uL (ref 0.00–0.07)
Basophils Absolute: 0.1 10*3/uL (ref 0.0–0.1)
Basophils Relative: 1 %
Eosinophils Absolute: 0.6 10*3/uL — ABNORMAL HIGH (ref 0.0–0.5)
Eosinophils Relative: 7 %
HCT: 47.3 % (ref 39.0–52.0)
Hemoglobin: 15.2 g/dL (ref 13.0–17.0)
Immature Granulocytes: 0 %
Lymphocytes Relative: 17 %
Lymphs Abs: 1.5 10*3/uL (ref 0.7–4.0)
MCH: 29.1 pg (ref 26.0–34.0)
MCHC: 32.1 g/dL (ref 30.0–36.0)
MCV: 90.4 fL (ref 80.0–100.0)
Monocytes Absolute: 1 10*3/uL (ref 0.1–1.0)
Monocytes Relative: 12 %
Neutro Abs: 5.4 10*3/uL (ref 1.7–7.7)
Neutrophils Relative %: 63 %
Platelet Count: 193 10*3/uL (ref 150–400)
RBC: 5.23 MIL/uL (ref 4.22–5.81)
RDW: 14.2 % (ref 11.5–15.5)
WBC Count: 8.6 10*3/uL (ref 4.0–10.5)
nRBC: 0 % (ref 0.0–0.2)

## 2023-07-09 NOTE — Progress Notes (Signed)
 David Brandt:    David Brandt LABOR, MD [address] [place] [address]  DIAGNOSIS: Secondary Polycythemia  SUMMARY OF HEMATOLOGIC HISTORY:  #1.  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  Monthly CBC and phlebotomy if hematocrit greater than 51--last 06/08/2021  CURRENT THERAPY: intermittent phlebotomy  INTERVAL HISTORY:  David Brandt 78 y.o. male returns for f/u of his polcythemia.    Discussed the use of AI scribe software for clinical note transcription with the patient, who gave verbal consent to proceed.  History of Present Illness    The patient, with a history of polycythemia secondary to testosterone  therapy, presents for a follow-Brandt visit. He reports overall good health and has not seen any other doctors since his last visit. He has not required any phlebotomies since his last visit and has discontinued testosterone  therapy. He is currently on Mounjaro , but has had difficulty obtaining it due to insurance issues. He plans to resume it with the new insurance year. He has not started any new medications. He also reports using a CPAP machine nightly for sleep apnea. He has been actively trying to lose weight and has seen some success. Rest of the pertinent 10 point ROS reviewed and neg.   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  MEDICAL HISTORY: Past Medical History:  Diagnosis Date   Abnormal cardiovascular stress test 03/29/2016   Allergic rhinitis    Allergy    Arthritis    Asthma    Back pain    Diabetes (HCC) 03/29/2016   Gout    History of kidney stones    Hyperlipidemia    Hypertension    Joint pain    Multiple food allergies    Prediabetes    Sleep apnea    cpap    SURGICAL HISTORY: Past Surgical History:  Procedure Laterality Date   CARDIAC CATHETERIZATION N/A 03/29/2016   Procedure: Right/Left Heart Cath and Coronary Angiography;  Surgeon: Lonni Hanson, MD;  Location: [facility] INVASIVE CV LAB;  Service: Cardiovascular;  Laterality: N/A;   CARDIAC CATHETERIZATION N/A 03/29/2016   Procedure: Intravascular Pressure Wire/FFR Study;  Surgeon: Lonni Hanson, MD;  Location: [facility] INVASIVE CV LAB;  Service: Cardiovascular;  Laterality: N/A;   CARDIAC CATHETERIZATION Left 04/02/2016   Procedure: FEMORAL ARTERIAL LINE INSERTION;  Surgeon: Dallas KATHEE Jude, MD;  Location: [facility] OR;  Service: Open Heart Surgery;  Laterality: Left;   COLONOSCOPY  02/20/2018   per Dr. Avram, no polyps, repeat in 5 yrs (previous adenomatous polyps)    CORONARY ARTERY BYPASS GRAFT N/A 04/02/2016   Procedure: CORONARY ARTERY BYPASS GRAFTING (CABG) X 5 UTILIZING LEFT INTERNAL MAMMARY ARTERY AND ENDOSCOPICALLY HARVESTED GREATER SAPHENOUS  VEIN ; Mammary to LAD, Sequencial 1st to 2nd Diagonal, Vein to OM, Vein to Distal RCA.;  Surgeon: Dallas KATHEE Jude, MD;  Location: [facility] OR;  Service: Open Heart Surgery;  Laterality: N/A;   CYSTOSCOPY/URETEROSCOPY/HOLMIUM LASER/STENT PLACEMENT Bilateral 05/07/2019   Procedure: LEFT URETEROSCOPY/HOLMIUM LASER/STENT PLACEMENT/BIOPSY LEFT  MID URETER/RIGHT RETROGRADE STENT PLACEMENT ;  Surgeon: Cam Morene ORN, MD;  Location: [facility] ORS;  Service: Urology;  Laterality: Bilateral;   CYSTOSCOPY/URETEROSCOPY/HOLMIUM LASER/STENT PLACEMENT Bilateral 05/27/2019   Procedure: CYSTOSCOPY, URETEROSCOPY/ RETROGRADE PYELOGRAM/ HOLMIUM LASER/STENT EXCHANGE;  Surgeon: Cam Morene ORN, MD;  Location: [facility] ORS;  Service: Urology;  Laterality: Bilateral;   LYMPH NODE BIOPSY Left 04/02/2016   Procedure: INCIDENTAL LEFT MAMMARY LYMPH NODE BIOPSY;  Surgeon: Dallas KATHEE Jude, MD;  Location: [facility] OR;  Service: Open Heart Surgery;  Laterality: Left;   mass abdomen  1955   NEPHROLITHOTOMY Left 04/24/2019   Procedure: NEPHROLITHOTOMY PERCUTANEOUS WITH SURGEON ACCESS;  Surgeon: Cam Morene ORN, MD;  Location: [facility] ORS;  Service: Urology;  Laterality: Left;   TEE WITHOUT CARDIOVERSION N/A 04/02/2016   Procedure: TRANSESOPHAGEAL ECHOCARDIOGRAM (TEE);  Surgeon: Dallas KATHEE Jude, MD;  Location: [facility] OR;  Service: Open Heart Surgery;  Laterality: N/A;    SOCIAL HISTORY: Social History   Socioeconomic History   Marital status: Married    Spouse name: Erminio   Number of children: Y   Years of education: Not on file   Highest education level: Not on file  Occupational History   Not on file  Tobacco Use   Smoking status: Former    Current packs/day: 0.00    Average packs/day: 1 pack/day for 47.0 years (47.0 ttl pk-yrs)    Types: Cigarettes    Start date: 06/26/1971    Quit date: 06/25/2018    Years since quitting: 5.0    Passive exposure: Past   Smokeless tobacco: Never   Tobacco comments:    Started smoking at age 64 smoked pack a day on average   Vaping Use   Vaping status: Never Used  Substance and Sexual Activity   Alcohol use: Not Currently    Comment: occaional shot of vodka   Drug use: Yes    Frequency: 7.0 times per week    Types: Marijuana    Comment: marijuana-states still smoking 10/19/2020   Sexual activity: Not on file  Other Topics  Concern   Not on file  Social History Narrative   Not on file   Social Drivers of Health   Financial Resource Strain: Low Risk  (08/13/2022)   Overall Financial Resource Strain (CARDIA)    Difficulty of Paying Living Expenses: Not hard at all  Food Insecurity: No Food Insecurity (08/13/2022)   Hunger Vital Sign    Worried About Running Out of Food in the Last Year: Never true    Ran Out of Food in the Last Year: Never true  Transportation Needs: No Transportation Needs (08/13/2022)   PRAPARE - Administrator, Civil Service (Medical): No    Lack of Transportation (Non-Medical): No  Physical Activity: Insufficiently Active (08/13/2022)   Exercise Vital Sign    Days of Exercise per Week: 2 days    Minutes of Exercise per Session: 20 min  Stress: No Stress Concern Present (08/13/2022)   [employer] of Occupational Health - Occupational Stress Questionnaire    Feeling of Stress : Not at all  Social Connections: Moderately Isolated (08/13/2022)   Social Connection and Isolation Panel [NHANES]    Frequency of Communication  with Friends and Family: More than three times a week    Frequency of Social Gatherings with Friends and Family: More than three times a week    Attends Religious Services: Never    Database Administrator or Organizations: No    Attends Banker Meetings: Never    Marital Status: Married  Catering Manager Violence: Not At Risk (08/13/2022)   Humiliation, Afraid, Rape, and Kick questionnaire    Fear of Current or Ex-Partner: No    Emotionally Abused: No    Physically Abused: No    Sexually Abused: No    FAMILY HISTORY: Family History  Problem Relation Age of Onset   Colon cancer Father 24   Cancer Father    Alcohol abuse Father    Stroke Mother    Diabetes Mother    Hypertension Mother    Hyperlipidemia Mother    Obesity Mother    Heart disease Maternal Aunt    Diabetes Maternal Uncle    Stomach cancer Neg Hx    Esophageal cancer  Neg Hx    Rectal cancer Neg Hx     Review of Systems  Constitutional:  Negative for appetite change, chills, fatigue, fever and unexpected weight change.  HENT:   Negative for hearing loss, lump/mass and trouble swallowing.   Eyes:  Negative for eye problems and icterus.  Respiratory:  Negative for chest tightness, cough and shortness of breath.   Cardiovascular:  Negative for chest pain, leg swelling and palpitations.  Gastrointestinal:  Negative for abdominal distention, abdominal pain, constipation, diarrhea, nausea and vomiting.  Endocrine: Negative for hot flashes.  Genitourinary:  Negative for difficulty urinating.   Musculoskeletal:  Negative for arthralgias.  Skin:  Negative for itching and rash.  Neurological:  Negative for dizziness, extremity weakness, headaches and numbness.  Hematological:  Negative for adenopathy. Does not bruise/bleed easily.  Psychiatric/Behavioral:  Negative for depression. The patient is not nervous/anxious.       PHYSICAL EXAMINATION  ECOG PERFORMANCE STATUS: 1 - Symptomatic but completely ambulatory  Vitals:   07/09/23 1056  BP: (!) 128/52  Pulse: 63  Resp: 17  Temp: 98.3 F (36.8 C)  SpO2: 98%    Physical Exam Constitutional:      General: He is not in acute distress.    Appearance: Normal appearance. He is not toxic-appearing.  HENT:     Head: Normocephalic and atraumatic.  Eyes:     General: No scleral icterus. Cardiovascular:     Rate and Rhythm: Normal rate and regular rhythm.     Pulses: Normal pulses.     Heart sounds: Normal heart sounds.  Pulmonary:     Effort: Pulmonary effort is normal.     Breath sounds: Normal breath sounds.  Abdominal:     General: Abdomen is flat. Bowel sounds are normal. There is no distension.     Palpations: Abdomen is soft.     Tenderness: There is no abdominal tenderness.  Musculoskeletal:        General: No swelling.     Cervical back: Neck supple.  Lymphadenopathy:     Cervical: No  cervical adenopathy.  Skin:    General: Skin is warm and dry.     Findings: No rash.  Neurological:     General: No focal deficit present.     Mental Status: He is alert.  Psychiatric:        Mood and Affect: Mood normal.        Behavior: Behavior  normal.     LABORATORY DATA:  CBC    Component Value Date/Time   WBC 8.6 07/09/2023 0942   WBC 12.4 (H) 08/30/2022 1029   RBC 5.23 07/09/2023 0942   HGB 15.2 07/09/2023 0942   HGB 20.1 (HH) 07/27/2020 1056   HCT 47.3 07/09/2023 0942   HCT 59.9 (H) 07/27/2020 1056   PLT 193 07/09/2023 0942   PLT 195 07/27/2020 1056   MCV 90.4 07/09/2023 0942   MCV 92 07/27/2020 1056   MCH 29.1 07/09/2023 0942   MCHC 32.1 07/09/2023 0942   RDW 14.2 07/09/2023 0942   RDW 14.7 07/27/2020 1056   LYMPHSABS 1.5 07/09/2023 0942   LYMPHSABS 1.3 07/27/2020 1056   MONOABS 1.0 07/09/2023 0942   EOSABS 0.6 (H) 07/09/2023 0942   EOSABS 0.3 07/27/2020 1056   BASOSABS 0.1 07/09/2023 0942   BASOSABS 0.1 07/27/2020 1056    CMP     Component Value Date/Time   NA 135 08/30/2022 1029   NA 140 07/26/2022 0821   K 4.0 08/30/2022 1029   CL 99 08/30/2022 1029   CO2 28 08/30/2022 1029   GLUCOSE 87 08/30/2022 1029   BUN 22 08/30/2022 1029   BUN 15 07/26/2022 0821   CREATININE 1.00 08/30/2022 1029   CREATININE 1.21 09/08/2020 1127   CREATININE 1.03 05/02/2016 0946   CALCIUM  9.5 08/30/2022 1029   PROT 6.6 07/26/2022 0821   ALBUMIN  4.1 07/26/2022 0821   AST 15 07/26/2022 0821   AST 20 09/08/2020 1127   ALT 13 07/26/2022 0821   ALT 24 09/08/2020 1127   ALKPHOS 111 07/26/2022 0821   BILITOT 0.4 07/26/2022 0821   BILITOT 1.0 09/08/2020 1127   GFRNONAA >60 09/08/2020 1127   GFRAA 75 07/25/2020 1134      ASSESSMENT and THERAPY PLAN:   Polycythemia, secondary Yossef is a 78 year old male with history of 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 Testosterone  discontinued and patient has no plans  to restart. -No further phlebotomies required at this time. -Return to clinic if testosterone  therapy is restarted.  Sleep Apnea Patient reports consistent use of CPAP. -Continue CPAP use nightly.  Insurance coverage for Mounjaro  Patient reports insurance coverage issues. -Advise patient to contact insurance provider to discuss coverage for the new year.  Weight loss Patient reports intentional weight loss. -Encourage continued healthy lifestyle habits.  Follow-Brandt No further appointments scheduled at this time. -Contact clinic if testosterone  therapy is restarted or if there are any changes in health status.   All questions were answered. The patient knows to call the clinic with any problems, questions or concerns. We can certainly see the patient much sooner if necessary.  Total encounter time:20 minutes*in face-to-face visit time, chart review, lab review, care coordination, order entry, and documentation of the encounter time.  *Total Encounter Time as defined by the Centers for Medicare and Medicaid Services includes, in addition to the face-to-face time of a patient visit (documented in the note above) non-face-to-face time: obtaining and reviewing outside history, ordering and reviewing medications, tests or procedures, care coordination (communications with other health care professionals or caregivers) and documentation in the medical record.

## 2023-07-09 NOTE — Assessment & Plan Note (Addendum)
 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 Testosterone  discontinued and patient has no plans to restart. -No further phlebotomies required at this time. -Return to clinic if testosterone  therapy is restarted.  Sleep Apnea Patient reports consistent use of CPAP. -Continue CPAP use nightly.  Insurance coverage for Mounjaro  Patient reports insurance coverage issues. -Advise patient to contact insurance provider to discuss coverage for the new year.  Weight loss Patient reports intentional weight loss. -Encourage continued healthy lifestyle habits.  Follow-up No further appointments scheduled at this time. -Contact clinic if testosterone  therapy is restarted or if there are any changes in health status.

## 2023-07-11 ENCOUNTER — Other Ambulatory Visit: Payer: Self-pay | Admitting: Family Medicine

## 2023-07-11 ENCOUNTER — Telehealth: Payer: Self-pay | Admitting: Pharmacist Clinician (PhC)/ Clinical Pharmacy Specialist

## 2023-07-11 ENCOUNTER — Encounter: Payer: Self-pay | Admitting: Pharmacist Clinician (PhC)/ Clinical Pharmacy Specialist

## 2023-07-11 DIAGNOSIS — E1169 Type 2 diabetes mellitus with other specified complication: Secondary | ICD-10-CM

## 2023-07-11 MED ORDER — BUDESONIDE-FORMOTEROL FUMARATE 160-4.5 MCG/ACT IN AERO: 2.0000 | INHALATION_SPRAY | Freq: Two times a day (BID) | RESPIRATORY_TRACT | 0 refills | Status: AC

## 2023-07-11 NOTE — Telephone Encounter (Signed)
 Error

## 2023-07-12 MED ORDER — MOUNJARO 7.5 MG/0.5ML ~~LOC~~ SOAJ
7.5000 mg | SUBCUTANEOUS | 1 refills | Status: DC
Start: 2023-07-12 — End: 2023-11-27

## 2023-07-17 ENCOUNTER — Ambulatory Visit: Payer: Self-pay | Admitting: Family Medicine

## 2023-07-17 DIAGNOSIS — J189 Pneumonia, unspecified organism: Secondary | ICD-10-CM | POA: Diagnosis not present

## 2023-07-17 DIAGNOSIS — R062 Wheezing: Secondary | ICD-10-CM | POA: Diagnosis not present

## 2023-07-17 NOTE — Telephone Encounter (Signed)
Copied from CRM [id number]. Topic: Clinical - Red Word Triage >> Jul 17, 2023 11:13 AM Deaijah H wrote: Red Word that prompted transfer to Nurse Triage: Chest tightness/uncomfortable   Chief Complaint: Chest Pain Symptoms: Cough, Chest Pain, Shortness of Breath Frequency: Acute Disposition: [x] ED /[] Urgent Care (no appt availability in office) / [] Appointment(In office/virtual)/ []  [facility]/ [] Home Care/ [] Refused Recommended Disposition /[] [facility]/ []  Follow-up with PCP Additional Notes: G. Brzoska is a 5 year ld male being triaged today for chest pain and difficulty breathing. History of Hypertension, Hyperlipidemia, and COPD. Chest Pain is central, constant, and does not radiate. Referred patient to the ER, patient agreed to dispositon.    Reason for Disposition  Difficulty breathing  Answer Assessment - Initial Assessment Questions 1. LOCATION: "Where does it hurt?"       Middle of chest  2. RADIATION: "Does the pain go anywhere else?" (e.g., into neck, jaw, arms, back)     No  3. ONSET: "When did the chest pain begin?" (Minutes, hours or days)      Yesterday  4. PATTERN: "Does the pain come and go, or has it been constant since it started?"  "Does it get worse with exertion?"      Constant  5. DURATION: "How long does it last" (e.g., seconds, minutes, hours)     Constant  6. SEVERITY: "How bad is the pain?"  (e.g., Scale 1-10; mild, moderate, or severe)    - MILD (1-3): doesn't interfere with normal activities     - MODERATE (4-7): interferes with normal activities or awakens from sleep    - SEVERE (8-10): excruciating pain, unable to do any normal activities       6  7. CARDIAC RISK FACTORS: "Do you have any history of heart problems or risk factors for heart disease?" (e.g., angina, prior heart attack; diabetes, high blood pressure, high cholesterol, smoker, or strong family history of heart disease)     Hypertension, Hyperlipidemia  8.  PULMONARY RISK FACTORS: "Do you have any history of lung disease?"  (e.g., blood clots in lung, asthma, emphysema, birth control pills)     COPD  9. CAUSE: "What do you think is causing the chest pain?"     Unsure  10. OTHER SYMPTOMS: "Do you have any other symptoms?" (e.g., dizziness, nausea, vomiting, sweating, fever, difficulty breathing, cough)       Difficulty Breathing, Cough  Protocols used: Chest Pain-A-AH

## 2023-07-18 NOTE — Telephone Encounter (Signed)
Pt has appointment for 07/19/23

## 2023-07-19 ENCOUNTER — Encounter: Payer: Self-pay | Admitting: Family Medicine

## 2023-07-19 ENCOUNTER — Ambulatory Visit (INDEPENDENT_AMBULATORY_CARE_PROVIDER_SITE_OTHER): Payer: Medicare HMO | Admitting: Family Medicine

## 2023-07-19 VITALS — BP 110/58 | HR 66 | Temp 98.1°F | Wt 266.0 lb

## 2023-07-19 DIAGNOSIS — E1169 Type 2 diabetes mellitus with other specified complication: Secondary | ICD-10-CM

## 2023-07-19 DIAGNOSIS — N390 Urinary tract infection, site not specified: Secondary | ICD-10-CM | POA: Diagnosis not present

## 2023-07-19 DIAGNOSIS — J309 Allergic rhinitis, unspecified: Secondary | ICD-10-CM | POA: Diagnosis not present

## 2023-07-19 DIAGNOSIS — Z7985 Long-term (current) use of injectable non-insulin antidiabetic drugs: Secondary | ICD-10-CM

## 2023-07-19 DIAGNOSIS — J40 Bronchitis, not specified as acute or chronic: Secondary | ICD-10-CM

## 2023-07-19 LAB — POCT GLYCOSYLATED HEMOGLOBIN (HGB A1C): Hemoglobin A1C: 5.8 % — AB (ref 4.0–5.6)

## 2023-07-19 NOTE — Addendum Note (Signed)
Addended by: Carola Rhine on: 07/19/2023 02:35 PM   Modules accepted: Orders

## 2023-07-19 NOTE — Progress Notes (Signed)
   Subjective:    Patient ID: David Brandt, male    DOB: 1946-01-10, 78 y.o.   MRN: [id number]  HPI Here for several issues. First he says he began having more frequent urinations about 6 months ago, and he was getting up to urinate 4-5 times every night. He also had urgency to urinate, though he had no fever or burning. Then several week sago he also developed chest congestion, wheezing, SOB, and a dry cough. He went to an [facility] urgent care on 07-16-22, and he has wheezes on exam. His labs showed a normal WBC at 8.1 and a mildly elevated BNP at 125. A CXR showed his lungs were clear but there were small bilateral pleural effusions. He was diagnosed with pneumonia, and he was stated on a Zpack and a 10 day course of Augmentin. Now his breathing has greatly improved. The cough has lessened, and he is no longer SOB. He has also noticed that his urinary urgency is gone, and his nocturia is back down to his baseline of 1-2 times a night. We attempted to check his urine today, but he was not able to provide a sample. An A1c today is 5.8%. In addition to these issues, he has had a stuffy nose for several months, and he asks what he can for this.    Review of Systems  Constitutional: Negative.   HENT: Negative.    Eyes: Negative.   Respiratory:  Positive for cough, shortness of breath and wheezing.   Cardiovascular: Negative.   Gastrointestinal: Negative.   Genitourinary:  Positive for frequency and urgency. Negative for dysuria, flank pain and hematuria.       Objective:   Physical Exam Constitutional:      Appearance: Normal appearance. He is not ill-appearing.  Cardiovascular:     Rate and Rhythm: Normal rate and regular rhythm.     Pulses: Normal pulses.     Heart sounds: Normal heart sounds.  Pulmonary:     Effort: Pulmonary effort is normal.     Breath sounds: Normal breath sounds.  Abdominal:     Tenderness: There is no right CVA tenderness or left CVA tenderness.  Neurological:      Mental Status: He is alert.           Assessment & Plan:  He has a bronchitis, and this seems to be responding well to the Zpack and Augmentin. He will finish out the full 10 days worth. He likely had a UTI as well, probably a prostatitis, and the antibiotics seem to be helping this also . He will finish the antibiotics, and he will return if the urinary symptoms come back. Lastly he has allergic rhinitis, and I suggested he try Flonase sprays daily. We spent a total of (34   ) minutes reviewing records and discussing these issues.  Gershon Crane, MD

## 2023-07-22 ENCOUNTER — Other Ambulatory Visit (HOSPITAL_BASED_OUTPATIENT_CLINIC_OR_DEPARTMENT_OTHER): Payer: Self-pay | Admitting: Cardiovascular Disease

## 2023-07-22 NOTE — Progress Notes (Unsigned)
Cardiology Office Note:  .   Date:  07/25/2023  ID:  David Brandt, DOB 26-Jul-1945, MRN [id number] PCP: Nelwyn Salisbury, MD  [facility] Providers Cardiologist:  Chilton Si, MD  History of Present Illness: David Brandt is a 78 y.o. male with a past medical history of CAD s/p CABG, post op afib, HTN, OSA, COPD, type 2 DM. Patient is followed by Dr. Duke Salvia and presents today for an annual follow up appointment.  Patient previously underwent nuclear stress test in 03/2016 for evaluation of shortness of breath. Stress test showed a large inferior and inferior lateral wall MI from apex to base without ischemia, EF 48%. Patient underwent R/L heart catheterization on 03/29/16 that showed severe 3 vessel coronary artery disease. Echocardiogram 03/30/16 showed EF 55-60%, moderate LVH. Patient was seen by CT surgery and ultimately underwent CABG with LIMA-LAD, SVG-D1 and D2, SVG-OM, and SVG-RCA. After his surgery, patient developed post-operative atrial fibrillation and was started on amiodarone. Amiodarone was later discontinued in 07/2016.   In 03/2019, patient was evaluated for dyspnea. Stress test 03/27/19 was a normal study with no ischemia or infarction. Echocardiogram 03/30/19 showed EF 65-70%, no LVH, normal RV function.   Patient was last seen by Dr. [name] 06/2022. At that time, patient completed of shortness of breath on exertion. Also complained of wheezing. His carvedilol was transitioned to metoprolol. Remained on felodipine, hydralazine, hydrochlorothiazide, and spironolactone. Also on aspirin and lipitor.   Today, patient presents for a follow-up visit after a recent episode of bronchitis. Two weeks prior, he experienced difficulty breathing and was initially suspected to have pneumonia, but was ultimately diagnosed with bronchitis. He was treated with a 10-day course of  medication (unsure which medication), and he had dramatic improvement in symptoms. He reports  feeling significantly better since the treatment, with no ongoing cough or shortness of breath. Denies feelings of dizziness or faintness. He denies any chest pain during the episode, describing his symptoms as feeling short of breath and generally unwell, but without fever or chills.  Patient has a history of hypertension and hyperlipidemia, for which he is on medication. His blood pressure is well-controlled, and his cholesterol was last checked a year ago. He has not eaten today, so a cholesterol check will be performed. He also takes [facility] for weight loss and reports losing about 25 pounds in two weeks since restarting the medication after a brief period off due to cost. He reports reduced cravings, particularly for sweets.  Jamarie uses a CPAP machine nightly for sleep apnea and finds it beneficial. He also reports a slight wheeze, which is not unusual for him. He denies any swelling in his ankles.    ROS: Denies shortness of breath, chest pain, syncope, near syncope, ankle swelling, palpitations.   Studies Reviewed: .   Cardiac Studies & Procedures   CARDIAC CATHETERIZATION  CARDIAC CATHETERIZATION 03/29/2016  Narrative Conclusions: 1. Severe 3 vessel coronary artery disease, as detailed below, including 50% distal LMCA/ostial LAD stenoses (FFR 0.70), 80% D1 lesion, 80% ostial LCx stenosis, and diffusely diseased RCA with 99% mid RCA stenosis with TIMI-1 flow and competitive filling of the PDA and rPL branches by left-to-right collaterals. 2. Mildly decreased LV systolic function with inferior akinesis (LVEF 45-50%) 3. Upper normal to mildly elevated left heart filling pressures. 4. Reduced Fick cardiac output.  Recommendations: 1. Transfer to 3W-stepdown for monitoring in the setting of unstable angina with severe three-vessel CAD.  Initiate heparin infusion 4 hours after TR band  deflated. 2. Cardiac surgery consultation in AM to evaluate for CABG. 3. Consider viability study to  further assess the inferior wall prior to revascularization. 4. Transthoracic echocardiogram to evaluate for significant valvular disease. 5. Continue ASA and metoprolol; will increase atorvastatin to 80 mg daily.  Findings Coronary Findings Diagnostic  Dominance: Right  Left Main The lesion is calcified. Pressure wire/FFR was performed on the lesion. FFR: 0.7.  Left Anterior Descending Vessel is large. There is mild the vessel. The lesion is calcified. Pressure wire/FFR was performed on the lesion. FFR: 0.7.  First Diagonal Branch Vessel is moderate in size. The lesion is irregular.  Second Diagonal Branch Vessel is moderate in size.  Ramus Intermedius Vessel is large. The vessel exhibits minimal luminal irregularities.  Left Circumflex Vessel is large.  First Obtuse Marginal Branch Vessel is small in size.  Second Obtuse Marginal Branch Vessel is moderate in size.  Third Obtuse Marginal Branch Vessel is moderate in size.  Right Coronary Artery Vessel is large. There is moderate the vessel. with left-to-right collateral flow. The lesion is calcified. TIMI-1 flow into distal vessel.  PDA and rPL branches also fill via left-to-right collaterals.  Right Posterior Descending Artery Vessel is moderate in size. Collaterals RPDA filled by collaterals from 3rd Sept.  Right Posterior Atrioventricular Artery Vessel is moderate in size.  Intervention  No interventions have been documented.   STRESS TESTS  MYOCARDIAL PERFUSION IMAGING 03/27/2019  Narrative  Nuclear stress EF: 58%.  There was no ST segment deviation noted during stress.  The study is normal.  This is a low risk study.  The left ventricular ejection fraction is normal (55-65%).  Normal stress nuclear study with no ischemia or infarction.  Gated ejection fraction 58% with normal wall motion.  ECHOCARDIOGRAM  ECHOCARDIOGRAM COMPLETE 03/30/2019  Narrative ECHOCARDIOGRAM REPORT    Patient  Name:   David Brandt Date of Exam: 03/30/2019 Medical Rec #:  [id number]         Height:       69.0 in Accession #:    [id number]        Weight:       330.0 lb Date of Birth:  12-May-1946         BSA:          2.56 m Patient Age:    73 years          BP:           156/82 mmHg Patient Gender: M                 HR:           76 bpm. Exam Location:  [address]  Procedure: 2D Echo, 3D Echo, Cardiac Doppler and Color Doppler  Indications:    R06.02 Shortness of breath  History:        Patient has prior history of Echocardiogram examinations, most recent 12/17/2017. Prior CABG; COPD Risk Factors:Hypertension, Sleep Apnea, Diabetes, Dyslipidemia, Former Smoker and Morbid obesity.  Sonographer:    Garald Braver, RDCS Referring Phys: [id number] [facility]  IMPRESSIONS   1. Left ventricular ejection fraction, by visual estimation, is 65 to 70%. The left ventricle has normal function. Normal left ventricular size. There is no left ventricular hypertrophy. 2. Left ventricular diastolic Doppler parameters are consistent with pseudonormalization pattern of LV diastolic filling. 3. Global right ventricle has normal systolic function.The right ventricular size is normal. No increase in right ventricular wall thickness. 4. Left atrial size was  normal. 5. Right atrial size was normal. 6. The mitral valve is normal in structure. No evidence of mitral valve regurgitation. No evidence of mitral stenosis. 7. The tricuspid valve is normal in structure. Tricuspid valve regurgitation was not visualized by color flow Doppler. 8. The aortic valve is normal in structure. Aortic valve regurgitation was not visualized by color flow Doppler. Mild aortic valve sclerosis without stenosis. 9. The pulmonic valve was normal in structure. Pulmonic valve regurgitation is not visualized by color flow Doppler. 10. The inferior vena cava is normal in size with greater than 50% respiratory variability, suggesting right  atrial pressure of 3 mmHg.  In comparison to the previous echocardiogram(s): 12/17/17 EF 55-60%. FINDINGS Left Ventricle: Left ventricular ejection fraction, by visual estimation, is 65 to 70%. The left ventricle has normal function. There is no left ventricular hypertrophy. Normal left ventricular size. Spectral Doppler shows Left ventricular diastolic Doppler parameters are consistent with pseudonormalization pattern of LV diastolic filling.  Right Ventricle: The right ventricular size is normal. No increase in right ventricular wall thickness. Global RV systolic function is has normal systolic function.  Left Atrium: Left atrial size was normal in size.  Right Atrium: Right atrial size was normal in size  Pericardium: There is no evidence of pericardial effusion.  Mitral Valve: The mitral valve is normal in structure. No evidence of mitral valve stenosis by observation. No evidence of mitral valve regurgitation.  Tricuspid Valve: The tricuspid valve is normal in structure. Tricuspid valve regurgitation was not visualized by color flow Doppler.  Aortic Valve: The aortic valve is normal in structure. Aortic valve regurgitation was not visualized by color flow Doppler. Mild aortic valve sclerosis is present, with no evidence of aortic valve stenosis.  Pulmonic Valve: The pulmonic valve was normal in structure. Pulmonic valve regurgitation is not visualized by color flow Doppler.  Aorta: The aortic root, ascending aorta and aortic arch are all structurally normal, with no evidence of dilitation or obstruction.  Venous: The inferior vena cava is normal in size with greater than 50% respiratory variability, suggesting right atrial pressure of 3 mmHg.  IAS/Shunts: No atrial level shunt detected by color flow Doppler. No ventricular septal defect is seen or detected. There is no evidence of an atrial septal defect.    LEFT VENTRICLE PLAX 2D LVIDd:         5.10 cm  Diastology LVIDs:          3.60 cm  LV e' lateral:   7.83 cm/s LV PW:         1.10 cm  LV E/e' lateral: 13.3 LV IVS:        1.10 cm  LV e' medial:    7.62 cm/s LVOT diam:     2.00 cm  LV E/e' medial:  13.6 LV SV:         69 ml LV SV Index:   24.94 LVOT Area:     3.14 cm  3D Volume EF: 3D EF:        65 % LV EDV:       185 ml LV ESV:       65 ml LV SV:        120 ml  RIGHT VENTRICLE RV Basal diam:  3.40 cm RV S prime:     10.30 cm/s TAPSE (M-mode): 1.8 cm  LEFT ATRIUM             Index       RIGHT ATRIUM  Index LA diam:        4.90 cm 1.92 cm/m  RA Pressure: 3.00 mmHg LA Vol (A2C):   53.8 ml 21.05 ml/m RA Area:     14.10 cm LA Vol (A4C):   57.3 ml 22.42 ml/m RA Volume:   31.20 ml  12.21 ml/m LA Biplane Vol: 56.4 ml 22.07 ml/m AORTIC VALVE LVOT Vmax:   158.00 cm/s LVOT Vmean:  105.000 cm/s LVOT VTI:    0.305 m  AORTA Ao Root diam: 3.70 cm Ao Asc diam:  3.40 cm  MITRAL VALVE                         TRICUSPID VALVE Estimated RAP:  3.00 mmHg  MV Decel Time: 236 msec              SHUNTS MV E velocity: 104.00 cm/s 103 cm/s  Systemic VTI:  0.30 m MV A velocity: 80.00 cm/s  70.3 cm/s Systemic Diam: 2.00 cm MV E/A ratio:  1.30        1.5   Donato Schultz MD Electronically signed by Donato Schultz MD Signature Date/Time: 03/30/2019/11:46:00 AM    Final  TEE  ECHO TEE 04/02/2016  Interpretation Summary  Left ventricle: Normal cavity size. LV systolic function is mildly reduced with an EF of 45-50%. There are no obvious wall motion abnormalities.  Septum: No Patent Foramen Ovale present.  Left atrium: Patent foramen ovale not present.  Left atrium: Cavity is mildly dilated.  Aortic valve: The valve is trileaflet. Mild valve thickening present. No stenosis. No regurgitation.  Mitral valve: Mild to moderate regurgitation.  Right ventricle: Normal ejection fraction. Normal tricuspid annular plane systolic excursion (TAPSE).  Tricuspid valve: Mild regurgitation. The tricuspid valve  regurgitation jet is central.            Risk Assessment/Calculations:             Physical Exam:   VS:  BP 120/70 (BP Location: Left Arm, Patient Position: Sitting, Cuff Size: Large)   Pulse 72   Ht 5\' 8"  (1.727 m)   Wt 260 lb 9.6 oz (118.2 kg)   SpO2 100%   BMI 39.62 kg/m    Wt Readings from Last 3 Encounters:  07/25/23 260 lb 9.6 oz (118.2 kg)  07/19/23 266 lb (120.7 kg)  07/09/23 273 lb 12.8 oz (124.2 kg)    GEN: Well nourished, well developed in no acute distress. Sitting upright in the chair  NECK: No JVD CARDIAC: RRR, no murmurs, rubs, gallops. Radial pulses 2+ bilaterally  RESPIRATORY:  End expiratory wheezing present, otherwise clear. Normal WOB on room air  ABDOMEN: Soft, non-tender, non-distended EXTREMITIES:  No edema in BLE; No deformity   ASSESSMENT AND PLAN: .    HTN  - Current regiment includes felodipine 10 mg daily, hydralazine 50 mg BID, hydrochlorothiazide 25 mg daily, metoprolol succinate 25 mg daily, spironolactone 25 mg daily  - BP well controlled. Denies symptoms of orthostatic hypotension  - Continue current medications  - Ordered BMP for med monitoring   CAD s/p CABG - Patient previously underwent CABG in 2017 with LIMA-LAD, SVG-D1 and D2, SVG-OM, and SVG-RCA - Most recent stress test from 03/2019 was a normal study  - Denies chest pain, DOE.  - EKG today is without ischemic changes  - Continue ASA 81 mg daily, lipitor 80 mg daily, metoprolol succinate 25 mg daily   HLD  - Lipid panel from 07/2022 showed LDL 50,  HDL 41, triglycerides 156, total cholesterol 117 - Ordered lipid panel today for monitoring. Patient is fasting  - Continue lipitor 80 mg daily   OSA  - Reports excellent compliance with CPAP   COPD - Followed by PCP. Had recent episode of bronchitis, but has recovered well - Denies SOB   Morbid Obesity  - Patient now on [place]jaro. Followed by our pharmacists   Dispo: Follow up with Dr. [name] in 10/2023 as scheduled    Signed, Jonita Albee, PA-C

## 2023-07-25 ENCOUNTER — Ambulatory Visit: Payer: Medicare HMO | Attending: Cardiology | Admitting: Cardiology

## 2023-07-25 ENCOUNTER — Encounter: Payer: Self-pay | Admitting: Cardiology

## 2023-07-25 VITALS — BP 120/70 | HR 72 | Ht 68.0 in | Wt 260.6 lb

## 2023-07-25 DIAGNOSIS — E785 Hyperlipidemia, unspecified: Secondary | ICD-10-CM | POA: Diagnosis not present

## 2023-07-25 DIAGNOSIS — G4733 Obstructive sleep apnea (adult) (pediatric): Secondary | ICD-10-CM | POA: Diagnosis not present

## 2023-07-25 DIAGNOSIS — Z951 Presence of aortocoronary bypass graft: Secondary | ICD-10-CM | POA: Diagnosis not present

## 2023-07-25 DIAGNOSIS — I10 Essential (primary) hypertension: Secondary | ICD-10-CM

## 2023-07-25 DIAGNOSIS — J449 Chronic obstructive pulmonary disease, unspecified: Secondary | ICD-10-CM | POA: Diagnosis not present

## 2023-07-25 NOTE — Patient Instructions (Signed)
Medication Instructions:  No changes *If you need a refill on your cardiac medications before your next appointment, please call your pharmacy*  Lab Work: Today we are going to draw CMP and Lipid Panel If you have labs (blood work) drawn today and your tests are completely normal, you will receive your results only by: MyChart Message (if you have MyChart) OR A paper copy in the mail If you have any lab test that is abnormal or we need to change your treatment, we will call you to review the results.  Testing/Procedures: No testing  Follow-Up: At [facility], you and your health needs are our priority.  As part of our continuing mission to provide you with exceptional heart care, we have created designated Provider Care Teams.  These Care Teams include your primary Cardiologist (physician) and Advanced Practice Providers (APPs -  Physician Assistants and Nurse Practitioners) who all work together to provide you with the care you need, when you need it.  We recommend signing up for the patient portal called "MyChart".  Sign up information is provided on this After Visit Summary.  MyChart is used to connect with patients for Virtual Visits (Telemedicine).  Patients are able to view lab/test results, encounter notes, upcoming appointments, etc.  Non-urgent messages can be sent to your provider as well.   To learn more about what you can do with MyChart, go to [email or web address].    Your next appointment:   May 7th at 9:20 am with Chilton Si, MD

## 2023-07-26 ENCOUNTER — Telehealth: Payer: Self-pay

## 2023-07-26 LAB — COMPREHENSIVE METABOLIC PANEL
ALT: 19 [IU]/L (ref 0–44)
AST: 25 [IU]/L (ref 0–40)
Albumin: 4.1 g/dL (ref 3.8–4.8)
Alkaline Phosphatase: 117 [IU]/L (ref 44–121)
BUN/Creatinine Ratio: 12 (ref 10–24)
BUN: 15 mg/dL (ref 8–27)
Bilirubin Total: 0.6 mg/dL (ref 0.0–1.2)
CO2: 26 mmol/L (ref 20–29)
Calcium: 9.8 mg/dL (ref 8.6–10.2)
Chloride: 101 mmol/L (ref 96–106)
Creatinine, Ser: 1.26 mg/dL (ref 0.76–1.27)
Globulin, Total: 2.7 g/dL (ref 1.5–4.5)
Glucose: 89 mg/dL (ref 70–99)
Potassium: 3.9 mmol/L (ref 3.5–5.2)
Sodium: 142 mmol/L (ref 134–144)
Total Protein: 6.8 g/dL (ref 6.0–8.5)
eGFR: 59 mL/min/{1.73_m2} — ABNORMAL LOW (ref >59)

## 2023-07-26 LAB — LIPID PANEL
Chol/HDL Ratio: 3.1 {ratio} (ref 0.0–5.0)
Cholesterol, Total: 135 mg/dL (ref 100–199)
HDL: 44 mg/dL (ref >39)
LDL Chol Calc (NIH): 62 mg/dL (ref 0–99)
Triglycerides: 172 mg/dL — ABNORMAL HIGH (ref 0–149)
VLDL Cholesterol Cal: 29 mg/dL (ref 5–40)

## 2023-07-26 NOTE — Telephone Encounter (Signed)
 Left message to call back

## 2023-07-26 NOTE — Telephone Encounter (Signed)
-----   Message from Jonita Albee sent at 07/26/2023  8:14 AM EST ----- Please tell patient that his lab work showed overall normal/stable kidney function, normal electrolytes, and normal liver function on his current medications.  His LDL (bad cholesterol) is 62, which is well controlled. Note, triglycerides are a bit elevated. Recommend avoiding fatty/fried foods, heavy creams, butter.   No changes to current medications or treatment plan. Follow up as arranged

## 2023-07-31 ENCOUNTER — Other Ambulatory Visit: Payer: Self-pay | Admitting: Cardiovascular Disease

## 2023-07-31 MED ORDER — HYDRALAZINE HCL 50 MG PO TABS: 50.0000 mg | ORAL_TABLET | Freq: Two times a day (BID) | ORAL | 0 refills | Status: DC

## 2023-08-01 ENCOUNTER — Other Ambulatory Visit: Payer: Self-pay | Admitting: Family Medicine

## 2023-08-01 ENCOUNTER — Other Ambulatory Visit (HOSPITAL_BASED_OUTPATIENT_CLINIC_OR_DEPARTMENT_OTHER): Payer: Self-pay | Admitting: Cardiovascular Disease

## 2023-08-05 NOTE — Telephone Encounter (Signed)
-----   Message from Jonita Albee sent at 07/26/2023  8:14 AM EST ----- Please tell patient that his lab work showed overall normal/stable kidney function, normal electrolytes, and normal liver function on his current medications.  His LDL (bad cholesterol) is 62, which is well controlled. Note, triglycerides are a bit elevated. Recommend avoiding fatty/fried foods, heavy creams, butter.   No changes to current medications or treatment plan. Follow up as arranged

## 2023-08-05 NOTE — Telephone Encounter (Signed)
 Left message to call back

## 2023-08-06 NOTE — Telephone Encounter (Signed)
Left message to call back

## 2023-08-09 NOTE — Telephone Encounter (Signed)
Called patient advised of below they verbalized understanding.

## 2023-08-13 ENCOUNTER — Other Ambulatory Visit (HOSPITAL_BASED_OUTPATIENT_CLINIC_OR_DEPARTMENT_OTHER): Payer: Self-pay | Admitting: Cardiovascular Disease

## 2023-09-21 ENCOUNTER — Other Ambulatory Visit (HOSPITAL_BASED_OUTPATIENT_CLINIC_OR_DEPARTMENT_OTHER): Payer: Self-pay | Admitting: Family

## 2023-10-03 ENCOUNTER — Telehealth: Payer: Self-pay | Admitting: *Deleted

## 2023-10-03 ENCOUNTER — Other Ambulatory Visit: Payer: Self-pay

## 2023-10-03 ENCOUNTER — Ambulatory Visit: Payer: Medicare PPO | Admitting: Family Medicine

## 2023-10-03 DIAGNOSIS — Z87891 Personal history of nicotine dependence: Secondary | ICD-10-CM

## 2023-10-03 DIAGNOSIS — Z122 Encounter for screening for malignant neoplasm of respiratory organs: Secondary | ICD-10-CM | POA: Diagnosis not present

## 2023-10-03 DIAGNOSIS — Z0000 Encounter for general adult medical examination without abnormal findings: Secondary | ICD-10-CM

## 2023-10-03 NOTE — Telephone Encounter (Signed)
 Copied from CRM [id number]. Topic: General - Other >> Oct 03, 2023 10:12 AM Brennan Bailey S wrote: Reason for CRM: patient calling to schedule his ct scan or lung cancer screening, he is not sure.  Spoke to patient and scheduled lung screening CT 10/15/23.

## 2023-10-03 NOTE — Patient Instructions (Addendum)
 I really enjoyed getting to talk with you today! I am available on Tuesdays and Thursdays for virtual visits if you have any questions or concerns, or if I can be of any further assistance.   CHECKLIST FROM ANNUAL WELLNESS VISIT:  -Follow up (please call to schedule if not scheduled after visit):   -schedule routine follow up in the office in the next 3-4 months, request foot exam, diabetic kidney exam and hep C screening if you would like to do   -yearly for annual wellness visit with primary care office  Here is a list of your preventive care/health maintenance measures and the plan for each if any are due:  PLAN For any measures below that may be due:   -call to schedule your colon cancer [phone] -call to schedule your lung cancer screening:[phone] -can get your tetanus booster at the pharmacy, please let us know if you do -can get the foot exam and labs at your next in office visit - please schedule regular follow up and request at visit -please ask you eye doctor to send you eye exam report to Dr. Clent Ridges Thanks!  Health Maintenance  Topic Date Due   FOOT EXAM  Never done   Hepatitis C Screening  Never done   DTaP/Tdap/Td (1 - Tdap) Never done   Diabetic kidney evaluation - Urine ACR  07/25/2021   Lung Cancer Screening  10/19/2021   OPHTHALMOLOGY EXAM  11/22/2021   Colonoscopy  02/21/2023   HEMOGLOBIN A1C  01/16/2024   INFLUENZA VACCINE  01/24/2024   COVID-19 Vaccine (11 - 2024-25 season) 03/19/2024   Diabetic kidney evaluation - eGFR measurement  07/24/2024   Medicare Annual Wellness (AWV)  10/02/2024   Pneumonia Vaccine 32+ Years old  Completed   Zoster Vaccines- Shingrix  Completed   HPV VACCINES  Aged Out   Meningococcal B Vaccine  Aged Out    -See a dentist at least yearly  -Get your eyes checked and then per your eye specialist's recommendations  -Other issues addressed today:   -I have included below further information regarding a healthy  whole foods based diet, physical activity guidelines for adults, stress management and opportunities for social connections. I hope you find this information useful.   -----------------------------------------------------------------------------------------------------------------------------------------------------------------------------------------------------------------------------------------------------------    NUTRITION: -eat real food: lots of colorful vegetables (half the plate) and fruits -5-7 servings of vegetables and fruits per day (fresh or steamed is best), exp. 2 servings of vegetables with lunch and dinner and 2 servings of fruit per day. Berries and greens such as kale and collards are great choices.  -consume on a regular basis:  fresh fruits, fresh veggies, fish, nuts, seeds, healthy oils (such as olive oil, avocado oil), whole grains (make sure for bread/pasta/crackers/etc., that the first ingredient on label contains the word "whole"), legumes. -can eat small amounts of dairy and lean meat (no larger than the palm of your hand), but avoid processed meats such as ham, bacon, lunch meat, etc. -drink water -try to avoid fast food and pre-packaged foods, processed meat, ultra processed foods/beverages (donuts, candy, etc.) -most experts advise limiting sodium to < 2300mg  per day, should limit further is any chronic conditions such as high blood pressure, heart disease, diabetes, etc. The American Heart Association advised that < 1500mg  is is ideal -try to avoid foods/beverages that contain any ingredients with names you do not recognize  -try to avoid foods/beverages  with added sugar or sweeteners/sweets  -try to avoid sweet drinks (including diet drinks): soda, juice,  Gatorade, sweet tea, power drinks, diet drinks -try to avoid white rice, white bread, pasta (unless whole grain)  EXERCISE GUIDELINES FOR ADULTS: -if you wish to increase your physical activity, do so gradually  and with the approval of your doctor -STOP and seek medical care immediately if you have any chest pain, chest discomfort or trouble breathing when starting or increasing exercise  -move and stretch your body, legs, feet and arms when sitting for long periods -Physical activity guidelines for optimal health in adults: -get at least 150 minutes per week of moderate exercise (can talk, but not sing); this is about 20-30 minutes of sustained activity 5-7 days per week or two 10-15 minute episodes of sustained activity 5-7 days per week -do some muscle building/resistance training/strength training at least 2 days per week  -balance exercises 3+ days per week:   Stand somewhere where you have something sturdy to hold onto if you lose balance    1) lift up on toes, then back down, start with 5x per day and work up to 20x   2) stand and lift one leg straight out to the side so that foot is a few inches of the floor, start with 5x each side and work up to 20x each side   3) stand on one foot, start with 5 seconds each side and work up to 20 seconds on each side  If you need ideas or help with getting more active:  -Silver sneakers https://tools.silversneakers.com  -Walk with a Doc: http://www.duncan-[email or web address]/  -try to include resistance (weight lifting/strength building) and balance exercises twice per week: or the following link for ideas: http://castillo-powell.com/  BuyDucts.dk  STRESS MANAGEMENT: -can try meditating, or just sitting quietly with deep breathing while intentionally relaxing all parts of your body for 5 minutes daily -if you need further help with stress, anxiety or depression please follow up with your primary doctor or contact the wonderful folks at [facility]: [phone]  SOCIAL CONNECTIONS: -options in [place] if you wish to engage in more social and exercise related  activities:  -Silver sneakers https://tools.silversneakers.com  -Walk with a Doc: http://www.duncan-[email or web address]/  -Check out the [facility] Active Adults 50+ section on the [place] [employer] (hiking clubs, book clubs, cards and games, chess, exercise classes, aquatic classes and much more) - see the website for details: https://www.[place].gov/departments/parks-recreation/active-adults50  -YouTube has lots of exercise videos for different ages and abilities as well  -[facility] (a variety of indoor and outdoor inperson activities for adults). [phone]. [address]  -Virtual Online Classes (a variety of topics): see seniorplanet.org or call [phone]  -consider volunteering at a school, hospice center, church, senior center or elsewhere  ADVANCED HEALTHCARE DIRECTIVES:  [place] Advanced Directives assistance:   ExpressWeek.com.cy  Everyone should have advanced health care directives in place. This is so that you get the care you want, should you ever be in a situation where you are unable to make your own medical decisions.   From the [place] Advanced Directive Website: "Advance Health Care Directives are legal documents in which you give written instructions about your health care if, in the future, you cannot speak for yourself.   A health care power of attorney allows you to name a person you trust to make your health care decisions if you cannot make them yourself. A declaration of a desire for a natural death (or living will) is document, which states that you desire not to have your life prolonged by extraordinary measures if  you have a terminal or incurable illness or if you are in a vegetative state. An advance instruction for mental health treatment makes a declaration of instructions, information and preferences regarding your mental health treatment. It also states that you are aware that  the advance instruction authorizes a mental health treatment provider to act according to your wishes. It may also outline your consent or refusal of mental health treatment. A declaration of an anatomical gift allows anyone over the age of 68 to make a gift by will, organ donor card or other document."   Please see the following website or an elder law attorney for forms, FAQs and for completion of advanced directives: [place] Health Care Directives Advance Health Care Directives (http://guzman.com/)  Or copy and paste the following to your web browser: PoshChat.fi

## 2023-10-03 NOTE — Progress Notes (Signed)
 PATIENT CHECK-IN and HEALTH RISK ASSESSMENT QUESTIONNAIRE:  -completed by phone/video for upcoming Medicare Preventive Visit  Pre-Visit Check-in: 1)Vitals (height, wt, BP, etc) - record in vitals section for visit on day of visit Request home vitals (wt, BP, etc.) and enter into vitals, THEN update Vital Signs SmartPhrase below at the top of the HPI. See below.  2)Review and Update Medications, Allergies PMH, Surgeries, Social history in Epic 3)Hospitalizations in the last year with date/reason? n  4)Review and Update Care Team (patient's specialists) in Epic 5) Complete PHQ9 in Epic  6) Complete Fall Screening in Epic 7)Review all Health Maintenance Due and order under PCP if not done.  Medicare Wellness Patient Questionnaire:  Answer theses question about your habits: How often do you have a drink containing alcohol?2-3 times per month How many drinks containing alcohol do you have on a typical day when you are drinking? 2-3 drinks How often do you have six or more drinks on one occasion? never Have you ever smoked? Quit smoking about 5 years ago How many packs a day do/did you smoke? 1ppd for 30 years Do you use smokeless tobacco?n Do you use an illicit drugs? A little pot every now and then On average, how many days per week do you engage in moderate to strenuous exercise (like a brisk walk)? Does about 20-30 minutes a day in yard  Typical breakfast: toast and sausage, eggs Typical lunch/dinner: protein (chicken), veggie (lots of greens and cabbage) Typical snacks:fruit  Beverages: water  Answer theses question about your everyday activities: Can you perform most household chores?y Are you deaf or have significant trouble hearing?n Do you feel that you have a problem with memory?n Do you feel safe at home? y Last dentist visit? dentures 8. Do you have any difficulty performing your everyday activities?n Are you having any difficulty walking, taking medications on your own,  and or difficulty managing daily home needs?n Do you have difficulty walking or climbing stairs?n Do you have difficulty dressing or bathing?n Do you have difficulty doing errands alone such as visiting a doctor's office or shopping?n Do you currently have any difficulty preparing food and eating?n Do you currently have any difficulty using the toilet?n Do you have any difficulty managing your finances?n Do you have any difficulties with housekeeping of managing your housekeeping?n   Do you have Advanced Directives in place (Living Will, Healthcare Power or Attorney)? No, wife and him are planning to do with attorney   Last eye Exam and location? [facility], report goes yearly   Do you currently use prescribed or non-prescribed narcotic or opioid pain medications? n  Do you have a history or close family history of breast, ovarian, tubal or peritoneal cancer or a family member with BRCA (breast cancer susceptibility 1 and 2) gene mutations? n    ----------------------------------------------------------------------------------------------------------------------------------------------------------------------------------------------------------------------  Because this visit was a virtual/telehealth visit, some criteria may be missing or patient reported. Any vitals not documented were not able to be obtained and vitals that have been documented are patient reported.    MEDICARE ANNUAL PREVENTIVE CARE VISIT WITH PROVIDER (Welcome to Medicare, initial annual wellness or annual wellness exam)  Virtual Visit via Phone Note  I connected with David Brandt on 10/03/23  by phone and verified that I am speaking with the correct person using two identifiers. Patient declined video visit and prefers to do phone visit.   Location patient: home Location provider:work or home office Persons participating in the virtual visit: patient, provider  Concerns  and/or follow up today: reports  all is going well.   David Brandt has a past medical history of Abnormal cardiovascular stress test (03/29/2016), Allergic rhinitis, Allergy, Arthritis, Asthma, Back pain, Diabetes (HCC) (03/29/2016), Gout, History of kidney stones, Hyperlipidemia, Hypertension, Joint pain, Multiple food allergies, Prediabetes, and Sleep apnea.  See HM section in Epic for other details of completed HM.    ROS: negative for report of fevers, unintentional weight loss, vision changes, vision loss, hearing loss or change, chest pain, sob, hemoptysis, melena, hematochezia, hematuria, falls, bleeding or bruising, thoughts of suicide or self harm, memory loss  Patient-completed extensive health risk assessment - reviewed and discussed with the patient: See Health Risk Assessment completed with patient prior to the visit either above or in recent phone note. This was reviewed in detailed with the patient today and appropriate recommendations, orders and referrals were placed as needed per Summary below and patient instructions.   Review of Medical History: -PMH, PSH, Family History and current specialty and care providers reviewed and updated and listed below   Patient Care Team: Nelwyn Salisbury, MD as PCP - General (Family Medicine) Chilton Si, MD as PCP - Cardiology (Cardiology) Derenda Mis, PA-C (Inactive) (Dermatology) Daneen Schick, OD (Optometry)   Past Medical History:  Diagnosis Date   Abnormal cardiovascular stress test 03/29/2016   Allergic rhinitis    Allergy    Arthritis    Asthma    Back pain    Diabetes (HCC) 03/29/2016   Gout    History of kidney stones    Hyperlipidemia    Hypertension    Joint pain    Multiple food allergies    Prediabetes    Sleep apnea    cpap    Past Surgical History:  Procedure Laterality Date   CARDIAC CATHETERIZATION N/A 03/29/2016   Procedure: Right/Left Heart Cath and Coronary Angiography;  Surgeon: Yvonne Kendall, MD;  Location: [facility] INVASIVE CV  LAB;  Service: Cardiovascular;  Laterality: N/A;   CARDIAC CATHETERIZATION N/A 03/29/2016   Procedure: Intravascular Pressure Wire/FFR Study;  Surgeon: Yvonne Kendall, MD;  Location: [facility] INVASIVE CV LAB;  Service: Cardiovascular;  Laterality: N/A;   CARDIAC CATHETERIZATION Left 04/02/2016   Procedure: FEMORAL ARTERIAL LINE INSERTION;  Surgeon: Delight Ovens, MD;  Location: [facility] OR;  Service: Open Heart Surgery;  Laterality: Left;   COLONOSCOPY  02/20/2018   per Dr. 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   Procedure: CORONARY ARTERY BYPASS GRAFTING (CABG) X 5 UTILIZING LEFT INTERNAL MAMMARY ARTERY AND ENDOSCOPICALLY HARVESTED GREATER SAPHENOUS VEIN ; Mammary to LAD, Sequencial 1st to 2nd Diagonal, Vein to OM, Vein to Distal RCA.;  Surgeon: Delight Ovens, MD;  Location: [facility] OR;  Service: Open Heart Surgery;  Laterality: N/A;   CYSTOSCOPY/URETEROSCOPY/HOLMIUM LASER/STENT PLACEMENT Bilateral 05/07/2019   Procedure: LEFT URETEROSCOPY/HOLMIUM LASER/STENT PLACEMENT/BIOPSY LEFT MID URETER/RIGHT RETROGRADE STENT PLACEMENT ;  Surgeon: Crist Fat, MD;  Location: [facility] ORS;  Service: Urology;  Laterality: Bilateral;   CYSTOSCOPY/URETEROSCOPY/HOLMIUM LASER/STENT PLACEMENT Bilateral 05/27/2019   Procedure: CYSTOSCOPY, URETEROSCOPY/ RETROGRADE PYELOGRAM/ HOLMIUM LASER/STENT EXCHANGE;  Surgeon: Crist Fat, MD;  Location: [facility] ORS;  Service: Urology;  Laterality: Bilateral;   LYMPH NODE BIOPSY Left 04/02/2016   Procedure: INCIDENTAL LEFT MAMMARY LYMPH NODE BIOPSY;  Surgeon: Delight Ovens, MD;  Location[facility] OR;  Service: Open Heart Surgery;  Laterality: Left;   mass abdomen  1955   NEPHROLITHOTOMY Left 04/24/2019   Procedure:  NEPHROLITHOTOMY PERCUTANEOUS WITH SURGEON ACCESS;  Surgeon: Crist Fat, MD;  Location: [facility] ORS;  Service: Urology;  Laterality: Left;   TEE WITHOUT CARDIOVERSION N/A 04/02/2016   Procedure:  TRANSESOPHAGEAL ECHOCARDIOGRAM (TEE);  Surgeon: Delight Ovens, MD;  Location: [facility] OR;  Service: Open Heart Surgery;  Laterality: N/A;    Social History   Socioeconomic History   Marital status: Married    Spouse name: Steward Drone   Number of children: Y   Years of education: Not on file   Highest education level: Not on file  Occupational History   Not on file  Tobacco Use   Smoking status: Former    Current packs/day: 0.00    Average packs/day: 1 pack/day for 47.0 years (47.0 ttl pk-yrs)    Types: Cigarettes    Start date: 06/26/1971    Quit date: 06/25/2018    Years since quitting: 5.2    Passive exposure: Past   Smokeless tobacco: Never   Tobacco comments:    Started smoking at age 70 smoked pack a day on average   Vaping Use   Vaping status: Never Used  Substance and Sexual Activity   Alcohol use: Not Currently    Comment: occaional shot of vodka   Drug use: Yes    Frequency: 7.0 times per week    Types: Marijuana    Comment: marijuana-states still smoking 10/19/2020   Sexual activity: Not on file  Other Topics Concern   Not on file  Social History Narrative   Not on file   Social Drivers of Health   Financial Resource Strain: Low Risk  (08/13/2022)   Overall Financial Resource Strain (CARDIA)    Difficulty of Paying Living Expenses: Not hard at all  Food Insecurity: No Food Insecurity (08/13/2022)   Hunger Vital Sign    Worried About Running Out of Food in the Last Year: Never true    Ran Out of Food in the Last Year: Never true  Transportation Needs: No Transportation Needs (08/13/2022)   PRAPARE - Administrator, Civil Service (Medical): No    Lack of Transportation (Non-Medical): No  Physical Activity: Insufficiently Active (08/13/2022)   Exercise Vital Sign    Days of Exercise per Week: 2 days    Minutes of Exercise per Session: 20 min  Stress: No Stress Concern Present (08/13/2022)   [employer] of Occupational Health - Occupational Stress  Questionnaire    Feeling of Stress : Not at all  Social Connections: Moderately Isolated (08/13/2022)   Social Connection and Isolation Panel [NHANES]    Frequency of Communication with Friends and Family: More than three times a week    Frequency of Social Gatherings with Friends and Family: More than three times a week    Attends Religious Services: Never    Database administrator or Organizations: No    Attends Banker Meetings: Never    Marital Status: Married  Catering manager Violence: Not At Risk (08/13/2022)   Humiliation, Afraid, Rape, and Kick questionnaire    Fear of Current or Ex-Partner: No    Emotionally Abused: No    Physically Abused: No    Sexually Abused: No    Family History  Problem Relation Age of Onset   Colon cancer Father 26   Cancer Father    Alcohol abuse Father    Stroke Mother    Diabetes Mother    Hypertension Mother    Hyperlipidemia Mother    Obesity Mother  Heart disease Maternal Aunt    Diabetes Maternal Uncle    Stomach cancer Neg Hx    Esophageal cancer Neg Hx    Rectal cancer Neg Hx     Current Outpatient Medications on File Prior to Visit  Medication Sig Dispense Refill   albuterol (VENTOLIN HFA) 108 (90 Base) MCG/ACT inhaler Inhale 2 puffs into the lungs every 4 (four) hours as needed for wheezing or shortness of breath. 8 g 5   Alcohol Swabs (DROPSAFE ALCOHOL PREP) 70 % PADS USE AS DIRECTED 300 each 0   Apoaequorin (PREVAGEN PO) Take 20 mg by mouth.     aspirin EC 81 MG tablet Take 81 mg by mouth daily.     atorvastatin (LIPITOR) 80 MG tablet Take 1 tablet (80 mg total) by mouth daily. PLEASE CALL [phone] TO SCHEDULE A FOLLOW VISIT. 90 tablet 1   budesonide-formoterol (SYMBICORT) 160-4.5 MCG/ACT inhaler Inhale 2 puffs into the lungs 2 (two) times daily. 30.6 each 0   celecoxib (CELEBREX) 200 MG capsule TAKE 1 CAPSULE TWICE DAILY 180 capsule 3   Cholecalciferol (VITAMIN D3) 25 MCG (1000 UT) CAPS Take 2,000 Units by  mouth daily.     felodipine (PLENDIL) 10 MG 24 hr tablet TAKE 1 TABLET BY MOUTH EVERY DAY 90 tablet 1   gabapentin (NEURONTIN) 300 MG capsule TAKE 1 CAPSULE BY MOUTH THREE TIMES A DAY 90 capsule 1   hydrALAZINE (APRESOLINE) 50 MG tablet TAKE 1 TABLET TWICE DAILY 180 tablet 3   hydrochlorothiazide (HYDRODIURIL) 25 MG tablet TAKE 1 TABLET EVERY DAY (APPOINTMENT IS NEEDED FOR REFILLS) 30 tablet 2   metFORMIN (GLUCOPHAGE-XR) 500 MG 24 hr tablet TAKE 1 TABLET EVERY DAY 90 tablet 3   metoprolol succinate (TOPROL-XL) 25 MG 24 hr tablet TAKE 1 TABLET (25 MG TOTAL) BY MOUTH DAILY. 90 tablet 3   OVER THE COUNTER MEDICATION Take 2 capsules by mouth 2 (two) times daily. OmegaXL     POTASSIUM CITRATE PO Take 15 mEq by mouth.     sertraline (ZOLOFT) 50 MG tablet TAKE 2 TABLETS BY MOUTH EVERY DAY 180 tablet 1   spironolactone (ALDACTONE) 25 MG tablet TAKE 1 TABLET EVERY DAY 90 tablet 3   tirzepatide (MOUNJARO) 2.5 MG/0.5ML Pen Inject 2.5 mg into the skin once a week. 2 mL 1   tirzepatide (MOUNJARO) 7.5 MG/0.5ML Pen Inject 7.5 mg into the skin once a week. 2 mL 1   tiZANidine (ZANAFLEX) 2 MG tablet Take 2 mg by mouth 2 (two) times daily as needed.     TRUE METRIX BLOOD GLUCOSE TEST test strip TEST BLOOD SUGAR AS DIRECTED 300 strip 0   No current facility-administered medications on file prior to visit.    Allergies  Allergen Reactions   Other Hives   Yellow Jacket Venom Anaphylaxis   Allopurinol     Joint pain    Justicia Adhatoda (Malabar Nut Tree) [Justicia Adhatoda]     Allergic to Walnuts, hickory nuts, and almonds   Valsartan-Hydrochlorothiazide     Lip swelling    Sulfa Antibiotics Hives       Physical Exam Vitals requested from patient and listed below if patient had equipment and was able to obtain at home for this virtual visit: There were no vitals filed for this visit. Estimated body mass index is 39.62 kg/m as calculated from the following:   Height as of 07/25/23: 5\' 8"  (1.727  m).   Weight as of 07/25/23: 260 lb 9.6 oz (118.2 kg).  EKG (  optional): deferred due to virtual visit  GENERAL: alert, oriented, no acute distress detected; full vision exam deferred due to pandemic and/or virtual encounter  PSYCH/NEURO: pleasant and cooperative, no obvious depression or anxiety, speech and thought processing grossly intact, Cognitive function grossly intact  Flowsheet Row Office Visit from 03/14/2022 in [facility]  PHQ-9 Total Score 0           10/03/2023    9:46 AM 08/13/2022    4:17 PM 03/14/2022    2:54 PM 08/18/2021    1:21 PM 08/07/2021    3:21 PM  Depression screen PHQ 2/9  Decreased Interest 0 0 0 0 0  Down, Depressed, Hopeless 0 0 0 0 0  PHQ - 2 Score 0 0 0 0 0  Altered sleeping   0 0   Tired, decreased energy   0 3   Change in appetite   0 0   Feeling bad or failure about yourself    0 0   Trouble concentrating   0 0   Moving slowly or fidgety/restless   0 0   Suicidal thoughts   0 0   PHQ-9 Score   0 3   Difficult doing work/chores   Not difficult at all         02/09/2022    7:03 AM 03/14/2022    2:54 PM 05/01/2022    1:39 PM 08/13/2022    4:20 PM 10/03/2023    9:45 AM  Fall Risk  Falls in the past year?  0  0 0  Was there an injury with Fall?  0  0 0  Fall Risk Category Calculator  0  0 0  Fall Risk Category (Retired)  Low     (RETIRED) Patient Fall Risk Level Moderate fall risk Moderate fall risk High fall risk    Patient at Risk for Falls Due to  No Fall Risks  No Fall Risks   Fall risk Follow up  Falls evaluation completed  Falls prevention discussed Falls evaluation completed;Education provided     SUMMARY AND PLAN:  Encounter for Medicare annual wellness exam  Screening for lung cancer - Plan: Ambulatory Referral Lung Cancer Screening [facility]   Discussed applicable health maintenance/preventive health measures and advised and referred or ordered per patient preferences: -discussed lung  and colon cancer screening, placed orders for lung cancer screening, provided number for [facility] for him to schedule and he agrees to call today to schedule. -discussed tetanus booster and he says he will get at the pharmacy and then let us know so that we can update chart -discussed eye exam and he reports he will request copy from eye doc -discussed labs due and foot exam and he agrees to request at next in office visit Health Maintenance  Topic Date Due   FOOT EXAM  Never done   Hepatitis C Screening  Never done   DTaP/Tdap/Td (1 - Tdap) Never done   Diabetic kidney evaluation - Urine ACR  07/25/2021   Lung Cancer Screening  10/19/2021   OPHTHALMOLOGY EXAM  11/22/2021   Colonoscopy  02/21/2023   HEMOGLOBIN A1C  01/16/2024   INFLUENZA VACCINE  01/24/2024   COVID-19 Vaccine (11 - 2024-25 season) 03/19/2024   Diabetic kidney evaluation - eGFR measurement  07/24/2024   Medicare Annual Wellness (AWV)  10/02/2024   Pneumonia Vaccine 30+ Years old  Completed   Zoster Vaccines- Shingrix  Completed   HPV VACCINES  Aged  Out   Meningococcal B Vaccine  Aged Out     Education and counseling on the following was provided based on the above review of health and a plan/checklist for the patient, along with additional information discussed, was provided for the patient in the patient instructions :  -Advised on importance of completing advanced directives, discussed options for completing and provided information in patient instructions as well -Advised and counseled on a healthy lifestyle - including the importance of a healthy diet, regular physical activity -Reviewed patient's current diet. Advised and counseled on a whole foods based healthy diet. A summary of a healthy diet was provided in the Patient Instructions.  -reviewed patient's current physical activity level and discussed exercise guidelines for adults. Discussed community resources and ideas for safe exercise at home to  assist in meeting exercise guideline recommendations in a safe and healthy way.  -Advise yearly dental visits at minimum and regular eye exams -Advised and counseled on alcohol safe limits, risks - advised to limit to no more than 1-2 drink in any given day  Follow up: see patient instructions   Patient Instructions  I really enjoyed getting to talk with you today! I am available on Tuesdays and Thursdays for virtual visits if you have any questions or concerns, or if I can be of any further assistance.   CHECKLIST FROM ANNUAL WELLNESS VISIT:  -Follow up (please call to schedule if not scheduled after visit):   -schedule routine follow up in the office in the next 3-4 months, request foot exam, diabetic kidney exam and hep C screening if you would like to do   -yearly for annual wellness visit with primary care office  Here is a list of your preventive care/health maintenance measures and the plan for each if any are due:  PLAN For any measures below that may be due:   -call to schedule your colon cancer [phone] -call to schedule your lung cancer screening:[phone] -can get your tetanus booster at the pharmacy, please let us know if you do -can get the foot exam and labs at your next in office visit - please schedule regular follow up and request at visit -please ask you eye doctor to send you eye exam report to Dr. Clent Ridges Thanks!  Health Maintenance  Topic Date Due   FOOT EXAM  Never done   Hepatitis C Screening  Never done   DTaP/Tdap/Td (1 - Tdap) Never done   Diabetic kidney evaluation - Urine ACR  07/25/2021   Lung Cancer Screening  10/19/2021   OPHTHALMOLOGY EXAM  11/22/2021   Colonoscopy  02/21/2023   HEMOGLOBIN A1C  01/16/2024   INFLUENZA VACCINE  01/24/2024   COVID-19 Vaccine (11 - 2024-25 season) 03/19/2024   Diabetic kidney evaluation - eGFR measurement  07/24/2024   Medicare Annual Wellness (AWV)  10/02/2024   Pneumonia Vaccine 70+ Years old   Completed   Zoster Vaccines- Shingrix  Completed   HPV VACCINES  Aged Out   Meningococcal B Vaccine  Aged Out    -See a dentist at least yearly  -Get your eyes checked and then per your eye specialist's recommendations  -Other issues addressed today:   -I have included below further information regarding a healthy whole foods based diet, physical activity guidelines for adults, stress management and opportunities for social connections. I hope you find this information useful.   -----------------------------------------------------------------------------------------------------------------------------------------------------------------------------------------------------------------------------------------------------------    NUTRITION: -eat real food: lots of colorful vegetables (half the plate) and fruits -5-7 servings of vegetables  and fruits per day (fresh or steamed is best), exp. 2 servings of vegetables with lunch and dinner and 2 servings of fruit per day. Berries and greens such as kale and collards are great choices.  -consume on a regular basis:  fresh fruits, fresh veggies, fish, nuts, seeds, healthy oils (such as olive oil, avocado oil), whole grains (make sure for bread/pasta/crackers/etc., that the first ingredient on label contains the word "whole"), legumes. -can eat small amounts of dairy and lean meat (no larger than the palm of your hand), but avoid processed meats such as ham, bacon, lunch meat, etc. -drink water -try to avoid fast food and pre-packaged foods, processed meat, ultra processed foods/beverages (donuts, candy, etc.) -most experts advise limiting sodium to < 2300mg  per day, should limit further is any chronic conditions such as high blood pressure, heart disease, diabetes, etc. The American Heart Association advised that < 1500mg  is is ideal -try to avoid foods/beverages that contain any ingredients with names you do not recognize  -try to avoid  foods/beverages  with added sugar or sweeteners/sweets  -try to avoid sweet drinks (including diet drinks): soda, juice, Gatorade, sweet tea, power drinks, diet drinks -try to avoid white rice, white bread, pasta (unless whole grain)  EXERCISE GUIDELINES FOR ADULTS: -if you wish to increase your physical activity, do so gradually and with the approval of your doctor -STOP and seek medical care immediately if you have any chest pain, chest discomfort or trouble breathing when starting or increasing exercise  -move and stretch your body, legs, feet and arms when sitting for long periods -Physical activity guidelines for optimal health in adults: -get at least 150 minutes per week of moderate exercise (can talk, but not sing); this is about 20-30 minutes of sustained activity 5-7 days per week or two 10-15 minute episodes of sustained activity 5-7 days per week -do some muscle building/resistance training/strength training at least 2 days per week  -balance exercises 3+ days per week:   Stand somewhere where you have something sturdy to hold onto if you lose balance    1) lift up on toes, then back down, start with 5x per day and work up to 20x   2) stand and lift one leg straight out to the side so that foot is a few inches of the floor, start with 5x each side and work up to 20x each side   3) stand on one foot, start with 5 seconds each side and work up to 20 seconds on each side  If you need ideas or help with getting more active:  -Silver sneakers https://tools.silversneakers.com  -Walk with a Doc: http://www.duncan-williams.com/  -try to include resistance (weight lifting/strength building) and balance exercises twice per week: or the following link for ideas: http://castillo-powell.com/  BuyDucts.dk  STRESS MANAGEMENT: -can try meditating, or just sitting quietly with deep breathing while  intentionally relaxing all parts of your body for 5 minutes daily -if you need further help with stress, anxiety or depression please follow up with your primary doctor or contact the wonderful folks at [facility]: [phone]  SOCIAL CONNECTIONS: -options in [place] if you wish to engage in more social and exercise related activities:  -Silver sneakers https://tools.silversneakers.com  -Walk with a Doc: http://www.duncan-williams.com/  -Check out the [facility] Active Adults 50+ section on the [place] of [employer] (hiking clubs, book clubs, cards and games, chess, exercise classes, aquatic classes and much more) - see the website for details: https://www.[place].gov/departments/parks-recreation/active-adults50  -YouTube has lots of  exercise videos for different ages and abilities as well  -[facility] (a variety of indoor and outdoor inperson activities for adults). [phone]. [address]  -Virtual Online Classes (a variety of topics): see seniorplanet.org or call [phone]  -consider volunteering at a school, hospice center, church, senior center or elsewhere  ADVANCED HEALTHCARE DIRECTIVES:  [place] Advanced Directives assistance:   ExpressWeek.com.cy  Everyone should have advanced health care directives in place. This is so that you get the care you want, should you ever be in a situation where you are unable to make your own medical decisions.   From the [place] Advanced Directive Website: "Advance Health Care Directives are legal documents in which you give written instructions about your health care if, in the future, you cannot speak for yourself.   A health care power of attorney allows you to name a person you trust to make your health care decisions if you cannot make them yourself. A declaration of a desire for a natural death (or living will) is document, which states  that you desire not to have your life prolonged by extraordinary measures if you have a terminal or incurable illness or if you are in a vegetative state. An advance instruction for mental health treatment makes a declaration of instructions, information and preferences regarding your mental health treatment. It also states that you are aware that the advance instruction authorizes a mental health treatment provider to act according to your wishes. It may also outline your consent or refusal of mental health treatment. A declaration of an anatomical gift allows anyone over the age of 94 to make a gift by will, organ donor card or other document."   Please see the following website or an elder law attorney for forms, FAQs and for completion of advanced directives: [place] TEFL teacher Health Care Directives Advance Health Care Directives (http://guzman.[email or web address]/)  Or copy and paste the following to your web browser: PoshChat.fi            Terressa Koyanagi, DO

## 2023-10-15 ENCOUNTER — Ambulatory Visit
Admission: RE | Admit: 2023-10-15 | Discharge: 2023-10-15 | Disposition: A | Discharge status: Home / Self Care | Source: Ambulatory Visit | Attending: Acute Care | Admitting: Acute Care

## 2023-10-15 DIAGNOSIS — Z87891 Personal history of nicotine dependence: Secondary | ICD-10-CM

## 2023-10-15 DIAGNOSIS — Z122 Encounter for screening for malignant neoplasm of respiratory organs: Secondary | ICD-10-CM

## 2023-10-22 ENCOUNTER — Ambulatory Visit: Admitting: Physician Assistant

## 2023-10-26 ENCOUNTER — Other Ambulatory Visit (HOSPITAL_BASED_OUTPATIENT_CLINIC_OR_DEPARTMENT_OTHER): Payer: Self-pay | Admitting: Cardiovascular Disease

## 2023-10-28 ENCOUNTER — Encounter (HOSPITAL_BASED_OUTPATIENT_CLINIC_OR_DEPARTMENT_OTHER): Payer: Self-pay

## 2023-10-29 ENCOUNTER — Other Ambulatory Visit: Payer: Self-pay | Admitting: Family Medicine

## 2023-10-29 ENCOUNTER — Other Ambulatory Visit: Payer: Self-pay | Admitting: Cardiovascular Disease

## 2023-10-29 DIAGNOSIS — F419 Anxiety disorder, unspecified: Secondary | ICD-10-CM

## 2023-10-30 ENCOUNTER — Ambulatory Visit (HOSPITAL_BASED_OUTPATIENT_CLINIC_OR_DEPARTMENT_OTHER): Payer: Medicare HMO | Admitting: Cardiovascular Disease

## 2023-10-30 ENCOUNTER — Encounter (HOSPITAL_BASED_OUTPATIENT_CLINIC_OR_DEPARTMENT_OTHER): Payer: Self-pay | Admitting: Cardiovascular Disease

## 2023-10-30 VITALS — BP 114/58 | HR 71 | Ht 68.0 in | Wt 259.6 lb

## 2023-10-30 DIAGNOSIS — E1169 Type 2 diabetes mellitus with other specified complication: Secondary | ICD-10-CM

## 2023-10-30 DIAGNOSIS — G4733 Obstructive sleep apnea (adult) (pediatric): Secondary | ICD-10-CM | POA: Diagnosis not present

## 2023-10-30 DIAGNOSIS — I10 Essential (primary) hypertension: Secondary | ICD-10-CM | POA: Diagnosis not present

## 2023-10-30 DIAGNOSIS — Z951 Presence of aortocoronary bypass graft: Secondary | ICD-10-CM | POA: Diagnosis not present

## 2023-10-30 DIAGNOSIS — E785 Hyperlipidemia, unspecified: Secondary | ICD-10-CM

## 2023-10-30 MED ORDER — HYDRALAZINE HCL 25 MG PO TABS: 25.0000 mg | ORAL_TABLET | Freq: Two times a day (BID) | ORAL | 3 refills | Status: AC

## 2023-10-30 NOTE — Patient Instructions (Signed)
 Medication Instructions:  Your physician has recommended you make the following change in your medication:   Decrease Hydralazine  25mg  twice daily   Follow-Up:  6 months with Neomi Banks, NP or Slater Duncan, NP   &  1 year with Dr. Theodis Fiscal   Other Instructions Call if you BP starts going back above 130/80.

## 2023-10-30 NOTE — Progress Notes (Signed)
 Cardiology Office Note:  .   Date:  10/30/2023  ID:  David Brandt, DOB [birth year], MRN [id number] PCP: Donley Furth, MD  [facility] Providers Cardiologist:  Maudine Sos, MD    History of Present Illness: David Brandt is a 78 y.o. male with CAD s/p CABG (LIMA-->LAD, SVG-->D1,D2, SVG-->OM, SVG-->RCA) , diabetes, OSA on CPAP, post-operative atrial fibrillation, hypertension, emphysema, and hyperlipidemia who presents for follow.   David Brandt initially presented with chest pressure and shortness of breath 03/2016.  He underwent Lexiscan  Myoview  on 03/28/16 that revealed LVEF 48% with a large inferior and inferior lateral MI and no evidence of ischemia. There was also basal to mid inferior wall akinesis.  He subsequently underwent left heart catheterization where he was found to have severe three-vessel CAD.  He underwent CABG with Dr. Nicanor Barge on 04/02/16.  He developed post-operative atrial fibrillation and was started on amiodarone .  There were were no other postoperative complications and he was discharged 04/11/16.  Echo 03/30/16 revealed LVEF 55-60% and was otherwise unremarkable.  Amiodarone  was discontinued 07/2016.  Hydralazine  was added to his regimen.  He had another stress test 03/2019 that revealed LVEF 58% and no ischemia. ECHO at that time revealed LVEF 65-70% and grade 2 diastolic dysfunction.   On 04/11/20, he reported his blood pressure sometimes got low and he felt palpitations when he took it 3 times a day.  He has been taking it twice a day and this seems to be much better. He also complained of urinary frequency and back pain. He felt that he had not fully recovered from COVID infection 07/2019.   He was seen by Neomi Banks, NP on 03/29/22 for preoperative clearance for L2-L5 laminectomy. EKG showed NSR 47 bpm with no acute ST/T wave changes. GDMT Aspirin , Atorvastatin , Coreg . Coreg  was reduced to 12.5mg  BID. Atorvastatin  80mg  was continued.  He was seen  06/2022 with increased shortness of breath and fatigue.  Weight was up to 291 from 278.  He attributed it to eating more after his surgery.  He was wheezing on exam.  Carvedilol  was switched to metoprolol .  He was encouraged to follow-up with pulmonary.  We started him on Mounjaro .  He saw Liane Redman, PA-C 06/2023 and was doing well other than recovering from bronchitis/pneumonia.  Discussed the use of AI scribe software for clinical note transcription with the patient, who gave verbal consent to proceed.  History of Present Illness David Brandt reports that he has been doing well.  He has no breathing difficulties since switching from carvedilol  to metoprolol . No chest pain or pressure. Blood pressure readings have been in the one-teens to one-twenties. No lightheadedness or dizziness. He does not check his blood pressure regularly at home.  He has been on Mounjaro , 7.5 mg, and has lost 40 pounds, which he feels has positively impacted his well-being. He uses a CPAP machine every night and reports compliance with its use.  He is very active, engaging in yard work and maintaining a regular sleep schedule, going to bed early around 8:30 to 9:00 PM. He enjoys spending time with his grandchild. He experiences some nasal stuffiness due to allergies but does not let it hinder his activities. No breathing difficulties are reported.  ROS:  As per HPI  Studies Reviewed: .       Echo 03/2019:  1. Left ventricular ejection fraction, by visual estimation, is 65 to  70%. The left ventricle has normal function. Normal left ventricular size.  There is no left ventricular hypertrophy.   2. Left ventricular diastolic Doppler parameters are consistent with  pseudonormalization pattern of LV diastolic filling.   3. Global right ventricle has normal systolic function.The right  ventricular size is normal. No increase in right ventricular wall  thickness.   4. Left atrial size was normal.   5. Right  atrial size was normal.   6. The mitral valve is normal in structure. No evidence of mitral valve  regurgitation. No evidence of mitral stenosis.   7. The tricuspid valve is normal in structure. Tricuspid valve  regurgitation was not visualized by color flow Doppler.   8. The aortic valve is normal in structure. Aortic valve regurgitation  was not visualized by color flow Doppler. Mild aortic valve sclerosis  without stenosis.   9. The pulmonic valve was normal in structure. Pulmonic valve  regurgitation is not visualized by color flow Doppler.  10. The inferior vena cava is normal in size with greater than 50%  respiratory variability, suggesting right atrial pressure of 3 mmHg.   Risk Assessment/Calculations:             Physical Exam:   VS:  BP (!) 114/58 (BP Location: Left Arm, Patient Position: Sitting, Cuff Size: Large)   Pulse 71   Ht 5\' 8"  (1.727 m)   Wt 259 lb 9.6 oz (117.8 kg)   SpO2 97%   BMI 39.47 kg/m  , BMI Body mass index is 39.47 kg/m. GENERAL:  Well appearing HEENT: Pupils equal round and reactive, fundi not visualized, oral mucosa unremarkable NECK:  No jugular venous distention, waveform within normal limits, carotid upstroke brisk and symmetric, no bruits, no thyromegaly LUNGS:  Clear to auscultation bilaterally HEART:  RRR.  PMI not displaced or sustained,S1 and S2 within normal limits, no S3, no S4, no clicks, no rubs, not murmurs ABD:  Flat, positive bowel sounds normal in frequency in pitch, no bruits, no rebound, no guarding, no midline pulsatile mass, no hepatomegaly, no splenomegaly EXT:  2 plus pulses throughout, no edema, no cyanosis no clubbing SKIN:  No rashes no nodules NEURO:  Cranial nerves II through XII grossly intact, motor grossly intact throughout PSYCH:  Cognitively intact, oriented to person place and time   ASSESSMENT AND PLAN: .    Assessment & Plan # CAD s/p CABG: Hyperlipidemia Hyperlipidemia well-managed with atorvastatin . LDL  62 mg/dL in January.  Heart disease well-managed. No angina. Breathing improved with metoprolol . - Continue aspirin  and metoprolol . -Continue atorvastatin   # Hypertension Hypertension controlled. Blood pressure  has been running low.  We will try to reduce some of his medications.  BP needs to remain <130/80.  Weight loss noted. - Reduce hydralazine  to 25 mg twice daily. - Continue felodipine , hydrochlorothiazide , metoprolol  and spironolactone .  - Check blood pressure twice daily, maintain under 130/80 mmHg. - If blood pressure >130/80 mmHg, increase hydralazine  back  to 50 mg twice daily.  # Obstructive sleep apnea Managed with nightly CPAP use.  # Obesity:  We improving on Mounjaro  7.5mg  daily.  Continue watching diet and exercising.   # Allergic rhinitis Nasal congestion present, not significantly impacting activities.       Dispo: f/u 6 months with APP.  1 year with me.  Signed, Maudine Sos, MD

## 2023-11-05 ENCOUNTER — Encounter: Payer: Self-pay | Admitting: Internal Medicine

## 2023-11-05 NOTE — Telephone Encounter (Signed)
 F/u on recommended HM measures. Pt did recommended lung cancer screening. Looks like colonoscopy was scheduled and then canceled. Staff contacted pt and he agrees to call GI to reschedule colonoscopy.

## 2023-11-05 NOTE — Telephone Encounter (Signed)
-----   Message from Maurie Southern sent at 10/03/2023 10:12 AM EDT ----- Lung and colon ca screening check

## 2023-11-11 ENCOUNTER — Encounter

## 2023-11-11 ENCOUNTER — Other Ambulatory Visit: Payer: Self-pay

## 2023-11-11 ENCOUNTER — Telehealth: Payer: Self-pay | Admitting: *Deleted

## 2023-11-11 DIAGNOSIS — Z87891 Personal history of nicotine dependence: Secondary | ICD-10-CM

## 2023-11-11 DIAGNOSIS — Z122 Encounter for screening for malignant neoplasm of respiratory organs: Secondary | ICD-10-CM

## 2023-11-11 NOTE — Telephone Encounter (Signed)
 Unable to reach patient for telephone PV. Left instructions to call and reschedule PV to avoid cancellation of colonoscopy.

## 2023-11-12 ENCOUNTER — Encounter: Payer: Self-pay | Admitting: Internal Medicine

## 2023-11-12 ENCOUNTER — Ambulatory Visit (AMBULATORY_SURGERY_CENTER)

## 2023-11-12 VITALS — Ht 68.0 in | Wt 259.0 lb

## 2023-11-12 DIAGNOSIS — Z860100 Personal history of colon polyps, unspecified: Secondary | ICD-10-CM

## 2023-11-12 DIAGNOSIS — Z800 Family history of malignant neoplasm of digestive organs: Secondary | ICD-10-CM

## 2023-11-12 MED ORDER — NA SULFATE-K SULFATE-MG SULF 17.5-3.13-1.6 GM/177ML PO SOLN: 1.0000 | Freq: Once | ORAL | 0 refills | Status: AC

## 2023-11-12 NOTE — Progress Notes (Signed)
 Pre visit completed via phone call; Patient verified name, DOB, and address; No egg or soy allergy known to patient;  No issues known to pt with past sedation with any surgeries or procedures; Patient denies ever being told they had issues or difficulty with intubation;  No FH of Malignant Hyperthermia; Pt is not on diet pills; Pt is not on home 02;  Pt is not on blood thinners;  Pt denies issues with constipation currently- patient advised to increase       No A fib or A flutter; Have any cardiac testing pending--NO Insurance verified during PV appt--- [employer] Medicare Pt can ambulate without assistance;  Pt denies use of chewing tobacco Discussed diabetic/weight loss medication holds; Discussed NSAID holds; Checked BMI to be less than 50; Pt instructed to use Singlecare.com or GoodRx for a price reduction on prep;  Patient's chart reviewed by Rogena Class CNRA prior to previsit and patient appropriate for the [facility];  Pre visit completed and red dot placed by patient's name on their procedure day (on provider's schedule);   Instructions sent to MyChart as well as printed and placed on 2nd floor with receptionist for patient to pick up today per his request;

## 2023-11-19 ENCOUNTER — Encounter: Payer: Self-pay | Admitting: Family Medicine

## 2023-11-19 NOTE — Progress Notes (Unsigned)
 Just checking to see if pt scheduled for colonosocpy. Talked 5/13 and he said was rescheduled by GI. Per chart had previsit with GI on 5/20.

## 2023-11-24 NOTE — Progress Notes (Unsigned)
 [facility] History and Physical   Primary Care Physician:  Donley Furth, MD   Reason for Procedure:  History of colon polyps (adenomatous); father had CRCA at age 76  Plan:    Colonoscopy     HPI: David Brandt is a 78 y.o. male status post removal of adenomas in 2013 and 2016 when he had colonoscopies.  A colonoscopy in 2019 did not show polyps.  He has had 6 adenomas total maximum 8 to 10 mm in size.   Past Medical History:  Diagnosis Date   Abnormal cardiovascular stress test 03/29/2016   Allergic rhinitis    Allergy    Arthritis    Asthma    Back pain    Diabetes (HCC) 03/29/2016   Gout    History of kidney stones    Hyperlipidemia    Hypertension    Joint pain    Multiple food allergies    Prediabetes    Sleep apnea    cpap    Past Surgical History:  Procedure Laterality Date   CARDIAC CATHETERIZATION N/A 03/29/2016   Procedure: Right/Left Heart Cath and Coronary Angiography;  Surgeon: Sammy Crisp, MD;  Location: [facility] INVASIVE CV LAB;  Service: Cardiovascular;  Laterality: N/A;   CARDIAC CATHETERIZATION N/A 03/29/2016   Procedure: Intravascular Pressure Wire/FFR Study;  Surgeon: Sammy Crisp, MD;  Location: [facility] INVASIVE CV LAB;  Service: Cardiovascular;  Laterality: N/A;   CARDIAC CATHETERIZATION Left 04/02/2016   Procedure: FEMORAL ARTERIAL LINE INSERTION;  Surgeon: Norita Beauvais, MD;  Location: [facility] OR;  Service: Open Heart Surgery;  Laterality: Left;   COLONOSCOPY  02/20/2018   per Dr. Willy Harvest, no polyps, repeat in 5 yrs (previous adenomatous polyps)    CORONARY ARTERY BYPASS GRAFT N/A 04/02/2016   Procedure: CORONARY ARTERY BYPASS GRAFTING (CABG) X 5 UTILIZING LEFT INTERNAL MAMMARY ARTERY AND ENDOSCOPICALLY HARVESTED GREATER SAPHENOUS VEIN ; Mammary to LAD, Sequencial 1st to 2nd Diagonal, Vein to OM, Vein to Distal RCA.;  Surgeon: Norita Beauvais, MD;  Location: [facility] OR;  Service: Open Heart Surgery;  Laterality: N/A;    CYSTOSCOPY/URETEROSCOPY/HOLMIUM LASER/STENT PLACEMENT Bilateral 05/07/2019   Procedure: LEFT URETEROSCOPY/HOLMIUM LASER/STENT PLACEMENT/BIOPSY LEFT MID URETER/RIGHT RETROGRADE STENT PLACEMENT ;  Surgeon: Andrez Banker, MD;  Location: [facility] ORS;  Service: Urology;  Laterality: Bilateral;   CYSTOSCOPY/URETEROSCOPY/HOLMIUM LASER/STENT PLACEMENT Bilateral 05/27/2019   Procedure: CYSTOSCOPY, URETEROSCOPY/ RETROGRADE PYELOGRAM/ HOLMIUM LASER/STENT EXCHANGE;  Surgeon: Andrez Banker, MD;  Location: [facility] ORS;  Service: Urology;  Laterality: Bilateral;   LYMPH NODE BIOPSY Left 04/02/2016   Procedure: INCIDENTAL LEFT MAMMARY LYMPH NODE BIOPSY;  Surgeon: Norita Beauvais, MD;  Location: [facility] OR;  Service: Open Heart Surgery;  Laterality: Left;   mass abdomen  1955   NEPHROLITHOTOMY Left 04/24/2019   Procedure: NEPHROLITHOTOMY PERCUTANEOUS WITH SURGEON ACCESS;  Surgeon: Andrez Banker, MD;  Location: [facility] ORS;  Service: Urology;  Laterality: Left;   TEE WITHOUT CARDIOVERSION N/A 04/02/2016   Procedure: TRANSESOPHAGEAL ECHOCARDIOGRAM (TEE);  Surgeon: Norita Beauvais, MD;  Location: [facility] OR;  Service: Open Heart Surgery;  Laterality: N/A;      Current Outpatient Medications  Medication Sig Dispense Refill   aspirin  EC 81 MG tablet Take 81 mg by mouth daily.     atorvastatin  (LIPITOR ) 80 MG tablet Take 1 tablet (80 mg total) by mouth daily. PLEASE CALL [phone] TO SCHEDULE A FOLLOW VISIT. 90 tablet 1   budesonide -formoterol  (SYMBICORT ) 160-4.5 MCG/ACT inhaler Inhale 2 puffs into the lungs 2 (two) times daily. 30.6  each 0   Cholecalciferol (VITAMIN D3) 25 MCG (1000 UT) CAPS Take 2,000 Units by mouth daily.     felodipine  (PLENDIL ) 10 MG 24 hr tablet TAKE 1 TABLET BY MOUTH EVERY DAY 90 tablet 0   hydrALAZINE  (APRESOLINE ) 25 MG tablet Take 1 tablet (25 mg total) by mouth 2 (two) times daily. 180 tablet 3   metFORMIN  (GLUCOPHAGE -XR) 500 MG 24 hr tablet TAKE 1 TABLET EVERY DAY 90 tablet 3   metoprolol   succinate (TOPROL -XL) 25 MG 24 hr tablet TAKE 1 TABLET (25 MG TOTAL) BY MOUTH DAILY. 90 tablet 3   sertraline  (ZOLOFT ) 50 MG tablet TAKE 2 TABLETS BY MOUTH EVERY DAY 180 tablet 1   tirzepatide  (MOUNJARO ) 7.5 MG/0.5ML Pen Inject 7.5 mg into the skin once a week. 2 mL 1   TRUE METRIX BLOOD GLUCOSE TEST test strip TEST BLOOD SUGAR AS DIRECTED 300 strip 0   Alcohol Swabs (DROPSAFE ALCOHOL PREP) 70 % PADS USE AS DIRECTED 300 each 0   celecoxib  (CELEBREX ) 200 MG capsule TAKE 1 CAPSULE TWICE DAILY 180 capsule 3   hydrochlorothiazide  (HYDRODIURIL ) 25 MG tablet Take 1 tablet (25 mg total) by mouth daily. (Patient not taking: Reported on 11/25/2023) 90 tablet 2   Current Facility-Administered Medications  Medication Dose Route Frequency Provider Last Rate Last Admin   0.9 %  sodium chloride  infusion  500 mL Intravenous Continuous Kenney Peacemaker, MD        Allergies as of 11/25/2023 - Review Complete 11/25/2023  Allergen Reaction Noted   Other Hives 04/17/2022   Yellow jacket venom Anaphylaxis 04/17/2022   Allopurinol  Other (See Comments) 12/04/2017   Justicia adhatoda (malabar nut tree) [justicia adhatoda] Hives 07/25/2020   Valsartan -hydrochlorothiazide  Other (See Comments) 12/04/2017   Sulfa antibiotics Hives 11/28/2010    Family History  Problem Relation Age of Onset   Stroke Mother    Diabetes Mother    Hypertension Mother    Hyperlipidemia Mother    Obesity Mother    Colon cancer Father 68   Colon polyps Father    Cancer Father    Alcohol abuse Father    Heart disease Maternal Aunt    Diabetes Maternal Uncle    Stomach cancer Neg Hx    Esophageal cancer Neg Hx    Rectal cancer Neg Hx     Social History   Socioeconomic History   Marital status: Married    Spouse name: Cornelius Dill   Number of children: Y   Years of education: Not on file   Highest education level: Not on file  Occupational History   Not on file  Tobacco Use   Smoking status: Former    Current packs/day: 0.00     Average packs/day: 1 pack/day for 47.0 years (47.0 ttl pk-yrs)    Types: Cigarettes    Start date: 06/26/1971    Quit date: 06/25/2018    Years since quitting: 5.4    Passive exposure: Past   Smokeless tobacco: Never   Tobacco comments:    Started smoking at age 37 smoked pack a day on average   Vaping Use   Vaping status: Never Used  Substance and Sexual Activity   Alcohol use: Not Currently    Comment: occaional shot of vodka   Drug use: Yes    Frequency: 7.0 times per week    Types: Marijuana   Sexual activity: Not on file  Other Topics Concern   Not on file  Social History Narrative   Not on file  Social Drivers of Corporate investment banker Strain: Low Risk  (08/13/2022)   Overall Financial Resource Strain (CARDIA)    Difficulty of Paying Living Expenses: Not hard at all  Food Insecurity: No Food Insecurity (08/13/2022)   Hunger Vital Sign    Worried About Running Out of Food in the Last Year: Never true    Ran Out of Food in the Last Year: Never true  Transportation Needs: No Transportation Needs (08/13/2022)   PRAPARE - Administrator, Civil Service (Medical): No    Lack of Transportation (Non-Medical): No  Physical Activity: Insufficiently Active (08/13/2022)   Exercise Vital Sign    Days of Exercise per Week: 2 days    Minutes of Exercise per Session: 20 min  Stress: No Stress Concern Present (08/13/2022)   [employer] of Occupational Health - Occupational Stress Questionnaire    Feeling of Stress : Not at all  Social Connections: Moderately Isolated (08/13/2022)   Social Connection and Isolation Panel [NHANES]    Frequency of Communication with Friends and Family: More than three times a week    Frequency of Social Gatherings with Friends and Family: More than three times a week    Attends Religious Services: Never    Database administrator or Organizations: No    Attends Banker Meetings: Never    Marital Status: Married   Catering manager Violence: Not At Risk (08/13/2022)   Humiliation, Afraid, Rape, and Kick questionnaire    Fear of Current or Ex-Partner: No    Emotionally Abused: No    Physically Abused: No    Sexually Abused: No    Review of Systems:  All other review of systems negative except as mentioned in the HPI.  Physical Exam: Vital signs BP (!) 146/72   Pulse (!) 58   Temp 97.9 F (36.6 C)   Ht 5\' 8"  (1.727 m)   Wt 259 lb (117.5 kg)   SpO2 95%   BMI 39.38 kg/m   General:   Alert,  Well-developed, well-nourished, pleasant and cooperative in NAD Lungs:  Clear throughout to auscultation.   Heart:  Regular rate and rhythm; no murmurs, clicks, rubs,  or gallops. Abdomen:  Soft, nontender and nondistended. Normal bowel sounds.   Neuro/Psych:  Alert and cooperative. Normal mood and affect. A and O x 3   @Liany Mumpower  Tammie Fall, MD, [facility] [phone] (pager) 11/25/2023 9:49 AM@

## 2023-11-25 ENCOUNTER — Encounter: Payer: Self-pay | Admitting: Internal Medicine

## 2023-11-25 ENCOUNTER — Ambulatory Visit: Admitting: Internal Medicine

## 2023-11-25 VITALS — BP 149/69 | HR 66 | Temp 97.9°F | Resp 18 | Ht 68.0 in | Wt 259.0 lb

## 2023-11-25 DIAGNOSIS — D125 Benign neoplasm of sigmoid colon: Secondary | ICD-10-CM | POA: Diagnosis not present

## 2023-11-25 DIAGNOSIS — K5140 Inflammatory polyps of colon without complications: Secondary | ICD-10-CM | POA: Diagnosis not present

## 2023-11-25 DIAGNOSIS — Z800 Family history of malignant neoplasm of digestive organs: Secondary | ICD-10-CM

## 2023-11-25 DIAGNOSIS — K648 Other hemorrhoids: Secondary | ICD-10-CM | POA: Diagnosis not present

## 2023-11-25 DIAGNOSIS — K5730 Diverticulosis of large intestine without perforation or abscess without bleeding: Secondary | ICD-10-CM | POA: Diagnosis not present

## 2023-11-25 DIAGNOSIS — D123 Benign neoplasm of transverse colon: Secondary | ICD-10-CM

## 2023-11-25 DIAGNOSIS — Z860101 Personal history of adenomatous and serrated colon polyps: Secondary | ICD-10-CM | POA: Diagnosis not present

## 2023-11-25 DIAGNOSIS — D122 Benign neoplasm of ascending colon: Secondary | ICD-10-CM | POA: Diagnosis not present

## 2023-11-25 DIAGNOSIS — E119 Type 2 diabetes mellitus without complications: Secondary | ICD-10-CM | POA: Diagnosis not present

## 2023-11-25 DIAGNOSIS — Z860100 Personal history of colon polyps, unspecified: Secondary | ICD-10-CM

## 2023-11-25 DIAGNOSIS — Z1211 Encounter for screening for malignant neoplasm of colon: Secondary | ICD-10-CM | POA: Diagnosis not present

## 2023-11-25 DIAGNOSIS — K635 Polyp of colon: Secondary | ICD-10-CM | POA: Diagnosis not present

## 2023-11-25 DIAGNOSIS — D120 Benign neoplasm of cecum: Secondary | ICD-10-CM

## 2023-11-25 DIAGNOSIS — I10 Essential (primary) hypertension: Secondary | ICD-10-CM | POA: Diagnosis not present

## 2023-11-25 MED ORDER — SODIUM CHLORIDE 0.9 % IV SOLN: 500.0000 mL | INTRAVENOUS | Status: DC

## 2023-11-25 NOTE — Op Note (Signed)
 [facility] Patient Name: David Brandt Procedure Date: 11/25/2023 9:51 AM MRN: [id number] Endoscopist: Kenney Peacemaker , MD, [id number] Age: 78 Referring MD:  Date of Birth: 1945/08/30 Gender: Male Account #: [id number] Procedure:                Colonoscopy Indications:              High risk colon cancer surveillance: Personal                            history of colonic polyps, Last colonoscopy: 2019 Medicines:                Monitored Anesthesia Care Procedure:                Pre-Anesthesia Assessment:                           - Prior to the procedure, a History and Physical                            was performed, and patient medications and                            allergies were reviewed. [name] tolerance of                            previous anesthesia was also reviewed. The risks                            and benefits of the procedure and the sedation                            options and risks were discussed with the patient.                            All questions were answered, and informed consent                            was obtained. Prior Anticoagulants: The patient has                            taken no anticoagulant or antiplatelet agents. ASA                            Grade Assessment: III - A patient with severe                            systemic disease. After reviewing the risks and                            benefits, the patient was deemed in satisfactory                            condition to undergo the procedure.  After obtaining informed consent, the colonoscope                            was passed under direct vision. Throughout the                            procedure, the patient's blood pressure, pulse, and                            oxygen  saturations were monitored continuously. The                            Olympus Scope SN: [id number] was introduced through                            the anus  and advanced to the the cecum, identified                            by appendiceal orifice and ileocecal valve. The                            colonoscopy was performed without difficulty. The                            patient tolerated the procedure well. The quality                            of the bowel preparation was adequate. The bowel                            preparation used was SUPREP via split dose                            instruction. The ileocecal valve, appendiceal                            orifice, and rectum were photographed. Scope In: 10:03:11 AM Scope Out: 10:34:14 AM Scope Withdrawal Time: 0 hours 24 minutes 51 seconds  Total Procedure Duration: 0 hours 31 minutes 3 seconds  Findings:                 The perianal and digital rectal examinations were                            normal.                           Seven sessile polyps were found in the sigmoid                            colon, transverse colon, ascending colon and                            ileocecal valve. The polyps were 1 to 7 mm in size.  These polyps were removed with a cold snare.                            Resection and retrieval were complete. Verification                            of patient identification for the specimen was                            done. Estimated blood loss was minimal.                           Multiple diverticula were found in the sigmoid                            colon and descending colon.                           Internal hemorrhoids were found.                           The exam was otherwise without abnormality on                            direct and retroflexion views. Complications:            No immediate complications. Estimated Blood Loss:     Estimated blood loss was minimal. Impression:               - Seven 1 to 7 mm polyps in the sigmoid colon, in                            the transverse colon, in the ascending colon and  at                            the ileocecal valve, removed with a cold snare.                            Resected and retrieved.                           - Diverticulosis in the sigmoid colon and in the                            descending colon.                           - Internal hemorrhoids.                           - The examination was otherwise normal on direct                            and retroflexion views.                           -  Personal history of colonic polyps. and family hx                            CRCA father (age 65)                           2013 Multiple adenomas (5+) largest 8-10 mm                           2016 1 adenoma and 2-3 hyperplastic polyps max                            polyp size 6 mm (not the adenoma)                           02/20/2018 no polyps Recommendation:           - Patient has a contact number available for                            emergencies. The signs and symptoms of potential                            delayed complications were discussed with the                            patient. Return to normal activities tomorrow.                            Written discharge instructions were provided to the                            patient.                           - Resume previous diet.                           - Continue present medications.                           - Await pathology results.                           - No recommendation at this time regarding repeat                            colonoscopy due to age and co-morbidities -                            unlikely to recommed routine repeat. Kenney Peacemaker, MD 11/25/2023 10:43:18 AM This report has been signed electronically.

## 2023-11-25 NOTE — Patient Instructions (Addendum)
 David Brandt small polyps removed today.  You also have a condition called diverticulosis - common and not usually a problem. Please read the handout provided. Hemorrhoids also seen today.  I will let you know pathology results by mail and/or My Chart.   I appreciate the opportunity to care for you. Kenney Peacemaker, MD, FACG  YOU HAD AN ENDOSCOPIC PROCEDURE TODAY AT THE [facility]:   Refer to the procedure report that was given to you for any specific questions about what was found during the examination.  If the procedure report does not answer your questions, please call your gastroenterologist to clarify.  If you requested that your care partner not be given the details of your procedure findings, then the procedure report has been included in a sealed envelope for you to review at your convenience later.  YOU SHOULD EXPECT: Some feelings of bloating in the abdomen. Passage of more gas than usual.  Walking can help get rid of the air that was put into your GI tract during the procedure and reduce the bloating. If you had a lower endoscopy (such as a colonoscopy or flexible sigmoidoscopy) you may notice spotting of blood in your stool or on the toilet paper. If you underwent a bowel prep for your procedure, you may not have a normal bowel movement for a few days.  Please Note:  You might notice some irritation and congestion in your nose or some drainage.  This is from the oxygen  used during your procedure.  There is no need for concern and it should clear up in a day or so.  SYMPTOMS TO REPORT IMMEDIATELY:  Following lower endoscopy (colonoscopy or flexible sigmoidoscopy):  Excessive amounts of blood in the stool  Significant tenderness or worsening of abdominal pains  Swelling of the abdomen that is new, acute  Fever of 100F or higher  Following upper endoscopy (EGD)  Vomiting of blood or coffee ground material  New chest pain or pain under the shoulder blades  Painful or  persistently difficult swallowing  New shortness of breath  Fever of 100F or higher  Black, tarry-looking stools  For urgent or emergent issues, a gastroenterologist can be reached at any hour by calling [phone]. Do not use MyChart messaging for urgent concerns.    DIET:  We do recommend a small meal at first, but then you may proceed to your regular diet.  Drink plenty of fluids but you should avoid alcoholic beverages for 24 hours.  ACTIVITY:  You should plan to take it easy for the rest of today and you should NOT DRIVE or use heavy machinery until tomorrow (because of the sedation medicines used during the test).    FOLLOW UP: Our staff will call the number listed on your records the next business day following your procedure.  We will call around 7:15- 8:00 am to check on you and address any questions or concerns that you may have regarding the information given to you following your procedure. If we do not reach you, we will leave a message.     If any biopsies were taken you will be contacted by phone or by letter within the next 1-3 weeks.  Please call us  at [phone] if you have not heard about the biopsies in 3 weeks.    SIGNATURES/CONFIDENTIALITY: You and/or your care partner have signed paperwork which will be entered into your electronic medical record.  These signatures attest to the fact that that the information above  on your After Visit Summary has been reviewed and is understood.  Full responsibility of the confidentiality of this discharge information lies with you and/or your care-partner.

## 2023-11-25 NOTE — Progress Notes (Signed)
 Pt's states no medical or surgical changes since previsit or office visit.

## 2023-11-25 NOTE — Progress Notes (Signed)
 Sedate, gd SR, tolerated procedure well, VSS, report to RN

## 2023-11-26 ENCOUNTER — Telehealth: Payer: Self-pay

## 2023-11-26 ENCOUNTER — Other Ambulatory Visit: Payer: Self-pay | Admitting: Cardiovascular Disease

## 2023-11-26 DIAGNOSIS — E1169 Type 2 diabetes mellitus with other specified complication: Secondary | ICD-10-CM

## 2023-11-26 NOTE — Telephone Encounter (Signed)
 No answer after follow up call. Voice message left.

## 2023-11-29 LAB — SURGICAL PATHOLOGY

## 2023-12-02 DIAGNOSIS — R351 Nocturia: Secondary | ICD-10-CM | POA: Diagnosis not present

## 2023-12-02 DIAGNOSIS — N200 Calculus of kidney: Secondary | ICD-10-CM | POA: Diagnosis not present

## 2023-12-02 DIAGNOSIS — N5201 Erectile dysfunction due to arterial insufficiency: Secondary | ICD-10-CM | POA: Diagnosis not present

## 2023-12-05 ENCOUNTER — Other Ambulatory Visit: Payer: Self-pay | Admitting: Cardiovascular Disease

## 2023-12-05 ENCOUNTER — Ambulatory Visit: Payer: Self-pay | Admitting: Internal Medicine

## 2023-12-05 DIAGNOSIS — Z860101 Personal history of adenomatous and serrated colon polyps: Secondary | ICD-10-CM

## 2023-12-13 ENCOUNTER — Emergency Department (HOSPITAL_COMMUNITY)
Admission: EM | Admit: 2023-12-13 | Discharge: 2023-12-13 | Disposition: A | Discharge status: Home / Self Care | Attending: Emergency Medicine | Admitting: Emergency Medicine

## 2023-12-13 ENCOUNTER — Ambulatory Visit: Payer: Self-pay

## 2023-12-13 ENCOUNTER — Emergency Department (HOSPITAL_COMMUNITY)

## 2023-12-13 ENCOUNTER — Encounter (HOSPITAL_COMMUNITY): Payer: Self-pay

## 2023-12-13 ENCOUNTER — Other Ambulatory Visit: Payer: Self-pay

## 2023-12-13 DIAGNOSIS — K5900 Constipation, unspecified: Secondary | ICD-10-CM

## 2023-12-13 DIAGNOSIS — Z7984 Long term (current) use of oral hypoglycemic drugs: Secondary | ICD-10-CM | POA: Diagnosis not present

## 2023-12-13 DIAGNOSIS — I10 Essential (primary) hypertension: Secondary | ICD-10-CM | POA: Insufficient documentation

## 2023-12-13 DIAGNOSIS — R0600 Dyspnea, unspecified: Secondary | ICD-10-CM | POA: Diagnosis not present

## 2023-12-13 DIAGNOSIS — Z7982 Long term (current) use of aspirin: Secondary | ICD-10-CM | POA: Insufficient documentation

## 2023-12-13 DIAGNOSIS — J449 Chronic obstructive pulmonary disease, unspecified: Secondary | ICD-10-CM | POA: Insufficient documentation

## 2023-12-13 DIAGNOSIS — Z87891 Personal history of nicotine dependence: Secondary | ICD-10-CM | POA: Diagnosis not present

## 2023-12-13 DIAGNOSIS — J45909 Unspecified asthma, uncomplicated: Secondary | ICD-10-CM | POA: Diagnosis not present

## 2023-12-13 DIAGNOSIS — R42 Dizziness and giddiness: Secondary | ICD-10-CM | POA: Diagnosis present

## 2023-12-13 DIAGNOSIS — E119 Type 2 diabetes mellitus without complications: Secondary | ICD-10-CM | POA: Insufficient documentation

## 2023-12-13 DIAGNOSIS — E860 Dehydration: Secondary | ICD-10-CM | POA: Diagnosis not present

## 2023-12-13 DIAGNOSIS — R059 Cough, unspecified: Secondary | ICD-10-CM | POA: Diagnosis not present

## 2023-12-13 LAB — CBC
HCT: 52.8 % — ABNORMAL HIGH (ref 39.0–52.0)
Hemoglobin: 17.2 g/dL — ABNORMAL HIGH (ref 13.0–17.0)
MCH: 29.9 pg (ref 26.0–34.0)
MCHC: 32.6 g/dL (ref 30.0–36.0)
MCV: 91.8 fL (ref 80.0–100.0)
Platelets: 231 10*3/uL (ref 150–400)
RBC: 5.75 MIL/uL (ref 4.22–5.81)
RDW: 14.1 % (ref 11.5–15.5)
WBC: 9.8 10*3/uL (ref 4.0–10.5)
nRBC: 0 % (ref 0.0–0.2)

## 2023-12-13 LAB — BASIC METABOLIC PANEL WITH GFR
Anion gap: 14 (ref 5–15)
BUN: 21 mg/dL (ref 8–23)
CO2: 24 mmol/L (ref 22–32)
Calcium: 9.3 mg/dL (ref 8.9–10.3)
Chloride: 97 mmol/L — ABNORMAL LOW (ref 98–111)
Creatinine, Ser: 1.54 mg/dL — ABNORMAL HIGH (ref 0.61–1.24)
GFR, Estimated: 46 mL/min — ABNORMAL LOW (ref >60)
Glucose, Bld: 102 mg/dL — ABNORMAL HIGH (ref 70–99)
Potassium: 4 mmol/L (ref 3.5–5.1)
Sodium: 135 mmol/L (ref 135–145)

## 2023-12-13 LAB — TROPONIN I (HIGH SENSITIVITY): Troponin I (High Sensitivity): 9 ng/L (ref <18)

## 2023-12-13 LAB — CBG MONITORING, ED: Glucose-Capillary: 107 mg/dL — ABNORMAL HIGH (ref 70–99)

## 2023-12-13 MED ORDER — LACTATED RINGERS IV BOLUS
1000.0000 mL | Freq: Once | INTRAVENOUS | Status: AC
  Administered 2023-12-13: 1000 mL via INTRAVENOUS

## 2023-12-13 MED ORDER — SENNA 8.6 MG PO TABS: 1.0000 | ORAL_TABLET | Freq: Every day | ORAL | 0 refills | Status: AC

## 2023-12-13 MED ORDER — POLYETHYLENE GLYCOL 3350 17 G PO PACK
17.0000 g | PACK | Freq: Every day | ORAL | Status: DC
  Administered 2023-12-13: 17 g via ORAL
  Filled 2023-12-13: qty 1

## 2023-12-13 MED ORDER — IPRATROPIUM-ALBUTEROL 0.5-2.5 (3) MG/3ML IN SOLN
3.0000 mL | Freq: Once | RESPIRATORY_TRACT | Status: AC
  Administered 2023-12-13: 3 mL via RESPIRATORY_TRACT
  Filled 2023-12-13: qty 3

## 2023-12-13 MED ORDER — DOCUSATE SODIUM 100 MG PO CAPS: 100.0000 mg | ORAL_CAPSULE | Freq: Two times a day (BID) | ORAL | 0 refills | Status: AC

## 2023-12-13 NOTE — Telephone Encounter (Signed)
 FYI Only or Action Required?: FYI only for provider.  Patient was last seen in primary care on 10/03/2023 by Maurie Southern, DO. Called Nurse Triage reporting Blurred Vision. Symptoms began 1.5 weeks ago. Interventions attempted: Rest, hydration, or home remedies. Symptoms are: gradually worsening.  Triage Disposition: Go to ED or PCP/Alternative with Approval  Patient/caregiver understands and will follow disposition?: yes     Copied from CRM [id number]. Topic: Clinical - Red Word Triage >> Dec 13, 2023  1:32 PM [name] wrote: [facility] that prompted transfer to Nurse Triage: Patient called stated he has been constipated, blurred vision, and have been having sweats, have been having to lay down just to stop from sweating Reason for Disposition  [1] Blurred vision or visual changes AND [2] present now AND [3] sudden onset or new (e.g., minutes, hours, days)  (Exception: Seeing floaters / black specks OR previously diagnosed migraine headaches with same symptoms.)  Answer Assessment - Initial Assessment Questions 1. DESCRIPTION: How has your vision changed? (e.g., complete vision loss, blurred vision, double vision, floaters, etc.)     Blurred vision-- far vision blurry  2. LOCATION: One or both eyes? If one, ask: Which eye?     Both  3. SEVERITY: Can you see anything? If Yes, ask: What can you see? (e.g., fine print)     Cannot see off in distance 4. ONSET: When did this begin? Did it start suddenly or has this been gradual?     1.5 week 5. PATTERN: Does this come and go, or has it been constant since it started?     Constant worsening 6. PAIN: Is there any pain in your eye(s)?  (Scale 1-10; or mild, moderate, severe)   - NONE (0): No pain.   - MILD (1-3): Doesn't interfere with normal activities.   - MODERATE (4-7): Interferes with normal activities or awakens from sleep.    - SEVERE (8-10): Excruciating pain, unable to do any normal activities.     no 7.  CONTACTS-GLASSES: Do you wear contacts or glasses?     glasses 8. CAUSE: What do you think is causing this visual problem?     Stroke  9. OTHER SYMPTOMS: Do you have any other symptoms? (e.g., confusion, headache, arm or leg weakness, speech problems)     Sweating feeling bad weakness, constipation this am last BM 10. PREGNANCY: Is there any chance you are pregnant? When was your last menstrual period?       N/a  Protocols used: Vision Loss or Change-A-AH

## 2023-12-13 NOTE — Discharge Instructions (Addendum)
 While you were in the emergency room, we did test to look for signs of injury to your heart.  These test were normal.  Your chest x-ray was normal.  You received some breathing treatments here in the ED for your COPD.  Make sure that you you are using your inhalers at home.  I have sent your prescription for Colace and senna.  These are medications to help with your constipation.  Drink plenty of water over the next several days.  Please follow-up with your PCP within the next 1 week.

## 2023-12-13 NOTE — ED Triage Notes (Signed)
 Pt came in for SOB, constipation, and blurred vision for about three days.

## 2023-12-13 NOTE — ED Provider Notes (Signed)
 [place] EMERGENCY DEPARTMENT AT [facility] Provider Note  CSN: [id number] Arrival date & time: 12/13/23 1438  Chief Complaint(s) Dizziness and Constipation  HPI David Brandt is a 78 y.o. male this is a 78 year old male who presents emergency room today due to shortness of breath, feelings of lightheadedness, constipation, as well as intermittent blurry vision over the last several days.  Patient reports that for the last week, he is felt a bit constipated.  He has a history of COPD, is felt short of breath.  Patient states he has not been eating or drinking as much as usually does.   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 Authorizing Provider  docusate sodium  (COLACE) 100 MG capsule Take 1 capsule (100 mg  total) by mouth every 12 (twelve) hours. 12/13/23  Yes Nathanael Baker, DO  senna (SENOKOT) 8.6 MG TABS tablet Take 1 tablet (8.6 mg total) by mouth daily for 14 days. 12/13/23 12/27/23 Yes Afton Horse T, DO  Alcohol Swabs (DROPSAFE ALCOHOL PREP) 70 % PADS USE AS DIRECTED 05/10/21   Donley Furth, MD  aspirin  EC 81 MG tablet Take 81 mg by mouth daily.    [provider]  atorvastatin  (LIPITOR ) 80 MG tablet TAKE 1 TABLET EVERY DAY. PLEASE CALL [phone] TO SCHEDULE A FOLLOW UP VISIT. 12/06/23   Maudine Sos, MD  budesonide -formoterol  (SYMBICORT ) 160-4.5 MCG/ACT inhaler Inhale 2 puffs into the lungs 2 (two) times daily. 07/11/23   Donley Furth, MD  celecoxib  (CELEBREX ) 200 MG capsule TAKE 1 CAPSULE TWICE DAILY 08/01/23   Donley Furth, MD  Cholecalciferol (VITAMIN D3) 25 MCG (1000 UT) CAPS Take 2,000 Units by mouth daily.    [provider]  felodipine  (PLENDIL ) 10 MG 24 hr tablet TAKE 1 TABLET BY MOUTH EVERY DAY 10/29/23   Maudine Sos, MD  hydrALAZINE  (APRESOLINE ) 25 MG tablet Take 1 tablet (25 mg total) by mouth 2 (two) times daily. 10/30/23   Maudine Sos, MD  hydrochlorothiazide  (HYDRODIURIL ) 25 MG tablet Take 1 tablet (25 mg total) by mouth daily. Patient not taking: Reported on 11/25/2023 10/28/23   Maudine Sos, MD  metFORMIN  (GLUCOPHAGE -XR) 500 MG 24 hr tablet TAKE 1 TABLET EVERY DAY  03/11/23   Donley Furth, MD  metoprolol  succinate (TOPROL -XL) 25 MG 24 hr tablet TAKE 1 TABLET (25 MG TOTAL) BY MOUTH DAILY. 08/02/23   Maudine Sos, MD  sertraline  (ZOLOFT ) 50 MG tablet TAKE 2 TABLETS BY MOUTH EVERY DAY 10/30/23   Donley Furth, MD  tirzepatide  (MOUNJARO ) 7.5 MG/0.5ML Pen INJECT 7.5 MG SUBCUTANEOUSLY WEEKLY 11/27/23   Maudine Sos, MD  TRUE METRIX BLOOD GLUCOSE TEST test strip TEST BLOOD SUGAR AS DIRECTED 07/11/21   Donley Furth, MD                                                                                                                                     Past Surgical History Past Surgical History:  Procedure Laterality Date   CARDIAC CATHETERIZATION N/A 03/29/2016   Procedure: Right/Left Heart Cath and Coronary Angiography;  Surgeon: Sammy Crisp, MD;  Location: [facility] INVASIVE CV LAB;  Service: Cardiovascular;  Laterality: N/A;   CARDIAC CATHETERIZATION N/A 03/29/2016   Procedure: Intravascular Pressure Wire/FFR Study;  Surgeon: Sammy Crisp, MD;  Location: [facility] INVASIVE CV LAB;  Service: Cardiovascular;  Laterality: N/A;   CARDIAC CATHETERIZATION Left 04/02/2016   Procedure: FEMORAL ARTERIAL LINE INSERTION;  Surgeon: Norita Beauvais, MD;  Location: [facility] OR;  Service: Open Heart Surgery;  Laterality: Left;   COLONOSCOPY  02/20/2018   per Dr. Willy Harvest, no polyps, repeat in 5 yrs (previous adenomatous polyps)    CORONARY ARTERY BYPASS GRAFT N/A 04/02/2016   Procedure: CORONARY ARTERY BYPASS GRAFTING (CABG) X 5 UTILIZING LEFT INTERNAL MAMMARY ARTERY AND ENDOSCOPICALLY HARVESTED GREATER SAPHENOUS VEIN ; Mammary to LAD, Sequencial 1st to 2nd Diagonal, Vein to OM, Vein to Distal RCA.;  Surgeon: Norita Beauvais, MD;  Location: [facility] OR;  Service: Open Heart Surgery;  Laterality: N/A;   CYSTOSCOPY/URETEROSCOPY/HOLMIUM LASER/STENT PLACEMENT Bilateral 05/07/2019   Procedure: LEFT URETEROSCOPY/HOLMIUM LASER/STENT PLACEMENT/BIOPSY LEFT MID URETER/RIGHT RETROGRADE STENT PLACEMENT ;  Surgeon: Andrez Banker, MD;  Location: [facility] ORS;  Service: Urology;  Laterality: Bilateral;   CYSTOSCOPY/URETEROSCOPY/HOLMIUM LASER/STENT PLACEMENT Bilateral 05/27/2019   Procedure: CYSTOSCOPY, URETEROSCOPY/ RETROGRADE PYELOGRAM/ HOLMIUM LASER/STENT EXCHANGE;  Surgeon: Andrez Banker, MD;  Location: [facility] ORS;  Service: Urology;  Laterality: Bilateral;   LYMPH NODE BIOPSY Left 04/02/2016   Procedure: INCIDENTAL LEFT MAMMARY LYMPH NODE BIOPSY;  Surgeon: Norita Beauvais, MD;  Location: [facility] OR;  Service: Open Heart Surgery;  Laterality: Left;   mass abdomen  1955    NEPHROLITHOTOMY Left 04/24/2019   Procedure: NEPHROLITHOTOMY PERCUTANEOUS WITH SURGEON ACCESS;  Surgeon: Andrez Banker, MD;  Location: [facility] ORS;  Service: Urology;  Laterality: Left;   TEE WITHOUT CARDIOVERSION N/A 04/02/2016   Procedure: TRANSESOPHAGEAL ECHOCARDIOGRAM (TEE);  Surgeon: Norita Beauvais, MD;  Location: [facility] OR;  Service: Open Heart Surgery;  Laterality: N/A;   Family History Family History  Problem Relation Age of Onset   Stroke Mother    Diabetes Mother  Hypertension Mother    Hyperlipidemia Mother    Obesity Mother    Colon cancer Father 80   Colon polyps Father    Cancer Father    Alcohol abuse Father    Heart disease Maternal Aunt    Diabetes Maternal Uncle    Stomach cancer Neg Hx    Esophageal cancer Neg Hx    Rectal cancer Neg Hx     Social History Social History   Tobacco Use   Smoking status: Former    Current packs/day: 0.00    Average packs/day: 1 pack/day for 47.0 years (47.0 ttl pk-yrs)    Types: Cigarettes    Start date: 06/26/1971    Quit date: 06/25/2018    Years since quitting: 5.4    Passive exposure: Past   Smokeless tobacco: Never   Tobacco comments:    Started smoking at age 9 smoked pack a day on average   Vaping Use   Vaping status: Never Used  Substance Use Topics   Alcohol use: Not Currently    Comment: occaional shot of vodka   Drug use: Yes    Frequency: 7.0 times per week    Types: Marijuana   Allergies Other, Yellow jacket venom, Allopurinol , Justicia adhatoda (malabar nut tree) [justicia adhatoda], Valsartan -hydrochlorothiazide , and Sulfa antibiotics  Review of Systems Review of Systems  Physical Exam Vital Signs  I have reviewed the triage vital signs BP (!) 146/84 (BP Location: Left Arm)   Pulse 79   Temp 98.5 F (36.9 C) (Oral)   Resp 18   SpO2 100%   Physical Exam Vitals and nursing note reviewed.  HENT:     Head: Normocephalic.     Nose: Nose normal.   Eyes:     Extraocular Movements:  Extraocular movements intact.     Pupils: Pupils are equal, round, and reactive to light.    Cardiovascular:     Rate and Rhythm: Normal rate.  Pulmonary:     Effort: Pulmonary effort is normal.     Breath sounds: Wheezing present.  Abdominal:     General: Abdomen is flat. There is no distension.     Palpations: Abdomen is soft.     Tenderness: There is no abdominal tenderness. There is no guarding.   Musculoskeletal:        General: Normal range of motion.     Cervical back: Normal range of motion.   Neurological:     General: No focal deficit present.     Mental Status: Mental status is at baseline.     Cranial Nerves: No cranial nerve deficit.     Motor: No weakness.     Gait: Gait normal.     ED Results and Treatments Labs (all labs ordered are listed, but only abnormal results are displayed) Labs Reviewed  BASIC METABOLIC PANEL WITH GFR - Abnormal; Notable for the following components:      Result Value   Chloride 97 (*)    Glucose, Bld 102 (*)    Creatinine, Ser 1.54 (*)    GFR, Estimated 46 (*)    All other components within normal limits  CBC - Abnormal; Notable for the following components:   Hemoglobin 17.2 (*)    HCT 52.8 (*)    All other components within normal limits  CBG MONITORING, ED - Abnormal; Notable for the following components:   Glucose-Capillary 107 (*)    All other components within normal limits  TROPONIN I (HIGH SENSITIVITY)  TROPONIN I (HIGH  SENSITIVITY)                                                                                                                          Radiology DG Chest 1 View Result Date: 12/13/2023 CLINICAL DATA:  Cough, dyspnea EXAM: CHEST  1 VIEW COMPARISON:  None Available. FINDINGS: Lungs are well expanded, symmetric, and clear. No pneumothorax or pleural effusion. Cardiac size within normal limits. Median sternotomy has been performed. Pulmonary vascularity is normal. Osseous structures are age-appropriate. No  acute bone abnormality. IMPRESSION: No active disease. Electronically Signed   By: Worthy Heads M.D.   On: 12/13/2023 20:15    Pertinent labs & imaging results that were available during my care of the patient were reviewed by me and considered in my medical decision making (see MDM for details).  Medications Ordered in ED Medications  polyethylene glycol (MIRALAX / GLYCOLAX) packet 17 g (17 g Oral Given 12/13/23 1929)  lactated ringers  bolus 1,000 mL (0 mLs Intravenous Stopped 12/13/23 2023)  lactated ringers  bolus 1,000 mL (1,000 mLs Intravenous New Bag/Given 12/13/23 1929)  ipratropium-albuterol  (DUONEB) 0.5-2.5 (3) MG/3ML nebulizer solution 3 mL (3 mLs Nebulization Given 12/13/23 1929)                                                                                                                                     Procedures Procedures  (including critical care time)  Medical Decision Making / ED Course   This patient presents to the ED for concern of multiple complaints, this involves an extensive number of treatment options, and is a complaint that carries with it a high risk of complications and morbidity.  The differential diagnosis includes COPD, constipation, dehydration, less likely CVA, orthostatic hypotension.  MDM: On exam, patient overall well-appearing.  His normal vital signs.  Has no neurological deficits on my exam.  Patient scribes symptoms of feeling lightheaded when he stands, when that happens he has some visual changes.  Sounds more like an orthostatic hypotension.  Looking at the patient's labs were drawn at triage, does have hemoconcentrated blood.  This likely is contributing to the constipation symptoms he is been experiencing.  Has had a bowel movement over the last couple of days but they have been less frequent.  Do not believe patient requires CT imaging given his overall reassuring exam.  Believe symptomatic treatment seems the patient improves his more  appropriate.  Patient with a negative troponin.  He received a DuoNeb with significant improvement in his breathing.  Will discharge on the patient and have him follow-up with his PCP.   Additional history obtained: -Additional history obtained from son at bedside -External records from outside source obtained and reviewed including: Chart review including previous notes, labs, imaging, consultation notes   Lab Tests: -I ordered, reviewed, and interpreted labs.   The pertinent results include:   Labs Reviewed  BASIC METABOLIC PANEL WITH GFR - Abnormal; Notable for the following components:      Result Value   Chloride 97 (*)    Glucose, Bld 102 (*)    Creatinine, Ser 1.54 (*)    GFR, Estimated 46 (*)    All other components within normal limits  CBC - Abnormal; Notable for the following components:   Hemoglobin 17.2 (*)    HCT 52.8 (*)    All other components within normal limits  CBG MONITORING, ED - Abnormal; Notable for the following components:   Glucose-Capillary 107 (*)    All other components within normal limits  TROPONIN I (HIGH SENSITIVITY)  TROPONIN I (HIGH SENSITIVITY)      EKG my independent review of the patient's EKG shows no ST segment depressions or elevations, no T wave inversions, no evidence of acute ischemia.  EKG Interpretation Date/Time:  Friday December 13 2023 14:47:02 EDT Ventricular Rate:  102 PR Interval:  145 QRS Duration:  70 QT Interval:  343 QTC Calculation: 447 R Axis:   -32  Text Interpretation: Sinus tachycardia Atrial premature complex Left axis deviation Confirmed by Afton Horse [id number]) on 12/13/2023 8:22:22 PM         Imaging Studies ordered: I ordered imaging studies including chest x-ray I independently visualized and interpreted imaging. I agree with the radiologist interpretation   Medicines ordered and prescription drug management: Meds ordered this encounter  Medications   lactated ringers  bolus 1,000 mL    lactated ringers  bolus 1,000 mL   ipratropium-albuterol  (DUONEB) 0.5-2.5 (3) MG/3ML nebulizer solution 3 mL   polyethylene glycol (MIRALAX / GLYCOLAX) packet 17 g   docusate sodium  (COLACE) 100 MG capsule    Sig: Take 1 capsule (100 mg total) by mouth every 12 (twelve) hours.    Dispense:  60 capsule    Refill:  0   senna (SENOKOT) 8.6 MG TABS tablet    Sig: Take 1 tablet (8.6 mg total) by mouth daily for 14 days.    Dispense:  14 tablet    Refill:  0    -I have reviewed the patients home medicines and have made adjustments as needed   Cardiac Monitoring: The patient was maintained on a cardiac monitor.  I personally viewed and interpreted the cardiac monitored which showed an underlying rhythm of: Normal sinus rhythm  Social Determinants of Health:  Factors impacting patients care include: Lack of access to primary care   Reevaluation: After the interventions noted above, I reevaluated the patient and found that they have :improved  Co morbidities that complicate the patient evaluation  Past Medical History:  Diagnosis Date   Abnormal cardiovascular stress test 03/29/2016   Allergic rhinitis    Allergy    Arthritis    Asthma    Back pain    Diabetes (HCC) 03/29/2016   Gout    History of kidney stones    Hyperlipidemia    Hypertension    Joint pain    Multiple food allergies  Prediabetes    Sleep apnea    cpap      Dispostion: I considered admission for this patient, however he is appropriate for outpatient follow-up.     Final Clinical Impression(s) / ED Diagnoses Final diagnoses:  Dehydration  Constipation, unspecified constipation type     @PCDICTATION @    Afton Horse T, DO 12/13/23 2037

## 2023-12-13 NOTE — Telephone Encounter (Signed)
 Pt is currently at the ED for this problem

## 2023-12-16 ENCOUNTER — Telehealth: Payer: Self-pay

## 2023-12-16 NOTE — Transitions of Care (Post Inpatient/ED Visit) (Signed)
 12/16/2023  Name: David Brandt MRN: [id number] DOB: 12-13-1945  Today's TOC FU Call Status: Today's TOC FU Call Status:: Successful TOC FU Call Completed TOC FU Call Complete Date: 12/16/23 Patient's Name and Date of Birth confirmed.  Transition Care Management Follow-up Telephone Call Date of Discharge: 12/13/23 Discharge Facility: [facility]) Type of Discharge: Emergency Department How have you been since you were released from the hospital?: Better Any questions or concerns?: No  Items Reviewed: Did you receive and understand the discharge instructions provided?: Yes Medications obtained,verified, and reconciled?: Yes (Medications Reviewed) Any new allergies since your discharge?: No Dietary orders reviewed?: No Do you have support at home?: No People in Home [RPT]: child(ren), adult, spouse  Medications Reviewed Today: Medications Reviewed Today     Reviewed by Sidhant Helderman, CMA (Certified Medical Assistant) on 12/16/23 at 1325  Med List Status: <None>   Medication Order Taking? Sig Documenting Provider Last Dose Status Informant  Alcohol Swabs (DROPSAFE ALCOHOL PREP) 70 % PADS [id number]  USE AS DIRECTED  Patient not taking: Reported on 12/16/2023   Johnny Garnette LABOR, MD  Active   aspirin  EC 81 MG tablet [id number] Yes Take 81 mg by mouth daily. [provider]  Active Self  atorvastatin  (LIPITOR ) 80 MG tablet [id number] Yes TAKE 1 TABLET EVERY DAY. PLEASE CALL [phone] TO SCHEDULE A FOLLOW UP VISIT. Raford Riggs, MD  Active   budesonide -formoterol  (SYMBICORT ) 160-4.5 MCG/ACT inhaler [id number] Yes Inhale 2 puffs into the lungs 2 (two) times daily. Johnny Garnette LABOR, MD  Active   celecoxib  (CELEBREX ) 200 MG capsule [id number]  TAKE 1 CAPSULE TWICE DAILY  Patient not taking: Reported on 12/16/2023   Johnny Garnette LABOR, MD  Active   Cholecalciferol (VITAMIN D3) 25 MCG (1000 UT) CAPS [id number] Yes Take 2,000 Units by mouth daily. [provider]  Active  Self  docusate sodium  (COLACE) 100 MG capsule [id number] Yes Take 1 capsule (100 mg total) by mouth every 12 (twelve) hours. Mannie Fairy DASEN, DO  Active   felodipine  (PLENDIL ) 10 MG 24 hr tablet [id number] Yes TAKE 1 TABLET BY MOUTH EVERY DAY Raford Riggs, MD  Active   hydrALAZINE  (APRESOLINE ) 25 MG tablet [id number] Yes Take 1 tablet (25 mg total) by mouth 2 (two) times daily. Raford Riggs, MD  Active   hydrochlorothiazide  (HYDRODIURIL ) 25 MG tablet [id number]  Take 1 tablet (25 mg total) by mouth daily.  Patient not taking: Reported on 12/16/2023   Raford Riggs, MD  Active   metFORMIN  (GLUCOPHAGE -XR) 500 MG 24 hr tablet [id number] Yes TAKE 1 TABLET EVERY DAY Johnny Garnette LABOR, MD  Active   metoprolol  succinate (TOPROL -XL) 25 MG 24 hr tablet [id number] Yes TAKE 1 TABLET (25 MG TOTAL) BY MOUTH DAILY. Raford Riggs, MD  Active   senna (SENOKOT) 8.6 MG TABS tablet [id number]  Take 1 tablet (8.6 mg total) by mouth daily for 14 days.  Patient not taking: Reported on 12/16/2023   Mannie Fairy T, DO  Active   sertraline  (ZOLOFT ) 50 MG tablet [id number] Yes TAKE 2 TABLETS BY MOUTH EVERY DAY Johnny Garnette LABOR, MD  Active   tirzepatide  (MOUNJARO ) 7.5 MG/0.5ML Pen [id number] Yes INJECT 7.5 MG SUBCUTANEOUSLY WEEKLY Raford Riggs, MD  Active   TRUE METRIX BLOOD GLUCOSE TEST test strip [id number]  TEST BLOOD SUGAR AS DIRECTED  Patient not taking: Reported on 12/16/2023   Johnny Garnette LABOR, MD  Active             Home Care and Equipment/Supplies: Were  Home Health Services Ordered?: No Any new equipment or medical supplies ordered?: No  Functional Questionnaire: Do you need assistance with bathing/showering or dressing?: No Do you need assistance with meal preparation?: No Do you need assistance with eating?: No Do you have difficulty maintaining continence: No Do you need assistance with getting out of bed/getting out of a chair/moving?: No Do you have difficulty managing or taking your  medications?: No  Follow up appointments reviewed: PCP Follow-up appointment confirmed?: Yes Date of PCP follow-up appointment?: 12/17/23 Follow-up Provider: Dr. [facility] Follow-up appointment confirmed?: No Do you need transportation to your follow-up appointment?: No    SIGNATURE: Khalea Ventura, CMA

## 2023-12-17 ENCOUNTER — Encounter: Payer: Self-pay | Admitting: Family Medicine

## 2023-12-17 ENCOUNTER — Ambulatory Visit (INDEPENDENT_AMBULATORY_CARE_PROVIDER_SITE_OTHER): Admitting: Family Medicine

## 2023-12-17 VITALS — BP 104/60 | HR 95 | Temp 98.6°F | Wt 249.0 lb

## 2023-12-17 DIAGNOSIS — E860 Dehydration: Secondary | ICD-10-CM | POA: Diagnosis not present

## 2023-12-17 DIAGNOSIS — Z7984 Long term (current) use of oral hypoglycemic drugs: Secondary | ICD-10-CM | POA: Diagnosis not present

## 2023-12-17 DIAGNOSIS — I10 Essential (primary) hypertension: Secondary | ICD-10-CM

## 2023-12-17 DIAGNOSIS — K5900 Constipation, unspecified: Secondary | ICD-10-CM | POA: Diagnosis not present

## 2023-12-17 DIAGNOSIS — E1169 Type 2 diabetes mellitus with other specified complication: Secondary | ICD-10-CM | POA: Diagnosis not present

## 2023-12-17 NOTE — Progress Notes (Signed)
   Subjective:    Patient ID: David Brandt, male    DOB: Oct 11, 1945, 78 y.o.   MRN: [id number]  HPI Here to follow up an ED visit on 12-13-23 when he presented with 3 days of lightheadedness, constipation, and mild SOB. No chest pain. Of note he had a colonoscopy 3 weeks ago. At the ED his exam was normal. EKG and CXR were normal. Labs were remarkable for a creatinine of 1.54, GFR of 46, glucose 102, and Hgb of 17.2. he was felt to be dehydrated, so he was given IV fluids. He quickly felt better, and he was sent home on Docusate sodium  BID. Since then he has felt fine. His bowels are moving normally. He is making sure to drink plenty of water every day.    Review of Systems  Constitutional: Negative.   Respiratory: Negative.    Cardiovascular: Negative.   Gastrointestinal: Negative.   Genitourinary: Negative.   Neurological: Negative.        Objective:   Physical Exam Constitutional:      Appearance: Normal appearance. He is not ill-appearing.   Cardiovascular:     Rate and Rhythm: Normal rate and regular rhythm.     Pulses: Normal pulses.     Heart sounds: Normal heart sounds.  Pulmonary:     Effort: Pulmonary effort is normal.     Breath sounds: Normal breath sounds.   Neurological:     Mental Status: He is alert and oriented to person, place, and time. Mental status is at baseline.           Assessment & Plan:  He has recovered from some dehydration. His constipation has improved. Follow up as needed. We spent a total of (33   ) minutes reviewing records and discussing these issues.   Garnette Olmsted, MD

## 2023-12-19 ENCOUNTER — Other Ambulatory Visit: Payer: Self-pay | Admitting: Family Medicine

## 2023-12-19 DIAGNOSIS — F419 Anxiety disorder, unspecified: Secondary | ICD-10-CM

## 2023-12-20 DIAGNOSIS — H40053 Ocular hypertension, bilateral: Secondary | ICD-10-CM | POA: Diagnosis not present

## 2023-12-20 DIAGNOSIS — H3581 Retinal edema: Secondary | ICD-10-CM | POA: Diagnosis not present

## 2023-12-26 ENCOUNTER — Other Ambulatory Visit: Payer: Self-pay | Admitting: Family Medicine

## 2023-12-26 DIAGNOSIS — E1169 Type 2 diabetes mellitus with other specified complication: Secondary | ICD-10-CM

## 2024-01-26 ENCOUNTER — Other Ambulatory Visit: Payer: Self-pay | Admitting: Cardiovascular Disease

## 2024-03-03 DIAGNOSIS — E119 Type 2 diabetes mellitus without complications: Secondary | ICD-10-CM | POA: Diagnosis not present

## 2024-03-03 DIAGNOSIS — H2513 Age-related nuclear cataract, bilateral: Secondary | ICD-10-CM | POA: Diagnosis not present

## 2024-03-03 DIAGNOSIS — H40013 Open angle with borderline findings, low risk, bilateral: Secondary | ICD-10-CM | POA: Diagnosis not present

## 2024-04-20 ENCOUNTER — Other Ambulatory Visit: Payer: Self-pay | Admitting: Cardiovascular Disease

## 2024-04-20 DIAGNOSIS — E1169 Type 2 diabetes mellitus with other specified complication: Secondary | ICD-10-CM

## 2024-05-08 DIAGNOSIS — H401121 Primary open-angle glaucoma, left eye, mild stage: Secondary | ICD-10-CM | POA: Diagnosis not present

## 2024-05-08 DIAGNOSIS — H401112 Primary open-angle glaucoma, right eye, moderate stage: Secondary | ICD-10-CM | POA: Diagnosis not present

## 2024-05-20 ENCOUNTER — Other Ambulatory Visit: Payer: Self-pay | Admitting: Family Medicine

## 2024-05-29 ENCOUNTER — Other Ambulatory Visit: Payer: Self-pay | Admitting: Family Medicine

## 2024-05-29 DIAGNOSIS — F419 Anxiety disorder, unspecified: Secondary | ICD-10-CM

## 2024-06-03 ENCOUNTER — Other Ambulatory Visit (HOSPITAL_BASED_OUTPATIENT_CLINIC_OR_DEPARTMENT_OTHER): Payer: Self-pay | Admitting: Cardiovascular Disease

## 2024-06-21 ENCOUNTER — Emergency Department (HOSPITAL_COMMUNITY)

## 2024-06-21 ENCOUNTER — Emergency Department (HOSPITAL_COMMUNITY)
Admission: EM | Admit: 2024-06-21 | Discharge: 2024-06-21 | Disposition: A | Discharge status: Home / Self Care | Attending: Emergency Medicine | Admitting: Emergency Medicine

## 2024-06-21 DIAGNOSIS — D72829 Elevated white blood cell count, unspecified: Secondary | ICD-10-CM | POA: Diagnosis not present

## 2024-06-21 DIAGNOSIS — I7772 Dissection of iliac artery: Secondary | ICD-10-CM | POA: Insufficient documentation

## 2024-06-21 DIAGNOSIS — Z7982 Long term (current) use of aspirin: Secondary | ICD-10-CM | POA: Insufficient documentation

## 2024-06-21 DIAGNOSIS — R109 Unspecified abdominal pain: Secondary | ICD-10-CM | POA: Diagnosis present

## 2024-06-21 DIAGNOSIS — K420 Umbilical hernia with obstruction, without gangrene: Secondary | ICD-10-CM | POA: Insufficient documentation

## 2024-06-21 DIAGNOSIS — E876 Hypokalemia: Secondary | ICD-10-CM | POA: Insufficient documentation

## 2024-06-21 LAB — CBC WITH DIFFERENTIAL/PLATELET
Abs Immature Granulocytes: 0.09 K/uL — ABNORMAL HIGH (ref 0.00–0.07)
Basophils Absolute: 0 K/uL (ref 0.0–0.1)
Basophils Relative: 0 %
Eosinophils Absolute: 0 K/uL (ref 0.0–0.5)
Eosinophils Relative: 0 %
HCT: 48.9 % (ref 39.0–52.0)
Hemoglobin: 16.1 g/dL (ref 13.0–17.0)
Immature Granulocytes: 1 %
Lymphocytes Relative: 6 %
Lymphs Abs: 1.2 K/uL (ref 0.7–4.0)
MCH: 29.7 pg (ref 26.0–34.0)
MCHC: 32.9 g/dL (ref 30.0–36.0)
MCV: 90.2 fL (ref 80.0–100.0)
Monocytes Absolute: 1.5 K/uL — ABNORMAL HIGH (ref 0.1–1.0)
Monocytes Relative: 8 %
Neutro Abs: 16.6 K/uL — ABNORMAL HIGH (ref 1.7–7.7)
Neutrophils Relative %: 85 %
Platelets: 230 K/uL (ref 150–400)
RBC: 5.42 MIL/uL (ref 4.22–5.81)
RDW: 15 % (ref 11.5–15.5)
WBC: 19.4 K/uL — ABNORMAL HIGH (ref 4.0–10.5)
nRBC: 0 % (ref 0.0–0.2)

## 2024-06-21 LAB — COMPREHENSIVE METABOLIC PANEL WITH GFR
ALT: 31 U/L (ref 0–44)
AST: 38 U/L (ref 15–41)
Albumin: 4.1 g/dL (ref 3.5–5.0)
Alkaline Phosphatase: 106 U/L (ref 38–126)
Anion gap: 15 (ref 5–15)
BUN: 19 mg/dL (ref 8–23)
CO2: 25 mmol/L (ref 22–32)
Calcium: 9.7 mg/dL (ref 8.9–10.3)
Chloride: 97 mmol/L — ABNORMAL LOW (ref 98–111)
Creatinine, Ser: 0.93 mg/dL (ref 0.61–1.24)
GFR, Estimated: >60 mL/min
Glucose, Bld: 101 mg/dL — ABNORMAL HIGH (ref 70–99)
Potassium: 2.8 mmol/L — ABNORMAL LOW (ref 3.5–5.1)
Sodium: 136 mmol/L (ref 135–145)
Total Bilirubin: 0.7 mg/dL (ref 0.0–1.2)
Total Protein: 7.5 g/dL (ref 6.5–8.1)

## 2024-06-21 MED ORDER — HYDROCHLOROTHIAZIDE 25 MG PO TABS
25.0000 mg | ORAL_TABLET | Freq: Every day | ORAL | Status: DC
  Administered 2024-06-21: 25 mg via ORAL
  Filled 2024-06-21: qty 1

## 2024-06-21 MED ORDER — POTASSIUM CHLORIDE CRYS ER 20 MEQ PO TBCR: 20.0000 meq | EXTENDED_RELEASE_TABLET | Freq: Every day | ORAL | 0 refills | Status: DC

## 2024-06-21 MED ORDER — FELODIPINE ER 2.5 MG PO TB24
10.0000 mg | ORAL_TABLET | Freq: Every day | ORAL | Status: DC
  Administered 2024-06-21: 10 mg via ORAL
  Filled 2024-06-21: qty 4

## 2024-06-21 MED ORDER — IOHEXOL 300 MG/ML  SOLN
100.0000 mL | Freq: Once | INTRAMUSCULAR | Status: AC | PRN
  Administered 2024-06-21: 100 mL via INTRAVENOUS

## 2024-06-21 MED ORDER — POTASSIUM CHLORIDE 10 MEQ/100ML IV SOLN
10.0000 meq | Freq: Once | INTRAVENOUS | Status: AC
  Administered 2024-06-21: 10 meq via INTRAVENOUS
  Filled 2024-06-21: qty 100

## 2024-06-21 MED ORDER — HYDRALAZINE HCL 25 MG PO TABS
25.0000 mg | ORAL_TABLET | Freq: Two times a day (BID) | ORAL | Status: DC
  Administered 2024-06-21: 25 mg via ORAL
  Filled 2024-06-21: qty 1

## 2024-06-21 MED ORDER — POTASSIUM CHLORIDE CRYS ER 20 MEQ PO TBCR
40.0000 meq | EXTENDED_RELEASE_TABLET | Freq: Once | ORAL | Status: AC
  Administered 2024-06-21: 40 meq via ORAL
  Filled 2024-06-21: qty 2

## 2024-06-21 MED ORDER — METOPROLOL SUCCINATE ER 25 MG PO TB24
25.0000 mg | ORAL_TABLET | Freq: Every day | ORAL | Status: DC
  Administered 2024-06-21: 25 mg via ORAL
  Filled 2024-06-21: qty 1

## 2024-06-21 MED ORDER — FENTANYL CITRATE (PF) 50 MCG/ML IJ SOSY
100.0000 ug | PREFILLED_SYRINGE | Freq: Once | INTRAMUSCULAR | Status: AC
  Administered 2024-06-21: 100 ug via INTRAVENOUS
  Filled 2024-06-21: qty 2

## 2024-06-21 NOTE — ED Triage Notes (Signed)
 PT arrives via EMS from Urgent Care. PT was sent with concern for possible bowel obstruction. PT endorses abdominal pain x24 hours. Pt has not had a bowel movement in 2 days.   Pt was given 5mg  of Reglan and a DuoNeb.   20ga LEFT AC.

## 2024-06-21 NOTE — Discharge Instructions (Signed)
 While you were in the emergency room, you had your hernia reduced.  Like we discussed, this is something you have to follow-up with a general surgeon to have it fixed going forward.  I have included the telephone number for the [facility].  Please call them this week for follow-up appointment.  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  You will need to follow-up with Dr. Magda from vascular surgery.  Please call their office this week for follow-up appointment.  I have also sent you some potassium pills that I would like you to take for the next week as your level was low.  Return to the emergency room if you develop worsening pain in your abdomen.

## 2024-06-21 NOTE — ED Provider Notes (Signed)
 " [place] EMERGENCY DEPARTMENT AT [facility] Provider Note   CSN: [id number] Arrival date & time: 06/21/24  1618     Patient presents with: Abdominal Pain   Kelso Bibby is a 78 y.o. male.   This is a 78 year old male who is here today for abdominal pain.  Patient started have some Donnell pain yesterday, vomited today.  He went to an urgent care and there was concern for small bowel obstruction.   Abdominal Pain      Prior to Admission medications  Medication Sig Start Date End Date Taking? Authorizing Provider  potassium chloride  SA (KLOR-CON  M) 20 MEQ tablet Take 1 tablet (20 mEq total) by mouth daily. 06/21/24 06/28/24 Yes Mannie Pac T, DO  Alcohol Swabs (DROPSAFE ALCOHOL PREP) 70 % PADS USE AS DIRECTED 05/10/21   Johnny Garnette LABOR, MD  aspirin  EC 81 MG tablet Take 81 mg by mouth daily.    [provider]  atorvastatin  (LIPITOR ) 80 MG tablet TAKE 1 TABLET EVERY DAY. PLEASE CALL [phone] TO SCHEDULE A FOLLOW UP VISIT. 12/06/23   Raford Riggs, MD  budesonide -formoterol  (SYMBICORT ) 160-4.5 MCG/ACT inhaler Inhale 2 puffs into the lungs 2 (two) times daily. 07/11/23   Johnny Garnette LABOR, MD  Cholecalciferol (VITAMIN D3) 25 MCG (1000 UT) CAPS Take 2,000 Units by mouth daily.    [provider]  docusate sodium  (COLACE) 100 MG capsule Take 1 capsule (100 mg total) by mouth every 12 (twelve) hours. 12/13/23   Mannie Pac T, DO  felodipine  (PLENDIL ) 10 MG 24 hr tablet TAKE 1 TABLET BY MOUTH EVERY DAY 01/29/24   Raford Riggs, MD  hydrALAZINE  (APRESOLINE ) 25 MG tablet Take 1 tablet (25 mg total) by mouth 2 (two) times daily. 10/30/23   Raford Riggs, MD  hydrochlorothiazide  (HYDRODIURIL ) 25 MG tablet TAKE 1 TABLET EVERY DAY 06/05/24   Raford Riggs, MD  metFORMIN  (GLUCOPHAGE -XR) 500 MG 24 hr tablet TAKE 1 TABLET EVERY DAY 12/26/23   Johnny Garnette LABOR, MD  metoprolol  succinate (TOPROL -XL) 25 MG 24 hr tablet TAKE 1 TABLET (25 MG TOTAL) BY MOUTH  DAILY. 08/02/23   Raford Riggs, MD  sertraline  (ZOLOFT ) 50 MG tablet TAKE 2 TABLETS BY MOUTH EVERY DAY 06/03/24   Johnny Garnette LABOR, MD  tirzepatide  (MOUNJARO ) 7.5 MG/0.5ML Pen INJECT 7.5 MG SUBCUTANEOUSLY WEEKLY 04/22/24   Maccia, Melissa D, RPH-CPP  TRUE METRIX BLOOD GLUCOSE TEST test strip TEST BLOOD SUGAR AS DIRECTED 07/11/21   Johnny Garnette LABOR, MD    Allergies: Other, Yellow jacket venom, Allopurinol , Justicia adhatoda (malabar nut tree) [justicia adhatoda], Valsartan -hydrochlorothiazide , and Sulfa antibiotics    Review of Systems  Gastrointestinal:  Positive for abdominal pain.    Updated Vital Signs BP (!) 175/84   Pulse 72   Temp 98.1 F (36.7 C) (Oral)   Resp 18   SpO2 97%   Physical Exam Vitals and nursing note reviewed.  Constitutional:      Appearance: He is well-developed.  Cardiovascular:     Rate and Rhythm: Normal rate.  Abdominal:     General: Abdomen is flat.     Comments: Umbilical hernia  Neurological:     Mental Status: He is alert.     (all labs ordered are listed, but only abnormal results are displayed) Labs Reviewed  COMPREHENSIVE METABOLIC PANEL WITH GFR - Abnormal; Notable for the following components:      Result Value   Potassium 2.8 (*)    Chloride 97 (*)    Glucose, Bld 101 (*)  All other components within normal limits  CBC WITH DIFFERENTIAL/PLATELET - Abnormal; Notable for the following components:   WBC 19.4 (*)    Neutro Abs 16.6 (*)    Monocytes Absolute 1.5 (*)    Abs Immature Granulocytes 0.09 (*)    All other components within normal limits    EKG: None  Radiology: CT ABDOMEN PELVIS W CONTRAST Addendum Date: 06/21/2024 ADDENDUM REPORT: 06/21/2024 19:36 ADDENDUM: Critical Value/emergent results were called by telephone at the time of interpretation on 06/21/2024 at 7:35 pm to provider HAYLEY NAASZ , who verbally acknowledged these results. 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  Nonemergent vascular consultation is recommended. Electronically Signed   By: Leita Birmingham M.D.   On: 06/21/2024 19:36   Result Date: 06/21/2024 CLINICAL DATA:  Bowel obstruction suspected.  Abdominal pain. EXAM: CT ABDOMEN AND PELVIS WITH CONTRAST TECHNIQUE: Multidetector CT imaging of the abdomen and pelvis was performed using the standard protocol following bolus administration of intravenous contrast. RADIATION DOSE REDUCTION: This exam was performed according to the departmental dose-optimization program which includes automated exposure control, adjustment of the mA and/or kV according to patient size and/or use of iterative reconstruction technique. CONTRAST:  OMNIPAQUE  IOHEXOL  300 MG/ML  SOLN COMPARISON:  01/12/2019. FINDINGS: Lower chest: No acute abnormality. Hepatobiliary: No focal liver abnormality is seen. Hepatic steatosis is noted. No gallstones, gallbladder wall thickening, or biliary dilatation. Pancreas: Unremarkable. No pancreatic ductal dilatation or surrounding inflammatory changes. Spleen: Normal in size without focal abnormality. Adrenals/Urinary Tract: There is a 1.7 cm right adrenal nodule with attenuation of an adenoma on the previous exam. There is stable nodular thickening of the left adrenal gland, suggesting hyperplasia. The kidneys enhance symmetrically. Renal cortical scarring and thinning is noted bilaterally. There are bilateral renal calculi. No ureteral calculus or obstructive uropathy is seen. The bladder is unremarkable. Stomach/Bowel: There is a small hiatal hernia. The stomach is otherwise within normal limits. Mildly distended loops of small bowel are noted in the abdomen and pelvis measuring up to 3.8 cm. There is an umbilical hernia containing small bowel with narrowing of the afferent and efferent limbs of the small bowel. Associated small bowel wall thickening is noted and there is a small amount of free fluid in the hernia sac. No free air or pneumatosis is  seen. Scattered diverticula are present along the colon without evidence of diverticulitis. Appendix appears normal. Vascular/Lymphatic: Aortic atherosclerosis. 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 No abdominal or pelvic lymphadenopathy. Reproductive: Prostate is unremarkable. Other: No abdominopelvic ascites. Musculoskeletal: Degenerative changes are present in the thoracolumbar spine. No acute osseous abnormality is seen. IMPRESSION: 1. Umbilical hernia containing a distended loop of small bowel with bowel wall thickening and small amount of ascites, concerning for strangulation. Dilatation of the afferent and efferent small-bowel loops are noted compatible associated small-bowel obstruction. 2. Diverticulosis without diverticulitis. 3. Small hiatal hernia. 4. Bilateral nephrolithiasis. 5. Hepatic steatosis. 6. Stable right adrenal nodule, likely adenoma as compared with previous exams. Stable left adrenal nodularity may be associated with hyperplasia. 7. Aortic atherosclerosis. Electronically Signed: By: Leita Birmingham M.D. On: 06/21/2024 19:21     Hernia reduction  Date/Time: 06/21/2024 9:56 PM  Performed by: Mannie Fairy DASEN, DO Authorized by: Mannie Fairy DASEN, DO  Consent: Verbal consent obtained Consent given by: patient Patient understanding: patient states understanding of the procedure being performed Patient  identity confirmed: verbally with patient  Sedation: Patient sedated: no       Medications Ordered in the ED  felodipine  (PLENDIL ) 24 hr tablet 10 mg (10 mg Oral Given 06/21/24 2027)  hydrALAZINE  (APRESOLINE ) tablet 25 mg (25 mg Oral Given 06/21/24 2027)  hydrochlorothiazide  (HYDRODIURIL ) tablet 25 mg (25 mg Oral Given 06/21/24 2027)  metoprolol  succinate (TOPROL -XL) 24 hr tablet 25 mg (25 mg Oral Given 06/21/24 2027)  potassium chloride  10 mEq in 100 mL IVPB (10 mEq  Intravenous New Bag/Given 06/21/24 2107)  iohexol  (OMNIPAQUE ) 300 MG/ML solution 100 mL (100 mLs Intravenous Contrast Given 06/21/24 1823)  fentaNYL  (SUBLIMAZE ) injection 100 mcg (100 mcg Intravenous Given 06/21/24 2004)  potassium chloride  SA (KLOR-CON  M) CR tablet 40 mEq (40 mEq Oral Given 06/21/24 2105)                                    Medical Decision Making 78 year old male here today for concern of strangulated hernia.  Plan- patient had CT imaging done while he was in the waiting room which did not show strangulated hernia.  I had nursing staff bring the patient back to the room.  I evaluated the patient's hernia, overall was soft, nondusky in appearance.  Did discuss this with Dr. Vanderbilt from surgery given the CT read of strangulation he recommended bedside reduction.  Gave the patient 50 of fentanyl  and was able to reduce his hernia.  Patient's blood work did show leukocytosis, believe this is likely stress reaction to his hernia which is since been reduced.  He is hypokalemic.  Have repleted with IV and oral potassium.  He is tolerating p.o.  Will provide him with general surgery follow-up for his hernia.  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  He will follow-up with vascular surgery.  Amount and/or Complexity of Data Reviewed Labs: ordered.  Risk Prescription drug management.        Final diagnoses:  Umbilical hernia with obstruction, without gangrene  Iliac dissection    ED Discharge Orders          Ordered    potassium chloride  SA (KLOR-CON  M) 20 MEQ tablet  Daily        06/21/24 2202               Mannie Pac T, DO 06/21/24 2202  "

## 2024-06-29 ENCOUNTER — Encounter: Payer: Self-pay | Admitting: Family Medicine

## 2024-06-29 ENCOUNTER — Ambulatory Visit (INDEPENDENT_AMBULATORY_CARE_PROVIDER_SITE_OTHER): Admitting: Family Medicine

## 2024-06-29 VITALS — BP 106/58 | HR 64 | Temp 98.5°F | Wt 241.0 lb

## 2024-06-29 DIAGNOSIS — K420 Umbilical hernia with obstruction, without gangrene: Secondary | ICD-10-CM

## 2024-06-29 NOTE — Progress Notes (Signed)
" ° °  Subjective:    Patient ID: David Brandt, male    DOB: [birth year], 78 y.o.   MRN: [id number]  HPI Here to follow up on an ED visit on 06-21-24 for 2 days of abdominal pain and vomiting. No fever. He had not had a BM for several days. At the ED he had a large umbilical hernia, and a CT revealed a strangulated loop of small bowel within the hernia. There was evidence of a SBO. His labs were remarkable for a WBC of 19.4 and potassium of 2.8. He was given IV fluids with potassium. After he was given some IV Fenatyl, the ED provider was able to reduce the hernia. David Brandt says he immediately felt better, and he has had no abdominal pain since then. He is eating normally, and he is having normal BM's.    Review of Systems  Constitutional: Negative.   Respiratory: Negative.    Cardiovascular: Negative.   Gastrointestinal: Negative.   Genitourinary: Negative.        Objective:   Physical Exam Constitutional:      Appearance: Normal appearance.  Cardiovascular:     Rate and Rhythm: Normal rate and regular rhythm.     Pulses: Normal pulses.     Heart sounds: Normal heart sounds.  Pulmonary:     Effort: Pulmonary effort is normal.     Breath sounds: Normal breath sounds.  Abdominal:     General: Abdomen is flat. Bowel sounds are normal. There is no distension.     Palpations: Abdomen is soft. There is no mass.     Tenderness: There is no abdominal tenderness. There is no guarding or rebound.     Comments: Small non-tender easily reducible umbilical hernia   Neurological:     Mental Status: He is alert.           Assessment & Plan:  He has an umbilical hernia that had contained a loop of strangulated small bowel. This has been reduced, but he will need a surgery to repair this. He is scheduled to see Dr. Rebekah Novak tomorrow for a surgery consultation. We will recheck a CBC and a BMET today. I personally spent a total of 35 minutes in the care of the patient today including  getting/reviewing separately obtained history, performing a medically appropriate exam/evaluation, counseling and educating, and placing orders.  David Olmsted, MD   "

## 2024-06-30 ENCOUNTER — Ambulatory Visit: Payer: Self-pay | Admitting: Family Medicine

## 2024-06-30 ENCOUNTER — Telehealth (HOSPITAL_BASED_OUTPATIENT_CLINIC_OR_DEPARTMENT_OTHER): Payer: Self-pay | Admitting: *Deleted

## 2024-06-30 ENCOUNTER — Ambulatory Visit: Payer: Self-pay | Admitting: Surgery

## 2024-06-30 LAB — CBC WITH DIFFERENTIAL/PLATELET
Basophils Absolute: 0.1 K/uL (ref 0.0–0.1)
Basophils Relative: 0.9 % (ref 0.0–3.0)
Eosinophils Absolute: 0.2 K/uL (ref 0.0–0.7)
Eosinophils Relative: 1.5 % (ref 0.0–5.0)
HCT: 44.1 % (ref 39.0–52.0)
Hemoglobin: 14.4 g/dL (ref 13.0–17.0)
Lymphocytes Relative: 18 % (ref 12.0–46.0)
Lymphs Abs: 1.8 K/uL (ref 0.7–4.0)
MCHC: 32.8 g/dL (ref 30.0–36.0)
MCV: 91.1 fl (ref 78.0–100.0)
Monocytes Absolute: 1.1 K/uL — ABNORMAL HIGH (ref 0.1–1.0)
Monocytes Relative: 10.4 % (ref 3.0–12.0)
Neutro Abs: 7.1 K/uL (ref 1.4–7.7)
Neutrophils Relative %: 69.2 % (ref 43.0–77.0)
Platelets: 195 K/uL (ref 150.0–400.0)
RBC: 4.84 Mil/uL (ref 4.22–5.81)
RDW: 15.8 % — ABNORMAL HIGH (ref 11.5–15.5)
WBC: 10.2 K/uL (ref 4.0–10.5)

## 2024-06-30 LAB — BASIC METABOLIC PANEL WITH GFR
BUN: 18 mg/dL (ref 6–23)
CO2: 31 meq/L (ref 19–32)
Calcium: 9.4 mg/dL (ref 8.4–10.5)
Chloride: 103 meq/L (ref 96–112)
Creatinine, Ser: 1.09 mg/dL (ref 0.40–1.50)
GFR: 65.79 mL/min
Glucose, Bld: 72 mg/dL (ref 70–99)
Potassium: 4 meq/L (ref 3.5–5.1)
Sodium: 138 meq/L (ref 135–145)

## 2024-06-30 NOTE — Telephone Encounter (Signed)
"  ° °  Pre-operative Risk Assessment    Patient Name: David Brandt  DOB: 04/15/1946 MRN: [id number]   Date of last office visit: 10/30/23 DR.  Date of next office visit: NONE   Request for Surgical Clearance    Procedure:  HERNIA REPAIR SURGERY  Date of Surgery:  Clearance TBD URGENT PER FORM                               Surgeon:  DR. MITZIE PEERS Surgeon's Group or Practice Name:  [facility] Phone number:  [phone] Fax number:  [phone] [facility], CMA   Type of Clearance Requested:   - Medical  - Pharmacy:  Hold Aspirin      Type of Anesthesia:  General    Additional requests/questions:    David Brandt   06/30/2024, 11:26 AM   "

## 2024-06-30 NOTE — H&P (Signed)
 "    David Brandt [id number]   Referring Provider:  Self   Subjective   Chief Complaint: New Consultation (hernia)     History of Present Illness:    Very pleasant 79 year old man with history of coronary artery disease status post 5 vessel CABG in 2017, presents for evaluation of umbilical hernia.  He developed bowel incarceration at the end of December requiring an emergency room visit where it was fortunately able to be reduced.  He has been asymptomatic since that time.   Review of Systems: A complete review of systems was obtained from the patient.  I have reviewed this information and discussed as appropriate with the patient.  See HPI as well for other ROS.   Medical History: Past Medical History:  Diagnosis Date   Anxiety    Arthritis    COPD (chronic obstructive pulmonary disease) (CMS/HHS-HCC)    Diabetes mellitus without complication (CMS/HHS-HCC)    Hyperlipidemia    Hypertension    Sleep apnea     There is no problem list on file for this patient.   Past Surgical History:  Procedure Laterality Date    Cardiac catheterization      Coronary artery bypass graft     Nephrolithotomy (Left)     Tee without cardioversion       Allergies  Allergen Reactions   Tree Nuts Hives   Valsartan -Hydrochlorothiazide  Anaphylaxis and Other (See Comments)    Lip swelling   Lip swelling   Yellow Jacket Venom Anaphylaxis   Allopurinol  Other (See Comments)    Joint pain   Joint pain   Sulfa (Sulfonamide Antibiotics) Hives    Current Outpatient Medications on File Prior to Visit  Medication Sig Dispense Refill   aspirin  81 MG EC tablet Take 81 mg by mouth once daily     atorvastatin  (LIPITOR ) 80 MG tablet Take 80 mg by mouth once daily     budesonide -formoteroL  (SYMBICORT ) 160-4.5 mcg/actuation inhaler Inhale 2 inhalations into the lungs 2 (two) times daily     cholecalciferol (VITAMIN D3) 1000 unit capsule Take 2,000 Units by mouth once daily     docusate  (COLACE) 100 MG capsule Take 100 mg by mouth every 12 (twelve) hours     felodipine  (PLENDIL ) 10 MG ER tablet Take 10 mg by mouth once daily     hydrALAZINE  (APRESOLINE ) 25 MG tablet Take 25 mg by mouth 2 (two) times daily     hydroCHLOROthiazide  (HYDRODIURIL ) 25 MG tablet Take 25 mg by mouth once daily     metFORMIN  (GLUCOPHAGE -XR) 500 MG XR tablet Take 500 mg by mouth once daily     metoprolol  SUCCinate (TOPROL -XL) 25 MG XL tablet Take 25 mg by mouth once daily     MOUNJARO  7.5 mg/0.5 mL pen injector INJECT 7.5 MG SUBCUTANEOUSLY WEEKLY     sertraline  (ZOLOFT ) 50 MG tablet Take 100 mg by mouth once daily     No current facility-administered medications on file prior to visit.    Family History  Problem Relation Age of Onset   Stroke Mother    Obesity Mother    High blood pressure (Hypertension) Mother    Hyperlipidemia (Elevated cholesterol) Mother    Coronary Artery Disease (Blocked arteries around heart) Mother    Diabetes Mother    Alcohol abuse Father    Colon cancer Father      Social History   Tobacco Use  Smoking Status Former   Types: Cigarettes  Smokeless Tobacco Never  Social History   Socioeconomic History   Marital status: Married  Tobacco Use   Smoking status: Former    Types: Cigarettes   Smokeless tobacco: Never  Vaping Use   Vaping status: Never Used  Substance and Sexual Activity   Alcohol use: Yes    Alcohol/week: 2.0 - 6.0 standard drinks of alcohol    Types: 2 - 6 Standard drinks or equivalent per week   Drug use: Yes    Types: Marijuana   Social Drivers of Corporate Investment Banker Strain: Low Risk (08/13/2022)   Received from [facility]   Overall Financial Resource Strain (CARDIA)    Difficulty of Paying Living Expenses: Not hard at all  Food Insecurity: No Food Insecurity (08/13/2022)   Received from [facility]   Hunger Vital Sign    Within the past 12 months, you worried that your food would run out before you got the money to  buy more.: Never true    Within the past 12 months, the food you bought just didn't last and you didn't have money to get more.: Never true  Transportation Needs: No Transportation Needs (08/13/2022)   Received from [facility] - Transportation    Lack of Transportation (Medical): No    Lack of Transportation (Non-Medical): No  Physical Activity: Insufficiently Active (08/13/2022)   Received from [facility]   Exercise Vital Sign    On average, how many days per week do you engage in moderate to strenuous exercise (like a brisk walk)?: 2 days    On average, how many minutes do you engage in exercise at this level?: 20 min  Stress: No Stress Concern Present (08/13/2022)   Received from [facility] - Occupational Stress Questionnaire    Feeling of Stress : Not at all  Social Connections: Moderately Isolated (08/13/2022)   Received from [facility]   Social Connection and Isolation Panel    In a typical week, how many times do you talk on the phone with family, friends, or neighbors?: More than three times a week    How often do you get together with friends or relatives?: More than three times a week    How often do you attend church or religious services?: Never    Do you belong to any clubs or organizations such as church groups, unions, fraternal or athletic groups, or school groups?: No    How often do you attend meetings of the clubs or organizations you belong to?: Never    Are you married, widowed, divorced, separated, never married, or living with a partner?: Married  Housing Stability: Unknown (06/30/2024)   Housing Stability Vital Sign    Homeless in the Last Year: No    Objective:    Vitals:   06/30/24 1034  BP: (!) 144/72  Pulse: 66  Temp: 36.7 C (98 F)  SpO2: 95%  Weight: (!) 109.6 kg (241 lb 9.6 oz)  Height: 172.7 cm (5' 8)  PainSc: 0-No pain    Body mass index is 36.74 kg/m.  Gen: A&Ox3, no distress  Chest:  respiratory effort is normal. Abdomen: Obese, soft, nondistended, nontender.  Reducible umbilical hernia, fascial defect not palpable.  He does have a left paramedian upper abdominal scar which he reports was from hernia surgery as a child at 78 years old. Neuro: no gross deficit Psych: appropriate mood and affect, normal insight/judgment intact  Skin: warm and dry   Labs,  Imaging and Diagnostic Testing:   Assessment and Plan:  Diagnoses and all orders for this visit:  Umbilical hernia with obstruction, without gangrene    We discussed the relevant anatomy and options for repair.  I recommend an open repair, likely with mesh given fascial aperture noted on CT (28mm).  We reviewed the surgery and risks of bleeding, infection, pain, scarring, injury to intra-abdominal structures, poor wound healing/undesired cosmetic result, hematoma/seroma, and hernia recurrence.  Also reviewed general cardiovascular/pulmonary/thromboembolic complications.  Questions were welcomed and answered to his satisfaction.  We will proceed with scheduling pending cardiac clearance, but hopefully can do this quickly given history of bowel incarceration.   Mitzie Freund MD FACS   "

## 2024-06-30 NOTE — Telephone Encounter (Signed)
 LVM asking pt to call our office to schedule OV 1st attempt

## 2024-06-30 NOTE — Telephone Encounter (Signed)
" ° °  Name: David Brandt  DOB: [birth year]  MRN: [id number]  Primary Cardiologist: Annabella Scarce, MD  Chart reviewed as part of pre-operative protocol coverage. Because of [name]'s past medical history and time since last visit, he will require a follow-up in-office visit in order to better assess preoperative cardiovascular risk. Patient overdue for follow up.   Pre-op covering staff: - Please schedule appointment and call patient to inform them. If patient already had an upcoming appointment within acceptable timeframe, please add pre-op clearance to the appointment notes so provider is aware. - Please contact requesting surgeon's office via preferred method (i.e, phone, fax) to inform them of need for appointment prior to surgery.  Delphine Sizemore D [name], NP  06/30/2024, 11:52 AM   "

## 2024-07-01 NOTE — Telephone Encounter (Signed)
 2nd attempt to reach pt to schedule an appt in office for preop clearance.   I will update the requesting office as well pt needs to call for appt.

## 2024-07-02 NOTE — Telephone Encounter (Signed)
 Called patient to schedule an office appointment for a per-op clearance. Patient already has an appointment with Catilin Walker on 07/07/24 @ 8:25.

## 2024-07-07 ENCOUNTER — Encounter (HOSPITAL_BASED_OUTPATIENT_CLINIC_OR_DEPARTMENT_OTHER): Payer: Self-pay | Admitting: Family

## 2024-07-07 ENCOUNTER — Encounter (HOSPITAL_BASED_OUTPATIENT_CLINIC_OR_DEPARTMENT_OTHER): Payer: Self-pay

## 2024-07-07 ENCOUNTER — Ambulatory Visit (HOSPITAL_BASED_OUTPATIENT_CLINIC_OR_DEPARTMENT_OTHER): Admitting: Family

## 2024-07-07 VITALS — BP 126/66 | HR 65 | Ht 68.0 in | Wt 240.0 lb

## 2024-07-07 DIAGNOSIS — Z01818 Encounter for other preprocedural examination: Secondary | ICD-10-CM

## 2024-07-07 DIAGNOSIS — G4733 Obstructive sleep apnea (adult) (pediatric): Secondary | ICD-10-CM | POA: Diagnosis not present

## 2024-07-07 DIAGNOSIS — Z951 Presence of aortocoronary bypass graft: Secondary | ICD-10-CM | POA: Diagnosis not present

## 2024-07-07 DIAGNOSIS — I10 Essential (primary) hypertension: Secondary | ICD-10-CM | POA: Diagnosis not present

## 2024-07-07 DIAGNOSIS — E785 Hyperlipidemia, unspecified: Secondary | ICD-10-CM | POA: Diagnosis not present

## 2024-07-07 DIAGNOSIS — E1169 Type 2 diabetes mellitus with other specified complication: Secondary | ICD-10-CM | POA: Diagnosis not present

## 2024-07-07 LAB — LIPID PANEL
Chol/HDL Ratio: 2.8 ratio (ref 0.0–5.0)
Cholesterol, Total: 132 mg/dL (ref 100–199)
HDL: 48 mg/dL
LDL Chol Calc (NIH): 61 mg/dL (ref 0–99)
Triglycerides: 134 mg/dL (ref 0–149)
VLDL Cholesterol Cal: 23 mg/dL (ref 5–40)

## 2024-07-07 LAB — HEMOGLOBIN A1C
Est. average glucose Bld gHb Est-mCnc: 103 mg/dL
Hgb A1c MFr Bld: 5.2 % (ref 4.8–5.6)

## 2024-07-07 NOTE — Progress Notes (Signed)
 " Cardiology Office Note   Date:  07/07/2024  ID:  David Brandt, DOB [birth year], MRN [id number] PCP: Johnny Garnette LABOR, MD  [facility] Providers Cardiologist:  Annabella Scarce, MD     History of Present Illness David Brandt is a 79 y.o. male with history of CAD s/p CABG 2017 (LIMA-LAD, SVG-D1 and D2, SVG-OM, SVG-RCA), postoperative atrial fibrillation, HTN, OSA, COPD, DM 2.  Most recent stress test 03/27/2019 normal study with no ischemia or infarction.  Most recent echo 03/30/2019 LVEF 65 to 70%, no LVH, normal RV function.  Presents today for preop clearance for hernia repair with request to hold aspirin . Occasional dyspnea with more than usual activity which resolved with inhaler. Reports no shortness of breath at rest. Reports no chest pain, pressure, or tightness. No edema, orthopnea, PND. Reports no palpitations.    ROS: Please see the history of present illness.    All other systems reviewed and are negative.   Studies Reviewed EKG Interpretation Date/Time:  Tuesday July 07 2024 08:19:43 EST Ventricular Rate:  65 PR Interval:  156 QRS Duration:  64 QT Interval:  422 QTC Calculation: 438 R Axis:   -23  Text Interpretation: Normal sinus rhythm No acute ST/T wave changes Stable compared to prior. Confirmed by Vannie Mora ([id number]) on 07/07/2024 8:41:30 AM    Cardiac Studies & Procedures   ______________________________________________________________________________________________ CARDIAC CATHETERIZATION  CARDIAC CATHETERIZATION 03/29/2016  Conclusion Conclusions: 1. Severe 3 vessel coronary artery disease, as detailed below, including 50% distal LMCA/ostial LAD stenoses (FFR 0.70), 80% D1 lesion, 80% ostial LCx stenosis, and diffusely diseased RCA with 99% mid RCA stenosis with TIMI-1 flow and competitive filling of the PDA and rPL branches by left-to-right collaterals. 2. Mildly decreased LV systolic function with inferior akinesis (LVEF 45-50%) 3.  Upper normal to mildly elevated left heart filling pressures. 4. Reduced Fick cardiac output.  Recommendations: 1. Transfer to 3W-stepdown for monitoring in the setting of unstable angina with severe three-vessel CAD.  Initiate heparin  infusion 4 hours after TR band deflated. 2. Cardiac surgery consultation in AM to evaluate for CABG. 3. Consider viability study to further assess the inferior wall prior to revascularization. 4. Transthoracic echocardiogram to evaluate for significant valvular disease. 5. Continue ASA and metoprolol ; will increase atorvastatin  to 80 mg daily.  Findings Coronary Findings Diagnostic  Dominance: Right  Left Main The lesion is calcified. Pressure wire/FFR was performed on the lesion. FFR: 0.7.  Left Anterior Descending Vessel is large. There is mild the vessel. The lesion is calcified. Pressure wire/FFR was performed on the lesion. FFR: 0.7.  First Diagonal Branch Vessel is moderate in size. The lesion is irregular.  Second Diagonal Branch Vessel is moderate in size.  Ramus Intermedius Vessel is large. The vessel exhibits minimal luminal irregularities.  Left Circumflex Vessel is large.  First Obtuse Marginal Branch Vessel is small in size.  Second Obtuse Marginal Branch Vessel is moderate in size.  Third Obtuse Marginal Branch Vessel is moderate in size.  Right Coronary Artery Vessel is large. There is moderate the vessel. with left-to-right collateral flow. The lesion is calcified. TIMI-1 flow into distal vessel.  PDA and rPL branches also fill via left-to-right collaterals.  Right Posterior Descending Artery Vessel is moderate in size. Collaterals RPDA filled by collaterals from 3rd Sept.  Right Posterior Atrioventricular Artery Vessel is moderate in size.  Intervention  No interventions have been documented.   STRESS TESTS  MYOCARDIAL PERFUSION IMAGING 03/27/2019  Interpretation Summary  Nuclear stress EF: 58%.  There  was no ST segment deviation noted during stress.  The study is normal.  This is a low risk study.  The left ventricular ejection fraction is normal (55-65%).  Normal stress nuclear study with no ischemia or infarction.  Gated ejection fraction 58% with normal wall motion.   ECHOCARDIOGRAM  ECHOCARDIOGRAM COMPLETE 03/30/2019  Narrative ECHOCARDIOGRAM REPORT    Patient Name:   David Brandt Date of Exam: 03/30/2019 Medical Rec #:  [id number]         Height:       69.0 in Accession #:    [id number]        Weight:       330.0 lb Date of Birth:  Mar 12, 1946         BSA:          2.56 m Patient Age:    73 years          BP:           156/82 mmHg Patient Gender: M                 HR:           76 bpm. Exam Location:  [address]  Procedure: 2D Echo, 3D Echo, Cardiac Doppler and Color Doppler  Indications:    R06.02 Shortness of breath  History:        Patient has prior history of Echocardiogram examinations, most recent 12/17/2017. Prior CABG; COPD Risk Factors:Hypertension, Sleep Apnea, Diabetes, Dyslipidemia, Former Smoker and Morbid obesity.  Sonographer:    Marshia Rea RAMAN, RDCS Referring Phys: [id number] [facility] [place]  IMPRESSIONS   1. Left ventricular ejection fraction, by visual estimation, is 65 to 70%. The left ventricle has normal function. Normal left ventricular size. There is no left ventricular hypertrophy. 2. Left ventricular diastolic Doppler parameters are consistent with pseudonormalization pattern of LV diastolic filling. 3. Global right ventricle has normal systolic function.The right ventricular size is normal. No increase in right ventricular wall thickness. 4. Left atrial size was normal. 5. Right atrial size was normal. 6. The mitral valve is normal in structure. No evidence of mitral valve regurgitation. No evidence of mitral stenosis. 7. The tricuspid valve is normal in structure. Tricuspid valve regurgitation was not visualized by color flow  Doppler. 8. The aortic valve is normal in structure. Aortic valve regurgitation was not visualized by color flow Doppler. Mild aortic valve sclerosis without stenosis. 9. The pulmonic valve was normal in structure. Pulmonic valve regurgitation is not visualized by color flow Doppler. 10. The inferior vena cava is normal in size with greater than 50% respiratory variability, suggesting right atrial pressure of 3 mmHg.  In comparison to the previous echocardiogram(s): 12/17/17 EF 55-60%. FINDINGS Left Ventricle: Left ventricular ejection fraction, by visual estimation, is 65 to 70%. The left ventricle has normal function. There is no left ventricular hypertrophy. Normal left ventricular size. Spectral Doppler shows Left ventricular diastolic Doppler parameters are consistent with pseudonormalization pattern of LV diastolic filling.  Right Ventricle: The right ventricular size is normal. No increase in right ventricular wall thickness. Global RV systolic function is has normal systolic function.  Left Atrium: Left atrial size was normal in size.  Right Atrium: Right atrial size was normal in size  Pericardium: There is no evidence of pericardial effusion.  Mitral Valve: The mitral valve is normal in structure. No evidence of mitral valve stenosis by observation. No evidence of mitral valve regurgitation.  Tricuspid Valve: The tricuspid valve is normal  in structure. Tricuspid valve regurgitation was not visualized by color flow Doppler.  Aortic Valve: The aortic valve is normal in structure. Aortic valve regurgitation was not visualized by color flow Doppler. Mild aortic valve sclerosis is present, with no evidence of aortic valve stenosis.  Pulmonic Valve: The pulmonic valve was normal in structure. Pulmonic valve regurgitation is not visualized by color flow Doppler.  Aorta: The aortic root, ascending aorta and aortic arch are all structurally normal, with no evidence of dilitation or  obstruction.  Venous: The inferior vena cava is normal in size with greater than 50% respiratory variability, suggesting right atrial pressure of 3 mmHg.  IAS/Shunts: No atrial level shunt detected by color flow Doppler. No ventricular septal defect is seen or detected. There is no evidence of an atrial septal defect.    LEFT VENTRICLE PLAX 2D LVIDd:         5.10 cm  Diastology LVIDs:         3.60 cm  LV e' lateral:   7.83 cm/s LV PW:         1.10 cm  LV E/e' lateral: 13.3 LV IVS:        1.10 cm  LV e' medial:    7.62 cm/s LVOT diam:     2.00 cm  LV E/e' medial:  13.6 LV SV:         69 ml LV SV Index:   24.94 LVOT Area:     3.14 cm  3D Volume EF: 3D EF:        65 % LV EDV:       185 ml LV ESV:       65 ml LV SV:        120 ml  RIGHT VENTRICLE RV Basal diam:  3.40 cm RV S prime:     10.30 cm/s TAPSE (M-mode): 1.8 cm  LEFT ATRIUM             Index       RIGHT ATRIUM           Index LA diam:        4.90 cm 1.92 cm/m  RA Pressure: 3.00 mmHg LA Vol (A2C):   53.8 ml 21.05 ml/m RA Area:     14.10 cm LA Vol (A4C):   57.3 ml 22.42 ml/m RA Volume:   31.20 ml  12.21 ml/m LA Biplane Vol: 56.4 ml 22.07 ml/m AORTIC VALVE LVOT Vmax:   158.00 cm/s LVOT Vmean:  105.000 cm/s LVOT VTI:    0.305 m  AORTA Ao Root diam: 3.70 cm Ao Asc diam:  3.40 cm  MITRAL VALVE                         TRICUSPID VALVE Estimated RAP:  3.00 mmHg  MV Decel Time: 236 msec              SHUNTS MV E velocity: 104.00 cm/s 103 cm/s  Systemic VTI:  0.30 m MV A velocity: 80.00 cm/s  70.3 cm/s Systemic Diam: 2.00 cm MV E/A ratio:  1.30        1.5   Oneil Parchment MD Electronically signed by Oneil Parchment MD Signature Date/Time: 03/30/2019/11:46:00 AM    Final   TEE  ECHO TEE 04/02/2016  Interpretation Summary  Left ventricle: Normal cavity size. LV systolic function is mildly reduced with an EF of 45-50%. There are no obvious wall motion abnormalities.  Septum: No Patent Foramen  Ovale present.   Left atrium: Patent foramen ovale not present.  Left atrium: Cavity is mildly dilated.  Aortic valve: The valve is trileaflet. Mild valve thickening present. No stenosis. No regurgitation.  Mitral valve: Mild to moderate regurgitation.  Right ventricle: Normal ejection fraction. Normal tricuspid annular plane systolic excursion (TAPSE).  Tricuspid valve: Mild regurgitation. The tricuspid valve regurgitation jet is central.        ______________________________________________________________________________________________      Risk Assessment/Calculations           Physical Exam VS:  BP 126/66 (BP Location: Right Arm, Patient Position: Sitting, Cuff Size: Large)   Pulse 65   Ht 5' 8 (1.727 m)   Wt 240 lb (108.9 kg)   SpO2 97%   BMI 36.49 kg/m        Wt Readings from Last 3 Encounters:  07/07/24 240 lb (108.9 kg)  06/29/24 241 lb (109.3 kg)  12/17/23 249 lb (112.9 kg)    GEN: Well nourished, well developed in no acute distress NECK: No JVD; No carotid bruits CARDIAC: RRR, no murmurs, rubs, gallops RESPIRATORY:  Clear to auscultation without rales, wheezing or rhonchi  ABDOMEN: Soft, non-tender, non-distended EXTREMITIES:  No edema; No deformity   ASSESSMENT AND PLAN  Preop- Pending hernia surgery. According to the Revised Cardiac Risk Index (RCRI), his Perioperative Risk of Major Cardiac Event is (%): 6.6. His Functional Capacity in METs is: 6.36 according to the Duke Activity Status Index (DASI). Per AHA/ACC guidelines, he is deemed acceptable risk for the planned procedure without additional cardiovascular testing. Will route to surgical team so they are aware.  Recommend hold Mounjaro  one week prior to surgery. Would be permissible to hold aspirin  81mg  7 days prior to surgery - given hx of CABG if possible to continue throughout the periprocedural period would do so. 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 Recommend obtain clearance from [facility]  prior to hernia surgery.  CAD s/p CABG - Stable with no anginal symptoms. No indication for ischemic evaluation.  GDMT aspirin  81mg  daily, toprol  25mg  daily, atorvastatin80mg  daily. Recommend aiming for 150 minutes of moderate intensity activity per week and following a heart healthy diet.    HLD, LDL goal less than 70- Continue Atorvastatin  20mg  daily Update lipid panel today.  OSA- CPAP compliance encouraged.   DM2- continue mounjaro  7.5mg  weekly. Hold one week prior to surgery. A1c today       Dispo: follow up in 6 mos  Signed, Reche GORMAN Finder, NP   "

## 2024-07-07 NOTE — Patient Instructions (Addendum)
 HOLD ASPIRIN  AND MOUNJARO  ONE WEEK PRIOR TO YOUR UPCOMING PROCEDURE  Medication Instructions:  Your physician recommends that you continue on your current medications as directed. Please refer to the Current Medication list given to you today.  *If you need a refill on your cardiac medications before your next appointment, please call your pharmacy*  Lab Work: Today: A1c, Lipid panel  Testing/Procedures: bibe  Follow-Up: At [facility], you and your health needs are our priority.  As part of our continuing mission to provide you with exceptional heart care, our providers are all part of one team.  This team includes your primary Cardiologist (physician) and Advanced Practice Providers or APPs (Physician Assistants and Nurse Practitioners) who all work together to provide you with the care you need, when you need it.  Your next appointment:   6 month(s)  Provider:   Annabella Scarce, MD

## 2024-07-08 ENCOUNTER — Ambulatory Visit (HOSPITAL_BASED_OUTPATIENT_CLINIC_OR_DEPARTMENT_OTHER): Payer: Self-pay | Admitting: Family

## 2024-07-12 ENCOUNTER — Other Ambulatory Visit (HOSPITAL_BASED_OUTPATIENT_CLINIC_OR_DEPARTMENT_OTHER): Payer: Self-pay | Admitting: Cardiovascular Disease

## 2024-07-18 ENCOUNTER — Other Ambulatory Visit: Payer: Self-pay | Admitting: Family Medicine

## 2024-07-18 DIAGNOSIS — F419 Anxiety disorder, unspecified: Secondary | ICD-10-CM

## 2024-07-20 ENCOUNTER — Telehealth: Payer: Self-pay | Admitting: Vascular Surgery

## 2024-07-21 ENCOUNTER — Ambulatory Visit: Admitting: Vascular Surgery

## 2024-07-21 ENCOUNTER — Ambulatory Visit (HOSPITAL_COMMUNITY)

## 2024-07-21 ENCOUNTER — Other Ambulatory Visit: Payer: Self-pay | Admitting: Vascular Surgery

## 2024-07-21 DIAGNOSIS — I724 Aneurysm of artery of lower extremity: Secondary | ICD-10-CM

## 2024-07-27 ENCOUNTER — Ambulatory Visit (HOSPITAL_COMMUNITY)

## 2024-07-28 ENCOUNTER — Ambulatory Visit (HOSPITAL_COMMUNITY): Admission: RE | Admit: 2024-07-28 | Discharge: 2024-07-28 | Attending: Vascular Surgery

## 2024-07-28 ENCOUNTER — Encounter: Payer: Self-pay | Admitting: Vascular Surgery

## 2024-07-28 ENCOUNTER — Ambulatory Visit: Admitting: Vascular Surgery

## 2024-07-28 VITALS — BP 130/68 | HR 67 | Temp 98.2°F | Resp 20 | Ht 68.0 in | Wt 239.5 lb

## 2024-07-28 DIAGNOSIS — I724 Aneurysm of artery of lower extremity: Secondary | ICD-10-CM

## 2024-07-28 DIAGNOSIS — I723 Aneurysm of iliac artery: Secondary | ICD-10-CM | POA: Diagnosis not present

## 2024-07-28 LAB — VAS US ABI WITH/WO TBI
Left ABI: 1.04
Right ABI: 0.93

## 2024-07-30 ENCOUNTER — Other Ambulatory Visit (HOSPITAL_BASED_OUTPATIENT_CLINIC_OR_DEPARTMENT_OTHER): Payer: Self-pay | Admitting: Cardiovascular Disease

## 2024-07-31 NOTE — Progress Notes (Signed)
 Derrel Moore                                          MRN: [id number]   07/31/2024   The [facility] Quality Team Specialist reviewed this patient medical record for the purposes of chart review for care gap closure. The following were reviewed: chart review for care gap closure-controlling blood pressure.    [facility] Quality Team

## 2024-08-18 ENCOUNTER — Ambulatory Visit: Admitting: Vascular Surgery

## 2025-02-16 ENCOUNTER — Ambulatory Visit: Admitting: Vascular Surgery
